# Patient Record
Sex: Female | Born: 1955 | Race: White | Hispanic: No | Marital: Married | Smoking: Former smoker
Health system: Southern US, Community
[De-identification: ages and names within clinical notes are randomized; demographics above are authoritative.]

## PROBLEM LIST (undated history)

## (undated) DIAGNOSIS — K589 Irritable bowel syndrome without diarrhea: Secondary | ICD-10-CM

## (undated) DIAGNOSIS — J302 Other seasonal allergic rhinitis: Secondary | ICD-10-CM

## (undated) DIAGNOSIS — Z90721 Acquired absence of ovaries, unilateral: Secondary | ICD-10-CM

## (undated) DIAGNOSIS — Z90710 Acquired absence of both cervix and uterus: Secondary | ICD-10-CM

## (undated) DIAGNOSIS — R112 Nausea with vomiting, unspecified: Secondary | ICD-10-CM

## (undated) DIAGNOSIS — Z9889 Other specified postprocedural states: Secondary | ICD-10-CM

## (undated) DIAGNOSIS — D219 Benign neoplasm of connective and other soft tissue, unspecified: Secondary | ICD-10-CM

## (undated) DIAGNOSIS — K635 Polyp of colon: Secondary | ICD-10-CM

## (undated) DIAGNOSIS — C4491 Basal cell carcinoma of skin, unspecified: Secondary | ICD-10-CM

## (undated) DIAGNOSIS — K5790 Diverticulosis of intestine, part unspecified, without perforation or abscess without bleeding: Secondary | ICD-10-CM

## (undated) DIAGNOSIS — C801 Malignant (primary) neoplasm, unspecified: Secondary | ICD-10-CM

## (undated) HISTORY — DX: Diverticulosis of intestine, part unspecified, without perforation or abscess without bleeding: K57.90

## (undated) HISTORY — PX: EYE SURGERY: SHX253

## (undated) HISTORY — PX: WISDOM TOOTH EXTRACTION: SHX21

## (undated) HISTORY — PX: COLONOSCOPY: SHX174

## (undated) HISTORY — DX: Basal cell carcinoma of skin, unspecified: C44.91

## (undated) HISTORY — PX: ABDOMINAL HYSTERECTOMY: SHX81

## (undated) HISTORY — DX: Polyp of colon: K63.5

## (undated) HISTORY — PX: DILATION AND CURETTAGE OF UTERUS: SHX78

## (undated) HISTORY — DX: Irritable bowel syndrome, unspecified: K58.9

---

## 1998-07-17 ENCOUNTER — Other Ambulatory Visit: Admission: RE | Admit: 1998-07-17 | Discharge: 1998-07-17 | Payer: Self-pay | Admitting: *Deleted

## 1998-11-15 ENCOUNTER — Observation Stay (HOSPITAL_COMMUNITY): Admission: RE | Admit: 1998-11-15 | Discharge: 1998-11-15 | Payer: Self-pay | Admitting: *Deleted

## 1999-05-28 ENCOUNTER — Other Ambulatory Visit: Admission: RE | Admit: 1999-05-28 | Discharge: 1999-05-28 | Payer: Self-pay | Admitting: Obstetrics and Gynecology

## 1999-09-04 ENCOUNTER — Emergency Department (HOSPITAL_COMMUNITY): Admission: EM | Admit: 1999-09-04 | Discharge: 1999-09-04 | Payer: Self-pay | Admitting: Emergency Medicine

## 2000-06-02 ENCOUNTER — Other Ambulatory Visit: Admission: RE | Admit: 2000-06-02 | Discharge: 2000-06-02 | Payer: Self-pay | Admitting: Obstetrics and Gynecology

## 2001-07-01 ENCOUNTER — Other Ambulatory Visit: Admission: RE | Admit: 2001-07-01 | Discharge: 2001-07-01 | Payer: Self-pay | Admitting: Obstetrics and Gynecology

## 2002-06-14 ENCOUNTER — Encounter: Payer: Self-pay | Admitting: Obstetrics and Gynecology

## 2002-06-14 ENCOUNTER — Ambulatory Visit (HOSPITAL_COMMUNITY): Admission: RE | Admit: 2002-06-14 | Discharge: 2002-06-14 | Payer: Self-pay | Admitting: Obstetrics and Gynecology

## 2002-06-22 ENCOUNTER — Other Ambulatory Visit: Admission: RE | Admit: 2002-06-22 | Discharge: 2002-06-22 | Payer: Self-pay | Admitting: Obstetrics and Gynecology

## 2003-03-23 ENCOUNTER — Encounter (INDEPENDENT_AMBULATORY_CARE_PROVIDER_SITE_OTHER): Payer: Self-pay

## 2003-03-23 ENCOUNTER — Ambulatory Visit (HOSPITAL_COMMUNITY): Admission: RE | Admit: 2003-03-23 | Discharge: 2003-03-23 | Payer: Self-pay | Admitting: Obstetrics and Gynecology

## 2003-06-21 ENCOUNTER — Ambulatory Visit (HOSPITAL_COMMUNITY): Admission: RE | Admit: 2003-06-21 | Discharge: 2003-06-21 | Payer: Self-pay | Admitting: Obstetrics and Gynecology

## 2003-10-25 ENCOUNTER — Other Ambulatory Visit: Admission: RE | Admit: 2003-10-25 | Discharge: 2003-10-25 | Payer: Self-pay | Admitting: Obstetrics and Gynecology

## 2003-12-07 ENCOUNTER — Ambulatory Visit (HOSPITAL_COMMUNITY): Admission: RE | Admit: 2003-12-07 | Discharge: 2003-12-07 | Payer: Self-pay | Admitting: Gastroenterology

## 2004-07-09 ENCOUNTER — Ambulatory Visit (HOSPITAL_COMMUNITY): Admission: RE | Admit: 2004-07-09 | Discharge: 2004-07-09 | Payer: Self-pay | Admitting: Obstetrics and Gynecology

## 2005-08-10 ENCOUNTER — Ambulatory Visit (HOSPITAL_COMMUNITY): Admission: RE | Admit: 2005-08-10 | Discharge: 2005-08-10 | Payer: Self-pay | Admitting: Obstetrics and Gynecology

## 2006-08-17 ENCOUNTER — Ambulatory Visit (HOSPITAL_COMMUNITY): Admission: RE | Admit: 2006-08-17 | Discharge: 2006-08-17 | Payer: Self-pay | Admitting: Obstetrics and Gynecology

## 2006-09-22 ENCOUNTER — Ambulatory Visit (HOSPITAL_COMMUNITY): Admission: RE | Admit: 2006-09-22 | Discharge: 2006-09-22 | Payer: Self-pay | Admitting: Gastroenterology

## 2006-09-22 ENCOUNTER — Encounter (INDEPENDENT_AMBULATORY_CARE_PROVIDER_SITE_OTHER): Payer: Self-pay | Admitting: Specialist

## 2007-02-05 ENCOUNTER — Emergency Department (HOSPITAL_COMMUNITY): Admission: EM | Admit: 2007-02-05 | Discharge: 2007-02-05 | Payer: Self-pay | Admitting: Emergency Medicine

## 2007-07-20 ENCOUNTER — Encounter (INDEPENDENT_AMBULATORY_CARE_PROVIDER_SITE_OTHER): Payer: Self-pay | Admitting: Obstetrics and Gynecology

## 2007-07-20 ENCOUNTER — Ambulatory Visit (HOSPITAL_COMMUNITY): Admission: RE | Admit: 2007-07-20 | Discharge: 2007-07-20 | Payer: Self-pay | Admitting: Vascular Surgery

## 2007-08-31 ENCOUNTER — Ambulatory Visit (HOSPITAL_COMMUNITY): Admission: RE | Admit: 2007-08-31 | Discharge: 2007-08-31 | Payer: Self-pay | Admitting: Obstetrics and Gynecology

## 2008-03-21 ENCOUNTER — Encounter: Admission: RE | Admit: 2008-03-21 | Discharge: 2008-04-24 | Payer: Self-pay | Admitting: Internal Medicine

## 2008-09-04 ENCOUNTER — Ambulatory Visit (HOSPITAL_COMMUNITY): Admission: RE | Admit: 2008-09-04 | Discharge: 2008-09-04 | Payer: Self-pay | Admitting: Obstetrics and Gynecology

## 2009-09-06 ENCOUNTER — Ambulatory Visit (HOSPITAL_COMMUNITY): Admission: RE | Admit: 2009-09-06 | Discharge: 2009-09-06 | Payer: Self-pay | Admitting: Obstetrics and Gynecology

## 2010-05-06 ENCOUNTER — Ambulatory Visit: Payer: Self-pay | Admitting: Sports Medicine

## 2010-05-06 DIAGNOSIS — M2020 Hallux rigidus, unspecified foot: Secondary | ICD-10-CM | POA: Insufficient documentation

## 2010-05-06 DIAGNOSIS — M79609 Pain in unspecified limb: Secondary | ICD-10-CM | POA: Insufficient documentation

## 2010-05-06 DIAGNOSIS — R269 Unspecified abnormalities of gait and mobility: Secondary | ICD-10-CM | POA: Insufficient documentation

## 2010-06-11 ENCOUNTER — Ambulatory Visit: Payer: Self-pay | Admitting: Sports Medicine

## 2010-07-23 ENCOUNTER — Ambulatory Visit (HOSPITAL_COMMUNITY)
Admission: RE | Admit: 2010-07-23 | Discharge: 2010-07-23 | Payer: Self-pay | Source: Home / Self Care | Attending: Obstetrics and Gynecology | Admitting: Obstetrics and Gynecology

## 2010-08-21 ENCOUNTER — Other Ambulatory Visit (HOSPITAL_COMMUNITY): Payer: Self-pay | Admitting: Obstetrics and Gynecology

## 2010-08-21 DIAGNOSIS — Z0000 Encounter for general adult medical examination without abnormal findings: Secondary | ICD-10-CM

## 2010-09-02 NOTE — Assessment & Plan Note (Signed)
Summary: FEET PAIN,MC   Vital Signs:  Patient profile:   55 year old female Height:      66 inches Weight:      153 pounds BMI:     24.78 Pulse rate:   74 / minute BP sitting:   129 / 86  (right arm)  Vitals Entered By: Rochele Pages RN (May 06, 2010 9:59 AM)  History of Present Illness: Pleasant lady who has found running to be her most efficient sport Hx of doing a lot of biking started running and had better fitness and weight loss at first tried up to 6 mi but had leg fatigue for 2 to 3 days p long runs now does 3 mi about 6x per week  major issue is RT great toe pain some but not as much on left  comes for evalutioon of this  Physical Exam  General:  Well-developed,well-nourished,in no acute distress; alert,appropriate and cooperative throughout examination Msk:  RT midfoot has moderate pronation at rest shift is at midfoot rear foot is normal early bunion on RT with limited toe dorsiflexion to 20 deg and PF to 20 deg left great toe shows 45 deg of ext and 35 deg of flex left great toe also shows only mild deviation at 1 MTP Rt MTP1 shows abnormal callus along side  walking gait is abnormal with pronation Rt > LT  hip strength good quad strength good leg lennght equal alignment good   Impression & Recommendations:  Problem # 1:  FOOT PAIN, RIGHT (ICD-729.5) primarily from great toe no PF or general arch pain long arch collapse on RT and some early transverse  trial of top ketoprofen gel to great toe MTP  Problem # 2:  HALLUX RIGIDUS, ACQUIRED (ICD-735.2) work motion nees to use custom orthotic to prevent bunion   Problem # 3:  ABNORMALITY OF GAIT (ICD-781.2) must try to control deg of pronation if she is to cont succesful running  plan to bring shoes, running shorts, do gait eval probalby custom orthotics  Complete Medication List: 1)  Ketoprofen 20% Gel  .... Aaa qid  Patient Instructions: 1)  [facility]* 2)  [address])  [place] 4)  [place], [place]  [address]

## 2010-09-02 NOTE — Assessment & Plan Note (Signed)
Summary: ORTHOTICS,MC   Vital Signs:  Patient profile:   55 year old female BP sitting:   134 / 78  Vitals Entered By: Lillia Pauls CMA (June 11, 2010 5:08 PM)   History of Present Illness: [name] returns for foot and toe pain ketoprofen gel really helped grt toe pain runs regularly gets foot pain over medial area and bunion on RT  wants to try custom orthotics to lessen pain  Preventive Screening-Counseling & Management  Alcohol-Tobacco     Smoking Status: never  Allergies (verified): No Known Drug Allergies  Social History: Smoking Status:  never  Physical Exam  General:  Well-developed,well-nourished,in no acute distress; alert,appropriate and cooperative throughout examination Msk:  mild bunion change on RT mild preservatin of arch  On left there is collapse of long arch pronated at rest  Gait is pronated bilat more midfoot left and forefoot RT   Impression & Recommendations:  Problem # 1:  ABNORMALITY OF GAIT (ICD-781.2)  Patient was fitted for a standard, cushioned, semi-rigid orthotic.  The orthotic was heated and the patient stood on the orthotic blank positioned on the orthotic stand. The patient was positioned in subtalar neutral position and 10 degrees of ankle dorsiflexion in a weight bearing stance. After completion of molding a stable based was applied to the orthotic blank.   The blank was ground to a stable position for weight bearing. size 7 blue swirl base med blue EVA posting  first ray RT additional orthotic padding  none  time 30 mins  gait is neutral w good control of pronation p completion she feels very comforatble walking and running in these  Orders: [employer] Materials, each unit (L3002)  Problem # 2:  FOOT PAIN, RIGHT (ICD-729.5) Assessment: Improved  improved  keep up ketoprofen gel as needed  Orders: [employer]otic [employer], each unit (Z6109)  Problem # 3:  HALLUX RIGIDUS, ACQUIRED (ICD-735.2)  use 1st ray  post  work gentle motion  reck prn  Orders: Games developer, each unit (L3002)  Complete Medication List: 1)  Ketoprofen 20% Gel  .... Aaa qid   Orders Added: 1)  Est. Patient Level III [60454] 2)  Orthotic Materials, each unit [L3002]

## 2010-09-09 ENCOUNTER — Ambulatory Visit (HOSPITAL_COMMUNITY)
Admission: RE | Admit: 2010-09-09 | Discharge: 2010-09-09 | Disposition: A | Discharge status: Home / Self Care | Payer: 59 | Source: Ambulatory Visit | Attending: Obstetrics and Gynecology | Admitting: Obstetrics and Gynecology

## 2010-09-09 ENCOUNTER — Ambulatory Visit (HOSPITAL_COMMUNITY): Admission: RE | Admit: 2010-09-09 | Payer: Self-pay | Source: Home / Self Care | Admitting: Obstetrics and Gynecology

## 2010-09-09 DIAGNOSIS — Z0000 Encounter for general adult medical examination without abnormal findings: Secondary | ICD-10-CM

## 2010-09-09 DIAGNOSIS — Z1231 Encounter for screening mammogram for malignant neoplasm of breast: Secondary | ICD-10-CM | POA: Insufficient documentation

## 2010-10-03 NOTE — Op Note (Signed)
NAMEPANAGIOTA, PERFETTI                ACCOUNT NO.:  [id number]  MEDICAL RECORD NO.:  [id number]          PATIENT TYPE:  AMB  LOCATION:  SDC                           [facility]  PHYSICIAN:  Annye Forrey A. Naria Abbey, M.D.DATE OF BIRTH:  Jul 30, 1956  DATE OF PROCEDURE:  07/23/2010 DATE OF DISCHARGE:  07/23/2010                              OPERATIVE REPORT   PREOPERATIVE DIAGNOSES:  Postmenopausal bleeding and thickened endometrium.  POSTOPERATIVE DIAGNOSIS:  Postmenopausal bleeding and thickened endometrium.  PROCEDURES:  Dilation and curettage and hysteroscopy.  SURGEON:  Ashlen Kiger A. Rosalio Macadamia, M.D.  ANESTHESIA:  General.  INDICATIONS:  This is a 55 year old G1, P1-0-0-1 woman who has had no vaginal bleeding until she changed her hormones, this past year.  She has had 2 episodes of bright red bleeding in the past 4 months.  The patient has had a previous D and C hysteroscopy with resectoscope x2 and has had known fibroids during these procedures.  The patient had a sonohysterogram which revealed a thickened area on her endometrium for which she is brought to the operating room today for a D and C hysteroscopy with resectoscope again.  FINDINGS:  Retroverted to mid position uterus with some irregularity consistent with fibroids.  No adnexal mass.  Endometrium with a thickened area on the anterior wall but everything consistent with benign tissue.  PROCEDURE:  The patient was brought into the operating room, given adequate general anesthesia.  She was placed in a dorsal lithotomy position.  Her perineum was washed with Betadine and vagina was washed with Betadine.  She was draped in sterile fashion.  Speculum was placed in the vagina.  Paracervical block was administered with 1% Nesacaine. Anterior lip of the cervix was grasped with a single-tooth tenaculum. Cervix was sounded.  Cervix was dilated with Shawnie Pons dilators initially to approximately 20 to 23.  A hysteroscope was  introduced into the endometrial cavity and was brought up to the top of the uterus and the corners could be visualized when this was done, the anterior portion of the uterus was seen to be irregular with some thickening.  The decision was made to use a resectoscope.  Therefore, this simple hysteroscope was removed.  The resectoscope was placed and the cervix was dilated a little bit more.  The small resectoscope was then introduced into the endometrial cavity.  However during this period of time, there was some bleeding within the endometrial cavity from the previous hysteroscopy and the blood clots were attempted to be removed through just flushing it out with the fluid and trying to remove it with the hysteroscope, however, the blood clot could never totally be removed.  There was not good visualization to use the resectoscope at any time.  Therefore, it was felt that it was not safe to use the resectoscope.  Therefore, the resectoscope was removed and a sharp curettage was performed instead. There was no large amount of tissue.  There was only small amount of tissue removed and this tissue was sent to pathology.  All instruments were removed from the vagina.  The patient was taken out of the dorsal lithotomy position.  She was awakened.  She was moved from the operating table to a stretcher in stable condition.  Complications were none. Estimated blood loss less than 5 mL.  Complications none.  Glycol differential approximately 200 mL.     Arneda Sappington A. Rosalio Macadamia, M.D.     SAD/MEDQ  D:  07/23/2010  T:  07/24/2010  Job:  [id number]  Electronically Signed by Floyde Parkins M.D. on 10/03/2010 12:35:38 PM

## 2010-10-13 LAB — CBC
HCT: 39.1 % (ref 36.0–46.0)
Hemoglobin: 13 g/dL (ref 12.0–15.0)
MCH: 29.6 pg (ref 26.0–34.0)
MCHC: 33.2 g/dL (ref 30.0–36.0)
MCV: 89.1 fL (ref 78.0–100.0)
Platelets: 266 10*3/uL (ref 150–400)
RBC: 4.39 MIL/uL (ref 3.87–5.11)
RDW: 12.9 % (ref 11.5–15.5)
WBC: 5.1 10*3/uL (ref 4.0–10.5)

## 2010-10-22 ENCOUNTER — Ambulatory Visit (INDEPENDENT_AMBULATORY_CARE_PROVIDER_SITE_OTHER): Payer: 59 | Admitting: Sports Medicine

## 2010-10-22 ENCOUNTER — Encounter: Payer: Self-pay | Admitting: Sports Medicine

## 2010-10-22 VITALS — BP 120/78 | Ht 66.0 in | Wt 157.0 lb

## 2010-10-22 DIAGNOSIS — M79609 Pain in unspecified limb: Secondary | ICD-10-CM

## 2010-10-22 DIAGNOSIS — R269 Unspecified abnormalities of gait and mobility: Secondary | ICD-10-CM

## 2010-10-22 DIAGNOSIS — M2020 Hallux rigidus, unspecified foot: Secondary | ICD-10-CM

## 2010-10-22 NOTE — Assessment & Plan Note (Signed)
Since most of her pain is over her first MTP joint I modified her orthotic to take and cut down on the degree of padding under this joint. Once that was completed she felt that this was more comfortable with running and she had less tendency to limp on push off of her right foot.

## 2010-10-22 NOTE — Progress Notes (Signed)
  Subjective:    Patient ID: [name], female    DOB: May 31, 1956, 55 y.o.   MRN: [id number]  HPI  this patient received orthotics in October. They were done because she has significant hallux rigidus. Most of her pain is in her right foot that she had some in both feet. She has become a runner and is enjoying the training period recently she decided to run a half marathon and when she started increasing her mileage the pain in her right great toe area has increased.   Currently she can run up to 4 miles without a lot of pain but now that she has gotten her long run up to 9 miles she'll have a lot of pain in her right great. Soaking in hot water has been helpful. She has been using topical ketoprofen gel and has also used both Mobic and ibuprofen with some relief of pain.   Of note on her right orthotic we had placed a first Ray post.    Review of Systems     Objective:   Physical Exam  the patient has preservation of her long arch bilaterally. On the right foot there is a moderate bunion with hypertrophic change at the first MTP joint. The left foot has only very minimal change at the first MTP joint.   Today there is no tenderness to palpation. However on the right foot she has only about 20 of extension and 25 of flexion. On the left foot she has 30 of extension and 35 of flexion. These are similar to measurements from her last visit.    her walking and running gait reveals pronation at the forefoot when she is barefoot. In her orthotics she corrects the pronation and has a neutral walking and running gait.       Assessment & Plan:          Problem List as of 10/22/2010        FOOT PAIN, RIGHT   Last Assessment & Plan Note   10/22/2010 Office Visit Signed 10/22/2010  2:45 PM by Enid Baas     Since most of her pain is over her first MTP joint I modified her orthotic to take and cut down on the degree of padding under this joint. Once that was completed she felt that this  was more comfortable with running and she had less tendency to limp on push off of her right foot.    HALLUX RIGIDUS, ACQUIRED   Last Assessment & Plan Note   10/22/2010 Office Visit Signed 10/22/2010  2:46 PM by Enid Baas     She should continue to work on her range of motion and was given a series of exercises of walking on her toes heels and backwards to help stretch out the first MTP joints bilaterally. In addition after each run she should soak the right first MTP and his water for 5-10 minutes    ABNORMALITY OF GAIT   Last Assessment & Plan Note   10/22/2010 Office Visit Signed 10/22/2010  2:51 PM by Enid Baas     She should continue to use the orthotics as they did correct her gait problems. She will try the modification we have made today and if this works well we'll continue to use the orthotic as is. Should her pain return she'll return to our clinic for further modification

## 2010-10-22 NOTE — Assessment & Plan Note (Signed)
She should continue to work on her range of motion and was given a series of exercises of walking on her toes heels and backwards to help stretch out the first MTP joints bilaterally. In addition after each run she should soak the right first MTP and his water for 5-10 minutes

## 2010-10-22 NOTE — Assessment & Plan Note (Signed)
She should continue to use the orthotics as they did correct her gait problems. She will try the modification we have made today and if this works well we'll continue to use the orthotic as is. Should her pain return she'll return to our clinic for further modification

## 2010-12-16 NOTE — Op Note (Signed)
[name], [name]                ACCOUNT NO.:  [id number]   MEDICAL RECORD NO.:  [id number]          PATIENT TYPE:  AMB   LOCATION:  SDC                           F[facility]ILITY:  [facility]   PHYSICIAN:  Sherry A. Dickstein, M.D.DATE OF BIRTH:  Apr 12, 1956   DATE OF PROCEDURE:  07/20/2007  DATE OF DISCHARGE:                               OPERATIVE REPORT   PREOPERATIVE DIAGNOSES:  1. Postmenopausal bleeding.  2. Submucosal fibroids.   POSTOPERATIVE DIAGNOSES:  1. Postmenopausal bleeding.  2. Submucosal fibroids.   PROCEDURE:  D&C, hysteroscopy with resectoscope.   SURGEON:  Sherry A. Rosalio Macadamia, M.D.   ANESTHESIA:  General.   INDICATIONS FOR PROCEDURE:  This is a 55 year old G1, P1-0-0-1 woman who  was started on hormone replacement therapy after which she started to  have some irregular bleeding.  The patient had an ultrasound which  revealed presumed fibroids.  She came for a sonohistogram which revealed  submucosal fibroids.  The patient gives a history of excessive flow with  her menstrual periods when she was having regular periods prior to  menopause.  The patient has had known fibroids in the past with previous  submucosal fibroids and resections of submucosal fibroids approximately  3 years ago and approximately 8 years ago.  The patient is not  interested in having a hysterectomy and would like to have a repeat D&C,  hysteroscopy with the resectoscope.  The patient understands that she  has an increased risk of perforation, having had this done several times  in the past.   FINDINGS:  Anteflexed, slightly enlarged uterus with no adnexal mass.  Submucosal fibroids present and very small endometrial polyps present.   DESCRIPTION OF PROCEDURE:  The patient was brought into the operating  room and given adequate general anesthesia.  She was placed in the  dorsal lithotomy position.  Her perineum and then her vagina were washed  with Betadine.  A paracervical block was  administered with 1% Nesacaine.  The anterior lip of the cervix was grasped wit a single-tooth tenaculum.  The cervix was infiltrated with Pitressin in a 20 units to 50 mL  dilution, and 20 mL of this dilution was used.  The cervix was then  dilated with Shawnie Pons dilators to a #31.  The hysteroscope was attempted to  be introduced, but there was some difficulty introducing it, and then  the cervix was dilated to a #33.  The hysteroscope was then easily  introduced.  Pictures were obtained.  A large submucosal fibroid was  visualized.  Using a single loop resector, the fibroid was removed in  sheets of tissue.  After each cautery, the large sheet of tissue was  attempted to be removed individually.  There were some endometrial  polyps present.  These were very small, but they were removed in  sections.  There was another small submucosal fibroid present that was  removed separately.  Once all the tissue was removed and it was felt  that no further tissue could be removed safely, pictures were obtained.  Adequate hemostasis was present.  There was no known perforation  performed during this procedure.  All tissue had been removed from the  uterus.  All instruments were removed from the vagina.   The patient was taken out of the dorsal lithotomy position.  She was  awakened.  She was extubated.  She was moved from the operating table to  a stretcher in stable condition.   COMPLICATIONS:  None.   ESTIMATED BLOOD LOSS:  Less than 5 mL.   SORBITOL DIFFERENTIAL:  Less than 100 mL.   SPECIMENS:  1. Endometrial resections.  2. Endometrial polyps which went to pathology.      Sherry A. Rosalio Macadamia, M.D.  Electronically Signed     SAD/MEDQ  D:  07/20/2007  T:  07/21/2007  Job:  [id number]

## 2010-12-19 NOTE — H&P (Signed)
NAME:  [name], [name]                          ACCOUNT NO.:  [id number]   MEDICAL RECORD NO.:  [id number]                   PATIENT TYPE:  AMB   LOCATION:  SDC                                  [facility]   PHYSICIAN:  Richardean Sale, M.D.                DATE OF BIRTH:  May 19, 1956   DATE OF ADMISSION:  03/23/2003  DATE OF DISCHARGE:                                HISTORY & PHYSICAL   CHIEF COMPLAINT:  Irregular bleeding and heavy menses.   HISTORY OF PRESENT ILLNESS:  This is a 55 year old, gravida 1, para 1, white  female with a complaint of heavy irregular bleeding. The patient states that  she has a known history of fibroids and approximately three months ago,  noted that her periods were becoming irregular and they were increasing in  flow and lasting up to seven days. 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   She has had no problem with her periods up until the past three months. The  patient underwent endometrial biopsy which was negative for atypia or  hyperplasia, and also underwent a sonohysterogram which revealed an  endometrial polyp measuring 2 x 1.7 cm and submucous fibroid 1.7 x 1.3 cm as  well as multiple intramural fibroids.   PAST MEDICAL HISTORY:  1. Uterine fibroids.  2. Seasonal allergies.   PAST SURGICAL HISTORY:  Hysteroscopic resection of leiomyoma in April 2000.   SOCIAL HISTORY:  She is married. No use of tobacco or drugs. Occasionally  drinks alcohol socially. Exercises regularly.   FAMILY HISTORY:  Maternal grandfather with cardiovascular disease. Maternal  grandmother with diabetes. No history of breast, uterine, or ovarian  cancers.   REVIEW OF SYSTEMS:  All negative with the exception as noted in the HPI.   PHYSICAL EXAMINATION:  VITAL SIGNS:  Blood pressure 110/60, pulse 76. Last  menstrual period March 06, 2003.  GENERAL:  She is a well-developed, well-nourished white female in no acute  distress who appears her stated age.  HEART:  Regular.  LUNGS:  Clear to auscultation bilaterally.  ABDOMEN:  Soft. Nontender and nondistended with no masses, no hernia. Liver  and spleen are normal.  PELVIC:  Cervix appears normal. It is mobile and nontender. Uterus is in the  midline and anteverted, approximately 8-10 week size. No adnexal masses are  appreciated. Bladder, urethra, and urethral meatus are all normal.   MEDICATIONS:  1. Allegra daily.  2. Vioxx p.r.n. back pain.  3. Agestin 5 mg p.o. daily.   ALLERGIES:  None.   ASSESSMENT:  This is a 55 year old white female with menometorrhagia,  anemia, submucosal fibroids, and possible endometrial polyp on  sonohysterogram with negative endometrial biopsy.   PLAN:  Will proceed with hysteroscopy with resection of endometrial polyps  and/or submucosal fibroids. The  risks, benefits, and alternatives of the  procedure were reviewed with the patient. Informed consent was obtained. The  patient understands that there is a risk of uterine perforation, requiring a  transfusion for significant bleeding, cessation of the hysteroscopy and  possible laparotomy to evaluate for damage to bowel or bladder should  perforation occur. She also understands the risks of infection and  anesthetic related complications. The patient voices understanding of these  risks and agrees to proceed.                                               Richardean Sale, M.D.    JW/MEDQ  D:  03/20/2003  T:  03/20/2003  Job:  [id number]

## 2010-12-19 NOTE — Op Note (Signed)
NAME:  [name], [name]                          ACCOUNT NO.:  [id number]   MEDICAL RECORD NO.:  [id number]                   PATIENT TYPE:  AMB   LOCATION:  ENDO                                 FACILITY:  [facility]   PHYSICIAN:  Anselmo Rod, M.D.               DATE OF BIRTH:  August 09, 1955   DATE OF PROCEDURE:  12/07/2003  DATE OF DISCHARGE:                                 OPERATIVE REPORT   PROCEDURE PERFORMED:  Colonoscopy.   ENDOSCOPIST:  Anselmo Rod, M.D.   INSTRUMENT USED:  Olympus video colonoscope.   INDICATIONS FOR PROCEDURE:  A 55 year old white female with a history of  rectal bleeding, rule out colonic polyps, masses, etc.   PREPROCEDURE PREPARATION:  Informed consent was procured from the patient.  The patient was fasted for 8 hours prior to the procedure and prepped with a  bottle of magnesium citrate and a gallon of GoLYTELY the night prior to the  procedure.   PREPROCEDURE PHYSICAL EXAMINATION:  VITAL SIGNS:  The patient with stable  vital signs.  NECK:  Supple.  CHEST:  Clear to auscultation.  CARDIAC:  S1, S2, regular.  ABDOMEN:  Soft with normal bowel sounds.   DESCRIPTION OF PROCEDURE:  The patient was placed in the left lateral  decubitus position, sedated with 100 mg of Demerol and 10 mg of Versed in  slow incremental doses.  Once the patient was adequately sedated and  maintained on low-flow oxygen and continuous cardiac monitoring, the Olympus  video colonoscope was advanced from the rectum to the cecum.  The  appendiceal orifice and the ileocecal valve were clearly visualized and  photographed.  The terminal ileum appeared healthy and without lesion.  Small internal hemorrhoids were seen on retroflexion.  No masses, polyps,  erosions, ulcerations, or diverticula were seen.  The patient had some  abdominal discomfort with insufflation of air into the colon, indicating  component of visceral hypersensitivity.  The patient tolerated the procedure  well  without immediate complications.   IMPRESSION:  1. Normal colonoscopy up to the terminal ileum, except for small internal     hemorrhoids.  2. No masses, polyps, or diverticulosis seen.  3. Abdominal discomfort with insufflation of air into the colon indicating a     component of visceral hypersensitivity.   RECOMMENDATIONS:  1. Continue a high fiber diet with liberal fluid intake.  2. Outpatient follow-up in the next 2 weeks for further recommendations.  3. Repeat colorectal screening in the next 10 years unless the patient     develops any abnormal symptoms in the interim.                                               Anselmo Rod, M.D.    JNM/MEDQ  D:  12/08/2003  T:  12/09/2003  Job:  [id number]   cc:   Larina Earthly, M.D.  [address]  [place]  [place] [address]  Fax: [phone]

## 2010-12-19 NOTE — Op Note (Signed)
NAMELAURENCIA, [name]                ACCOUNT NO.:  [id number]   MEDICAL RECORD NO.:  [id number]          PATIENT TYPE:  AMB   LOCATION:  ENDO                         FACILITY:  [facility]   PHYSICIAN:  Anselmo Rod, M.D.  DATE OF BIRTH:  Aug 01, 1956   DATE OF PROCEDURE:  09/22/2006  DATE OF DISCHARGE:                               OPERATIVE REPORT   PROCEDURE PERFORMED:  Colonoscopy with cold biopsy times one and snare  polypectomy times one.   ENDOSCOPIST:  Anselmo Rod, M.D.   INSTRUMENT USED:  Pentax video colonoscope.   INDICATIONS FOR PROCEDURE:  55 year old white female with a family  history of colon cancer in her mother undergoing colonoscopy for ongoing  rectal bleeding, rule out colonic polyps, masses, etc.   PREPROCEDURE PREPARATION:  Informed consent was procured from the  patient.  The patient fasted for 4 hours prior to the procedure and  prepped with 20 Osmoprep pills the night of and 12 Osmoprep pills the  morning of the procedure. The risks and benefits of the procedure  including a 10% miss rate of cancer and polyp were discussed with her as  well.   PREPROCEDURE PHYSICAL:  The patient had stable vital signs.  Neck  supple.  Chest clear to auscultation.  S1, S2 regular.  Abdomen soft  with normal bowel sounds.   DESCRIPTION OF PROCEDURE:  The patient was placed in left lateral  decubitus position and sedated with 125 mcg of fentanyl and 10 mg of  Versed given intravenously in slow incremental doses.  Once the patient  was adequately sedated and maintained on low-flow oxygen and continuous  cardiac monitoring, the Pentax video colonoscope was advanced from the  rectum to the cecum.  Multiple washes were done.  The appendiceal  orifice and cecum were clearly visualized and photographed.  The  terminal ileum appeared healthy without lesions.  A small sessile polyp  was biopsied from the cecum (cold biopsies times one).  Another small  polyp 3-4 mm in size,  sessile in nature, was snared from 20 cm. There  was no evidence of hemorrhoids or anal fissures, perianal erosions were  noted of unclear significance. The patient had significant abdominal  discomfort with insufflation of air into the colon indicating a  component of visceral hypersensitivity consistent with IBS. The patient  tolerated the procedure well without complications.   IMPRESSION:  1. Perianal erosions.  2. Small sessile polyp removed by hot snare from 20 cm and another      small sessile polyp biopsied in the cecum (cold biopsies X 1).  3. No evidence of diverticulosis.  4. No hemorrhoids or fissures seen.  5. Visceral hypersensitivity appreciated with insufflation of air into      the colon consistent with IBS.  6. Normal terminal ileum.   RECOMMENDATIONS:  1. Await pathology results.  2. Avoid all nonsteroidals including aspirin for the next 2 weeks.  3. Repeat colonoscopy depending on pathology results.  4. Acyclovir cream has been called into the patient's pharmacy for      rectal application two to three times  a day.  She is to apply this      externally and follow-up in the office in the next 2 weeks.  Her      response to this will be monitored and further recommendations made      accordingly.      Anselmo Rod, M.D.  Electronically Signed     JNM/MEDQ  D:  09/22/2006  T:  09/22/2006  Job:  [id number]   cc:   Adolph Pollack, M.D.  Richardean Sale, M.D.  Larina Earthly, M.D.

## 2010-12-19 NOTE — Op Note (Signed)
NAME:  [name], [name]                          ACCOUNT NO.:  [id number]   MEDICAL RECORD NO.:  [id number]                   PATIENT TYPE:  AMB   LOCATION:  SDC                                  FACILITY:  [facility]   PHYSICIAN:  Richardean Sale, M.D.                DATE OF BIRTH:  1955/10/29   DATE OF PROCEDURE:  03/23/2003  DATE OF DISCHARGE:                                 OPERATIVE REPORT   PREOPERATIVE DIAGNOSES:  1. Menometrorrhagia.  2. Submucosal uterine fibroid, possible endometrial polyp.   POSTOPERATIVE DIAGNOSES:  1. Menometrorrhagia.  2. Submucosal fibroids x2.   PROCEDURE:  Hysteroscopy with resection of submucosal fibroid.   SURGEON:  Richardean Sale, M.D.   ASSISTANT:  Cordelia Pen A. Rosalio Macadamia, M.D.   ANESTHESIA:  General.   COMPLICATIONS:  None.   ESTIMATED BLOOD LOSS:  Minimal.   FLUID DEFICIT:  400 mL.   FINDINGS:  Two 2-cm submucosal uterine fibroids, one on the anterior and  right aspect of the uterine cavity and the other on the left lateral aspect  of the uterine cavity.   PATHOLOGY SPECIMENS:  Resected uterine fibroids.   INDICATIONS FOR PROCEDURE:  This is a 55 year old white female with a three  month history of menometrorrhagia with anemia with hemoglobin down to 10.  The patient had had uterine fibroids in the past and underwent endometrial  biopsy which was negative for any atypia or hyperplasia or malignancy and  then subsequently underwent a sonohysterogram which revealed submucosal  fibroid.  Given the patient's anemia and her significant bleeding, we  discussed her options and she elected to proceed with hysteroscopic  resection of the fibroid.  Prior to the procedure the risks and benefits of  the procedure including, but not limited to, uterine perforation requiring  laparoscopy, possible damage to the bowel or bladder or other intra-  abdominal organs, possible hemorrhage necessitating transfusion, infection,  or anesthetic risk.  The patient  voiced understanding of all these risks,  consent was signed, and she agreed to proceed.  All questions were answered.   PROCEDURE:  The patient was taken in the operating room and given general  anesthesia, was prepped and draped in the usual sterile fashion, and placed  in the dorsal lithotomy position.  A bivalve speculum was then placed in the  vagina and 2 mL of Nesacaine was injected at the 12 o'clock position.  A  single-tooth tenaculum was then used to grasp the anterior lip of the cervix  and additional Nesacaine was used at the 4 o'clock, 5 o'clock, 7 o'clock,  and 8 o'clock positions with 6 mL injected in each site along with  cervicovaginal junction for postoperative analgesia.  The uterus was then  sounded to 9 cm.  The cervix was then dilated with the [facility] dilators and  the hysteroscope was introduced.  Findings included the two 2-cm submucosal  fibroids.  These were then  resected with the resectoscope with sorbitol used  to distend the uterine cavity.  During the procedure the plastic guard over  the end of the hysteroscope became dislodged.  This was very easily removed  from the uterine cavity with the resectoscope.  At this point a new  resectoscope was then used for the remaining portion of the procedure.  The  remaining portion of the fibroids were resected without difficulty.  The  uterine cavity was then explored.  There was no evidence of any active  bleeding.  All instruments were then removed from the patient's vagina.  The  cervix was inspected and there was very minimal bleeding noted from the  tenaculum site.  Total fluid deficit was 400 mL.   The patient tolerated the procedure well.  She was taken out of the dorsal  lithotomy position and awakened from anesthesia and transferred to the  recovery room.  Sponge, lap, needle, and instrument counts were all correct  x2.                                               Richardean Sale, M.D.    JW/MEDQ  D:   03/23/2003  T:  03/24/2003  Job:  [id number]

## 2011-05-08 LAB — CBC
HCT: 35 — ABNORMAL LOW
Hemoglobin: 12.1
MCHC: 34.5
MCV: 88.4
Platelets: 364
RBC: 3.97
RDW: 12.7
WBC: 5.4

## 2011-06-22 ENCOUNTER — Other Ambulatory Visit: Payer: Self-pay | Admitting: *Deleted

## 2011-06-22 MED ORDER — KETOPROFEN POWD: Status: DC

## 2011-07-10 ENCOUNTER — Other Ambulatory Visit: Payer: Self-pay | Admitting: Dermatology

## 2011-08-25 ENCOUNTER — Other Ambulatory Visit (HOSPITAL_COMMUNITY): Payer: Self-pay | Admitting: Obstetrics and Gynecology

## 2011-08-25 DIAGNOSIS — Z1231 Encounter for screening mammogram for malignant neoplasm of breast: Secondary | ICD-10-CM

## 2011-09-21 ENCOUNTER — Ambulatory Visit (HOSPITAL_COMMUNITY)
Admission: RE | Admit: 2011-09-21 | Discharge: 2011-09-21 | Disposition: A | Discharge status: Home / Self Care | Payer: 59 | Source: Ambulatory Visit | Attending: Obstetrics and Gynecology | Admitting: Obstetrics and Gynecology

## 2011-09-21 DIAGNOSIS — Z1231 Encounter for screening mammogram for malignant neoplasm of breast: Secondary | ICD-10-CM | POA: Insufficient documentation

## 2011-09-24 ENCOUNTER — Other Ambulatory Visit: Payer: Self-pay | Admitting: Obstetrics and Gynecology

## 2011-09-24 DIAGNOSIS — R928 Other abnormal and inconclusive findings on diagnostic imaging of breast: Secondary | ICD-10-CM

## 2011-09-25 ENCOUNTER — Ambulatory Visit
Admission: RE | Admit: 2011-09-25 | Discharge: 2011-09-25 | Disposition: A | Discharge status: Home / Self Care | Payer: 59 | Source: Ambulatory Visit | Attending: Obstetrics and Gynecology | Admitting: Obstetrics and Gynecology

## 2011-09-25 DIAGNOSIS — R928 Other abnormal and inconclusive findings on diagnostic imaging of breast: Secondary | ICD-10-CM

## 2011-09-28 ENCOUNTER — Other Ambulatory Visit: Payer: 59

## 2011-10-05 ENCOUNTER — Other Ambulatory Visit: Payer: 59

## 2012-02-15 ENCOUNTER — Encounter (HOSPITAL_COMMUNITY): Admission: RE | Payer: Self-pay | Source: Ambulatory Visit

## 2012-02-15 ENCOUNTER — Ambulatory Visit (HOSPITAL_COMMUNITY): Admission: RE | Admit: 2012-02-15 | Payer: 59 | Source: Ambulatory Visit | Admitting: Gastroenterology

## 2012-02-15 SURGERY — COLONOSCOPY
Anesthesia: Moderate Sedation

## 2012-07-13 ENCOUNTER — Other Ambulatory Visit: Payer: Self-pay

## 2012-08-23 ENCOUNTER — Other Ambulatory Visit: Payer: Self-pay | Admitting: Obstetrics and Gynecology

## 2012-08-23 ENCOUNTER — Other Ambulatory Visit (HOSPITAL_COMMUNITY): Payer: Self-pay | Admitting: Obstetrics and Gynecology

## 2012-08-23 DIAGNOSIS — Z1231 Encounter for screening mammogram for malignant neoplasm of breast: Secondary | ICD-10-CM

## 2012-09-26 ENCOUNTER — Ambulatory Visit: Payer: Self-pay

## 2012-09-27 ENCOUNTER — Ambulatory Visit (HOSPITAL_COMMUNITY): Payer: Self-pay | Attending: Obstetrics and Gynecology

## 2012-10-24 ENCOUNTER — Ambulatory Visit (HOSPITAL_COMMUNITY)
Admission: RE | Admit: 2012-10-24 | Discharge: 2012-10-24 | Disposition: A | Discharge status: Home / Self Care | Payer: 59 | Source: Ambulatory Visit | Attending: Obstetrics and Gynecology | Admitting: Obstetrics and Gynecology

## 2012-10-24 DIAGNOSIS — Z1231 Encounter for screening mammogram for malignant neoplasm of breast: Secondary | ICD-10-CM | POA: Insufficient documentation

## 2013-06-02 ENCOUNTER — Encounter: Payer: Self-pay | Admitting: Family Medicine

## 2013-06-02 ENCOUNTER — Other Ambulatory Visit (INDEPENDENT_AMBULATORY_CARE_PROVIDER_SITE_OTHER): Payer: 59

## 2013-06-02 ENCOUNTER — Ambulatory Visit (INDEPENDENT_AMBULATORY_CARE_PROVIDER_SITE_OTHER): Payer: 59 | Admitting: Family Medicine

## 2013-06-02 VITALS — BP 140/76 | HR 66 | Wt 162.0 lb

## 2013-06-02 DIAGNOSIS — IMO0002 Reserved for concepts with insufficient information to code with codable children: Secondary | ICD-10-CM

## 2013-06-02 DIAGNOSIS — M25519 Pain in unspecified shoulder: Secondary | ICD-10-CM

## 2013-06-02 DIAGNOSIS — M25512 Pain in left shoulder: Secondary | ICD-10-CM

## 2013-06-02 DIAGNOSIS — M7552 Bursitis of left shoulder: Secondary | ICD-10-CM

## 2013-06-02 DIAGNOSIS — M7550 Bursitis of unspecified shoulder: Secondary | ICD-10-CM | POA: Insufficient documentation

## 2013-06-02 DIAGNOSIS — M75100 Unspecified rotator cuff tear or rupture of unspecified shoulder, not specified as traumatic: Secondary | ICD-10-CM

## 2013-06-02 MED ORDER — MELOXICAM 15 MG PO TABS: 15.0000 mg | ORAL_TABLET | Freq: Every day | ORAL | Status: DC

## 2013-06-02 MED ORDER — TRAMADOL HCL 50 MG PO TABS: 50.0000 mg | ORAL_TABLET | Freq: Every evening | ORAL | Status: DC | PRN

## 2013-06-02 NOTE — Patient Instructions (Signed)
Very nice to meet you.  Tell [name] Hi.  Try exercises daily Meloxicam daily for 10 days then as needed.  Tramadol at night if needed Ice 20 minutes 2 times a day Come back in 3 weeks.

## 2013-06-02 NOTE — Assessment & Plan Note (Signed)
Discussed with patient at great length. The deep treatment options and patient did decide an injection.  Procedure: Real-time Ultrasound Guided Injection of left glenohumeral joint Device: GE Logiq E  Ultrasound guided injection is preferred based studies that show increased duration, increased effect, greater accuracy, decreased procedural pain, increased response rate with ultrasound guided versus blind injection.  Verbal informed consent obtained.  Time-out conducted.  Noted no overlying erythema, induration, or other signs of local infection.  Skin prepped in a sterile fashion.  Local anesthesia: Topical Ethyl chloride.  With sterile technique and under real time ultrasound guidance:  Joint visualized.  23g 1  inch needle inserted posterior approach. Pictures taken for needle placement. Patient did have injection of 2 cc of 1% lidocaine, 2 cc of 0.5% Marcaine, and 1cc of Kenalog 40 mg/dL. Completed without difficulty  Pain immediately resolved suggesting accurate placement of the medication.  Advised to call if fevers/chills, erythema, induration, drainage, or persistent bleeding.  Images permanently stored and available for review in the ultrasound unit.  Impression: Technically successful ultrasound guided injection.  Home exercise program given Medications per orders Followup per orders

## 2013-06-02 NOTE — Progress Notes (Signed)
I'm seeing this patient by the request  of:  Hoyle Sauer, MD   CC: Shoulder pain  HPI: Patient is a very pleasant 57 year old female coming in with shoulder pain. Left-sided. Patient states that this is been going on for months. Patient has been doing a boot camp and doing much more exercises recently. Patient notices when she gets stress in the morning or tries to reach behind her back with her left arm she has significant amount of pain. Patient states and has returned across her body concussive pain to. Patient states it is a dull aching sensation with a sharp pain with certain movements. Patient denies any radiation down the arm, any numbness, for any weakness. Patient has tried some over-the-counter medications with minimal benefit. Patient but the severity is 6/10.  Past medical, surgical, family and social history reviewed. Medications reviewed all in the electronic medical record.   Review of Systems: No headache, visual changes, nausea, vomiting, diarrhea, constipation, dizziness, abdominal pain, skin rash, fevers, chills, night sweats, weight loss, swollen lymph nodes, body aches, joint swelling, muscle aches, chest pain, shortness of breath, mood changes.   Objective:    Blood pressure 140/76, pulse 66, weight 162 lb (73.483 kg), SpO2 96.00%.   General: No apparent distress alert and oriented x3 mood and affect normal, dressed appropriately.  HEENT: Pupils equal, extraocular movements intact Respiratory: Patient's speak in full sentences and does not appear short of breath Cardiovascular: No lower extremity edema, non tender, no erythema Skin: Warm dry intact with no signs of infection or rash on extremities or on axial skeleton. Abdomen: Soft nontender Neuro: Cranial nerves II through XII are intact, neurovascularly intact in all extremities with 2+ DTRs and 2+ pulses. Lymph: No lymphadenopathy of posterior or anterior cervical chain or axillae bilaterally.  Gait normal  with good balance and coordination.  MSK: Non tender with full range of motion and good stability and symmetric strength and tone of elbows, wrist, hip, knee and ankles bilaterally.   Shoulder: left shoulder Inspection reveals no abnormalities, atrophy or asymmetry. Palpation is normal with no tenderness over AC joint or bicipital groove. ROM is full in all planes passively but does have pain with extreme internal rotation as well as forward flexion to Rotator cuff strength normal throughout. Positive signs of impingement with negative Neer and Hawkin's tests, but negative empty can sign. Speeds and Yergason's tests normal. No labral pathology noted with negative Obrien's, negative clunk and good stability. Normal scapular function observed. No painful arc and no drop arm sign. No apprehension sign   MSK US performed of: left shoulder This study was ordered, performed, and interpreted by Terrilee Files D.O.  Shoulder:  Left shoulder Supraspinatus: Patient does have mild bursal bulge of the supraspinatus  Infraspinatus:  Appears normal on long and transverse views. Subscapularis:  Does have hypoechoic changes with some swelling. Patient also has a small osteophyte in this area it seems to cause some mild impingement. No true tear appreciated in the tendon.. Teres Minor:  Appears normal on long and transverse views. AC joint:  Capsule distended, . Glenohumeral Joint:  Appears normal without effusion. Glenoid Labrum:  Intact without visualized tears. Biceps Tendon:  Appears normal on long and transverse views, no fraying of tendon, tendon located in intertubercular groove, no subluxation with shoulder internal or external rotation. No increased power doppler signal.  Impression: Subacromial bursitis with osteophyte causing some mild impingement on the subscapularis tendon.   Impression and Recommendations:     This case  required medical decision making of moderate complexity.

## 2013-06-05 ENCOUNTER — Other Ambulatory Visit: Payer: Self-pay | Admitting: Family Medicine

## 2013-06-05 MED ORDER — MELOXICAM 15 MG PO TABS: 15.0000 mg | ORAL_TABLET | Freq: Every day | ORAL | Status: DC

## 2013-06-23 ENCOUNTER — Ambulatory Visit: Payer: 59 | Admitting: Family Medicine

## 2013-08-28 ENCOUNTER — Encounter (HOSPITAL_COMMUNITY): Payer: Self-pay | Admitting: Pharmacist

## 2013-08-29 ENCOUNTER — Other Ambulatory Visit: Payer: Self-pay | Admitting: Obstetrics & Gynecology

## 2013-08-30 ENCOUNTER — Encounter: Payer: Self-pay | Admitting: Sports Medicine

## 2013-08-30 ENCOUNTER — Ambulatory Visit (INDEPENDENT_AMBULATORY_CARE_PROVIDER_SITE_OTHER): Payer: 59 | Admitting: Sports Medicine

## 2013-08-30 VITALS — BP 126/80 | Ht 66.0 in | Wt 156.0 lb

## 2013-08-30 DIAGNOSIS — R252 Cramp and spasm: Secondary | ICD-10-CM

## 2013-08-30 DIAGNOSIS — R269 Unspecified abnormalities of gait and mobility: Secondary | ICD-10-CM

## 2013-08-30 NOTE — Progress Notes (Signed)
Patient ID: [name], female   DOB: 1955-09-27, 58 y.o.   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

## 2013-08-30 NOTE — Assessment & Plan Note (Signed)
We need to give her additional orthotics and try to work on metatarsal support in the orthotic

## 2013-08-30 NOTE — Assessment & Plan Note (Signed)
She probably needs to ingest more salt and I gave suggestions  Standing is also causing foot fatigue with all the breakdown of her arch  She should limit her standing somewhat and I need to work on getting her support in work shoes not just her sport shoes

## 2013-08-30 NOTE — Patient Instructions (Signed)
Get saltly snack 2 times per day or eat 3 dill pickles per day  Schedule an appointment for new orthotic construction, and please bring a few pair of your everyday shoes for padding  Thank you for seeing [place] today!

## 2013-09-01 ENCOUNTER — Encounter (HOSPITAL_COMMUNITY): Payer: Self-pay | Admitting: Pharmacist

## 2013-09-04 ENCOUNTER — Encounter (HOSPITAL_COMMUNITY)
Admission: RE | Admit: 2013-09-04 | Discharge: 2013-09-04 | Disposition: A | Discharge status: Home / Self Care | Payer: 59 | Source: Ambulatory Visit | Attending: Obstetrics & Gynecology | Admitting: Obstetrics & Gynecology

## 2013-09-04 ENCOUNTER — Encounter (HOSPITAL_COMMUNITY): Payer: Self-pay

## 2013-09-04 HISTORY — DX: Other specified postprocedural states: Z98.890

## 2013-09-04 HISTORY — DX: Nausea with vomiting, unspecified: R11.2

## 2013-09-04 HISTORY — DX: Other seasonal allergic rhinitis: J30.2

## 2013-09-04 LAB — CBC
HCT: 37.4 % (ref 36.0–46.0)
Hemoglobin: 12.5 g/dL (ref 12.0–15.0)
MCH: 30.5 pg (ref 26.0–34.0)
MCHC: 33.4 g/dL (ref 30.0–36.0)
MCV: 91.2 fL (ref 78.0–100.0)
Platelets: 304 10*3/uL (ref 150–400)
RBC: 4.1 MIL/uL (ref 3.87–5.11)
RDW: 13 % (ref 11.5–15.5)
WBC: 5.9 10*3/uL (ref 4.0–10.5)

## 2013-09-04 LAB — COMPREHENSIVE METABOLIC PANEL
ALT: 17 U/L (ref 0–35)
AST: 17 U/L (ref 0–37)
Albumin: 3.9 g/dL (ref 3.5–5.2)
Alkaline Phosphatase: 52 U/L (ref 39–117)
BUN: 16 mg/dL (ref 6–23)
CO2: 30 mEq/L (ref 19–32)
Calcium: 9.4 mg/dL (ref 8.4–10.5)
Chloride: 101 mEq/L (ref 96–112)
Creatinine, Ser: 1.01 mg/dL (ref 0.50–1.10)
GFR calc Af Amer: 71 mL/min — ABNORMAL LOW (ref >90)
GFR calc non Af Amer: 61 mL/min — ABNORMAL LOW (ref >90)
Glucose, Bld: 138 mg/dL — ABNORMAL HIGH (ref 70–99)
Potassium: 3.9 mEq/L (ref 3.7–5.3)
Sodium: 140 mEq/L (ref 137–147)
Total Bilirubin: <0.2 mg/dL — ABNORMAL LOW (ref 0.3–1.2)
Total Protein: 7.1 g/dL (ref 6.0–8.3)

## 2013-09-04 NOTE — Patient Instructions (Addendum)
   Your procedure is scheduled on:  Thursday, Feb 5  Enter through the [employer] [facility] at: [place] up the phone at the desk and dial [phone] and inform us of your arrival.  Please call this number if you have any problems the morning of surgery: [phone]  Remember: Do not eat food after midnight: Wednesday Do not drink clear liquids after: 3 AM Thursday Take these medicines the morning of surgery with a SIP OF WATER:  Allegra-D, flonase nasal spray  Do not wear jewelry, make-up, or FINGER nail polish No metal in your hair or on your body. Do not wear lotions, powders, perfumes.  You may wear deodorant.  Do not bring valuables to the hospital. Contacts, dentures or bridgework may not be worn into surgery.  Leave suitcase in the car. After Surgery it may be brought to your room. For patients being admitted to the hospital, checkout time is 11:00am the day of discharge.  Home with husband Bud cell [phone].

## 2013-09-06 NOTE — H&P (Addendum)
[name] is an 58 y.o. female, menopausal, with abnormal uterine bleeding. Endometrial biopsy negative for hyperplasia and cancer. Patient has long standing fibroids history and had H/scopy and D&C in past for abn bleeding. She is using HRT for menopausal symptoms and started to have abnormal post-menopausal bleeding when HRT dose was reduced (reduced Prometrium). She had endometrial biopsy, pelvic sonogram, sonohysterogram to assess for fibroid/polyp in the cavity. She has multiple fibroids and had not changed in size overall, now at 215 cc. Her Paps are normal.  She has some stress urinary incontinence, mainly with full bladder and we are deferring intervention unless problem persists after hysterectomy.  She prefers vaginal hysterectomy but due to wide nature of uterus and desire to remove ovaries, we will proceed with laparoscopic hysterectomy and remove tubes and ovaries.    No LMP recorded. Patient is postmenopausal.    Past Medical History  Diagnosis Date  . PONV (postoperative nausea and vomiting)   . SVD (spontaneous vaginal delivery)     x 1  . Seasonal allergies   . Anemia     Past Surgical History  Procedure Laterality Date  . Colonoscopy    . Dilation and curettage of uterus      x 3  . Wisdom tooth extraction    . Eye surgery      lasik bilateral    No family history on file.  Social History:  reports that she quit smoking about 16 years ago. Her smoking use included Cigarettes. She has a 10 pack-year smoking history. She has never used smokeless tobacco. She reports that she drinks alcohol. She reports that she does not use illicit drugs.  Allergies: No Known Allergies  No prescriptions prior to admission    Review of Systems  Constitutional: Negative for fever.  Respiratory: Negative for cough.   Cardiovascular: Negative for chest pain.  Gastrointestinal: Negative for heartburn.  Genitourinary: Negative for dysuria.  Musculoskeletal: Negative for  myalgias.  Skin: Negative for rash.  Neurological: Negative for headaches.    There were no vitals taken for this visit. Physical Exam A&O x 3, no acute distress. Pleasant HEENT neg, no thyromegaly Lungs CTA bilat CV RRR, S1S2 normal Abdo soft, non tender, non acute Extr no edema/ tenderness Pelvic Uterus 14 wks size, wide, mobile.   No results found for this or any previous visit (from the past 24 hour(s)).  No results found.  Assessment/Plan: 58 yo female with multiple fibroids and ongoing postmenopausal bleeding, not resolved with changes in HRT dose. Patients wants to stop this abnormal bleeding, not a candidate for endometrial ablation or Mirena. Plan Total Laparoscopic hysterectomy, bilateral salpingoophorectomy per patient's choice, especially since she is menopausal on HRT.  Risks/complications of surgery reviewed incl infection, bleeding, damage to internal organs including bladder, bowels, ureters, blood vessels, other risks from anesthesia, VTE and delayed complications of any surgery, complications in future surgery reviewed.   Rodriques Badie R 09/06/2013, 12:28 PM  09/07/2013 12.50 pm - Addendum:  Pt seen and assessed, no interval change. H&P reviewed. Proceed with above surgery.  V.Gaston Dase, MD

## 2013-09-07 ENCOUNTER — Encounter (HOSPITAL_COMMUNITY)
Admission: RE | Disposition: A | Discharge status: Home / Self Care | Payer: Self-pay | Source: Ambulatory Visit | Attending: Obstetrics & Gynecology

## 2013-09-07 ENCOUNTER — Encounter (HOSPITAL_COMMUNITY): Payer: 59 | Admitting: Anesthesiology

## 2013-09-07 ENCOUNTER — Encounter (HOSPITAL_COMMUNITY): Payer: Self-pay | Admitting: *Deleted

## 2013-09-07 ENCOUNTER — Observation Stay (HOSPITAL_COMMUNITY)
Admission: RE | Admit: 2013-09-07 | Discharge: 2013-09-08 | Disposition: A | Discharge status: Home / Self Care | Payer: 59 | Source: Ambulatory Visit | Attending: Obstetrics & Gynecology | Admitting: Obstetrics & Gynecology

## 2013-09-07 ENCOUNTER — Ambulatory Visit (HOSPITAL_COMMUNITY): Payer: 59 | Admitting: Anesthesiology

## 2013-09-07 DIAGNOSIS — Z90721 Acquired absence of ovaries, unilateral: Secondary | ICD-10-CM

## 2013-09-07 DIAGNOSIS — N393 Stress incontinence (female) (male): Secondary | ICD-10-CM | POA: Insufficient documentation

## 2013-09-07 DIAGNOSIS — N950 Postmenopausal bleeding: Principal | ICD-10-CM | POA: Insufficient documentation

## 2013-09-07 DIAGNOSIS — R252 Cramp and spasm: Secondary | ICD-10-CM

## 2013-09-07 DIAGNOSIS — R269 Unspecified abnormalities of gait and mobility: Secondary | ICD-10-CM

## 2013-09-07 DIAGNOSIS — N9489 Other specified conditions associated with female genital organs and menstrual cycle: Secondary | ICD-10-CM | POA: Insufficient documentation

## 2013-09-07 DIAGNOSIS — N72 Inflammatory disease of cervix uteri: Secondary | ICD-10-CM | POA: Insufficient documentation

## 2013-09-07 DIAGNOSIS — D219 Benign neoplasm of connective and other soft tissue, unspecified: Secondary | ICD-10-CM

## 2013-09-07 DIAGNOSIS — Z87891 Personal history of nicotine dependence: Secondary | ICD-10-CM | POA: Insufficient documentation

## 2013-09-07 DIAGNOSIS — Z90710 Acquired absence of both cervix and uterus: Secondary | ICD-10-CM

## 2013-09-07 DIAGNOSIS — D649 Anemia, unspecified: Secondary | ICD-10-CM | POA: Insufficient documentation

## 2013-09-07 DIAGNOSIS — D252 Subserosal leiomyoma of uterus: Secondary | ICD-10-CM | POA: Insufficient documentation

## 2013-09-07 DIAGNOSIS — D251 Intramural leiomyoma of uterus: Secondary | ICD-10-CM | POA: Insufficient documentation

## 2013-09-07 DIAGNOSIS — M79609 Pain in unspecified limb: Secondary | ICD-10-CM

## 2013-09-07 DIAGNOSIS — N840 Polyp of corpus uteri: Secondary | ICD-10-CM | POA: Insufficient documentation

## 2013-09-07 DIAGNOSIS — M2020 Hallux rigidus, unspecified foot: Secondary | ICD-10-CM

## 2013-09-07 DIAGNOSIS — M7550 Bursitis of unspecified shoulder: Secondary | ICD-10-CM

## 2013-09-07 HISTORY — PX: ROBOTIC ASSISTED TOTAL HYSTERECTOMY WITH BILATERAL SALPINGO OOPHERECTOMY: SHX6086

## 2013-09-07 HISTORY — DX: Benign neoplasm of connective and other soft tissue, unspecified: D21.9

## 2013-09-07 HISTORY — DX: Acquired absence of both cervix and uterus: Z90.721

## 2013-09-07 HISTORY — DX: Acquired absence of both cervix and uterus: Z90.710

## 2013-09-07 SURGERY — ROBOTIC ASSISTED TOTAL HYSTERECTOMY WITH BILATERAL SALPINGO OOPHORECTOMY
Anesthesia: General | Site: Abdomen | Laterality: Bilateral

## 2013-09-07 MED ORDER — SODIUM CHLORIDE 0.9 % IJ SOLN
INTRAMUSCULAR | Status: AC
  Filled 2013-09-07: qty 50

## 2013-09-07 MED ORDER — GLYCOPYRROLATE 0.2 MG/ML IJ SOLN
INTRAMUSCULAR | Status: AC
  Filled 2013-09-07: qty 2

## 2013-09-07 MED ORDER — LACTATED RINGERS IR SOLN
Status: DC | PRN
  Administered 2013-09-07: 3000 mL

## 2013-09-07 MED ORDER — NEOSTIGMINE METHYLSULFATE 1 MG/ML IJ SOLN
INTRAMUSCULAR | Status: AC
  Filled 2013-09-07: qty 1

## 2013-09-07 MED ORDER — NEOSTIGMINE METHYLSULFATE 1 MG/ML IJ SOLN
INTRAMUSCULAR | Status: DC | PRN
  Administered 2013-09-07: 3 mg via INTRAVENOUS

## 2013-09-07 MED ORDER — FENTANYL CITRATE 0.05 MG/ML IJ SOLN
INTRAMUSCULAR | Status: DC | PRN
  Administered 2013-09-07: 100 ug via INTRAVENOUS
  Administered 2013-09-07 (×3): 50 ug via INTRAVENOUS

## 2013-09-07 MED ORDER — FENTANYL CITRATE 0.05 MG/ML IJ SOLN: 25.0000 ug | INTRAMUSCULAR | Status: DC | PRN

## 2013-09-07 MED ORDER — LIDOCAINE HCL (CARDIAC) 20 MG/ML IV SOLN
INTRAVENOUS | Status: AC
  Filled 2013-09-07: qty 5

## 2013-09-07 MED ORDER — DEXAMETHASONE SODIUM PHOSPHATE 10 MG/ML IJ SOLN
INTRAMUSCULAR | Status: AC
  Filled 2013-09-07: qty 1

## 2013-09-07 MED ORDER — EPHEDRINE SULFATE 50 MG/ML IJ SOLN
INTRAMUSCULAR | Status: DC | PRN
  Administered 2013-09-07: 5 mg via INTRAVENOUS
  Administered 2013-09-07: 10 mg via INTRAVENOUS
  Administered 2013-09-07 (×2): 5 mg via INTRAVENOUS
  Administered 2013-09-07: 10 mg via INTRAVENOUS

## 2013-09-07 MED ORDER — KETOROLAC TROMETHAMINE 30 MG/ML IJ SOLN: 15.0000 mg | Freq: Once | INTRAMUSCULAR | Status: DC | PRN

## 2013-09-07 MED ORDER — ONDANSETRON HCL 4 MG/2ML IJ SOLN
INTRAMUSCULAR | Status: DC | PRN
  Administered 2013-09-07: 4 mg via INTRAVENOUS

## 2013-09-07 MED ORDER — GLYCOPYRROLATE 0.2 MG/ML IJ SOLN
INTRAMUSCULAR | Status: AC
  Filled 2013-09-07: qty 1

## 2013-09-07 MED ORDER — KETOROLAC TROMETHAMINE 30 MG/ML IJ SOLN: 30.0000 mg | Freq: Once | INTRAMUSCULAR | Status: DC

## 2013-09-07 MED ORDER — METOCLOPRAMIDE HCL 5 MG/ML IJ SOLN: 10.0000 mg | Freq: Once | INTRAMUSCULAR | Status: DC | PRN

## 2013-09-07 MED ORDER — ONDANSETRON HCL 4 MG/2ML IJ SOLN: 4.0000 mg | Freq: Four times a day (QID) | INTRAMUSCULAR | Status: DC | PRN

## 2013-09-07 MED ORDER — MONTELUKAST SODIUM 10 MG PO TABS
10.0000 mg | ORAL_TABLET | Freq: Every day | ORAL | Status: DC
  Administered 2013-09-07: 10 mg via ORAL
  Filled 2013-09-07: qty 1

## 2013-09-07 MED ORDER — HYDROMORPHONE HCL PF 1 MG/ML IJ SOLN
INTRAMUSCULAR | Status: AC
Start: 2013-09-07 — End: 2013-09-07
  Filled 2013-09-07: qty 1

## 2013-09-07 MED ORDER — KETOROLAC TROMETHAMINE 30 MG/ML IJ SOLN
INTRAMUSCULAR | Status: DC | PRN
  Administered 2013-09-07: 30 mg via INTRAVENOUS

## 2013-09-07 MED ORDER — PROPOFOL 10 MG/ML IV BOLUS
INTRAVENOUS | Status: DC | PRN
  Administered 2013-09-07: 200 mg via INTRAVENOUS

## 2013-09-07 MED ORDER — ONDANSETRON HCL 4 MG PO TABS: 4.0000 mg | ORAL_TABLET | Freq: Four times a day (QID) | ORAL | Status: DC | PRN

## 2013-09-07 MED ORDER — ROCURONIUM BROMIDE 100 MG/10ML IV SOLN
INTRAVENOUS | Status: AC
  Filled 2013-09-07: qty 1

## 2013-09-07 MED ORDER — ACETAMINOPHEN 10 MG/ML IV SOLN
1000.0000 mg | Freq: Once | INTRAVENOUS | Status: AC
  Administered 2013-09-07: 1000 mg via INTRAVENOUS
  Filled 2013-09-07: qty 100

## 2013-09-07 MED ORDER — MEPERIDINE HCL 25 MG/ML IJ SOLN: 6.2500 mg | INTRAMUSCULAR | Status: DC | PRN

## 2013-09-07 MED ORDER — PROPOFOL 10 MG/ML IV EMUL
INTRAVENOUS | Status: AC
  Filled 2013-09-07: qty 20

## 2013-09-07 MED ORDER — ATROPINE SULFATE 0.4 MG/ML IJ SOLN
INTRAMUSCULAR | Status: AC
  Filled 2013-09-07: qty 1

## 2013-09-07 MED ORDER — CEFAZOLIN SODIUM-DEXTROSE 2-3 GM-% IV SOLR
2.0000 g | INTRAVENOUS | Status: AC
  Administered 2013-09-07: 2 g via INTRAVENOUS

## 2013-09-07 MED ORDER — ROPIVACAINE HCL 5 MG/ML IJ SOLN
INTRAMUSCULAR | Status: DC | PRN
  Administered 2013-09-07: 90 mL

## 2013-09-07 MED ORDER — KETOROLAC TROMETHAMINE 30 MG/ML IJ SOLN
INTRAMUSCULAR | Status: AC
  Filled 2013-09-07: qty 1

## 2013-09-07 MED ORDER — FLUTICASONE PROPIONATE 50 MCG/ACT NA SUSP
2.0000 | Freq: Every day | NASAL | Status: DC
  Filled 2013-09-07: qty 16

## 2013-09-07 MED ORDER — GLYCOPYRROLATE 0.2 MG/ML IJ SOLN
INTRAMUSCULAR | Status: DC | PRN
  Administered 2013-09-07 (×2): 0.2 mg via INTRAVENOUS
  Administered 2013-09-07: 0.6 mg via INTRAVENOUS

## 2013-09-07 MED ORDER — HYDROMORPHONE HCL PF 1 MG/ML IJ SOLN
INTRAMUSCULAR | Status: DC | PRN
  Administered 2013-09-07: 1 mg via INTRAVENOUS

## 2013-09-07 MED ORDER — SCOPOLAMINE 1 MG/3DAYS TD PT72
MEDICATED_PATCH | TRANSDERMAL | Status: AC
  Administered 2013-09-07: 1.5 mg via TRANSDERMAL
  Filled 2013-09-07: qty 1

## 2013-09-07 MED ORDER — LACTATED RINGERS IV SOLN
INTRAVENOUS | Status: DC
  Administered 2013-09-07 (×4): via INTRAVENOUS

## 2013-09-07 MED ORDER — SODIUM CHLORIDE 0.9 % IJ SOLN
INTRAMUSCULAR | Status: AC
  Filled 2013-09-07: qty 10

## 2013-09-07 MED ORDER — ONDANSETRON HCL 4 MG/2ML IJ SOLN
INTRAMUSCULAR | Status: AC
  Filled 2013-09-07: qty 2

## 2013-09-07 MED ORDER — DEXAMETHASONE SODIUM PHOSPHATE 10 MG/ML IJ SOLN
INTRAMUSCULAR | Status: DC | PRN
  Administered 2013-09-07: 10 mg via INTRAVENOUS

## 2013-09-07 MED ORDER — MIDAZOLAM HCL 2 MG/2ML IJ SOLN
INTRAMUSCULAR | Status: DC | PRN
  Administered 2013-09-07: 2 mg via INTRAVENOUS

## 2013-09-07 MED ORDER — ROPIVACAINE HCL 5 MG/ML IJ SOLN
INTRAMUSCULAR | Status: AC
  Filled 2013-09-07: qty 60

## 2013-09-07 MED ORDER — LIDOCAINE HCL (CARDIAC) 20 MG/ML IV SOLN
INTRAVENOUS | Status: DC | PRN
  Administered 2013-09-07: 80 mg via INTRAVENOUS

## 2013-09-07 MED ORDER — MIDAZOLAM HCL 2 MG/2ML IJ SOLN
INTRAMUSCULAR | Status: AC
  Filled 2013-09-07: qty 2

## 2013-09-07 MED ORDER — GLYCOPYRROLATE 0.2 MG/ML IJ SOLN
INTRAMUSCULAR | Status: AC
  Filled 2013-09-07: qty 3

## 2013-09-07 MED ORDER — SCOPOLAMINE 1 MG/3DAYS TD PT72
1.0000 | MEDICATED_PATCH | TRANSDERMAL | Status: DC
  Administered 2013-09-07: 1.5 mg via TRANSDERMAL

## 2013-09-07 MED ORDER — FENTANYL CITRATE 0.05 MG/ML IJ SOLN
INTRAMUSCULAR | Status: AC
  Filled 2013-09-07: qty 5

## 2013-09-07 MED ORDER — MENTHOL 3 MG MT LOZG
1.0000 | LOZENGE | OROMUCOSAL | Status: DC | PRN
  Administered 2013-09-08: 3 mg via ORAL
  Filled 2013-09-07: qty 9

## 2013-09-07 MED ORDER — OXYCODONE-ACETAMINOPHEN 5-325 MG PO TABS
1.0000 | ORAL_TABLET | ORAL | Status: DC | PRN
  Administered 2013-09-07 – 2013-09-08 (×3): 2 via ORAL
  Filled 2013-09-07 (×3): qty 2

## 2013-09-07 MED ORDER — ROCURONIUM BROMIDE 100 MG/10ML IV SOLN
INTRAVENOUS | Status: DC | PRN
  Administered 2013-09-07 (×4): 10 mg via INTRAVENOUS
  Administered 2013-09-07: 40 mg via INTRAVENOUS

## 2013-09-07 SURGICAL SUPPLY — 59 items
BAG URINE DRAINAGE (UROLOGICAL SUPPLIES) ×2 IMPLANT
BARRIER ADHS 3X4 INTERCEED (GAUZE/BANDAGES/DRESSINGS) IMPLANT
CATH FOLEY 3WAY  5CC 16FR (CATHETERS) ×1
CATH FOLEY 3WAY 5CC 16FR (CATHETERS) ×1 IMPLANT
CHLORAPREP W/TINT 26ML (MISCELLANEOUS) ×2 IMPLANT
CLOTH BEACON ORANGE TIMEOUT ST (SAFETY) ×2 IMPLANT
CONT PATH 16OZ SNAP LID 3702 (MISCELLANEOUS) ×2 IMPLANT
COVER MAYO STAND STRL (DRAPES) ×2 IMPLANT
COVER TABLE BACK 60X90 (DRAPES) ×4 IMPLANT
COVER TIP SHEARS 8 DVNC (MISCELLANEOUS) ×1 IMPLANT
COVER TIP SHEARS 8MM DA VINCI (MISCELLANEOUS) ×1
DECANTER SPIKE VIAL GLASS SM (MISCELLANEOUS) ×6 IMPLANT
DERMABOND ADVANCED (GAUZE/BANDAGES/DRESSINGS) ×1
DERMABOND ADVANCED .7 DNX12 (GAUZE/BANDAGES/DRESSINGS) ×1 IMPLANT
DRAPE HUG U DISPOSABLE (DRAPE) ×2 IMPLANT
DRAPE LG THREE QUARTER DISP (DRAPES) ×4 IMPLANT
DRAPE WARM FLUID 44X44 (DRAPE) ×2 IMPLANT
ELECT REM PT RETURN 9FT ADLT (ELECTROSURGICAL) ×2
ELECTRODE REM PT RTRN 9FT ADLT (ELECTROSURGICAL) ×1 IMPLANT
EVACUATOR SMOKE 8.L (FILTER) ×2 IMPLANT
GAUZE VASELINE 3X9 (GAUZE/BANDAGES/DRESSINGS) IMPLANT
GLOVE BIO SURGEON STRL SZ7 (GLOVE) ×4 IMPLANT
GLOVE BIOGEL PI IND STRL 7.0 (GLOVE) ×2 IMPLANT
GLOVE BIOGEL PI INDICATOR 7.0 (GLOVE) ×2
GLOVE ECLIPSE 6.5 STRL STRAW (GLOVE) ×6 IMPLANT
GOWN STRL REUS W/TWL LRG LVL3 (GOWN DISPOSABLE) ×12 IMPLANT
KIT ACCESSORY DA VINCI DISP (KITS) ×1
KIT ACCESSORY DVNC DISP (KITS) ×1 IMPLANT
LEGGING LITHOTOMY PAIR STRL (DRAPES) ×2 IMPLANT
MANIPULATOR UTERINE 4.5 ZUMI (MISCELLANEOUS) IMPLANT
OCCLUDER COLPOPNEUMO (BALLOONS) ×2 IMPLANT
PACK LAVH (CUSTOM PROCEDURE TRAY) ×2 IMPLANT
PAD PREP 24X48 CUFFED NSTRL (MISCELLANEOUS) ×4 IMPLANT
PLUG CATH AND CAP STER (CATHETERS) ×2 IMPLANT
PROTECTOR NERVE ULNAR (MISCELLANEOUS) ×4 IMPLANT
SET CYSTO W/LG BORE CLAMP LF (SET/KITS/TRAYS/PACK) IMPLANT
SET IRRIG TUBING LAPAROSCOPIC (IRRIGATION / IRRIGATOR) ×2 IMPLANT
SOLUTION ELECTROLUBE (MISCELLANEOUS) ×2 IMPLANT
SUT VICRYL 0 27 CT2 27 ABS (SUTURE) ×2 IMPLANT
SUT VICRYL 0 UR6 27IN ABS (SUTURE) ×2 IMPLANT
SUT VICRYL 4-0 PS2 18IN ABS (SUTURE) ×4 IMPLANT
SUT VLOC 180 0 9IN  GS21 (SUTURE) ×1
SUT VLOC 180 0 9IN GS21 (SUTURE) ×1 IMPLANT
SYR 30ML LL (SYRINGE) ×2 IMPLANT
SYR 50ML LL SCALE MARK (SYRINGE) ×2 IMPLANT
SYSTEM CONVERTIBLE TROCAR (TROCAR) ×2 IMPLANT
TIP RUMI ORANGE 6.7MMX12CM (TIP) IMPLANT
TIP UTERINE 5.1X6CM LAV DISP (MISCELLANEOUS) IMPLANT
TIP UTERINE 6.7X10CM GRN DISP (MISCELLANEOUS) IMPLANT
TIP UTERINE 6.7X6CM WHT DISP (MISCELLANEOUS) IMPLANT
TIP UTERINE 6.7X8CM BLUE DISP (MISCELLANEOUS) IMPLANT
TOWEL OR 17X24 6PK STRL BLUE (TOWEL DISPOSABLE) ×4 IMPLANT
TROCAR 12M 150ML BLUNT (TROCAR) ×2 IMPLANT
TROCAR DISP BLADELESS 8 DVNC (TROCAR) ×1 IMPLANT
TROCAR DISP BLADELESS 8MM (TROCAR) ×1
TROCAR XCEL NON-BLD 5MMX100MML (ENDOMECHANICALS) ×2 IMPLANT
TUBING FILTER THERMOFLATOR (ELECTROSURGICAL) ×2 IMPLANT
WARMER LAPAROSCOPE (MISCELLANEOUS) ×2 IMPLANT
WATER STERILE IRR 1000ML POUR (IV SOLUTION) ×6 IMPLANT

## 2013-09-07 NOTE — Op Note (Signed)
Preoperative diagnosis:  Postmenopausal bleeding, fibroids Postop diagnosis: Same Procedure: da Vinci robot assisted total laparoscopic hysterectomy and bilateral salpingoophorectomy Anesthesia Gen. Endotracheal Surgeon: Dr. Azucena Fallen Assistant: Dr Brien Few IV fluids: 2000 cc LR EBL: 50 cc Urine output: 100 cc clear in foley Complications: none Pathology: Uterus with cervix and both ovaries and fallopian tubes Disposition: PACU, stable Findings: Enlarged fibroid uterus, normal ovaries and fallopian tubes. Normal appearing liver surface. Normal gross appearance of bowel and omentum  Procedure:  Indication: -- 58 yo menopausal female on HRT, with large uterine fibroids and irregular heavy bleeding uncontrolled with changes in HRT dose. Endometrial biopsy was negative. After reviewing options, patient chose to proceed with hysterectomy and desired removal of ovaries as well since she has declined ovarian function and is using HRT.  Complications of surgery including infection, bleeding, damage to internal organs and other surgery related problems including pneumonia, VTE reviewed and informed written consent was obtained. Also reviewed risks from surgical menopause.  She understood and gave informed written consent.  Patient was brought to the operating room with IV running. She received 2 gm Ancef.  She underwent general anesthesia without difficulty and was given dorsal lithotomy position, prepped and draped in sterile fashion. Foley catheter was placed. Cervix was exposed with a speculum and anterior lip of the cervix was grasped with tenaculum. Uterus was sounded to 10cm. A  # 8  Rumi tip and a medium Koh ring was assembled on the [employer] and entered in the uterine cavity and balloon was inflated to secure it in place (10 # tip was tried but didn't seemed to fit well). Koh ring was palpated again cervico-vaginal junction. Speculum was removed, tenaculum x2 were left on the  cervix.   Attention was focused on abdomen. Supraumbilical 12 mm vertical incision made with scalpel after injecting Marcaine, fascia dissected, grasped with Kocher's and incised, posterior rectus sheath and peritoneum grasped, incised, intraabdominal entry confirmed. Purse string stay stitch of 0-Vicryl taken on fascia and Hassan cannula introduced and Vicryl sutures secured on the Hassan cannula. Pneumoperitoneum was begun. Laparoscope was introduced and the peritoneal cavity was evaluated. There was no evidence of adhesions. Trendelenburg position given. Enlarged uterus was noted which was wide with multiple fibroids and also had a large subserosal calcified fibroid. Both the ovaries, tubes, ureters, bowel, liver appeared normal. Some colon adhesions noted on the left pelvis.  4 Port sites marked and injected with Marcaine. Two Robotic cannulas inserted on left, one on the right and one assistant 5 mm trocar on the right as well. Robot was docked. PK, Prograsp and Endoscissors introduced through Robotic arms.   Dr. Benjie Karvonen scrubbed out and went for surgical console and Dr Ronita Hipps stayed at patient bedside. Uterus was deviated to the patient's right. The left IP ligament was desiccated and cut, followed by broad ligament and then the left Round ligament which was desiccated and cut. Anterior bladder broad ligament was opened and incised. Posterior broad ligament incised up to the uterosacral ligament and left uterine vessels skeletonized. Uterine vessels were isolated and desiccated. Uterus was deviated to the left and right IP was exposed, desiccated and incised followed by broad ligament and right Round ligament. Anterior broad ligament was incised to create bladder flap, bladder was pushed away by blunt and sharp dissection with excellent hemostasis. Koh ring impression at cervicovaginal junction was seen well anteriorly. Right posterior broad ligament dissected, right Uterine vessels skeletonized. Right  uterine vessels were desiccated and cut. Uterus was deviated  to the right and the left uterine vessels were desiccated and cut. Vaginal occluder was inflated. Colpotomy was begun starting from midline posteriorly above the uterosacral ligaments and coming to the left and right and then circumferentially anteriorly. Uterus was bivalved with scissors and pulled out vaginally with fibroids, cervix, both tubes and ovaries. Vaginal occluder was placed back in the vagina to maintain pneumoperitoneum.  Vaginal cut edges were evaluated for hemostasis which was excellent. Irrigation was performed pedicles appeared dry. Robotic instruments switched for needle driver and long tip tissue forceps and PK in 3rd arm. 0 V-lock was used for suturing vaginal cuff which was dropped in from the camera port. Vaginal cuff was sutured running stitches from left to right securing the angles well. Irrigation was performed, all pedicles appeared to be hemostatic. Robotic instruments were removed. Robot was de-docked. Patient was made supine. Lap'scope was reintroduced, hemostasis was excellent.  Robotic cannulas were removed under vision. Laparoscope and central port removed under vision.  The stay sutures at the fascia tied together with excellent fascial closure. Skin approximated with subcuticular stitches on 4-0 Vicryl. Dermabond was applied. No vaginal bleeding noted on vaginal exam at the end. All instruments/lap/sponges counts were correct x2.  No complications. Patient tolerated procedure well and was reversed from anesthesia and brought to the PACU stable condition. Foley catheter to be removed after few hours when patient ambulating.  Dr Benjie Karvonen was the surgeon for entire case.

## 2013-09-07 NOTE — Anesthesia Postprocedure Evaluation (Signed)
Anesthesia Post Note  Patient: [name]  Procedure(s) Performed: Procedure(s) (LRB): ROBOTIC ASSISTED TOTAL HYSTERECTOMY WITH BILATERAL SALPINGO OOPHORECTOMY (Bilateral)  Anesthesia type: GA  Patient location: PACU  Post pain: Pain level controlled  Post assessment: Post-op Vital signs reviewed  Last Vitals:  Filed Vitals:   09/07/13 1700  BP: 117/59  Pulse: 80  Temp:   Resp: 13    Post vital signs: Reviewed  Level of consciousness: sedated  Complications: No apparent anesthesia complications

## 2013-09-07 NOTE — Anesthesia Preprocedure Evaluation (Addendum)
Anesthesia Evaluation  Patient identified by MRN, date of birth, ID band Patient awake    Reviewed: Allergy & Precautions, H&P , NPO status , Patient's Chart, lab work & pertinent test results, reviewed documented beta blocker date and time   History of Anesthesia Complications (+) PONV  Airway Mallampati: I TM Distance: >3 FB Neck ROM: full    Dental  (+) Teeth Intact   Pulmonary former smoker (quit 26 years ago),  Seasonal allergies breath sounds clear to auscultation  Pulmonary exam normal       Cardiovascular Exercise Tolerance: Good Rhythm:regular Rate:Normal     Neuro/Psych negative neurological ROS  negative psych ROS   GI/Hepatic negative GI ROS, Neg liver ROS,   Endo/Other  negative endocrine ROS  Renal/GU      Musculoskeletal   Abdominal   Peds  Hematology  (+) anemia ,   Anesthesia Other Findings   Reproductive/Obstetrics negative OB ROS                          Anesthesia Physical Anesthesia Plan  ASA: II  Anesthesia Plan: General ETT   Post-op Pain Management:    Induction:   Airway Management Planned:   Additional Equipment:   Intra-op Plan:   Post-operative Plan:   Informed Consent: I have reviewed the patients History and Physical, chart, labs and discussed the procedure including the risks, benefits and alternatives for the proposed anesthesia with the patient or authorized representative who has indicated his/her understanding and acceptance.   Dental Advisory Given  Plan Discussed with: CRNA and Surgeon  Anesthesia Plan Comments:         Anesthesia Quick Evaluation

## 2013-09-07 NOTE — Transfer of Care (Signed)
Immediate Anesthesia Transfer of Care Note  Patient: [name]  Procedure(s) Performed: Procedure(s): ROBOTIC ASSISTED TOTAL HYSTERECTOMY WITH BILATERAL SALPINGO OOPHORECTOMY (Bilateral)  Patient Location: PACU  Anesthesia Type:General  Level of Consciousness: sedated  Airway & Oxygen Therapy: Patient Spontanous Breathing and Patient connected to nasal cannula oxygen  Post-op Assessment: Report given to PACU RN and Post -op Vital signs reviewed and stable  Post vital signs: stable  Complications: No apparent anesthesia complications

## 2013-09-08 ENCOUNTER — Encounter (HOSPITAL_COMMUNITY): Payer: Self-pay | Admitting: Obstetrics & Gynecology

## 2013-09-08 LAB — CBC
HCT: 31.5 % — ABNORMAL LOW (ref 36.0–46.0)
Hemoglobin: 10.4 g/dL — ABNORMAL LOW (ref 12.0–15.0)
MCH: 30.2 pg (ref 26.0–34.0)
MCHC: 33 g/dL (ref 30.0–36.0)
MCV: 91.6 fL (ref 78.0–100.0)
Platelets: 242 10*3/uL (ref 150–400)
RBC: 3.44 MIL/uL — ABNORMAL LOW (ref 3.87–5.11)
RDW: 12.9 % (ref 11.5–15.5)
WBC: 8.2 10*3/uL (ref 4.0–10.5)

## 2013-09-08 MED ORDER — IBUPROFEN 200 MG PO TABS: 600.0000 mg | ORAL_TABLET | Freq: Four times a day (QID) | ORAL | Status: DC | PRN

## 2013-09-08 MED ORDER — OXYCODONE-ACETAMINOPHEN 5-325 MG PO TABS: 1.0000 | ORAL_TABLET | ORAL | Status: DC | PRN

## 2013-09-08 NOTE — Discharge Summary (Signed)
Physician Discharge Summary  Patient ID: [name] MRN: [id number] DOB/AGE: 57-Jan-1957 57 y.o.  Admit date: 09/07/2013 Discharge date: 09/08/2013  Admission Diagnoses:   Postmenopausal bleeding, Fibroids    Discharge Diagnoses:  Principal Problem:   Postmenopausal bleeding Active Problems:   Fibroids   S/P hysterectomy with oophorectomy  Discharged Condition: Stable, improved  Hospital Course: stable, uncomplicated. CBC stable.   Discharge Exam: Blood pressure 104/52, pulse 61, temperature 97.5 F (36.4 C), temperature source Oral, resp. rate 18, height 5\' 6"  (1.676 m), weight 166 lb (75.297 kg), SpO2 98.00%.  Disposition: Home  Discharge Orders   Future Appointments Provider Department Dept Phone   10/24/2013 2:15 PM Stefanie Libel, MD [facility] [phone]   Future Orders Complete By Expires   Call MD for:  difficulty breathing, headache or visual disturbances  As directed    Call MD for:  hives  As directed    Call MD for:  persistant dizziness or light-headedness  As directed    Call MD for:  persistant nausea and vomiting  As directed    Call MD for:  redness, tenderness, or signs of infection (pain, swelling, redness, odor or green/yellow discharge around incision site)  As directed    Call MD for:  severe uncontrolled pain  As directed    Call MD for:  temperature >100.4  As directed    Call MD for:  As directed    Comments:     Vaginal bleeding (spotting ok)   Diet - low sodium heart healthy  As directed    Driving Restrictions  As directed    Comments:     1-2 wks as needed   Increase activity slowly  As directed    Lifting restrictions  As directed    Comments:     Lift only upto 15 lbs for 6 wks   Sexual Activity Restrictions  As directed    Comments:     6 wks       Medication List    STOP taking these medications       progesterone 100 MG capsule  Commonly known as:  PROMETRIUM      TAKE these medications       ascorbic acid 1000 MG tablet  Commonly known as:  VITAMIN C  Take 1,000 mg by mouth daily.     b complex vitamins tablet  Take 1 tablet by mouth daily.     CALCIUM 600 + D PO  Take 1 tablet by mouth daily.     DRAMAMINE II 25 MG tablet  Generic drug:  meclizine  Take 25 mg by mouth at bedtime.     estradiol 0.05 MG/24HR patch  Commonly known as:  VIVELLE-DOT  Place 1.5 patches onto the skin 2 (two) times a week. Wed & Sat     ferrous sulfate 325 (65 FE) MG tablet  Take 325 mg by mouth daily with breakfast.     fexofenadine-pseudoephedrine 60-120 MG per tablet  Commonly known as:  ALLEGRA-D  Take 1 tablet by mouth daily.     Fish Oil 1000 MG Caps  Take 1 capsule by mouth daily.     fluticasone 50 MCG/ACT nasal spray  Commonly known as:  FLONASE  Place 2 sprays into both nostrils daily.     ibuprofen 200 MG tablet  Commonly known as:  ADVIL  Take 3 tablets (600 mg total) by mouth every 6 (six) hours as needed.     meloxicam  15 MG tablet  Commonly known as:  MOBIC  Take 15 mg by mouth as needed for pain (as needed for foot or back pain).     montelukast 10 MG tablet  Commonly known as:  SINGULAIR  Take 10 mg by mouth at bedtime.     multivitamin tablet  Take 1 tablet by mouth daily.     naproxen 250 MG tablet  Commonly known as:  NAPROSYN  Take 500 mg by mouth 2 (two) times daily as needed for headache.     oxyCODONE-acetaminophen 5-325 MG per tablet  Commonly known as:  PERCOCET/ROXICET  Take 1-2 tablets by mouth every 4 (four) hours as needed for severe pain (moderate to severe pain (when tolerating fluids)).           Follow-up Information   Follow up with Analucia Hush R, MD. Schedule an appointment as soon as possible for a visit in 2 weeks.   Specialty:  Obstetrics and Gynecology   Contact information:   [address] [place] [address] [phone]       Signed: Elveria Royals 09/08/2013, 7:56 AM

## 2013-09-08 NOTE — Progress Notes (Signed)
Pt  Out in wheel chair  teaching complete

## 2013-09-08 NOTE — Anesthesia Postprocedure Evaluation (Signed)
  Anesthesia Post-op Note  Patient: [name]  Procedure(s) Performed: Procedure(s): ROBOTIC ASSISTED TOTAL HYSTERECTOMY WITH BILATERAL SALPINGO OOPHORECTOMY (Bilateral)  Patient Location: [facility]  Anesthesia Type:General  Level of Consciousness: awake, alert  and oriented  Airway and Oxygen Therapy: Patient Spontanous Breathing  Post-op Pain: mild  Post-op Assessment: Patient's Cardiovascular Status Stable, Respiratory Function Stable, Patent Airway, No signs of Nausea or vomiting, Adequate PO intake and Pain level controlled  Post-op Vital Signs: stable  Complications: No apparent anesthesia complications

## 2013-09-08 NOTE — Discharge Instructions (Signed)
Total Laparoscopic Hysterectomy, Care After  Refer to this sheet in the next few weeks. These instructions provide you with information on caring for yourself after your procedure. Your health care provider may also give you more specific instructions. Your treatment has been planned according to current medical practices, but problems sometimes occur. Call your health care provider if you have any problems or questions after your procedure.  WHAT TO EXPECT AFTER THE PROCEDURE  · Pain and bruising at the incision sites. You will be given pain medicine to control it.  · Menopausal symptoms such as hot flashes, night sweats, and insomnia if your ovaries were removed.  · Sore throat from the breathing tube that was inserted during surgery.  HOME CARE INSTRUCTIONS  · Only take over-the-counter or prescription medicines for pain, discomfort, or fever as directed by your health care provider.    · Do not take aspirin. It can cause bleeding.    · Do not drive when taking pain medicine.    · Follow your health care provider's advice regarding diet, exercise, lifting, driving, and general activities.    · Resume your usual diet as directed and allowed.    · Get plenty of rest and sleep.    · Do not douche, use tampons, or have sexual intercourse for at least 6 weeks, or until your health care provider gives you permission.    · Change your bandages (dressings) as directed by your health care provider.    · Monitor your temperature and notify your health care provider of a fever.    · Take showers instead of baths for 2 3 weeks.    · Do not drink alcohol until your health care provider gives you permission.    · If you develop constipation, you may take a mild laxative with your health care provider's permission. Bran foods may help with constipation problems. Drinking enough fluids to keep your urine clear or pale yellow may help as well.    · Try to have someone home with you for 1 2 weeks to help around the house.     · Keep all of your follow-up appointments as directed by your health care provider.    SEEK MEDICAL CARE IF:  · You have swelling, redness, or increasing pain around your incision sites.    · You have pus coming from your incision.    · You notice a bad smell coming from your incision.    · Your incision breaks open.    · You feel dizzy or lightheaded.    · You have pain or bleeding when you urinate.    · You have persistent diarrhea.    · You have persistent nausea and vomiting.    · You have abnormal vaginal discharge.    · You have a rash.    · You have any type of abnormal reaction or develop an allergy to your medicine.    · You have poor pain control with your prescribed medicine.    SEEK IMMEDIATE MEDICAL CARE IF:  · You have chest pain or shortness of breath.  · You have severe abdominal pain that is not relieved with pain medicine.  · You have pain or swelling in your legs.  Document Released: 05/10/2013 Document Reviewed: 02/07/2013  [employer]® Patient Information ©2014 [employer].

## 2013-09-08 NOTE — Progress Notes (Signed)
1 Day Post-Op Procedure(s) (LRB): ROBOTIC ASSISTED TOTAL HYSTERECTOMY WITH BILATERAL SALPINGO OOPHORECTOMY (Bilateral)  Subjective: Patient reports incisional pain, tolerating PO and + flatus.  Has voiding difficulty but voided 400 cc after retained PVR 200 cc from prior void. Continue to watch and check one more time.   Objective: I have reviewed patient's vital signs, intake and output, medications and labs. Surgical findings reviewed.  General: alert and cooperative Resp: clear to auscultation bilaterally Cardio: regular rate and rhythm, S1, S2 normal, no murmur, click, rub or gallop GI: soft, non-tender; bowel sounds normal; no masses,  no organomegaly Extremities: extremities normal, atraumatic, no cyanosis or edema Vaginal Bleeding: none  Assessment: s/p Procedure(s): ROBOTIC ASSISTED TOTAL HYSTERECTOMY WITH BILATERAL SALPINGO OOPHORECTOMY (Bilateral): stable, progressing well and tolerating diet  Plan: Advance diet Encourage ambulation Advance to PO medication Discontinue IV fluids Discharge home  LOS: 1 day    [name] R 09/08/2013, 7:51 AM

## 2013-09-11 NOTE — Addendum Note (Signed)
Addendum created 09/11/13 1822 by Rudean Curt, MD   Modules edited: Anesthesia Attestations

## 2013-09-18 ENCOUNTER — Inpatient Hospital Stay (HOSPITAL_COMMUNITY)
Admission: AD | Admit: 2013-09-18 | Discharge: 2013-09-20 | DRG: 759 | Disposition: A | Discharge status: Home / Self Care | Payer: 59 | Source: Ambulatory Visit | Attending: Obstetrics & Gynecology | Admitting: Obstetrics & Gynecology

## 2013-09-18 ENCOUNTER — Inpatient Hospital Stay (HOSPITAL_COMMUNITY): Payer: 59

## 2013-09-18 ENCOUNTER — Encounter (HOSPITAL_COMMUNITY): Payer: Self-pay

## 2013-09-18 DIAGNOSIS — R252 Cramp and spasm: Secondary | ICD-10-CM

## 2013-09-18 DIAGNOSIS — N898 Other specified noninflammatory disorders of vagina: Secondary | ICD-10-CM | POA: Diagnosis present

## 2013-09-18 DIAGNOSIS — R61 Generalized hyperhidrosis: Secondary | ICD-10-CM | POA: Diagnosis present

## 2013-09-18 DIAGNOSIS — K5900 Constipation, unspecified: Secondary | ICD-10-CM | POA: Diagnosis present

## 2013-09-18 DIAGNOSIS — Z90710 Acquired absence of both cervix and uterus: Secondary | ICD-10-CM

## 2013-09-18 DIAGNOSIS — D219 Benign neoplasm of connective and other soft tissue, unspecified: Secondary | ICD-10-CM

## 2013-09-18 DIAGNOSIS — Z87891 Personal history of nicotine dependence: Secondary | ICD-10-CM

## 2013-09-18 DIAGNOSIS — N731 Chronic parametritis and pelvic cellulitis: Principal | ICD-10-CM | POA: Diagnosis present

## 2013-09-18 DIAGNOSIS — R109 Unspecified abdominal pain: Secondary | ICD-10-CM | POA: Diagnosis present

## 2013-09-18 DIAGNOSIS — Z90721 Acquired absence of ovaries, unilateral: Secondary | ICD-10-CM

## 2013-09-18 DIAGNOSIS — R1010 Upper abdominal pain, unspecified: Secondary | ICD-10-CM | POA: Diagnosis present

## 2013-09-18 DIAGNOSIS — N950 Postmenopausal bleeding: Secondary | ICD-10-CM

## 2013-09-18 DIAGNOSIS — M7550 Bursitis of unspecified shoulder: Secondary | ICD-10-CM

## 2013-09-18 DIAGNOSIS — Z780 Asymptomatic menopausal state: Secondary | ICD-10-CM

## 2013-09-18 DIAGNOSIS — R269 Unspecified abnormalities of gait and mobility: Secondary | ICD-10-CM

## 2013-09-18 DIAGNOSIS — M79609 Pain in unspecified limb: Secondary | ICD-10-CM

## 2013-09-18 DIAGNOSIS — M2020 Hallux rigidus, unspecified foot: Secondary | ICD-10-CM

## 2013-09-18 LAB — CBC WITH DIFFERENTIAL/PLATELET
Basophils Absolute: 0 10*3/uL (ref 0.0–0.1)
Basophils Relative: 0 % (ref 0–1)
Eosinophils Absolute: 0.3 10*3/uL (ref 0.0–0.7)
Eosinophils Relative: 2 % (ref 0–5)
HCT: 35.1 % — ABNORMAL LOW (ref 36.0–46.0)
Hemoglobin: 12 g/dL (ref 12.0–15.0)
Lymphocytes Relative: 10 % — ABNORMAL LOW (ref 12–46)
Lymphs Abs: 1.4 10*3/uL (ref 0.7–4.0)
MCH: 30.6 pg (ref 26.0–34.0)
MCHC: 34.2 g/dL (ref 30.0–36.0)
MCV: 89.5 fL (ref 78.0–100.0)
Monocytes Absolute: 1.3 10*3/uL — ABNORMAL HIGH (ref 0.1–1.0)
Monocytes Relative: 10 % (ref 3–12)
Neutro Abs: 10.6 10*3/uL — ABNORMAL HIGH (ref 1.7–7.7)
Neutrophils Relative %: 78 % — ABNORMAL HIGH (ref 43–77)
Platelets: 366 10*3/uL (ref 150–400)
RBC: 3.92 MIL/uL (ref 3.87–5.11)
RDW: 12.5 % (ref 11.5–15.5)
WBC: 13.5 10*3/uL — ABNORMAL HIGH (ref 4.0–10.5)

## 2013-09-18 LAB — COMPREHENSIVE METABOLIC PANEL
ALT: 45 U/L — ABNORMAL HIGH (ref 0–35)
AST: 27 U/L (ref 0–37)
Albumin: 3.5 g/dL (ref 3.5–5.2)
Alkaline Phosphatase: 64 U/L (ref 39–117)
BUN: 12 mg/dL (ref 6–23)
CO2: 24 mEq/L (ref 19–32)
Calcium: 9.4 mg/dL (ref 8.4–10.5)
Chloride: 101 mEq/L (ref 96–112)
Creatinine, Ser: 0.71 mg/dL (ref 0.50–1.10)
GFR calc Af Amer: >90 mL/min (ref >90)
GFR calc non Af Amer: >90 mL/min (ref >90)
Glucose, Bld: 123 mg/dL — ABNORMAL HIGH (ref 70–99)
Potassium: 4.5 mEq/L (ref 3.7–5.3)
Sodium: 139 mEq/L (ref 137–147)
Total Bilirubin: 0.4 mg/dL (ref 0.3–1.2)
Total Protein: 7.4 g/dL (ref 6.0–8.3)

## 2013-09-18 MED ORDER — FAMOTIDINE 20 MG PO TABS
20.0000 mg | ORAL_TABLET | Freq: Two times a day (BID) | ORAL | Status: DC
  Administered 2013-09-18 – 2013-09-20 (×3): 20 mg via ORAL
  Filled 2013-09-18 (×3): qty 1

## 2013-09-18 MED ORDER — IOHEXOL 300 MG/ML  SOLN
50.0000 mL | INTRAMUSCULAR | Status: AC
  Administered 2013-09-18: 50 mL via ORAL

## 2013-09-18 MED ORDER — ZINC OXIDE 20 % EX OINT
TOPICAL_OINTMENT | CUTANEOUS | Status: DC | PRN
  Administered 2013-09-18: 14:00:00 via TOPICAL
  Filled 2013-09-18: qty 28.35

## 2013-09-18 MED ORDER — LACTATED RINGERS IV SOLN
INTRAVENOUS | Status: DC
  Administered 2013-09-18 (×2): via INTRAVENOUS

## 2013-09-18 MED ORDER — DEXTROSE 5 % IV SOLN
1.0000 g | Freq: Four times a day (QID) | INTRAVENOUS | Status: DC
  Administered 2013-09-18 – 2013-09-20 (×8): 1 g via INTRAVENOUS
  Filled 2013-09-18 (×10): qty 1

## 2013-09-18 MED ORDER — BUTORPHANOL TARTRATE 1 MG/ML IJ SOLN
1.0000 mg | Freq: Once | INTRAMUSCULAR | Status: AC
  Administered 2013-09-18: 1 mg via INTRAVENOUS
  Filled 2013-09-18: qty 1

## 2013-09-18 MED ORDER — FLEET ENEMA 7-19 GM/118ML RE ENEM
1.0000 | ENEMA | Freq: Once | RECTAL | Status: AC | PRN
  Administered 2013-09-18: 1 via RECTAL

## 2013-09-18 MED ORDER — ONDANSETRON HCL 4 MG/2ML IJ SOLN
4.0000 mg | Freq: Three times a day (TID) | INTRAMUSCULAR | Status: DC | PRN
  Administered 2013-09-18: 4 mg via INTRAVENOUS
  Filled 2013-09-18: qty 2

## 2013-09-18 MED ORDER — SENNA 8.6 MG PO TABS
1.0000 | ORAL_TABLET | Freq: Every day | ORAL | Status: DC
  Administered 2013-09-19: 8.6 mg via ORAL
  Filled 2013-09-18 (×2): qty 1

## 2013-09-18 MED ORDER — IBUPROFEN 600 MG PO TABS
600.0000 mg | ORAL_TABLET | Freq: Four times a day (QID) | ORAL | Status: DC | PRN
  Administered 2013-09-18 – 2013-09-19 (×4): 600 mg via ORAL
  Filled 2013-09-18 (×4): qty 1

## 2013-09-18 MED ORDER — FENTANYL CITRATE 0.05 MG/ML IJ SOLN
100.0000 ug | Freq: Once | INTRAMUSCULAR | Status: AC
  Administered 2013-09-18: 100 ug via INTRAVENOUS
  Filled 2013-09-18: qty 2

## 2013-09-18 MED ORDER — BUTALBITAL-APAP-CAFFEINE 50-325-40 MG PO TABS
2.0000 | ORAL_TABLET | Freq: Once | ORAL | Status: AC
  Administered 2013-09-18: 2 via ORAL
  Filled 2013-09-18: qty 2

## 2013-09-18 MED ORDER — METRONIDAZOLE IN NACL 5-0.79 MG/ML-% IV SOLN
500.0000 mg | Freq: Three times a day (TID) | INTRAVENOUS | Status: DC
  Administered 2013-09-18 – 2013-09-20 (×6): 500 mg via INTRAVENOUS
  Filled 2013-09-18 (×8): qty 100

## 2013-09-18 MED ORDER — IOHEXOL 300 MG/ML  SOLN
100.0000 mL | Freq: Once | INTRAMUSCULAR | Status: AC | PRN
  Administered 2013-09-18: 100 mL via INTRAVENOUS

## 2013-09-18 MED ORDER — PROMETHAZINE HCL 25 MG/ML IJ SOLN
25.0000 mg | Freq: Three times a day (TID) | INTRAMUSCULAR | Status: DC | PRN
  Administered 2013-09-18: 25 mg via INTRAVENOUS
  Filled 2013-09-18: qty 1

## 2013-09-18 MED ORDER — HYDROMORPHONE HCL PF 1 MG/ML IJ SOLN
1.0000 mg | INTRAMUSCULAR | Status: DC | PRN
  Administered 2013-09-18 (×4): 1 mg via INTRAVENOUS
  Filled 2013-09-18 (×4): qty 1

## 2013-09-18 MED ORDER — DOCUSATE SODIUM 100 MG PO CAPS
100.0000 mg | ORAL_CAPSULE | Freq: Two times a day (BID) | ORAL | Status: DC
  Administered 2013-09-18 – 2013-09-20 (×3): 100 mg via ORAL
  Filled 2013-09-18 (×3): qty 1

## 2013-09-18 NOTE — [facility] Provider Note (Signed)
Medical Screening Exam  S:  Asked by Dr. Benjie Karvonen to see the pt for a medical screening exam.  Pt with severe peristaltic abdominal pain Has been constipated and took significant amount of mag citrate and miralax over the weekend - pain significant starting in the rectum and moving up.  - +nausea, no vomiting.  - no fevers  O:  Filed Vitals:   09/18/13 0413  BP: 130/73  Pulse: 96  Temp: 98 F (36.7 C)  TempSrc: Oral  Resp: 20  SpO2: 100%   Gen: pale, diaphoretic CV: RRR, 2/6 SEM PULM: LCTAB ABD: soft, diffuse tenderness with voluntary guarding, +hyperactive bowel sounds Ext: no edema  A/P - no emergent needs - await CT scan - to be seen by Dr. Benjie Karvonen after

## 2013-09-18 NOTE — H&P (Signed)
[name] is an 58 y.o. female presenting with intermittent colicky abdominal pain since few days but worse this morning. Denies fever, vomiting, vaginal bleeding, CP, SOB.  S/p daVinci robot assisted total L/scopic hysterectomy and BSO on 09/07/13. Did well post-op with good pain relief, tolerating regular diet though eating small volumes and drinking fluids. She is passing flatus but denies normal bowel movements and has passed very small amounts of stool. She went to work on 2/12. She started having colicky abdominal pain, intermittently in waves since 2/12 evening. She was seen in the office on 2/13, had normal vital signs, no fever, abdomen was soft, no guarding and had active bowel sounds. She was advised bowel mngmt with magnesium citrate and fiber. She tried that on 2/14 and felt a bit better but got worse on 2/15 and presented here with pain.   No LMP recorded. Patient is postmenopausal.    Past Medical History  Diagnosis Date  . PONV (postoperative nausea and vomiting)   . SVD (spontaneous vaginal delivery)     x 1  . Seasonal allergies   . Anemia   . Fibroids 09/07/2013   Past Surgical History  Procedure Laterality Date  . Colonoscopy    . Dilation and curettage of uterus      x 3  . Wisdom tooth extraction    . Eye surgery      lasik bilateral  . Robotic assisted total hysterectomy with bilateral salpingo oopherectomy Bilateral 09/07/2013    Procedure: ROBOTIC ASSISTED TOTAL HYSTERECTOMY WITH BILATERAL SALPINGO OOPHORECTOMY;  Surgeon: Elveria Royals, MD;  Location: [facility];  Service: Gynecology;  Laterality: Bilateral;   No family history on file.  Social History:  reports that she quit smoking about 16 years ago. Her smoking use included Cigarettes. She has a 10 pack-year smoking history. She has never used smokeless tobacco. She reports that she drinks alcohol. She reports that she does not use illicit drugs.  Allergies: No Known Allergies  Prescriptions prior to  admission  Medication Sig Dispense Refill  . dimenhyDRINATE (DRAMAMINE) 50 MG tablet Take 50 mg by mouth every 8 (eight) hours as needed.      Marland Kitchen ibuprofen (ADVIL) 200 MG tablet Take 3 tablets (600 mg total) by mouth every 6 (six) hours as needed.  30 tablet  0  . magnesium citrate SOLN Take 1 Bottle by mouth once.      Marland Kitchen oxyCODONE-acetaminophen (PERCOCET/ROXICET) 5-325 MG per tablet Take 1-2 tablets by mouth every 4 (four) hours as needed for severe pain (moderate to severe pain (when tolerating fluids)).  30 tablet  0  . polyethylene glycol (MIRALAX / GLYCOLAX) packet Take 17 g by mouth daily.      Marland Kitchen ascorbic acid (VITAMIN C) 1000 MG tablet Take 1,000 mg by mouth daily.      Marland Kitchen b complex vitamins tablet Take 1 tablet by mouth daily.      . Calcium Carb-Cholecalciferol (CALCIUM 600 + D PO) Take 1 tablet by mouth daily.      Marland Kitchen estradiol (VIVELLE-DOT) 0.05 MG/24HR patch Place 1.5 patches onto the skin 2 (two) times a week. Wed & Sat      . ferrous sulfate 325 (65 FE) MG tablet Take 325 mg by mouth daily with breakfast.      . fexofenadine-pseudoephedrine (ALLEGRA-D) 60-120 MG per tablet Take 1 tablet by mouth daily.      . fluticasone (FLONASE) 50 MCG/ACT nasal spray Place 2 sprays into both nostrils daily.      Marland Kitchen  meclizine (DRAMAMINE II) 25 MG tablet Take 25 mg by mouth at bedtime.      . meloxicam (MOBIC) 15 MG tablet Take 15 mg by mouth as needed for pain (as needed for foot or back pain).      . montelukast (SINGULAIR) 10 MG tablet Take 10 mg by mouth at bedtime.      . Multiple Vitamin (MULTIVITAMIN) tablet Take 1 tablet by mouth daily.      . naproxen (NAPROSYN) 250 MG tablet Take 500 mg by mouth 2 (two) times daily as needed for headache.      . Omega-3 Fatty Acids (FISH OIL) 1000 MG CAPS Take 1 capsule by mouth daily.        ROS   Blood pressure 130/73, pulse 96, temperature 98 F (36.7 C), temperature source Oral, resp. rate 20, SpO2 100.00%. Physical Exam A&O x 3. Appears  uncomfortable and in pain. HEENT neg Lungs CTA bilat CV RRR, S1S2 normal Abdo - soft, minimally tender, active bowel sounds Extr no edema/ tenderness Pelvic - deferred   Results for orders placed during the hospital encounter of 09/18/13 (from the past 24 hour(s))  CBC WITH DIFFERENTIAL     Status: Abnormal   Collection Time    09/18/13  4:31 AM      Result Value Ref Range   WBC 13.5 (*) 4.0 - 10.5 K/uL   RBC 3.92  3.87 - 5.11 MIL/uL   Hemoglobin 12.0  12.0 - 15.0 g/dL   HCT 35.1 (*) 36.0 - 46.0 %   MCV 89.5  78.0 - 100.0 fL   MCH 30.6  26.0 - 34.0 pg   MCHC 34.2  30.0 - 36.0 g/dL   RDW 12.5  11.5 - 15.5 %   Platelets 366  150 - 400 K/uL   Neutrophils Relative % 78 (*) 43 - 77 %   Neutro Abs 10.6 (*) 1.7 - 7.7 K/uL   Lymphocytes Relative 10 (*) 12 - 46 %   Lymphs Abs 1.4  0.7 - 4.0 K/uL   Monocytes Relative 10  3 - 12 %   Monocytes Absolute 1.3 (*) 0.1 - 1.0 K/uL   Eosinophils Relative 2  0 - 5 %   Eosinophils Absolute 0.3  0.0 - 0.7 K/uL   Basophils Relative 0  0 - 1 %   Basophils Absolute 0.0  0.0 - 0.1 K/uL  COMPREHENSIVE METABOLIC PANEL     Status: Abnormal   Collection Time    09/18/13  4:31 AM      Result Value Ref Range   Sodium 139  137 - 147 mEq/L   Potassium 4.5  3.7 - 5.3 mEq/L   Chloride 101  96 - 112 mEq/L   CO2 24  19 - 32 mEq/L   Glucose, Bld 123 (*) 70 - 99 mg/dL   BUN 12  6 - 23 mg/dL   Creatinine, Ser 0.71  0.50 - 1.10 mg/dL   Calcium 9.4  8.4 - 10.5 mg/dL   Total Protein 7.4  6.0 - 8.3 g/dL   Albumin 3.5  3.5 - 5.2 g/dL   AST 27  0 - 37 U/L   ALT 45 (*) 0 - 35 U/L   Alkaline Phosphatase 64  39 - 117 U/L   Total Bilirubin 0.4  0.3 - 1.2 mg/dL   GFR calc non Af Amer >90  >90 mL/min   GFR calc Af Amer >90  >90 mL/min   CT-scan of the abdomen reviewed, possible  abscess  Assessment/Plan: 57 yo, s/p daVinci hysterectomy, presenting with colicky abdominal pain and not a normal bowel movement since surgery. No vomiting but limiting diet.  Pelvic  abscess vs seroma. Plan IV Cefoxitin, Flagyl and reassess with CT if possible to drain. Spoke with Dr Marily Lente, posterior collection above cuff can be approached transgluteal but not anterior collection since close to lateral pelvic wall.   Mana Morison R 09/18/2013, 7:18 AM

## 2013-09-18 NOTE — Progress Notes (Signed)
Dr. Benjie Karvonen notified of lab results; CT won't be done until 7 am. Per Dr. Benjie Karvonen, MAU provider to see patient for medical screening exam.

## 2013-09-18 NOTE — MAU Provider Note (Signed)
Reviewed and agree with note and plan. V.Shealeigh Dunstan, MD  

## 2013-09-18 NOTE — MAU Note (Signed)
S/p hysterectomy on 2/5. Seen in office 2/13 for constipation & told to use miralax & mag citrate. Has been using miralax & mag citrate this weekend; vaginal & rectal bleeding with bowel movement; abdominal & rectal pain that has significantly worsened tonight. Denies n/v, fever, urinary complaints.

## 2013-09-19 LAB — CBC WITH DIFFERENTIAL/PLATELET
Basophils Absolute: 0.1 10*3/uL (ref 0.0–0.1)
Basophils Relative: 0 % (ref 0–1)
Eosinophils Absolute: 0.3 10*3/uL (ref 0.0–0.7)
Eosinophils Relative: 2 % (ref 0–5)
HCT: 30.1 % — ABNORMAL LOW (ref 36.0–46.0)
Hemoglobin: 10 g/dL — ABNORMAL LOW (ref 12.0–15.0)
Lymphocytes Relative: 13 % (ref 12–46)
Lymphs Abs: 1.8 10*3/uL (ref 0.7–4.0)
MCH: 30.1 pg (ref 26.0–34.0)
MCHC: 33.2 g/dL (ref 30.0–36.0)
MCV: 90.7 fL (ref 78.0–100.0)
Monocytes Absolute: 1.6 10*3/uL — ABNORMAL HIGH (ref 0.1–1.0)
Monocytes Relative: 12 % (ref 3–12)
Neutro Abs: 9.7 10*3/uL — ABNORMAL HIGH (ref 1.7–7.7)
Neutrophils Relative %: 73 % (ref 43–77)
Platelets: 328 10*3/uL (ref 150–400)
RBC: 3.32 MIL/uL — ABNORMAL LOW (ref 3.87–5.11)
RDW: 12.6 % (ref 11.5–15.5)
WBC: 13.4 10*3/uL — ABNORMAL HIGH (ref 4.0–10.5)

## 2013-09-19 NOTE — Progress Notes (Signed)
Patient ID: [name], female   DOB: 12-Nov-1955, 58 y.o.   MRN: [id number] Subjective: Pain is better, able to eat and move well. Last Dilaudid used 7.30 pm last night. C/o vaginal bleeding, didn't mention yesterday probably due to pain. C/o light vaginal bleeding since 2/14, started since trying bowel treatment with Fleet and mag-citratenot heavy but is fresh red bleeding. C/o sweats at night since surgery.. Low grade fever of 100.1 yesterday evening but none since then. Eating better, colicky pain is better. Patient is teary, afraid of pain.    Objective: Vital signs in last 24 hours: Temp:  [98.2 F (36.8 C)-100.1 F (37.8 C)] 98.4 F (36.9 C) (02/17 0554) Pulse Rate:  [71-91] 71 (02/17 0554) Resp:  [18] 18 (02/17 0554) BP: (112-132)/(57-67) 112/67 mmHg (02/17 0554) SpO2:  [96 %-100 %] 100 % (02/17 0554) Weight:  [166 lb (75.297 kg)] 166 lb (75.297 kg) (02/16 1940) Weight change:  Last BM Date: 09/18/13  Intake/Output from previous day: 02/16 0701 - 02/17 0700 In: 1138.5 [P.O.:240; I.V.:898.5] Out: 1700 [Urine:1700]  Physical exam:  A&O x 3, no acute distress. Pleasant HEENT neg, no thyromegaly Lungs CTA bilat CV RRR, S1S2 normal Abdo soft, non tender, non acute, Active bowel sounds.  Extr no edema/ tenderness Pelvic Speculum exam - viscous bloody discharge noted. No active bleeding. Sutures feel intact, no bowel herniation noted.   Lab Results:  Recent Labs  09/18/13 0431 09/19/13 0537  WBC 13.5* 13.4*  HGB 12.0 10.0*  HCT 35.1* 30.1*  PLT 366 328   Studies/Results:  CT reviewed with IR on call. Possible to drain 5x3 cm collection transgluteal but others are difficult to access and does not recommend. This drainage would help determine what collection is it and may be culture it but would not help symptomatically much since small collection.  Bowels and ureters intact. No stool impaction seen.    Assessment/Plan:  LOS: 1 day  Post-op day 12, admitted with  constipation, colicky pain and possible pelvic abscess v/s seroma. Slight vaginal bleeding but likely draining the collection and should resolve in few days. Continue IV abx, Cefoxitin and Flagyl, CBC stable, repeat tomorrow. Pain management with Ibuprofen for now and Dilaudid prn. Stop IVfluids. RN informed to save pads to assess in AM. Anticipate D/c home tomorrow if continues to improve.   Aeron Lheureux R 09/19/2013, 10:42 AM

## 2013-09-20 LAB — CBC WITH DIFFERENTIAL/PLATELET
Basophils Absolute: 0.1 10*3/uL (ref 0.0–0.1)
Basophils Relative: 1 % (ref 0–1)
Eosinophils Absolute: 0.5 10*3/uL (ref 0.0–0.7)
Eosinophils Relative: 4 % (ref 0–5)
HCT: 29.9 % — ABNORMAL LOW (ref 36.0–46.0)
Hemoglobin: 9.9 g/dL — ABNORMAL LOW (ref 12.0–15.0)
Lymphocytes Relative: 11 % — ABNORMAL LOW (ref 12–46)
Lymphs Abs: 1.5 10*3/uL (ref 0.7–4.0)
MCH: 29.8 pg (ref 26.0–34.0)
MCHC: 33.1 g/dL (ref 30.0–36.0)
MCV: 90.1 fL (ref 78.0–100.0)
Monocytes Absolute: 1.4 10*3/uL — ABNORMAL HIGH (ref 0.1–1.0)
Monocytes Relative: 11 % (ref 3–12)
Neutro Abs: 9.8 10*3/uL — ABNORMAL HIGH (ref 1.7–7.7)
Neutrophils Relative %: 74 % (ref 43–77)
Platelets: 321 10*3/uL (ref 150–400)
RBC: 3.32 MIL/uL — ABNORMAL LOW (ref 3.87–5.11)
RDW: 12.4 % (ref 11.5–15.5)
WBC: 13.2 10*3/uL — ABNORMAL HIGH (ref 4.0–10.5)

## 2013-09-20 MED ORDER — LEVOFLOXACIN 500 MG PO TABS: 500.0000 mg | ORAL_TABLET | Freq: Two times a day (BID) | ORAL | Status: DC

## 2013-09-20 MED ORDER — DOCUSATE SODIUM 100 MG PO CAPS: 100.0000 mg | ORAL_CAPSULE | Freq: Two times a day (BID) | ORAL | Status: DC

## 2013-09-20 MED ORDER — ESTRADIOL 0.05 MG/24HR TD PTTW: 2.0000 | MEDICATED_PATCH | TRANSDERMAL | Status: DC

## 2013-09-20 MED ORDER — METRONIDAZOLE 500 MG PO TABS: 500.0000 mg | ORAL_TABLET | Freq: Two times a day (BID) | ORAL | Status: DC

## 2013-09-20 MED ORDER — OXYCODONE-ACETAMINOPHEN 5-325 MG PO TABS: 1.0000 | ORAL_TABLET | Freq: Four times a day (QID) | ORAL | Status: DC | PRN

## 2013-09-20 MED ORDER — IBUPROFEN 200 MG PO TABS: 600.0000 mg | ORAL_TABLET | Freq: Four times a day (QID) | ORAL | Status: DC | PRN

## 2013-09-20 NOTE — Progress Notes (Signed)
Patient ID: [name], female   DOB: June 05, 1956, 58 y.o.   MRN: [id number] Feels better, still getting night sweats. Increase ERT dose at home. No vomiting. No fever. Vaginal bleeding unchanged  Objective: Vital signs in last 24 hours: Temp:  [97.5 F (36.4 C)-98.7 F (37.1 C)] 97.5 F (36.4 C) (02/18 0515) Pulse Rate:  [55-75] 55 (02/18 0515) Resp:  [18] 18 (02/18 0515) BP: (88-118)/(50-68) 118/58 mmHg (02/18 0515) SpO2:  [100 %] 100 % (02/18 0515) Weight change:  Last BM Date: 09/19/13 (large watery BM)   A&O x 3, no acute distress. HEENT neg.  Lungs CTA bilat CV RRR, S1S2 normal Abdo soft, non tender, non acute, no rebound/guarding Extr no edema/ tenderness Pelvic exam deferred, continue slight bleeding/thick discharge  Lab Results: WBC unchanged, H/H stable  Recent Labs  09/19/13 0537 09/20/13 0502  WBC 13.4* 13.2*  HGB 10.0* 9.9*  HCT 30.1* 29.9*  PLT 328 321    Assessment/Plan:  LOS: 2 days  Post-op constipation and possible pelvic abscess with pain. Improved, D/c home today. Rx Levofloxacillin and Flagyl x 1 wk. F/up office 1 wk. Precautions reviewed, continue pelvic and lifting restrictions.  Increase ERT to 0.1 mg patch.   Dacy Enrico R 09/20/2013, 8:53 AM

## 2013-09-20 NOTE — Progress Notes (Signed)
Pt is discharged in the care of husband, with N.T. Escort. Denies any pain or discomfort. Stable. Discharge instructions  With Rx were given to pt. With good understanding Questions were asked and answered. No excessive vaginal bleeding or pain.

## 2013-09-20 NOTE — Discharge Summary (Signed)
Physician Discharge Summary  Patient ID: [name] MRN: [id number] DOB/AGE: Feb 14, 1956 58 y.o.  Admit date: 09/18/2013 Discharge date: 09/20/2013  Admission Diagnoses: Post-operative abdominal pain, constipation, pelvic abscess  Discharge Diagnoses: Same  Discharged Condition: good, improved  Hospital Course: 48 hrs of antibioitcs- Cefoxitin, Flagyl, CBCD remained stable. Vaginal bleeding noted but exam noted intact vaginal vault suture line.   Significant Diagnostic Studies: CT scan (see report)  Discharge Exam: Blood pressure 118/58, pulse 55, temperature 97.5 F (36.4 C), temperature source Oral, resp. rate 18, height 5\' 6"  (1.676 m), weight 166 lb (75.297 kg), SpO2 100.00%. Abdomen soft, non tender Pelvic - slight bleeding but intact vaginal vault sutures  Disposition: 01-Home or Self Care  Discharge Orders   Future Appointments Provider Department Dept Phone   10/24/2013 2:15 PM Stefanie Libel, MD [facility] [phone]   Future Orders Complete By Expires   Call MD for:  difficulty breathing, headache or visual disturbances  As directed    Call MD for:  extreme fatigue  As directed    Call MD for:  hives  As directed    Call MD for:  persistant dizziness or light-headedness  As directed    Call MD for:  persistant nausea and vomiting  As directed    Call MD for:  redness, tenderness, or signs of infection (pain, swelling, redness, odor or green/yellow discharge around incision site)  As directed    Call MD for:  severe uncontrolled pain  As directed    Call MD for:  temperature >100.4  As directed    Call MD for:  As directed    Comments:     Heavier vaginal bleeding   Diet - low sodium heart healthy  As directed    Increase activity slowly  As directed    Lifting restrictions  As directed    Comments:     10 lbs only for 6 wks   Sexual Activity Restrictions  As directed    Comments:     6 wks       Medication List         ascorbic  acid 1000 MG tablet  Commonly known as:  VITAMIN C  Take 1,000 mg by mouth daily.     b complex vitamins tablet  Take 1 tablet by mouth daily.     CALCIUM 600 + D PO  Take 1 tablet by mouth daily.     dimenhyDRINATE 50 MG tablet  Commonly known as:  DRAMAMINE  Take 50 mg by mouth every 8 (eight) hours as needed (for sleep).     docusate sodium 100 MG capsule  Commonly known as:  COLACE  Take 1 capsule (100 mg total) by mouth 2 (two) times daily.     DRAMAMINE II 25 MG tablet  Generic drug:  meclizine  Take 25 mg by mouth at bedtime.     estradiol 0.05 MG/24HR patch  Commonly known as:  VIVELLE-DOT  Place 2 patches (0.1 mg total) onto the skin 2 (two) times a week. Wed & Sat     ferrous sulfate 325 (65 FE) MG tablet  Take 325 mg by mouth daily with breakfast.     fexofenadine-pseudoephedrine 60-120 MG per tablet  Commonly known as:  ALLEGRA-D  Take 1 tablet by mouth daily.     Fish Oil 1000 MG Caps  Take 1 capsule by mouth daily.     fluticasone 50 MCG/ACT nasal spray  Commonly known as:  FLONASE  Place 2 sprays into both nostrils daily.     ibuprofen 200 MG tablet  Commonly known as:  ADVIL,MOTRIN  Take 600 mg by mouth every 6 (six) hours as needed for moderate pain.     ibuprofen 200 MG tablet  Commonly known as:  ADVIL  Take 3 tablets (600 mg total) by mouth every 6 (six) hours as needed.     levofloxacin 500 MG tablet  Commonly known as:  LEVAQUIN  Take 1 tablet (500 mg total) by mouth 2 (two) times daily.     magnesium citrate Soln  Take 1 Bottle by mouth once. Pt took 5 oz. One day and 2 oz. The next.     metroNIDAZOLE 500 MG tablet  Commonly known as:  FLAGYL  Take 1 tablet (500 mg total) by mouth 2 (two) times daily.     montelukast 10 MG tablet  Commonly known as:  SINGULAIR  Take 10 mg by mouth at bedtime.     multivitamin tablet  Take 1 tablet by mouth daily.     oxyCODONE-acetaminophen 5-325 MG per tablet  Commonly known as:   PERCOCET/ROXICET  Take 1 tablet by mouth every 6 (six) hours as needed for severe pain (moderate to severe pain (when tolerating fluids)).     polyethylene glycol packet  Commonly known as:  MIRALAX / GLYCOLAX  Take 17 g by mouth daily.         Signed: Laiken Sandy R 09/20/2013, 9:03 AM

## 2013-09-25 ENCOUNTER — Encounter (HOSPITAL_COMMUNITY)
Admission: RE | Disposition: A | Discharge status: Home / Self Care | Payer: Self-pay | Source: Ambulatory Visit | Attending: Obstetrics & Gynecology

## 2013-09-25 ENCOUNTER — Encounter (HOSPITAL_COMMUNITY): Payer: 59 | Admitting: Anesthesiology

## 2013-09-25 ENCOUNTER — Encounter (HOSPITAL_COMMUNITY): Payer: Self-pay | Admitting: *Deleted

## 2013-09-25 ENCOUNTER — Ambulatory Visit (HOSPITAL_COMMUNITY)
Admission: RE | Admit: 2013-09-25 | Discharge: 2013-09-25 | Disposition: A | Discharge status: Home / Self Care | Payer: 59 | Source: Ambulatory Visit | Attending: Obstetrics & Gynecology | Admitting: Obstetrics & Gynecology

## 2013-09-25 ENCOUNTER — Other Ambulatory Visit: Payer: Self-pay | Admitting: Obstetrics & Gynecology

## 2013-09-25 ENCOUNTER — Ambulatory Visit (HOSPITAL_COMMUNITY): Payer: 59 | Admitting: Anesthesiology

## 2013-09-25 DIAGNOSIS — K660 Peritoneal adhesions (postprocedural) (postinfection): Secondary | ICD-10-CM

## 2013-09-25 DIAGNOSIS — N731 Chronic parametritis and pelvic cellulitis: Secondary | ICD-10-CM | POA: Insufficient documentation

## 2013-09-25 DIAGNOSIS — D649 Anemia, unspecified: Secondary | ICD-10-CM | POA: Insufficient documentation

## 2013-09-25 DIAGNOSIS — IMO0002 Reserved for concepts with insufficient information to code with codable children: Secondary | ICD-10-CM | POA: Insufficient documentation

## 2013-09-25 DIAGNOSIS — Z87891 Personal history of nicotine dependence: Secondary | ICD-10-CM | POA: Insufficient documentation

## 2013-09-25 DIAGNOSIS — Y836 Removal of other organ (partial) (total) as the cause of abnormal reaction of the patient, or of later complication, without mention of misadventure at the time of the procedure: Secondary | ICD-10-CM | POA: Insufficient documentation

## 2013-09-25 DIAGNOSIS — N739 Female pelvic inflammatory disease, unspecified: Secondary | ICD-10-CM

## 2013-09-25 HISTORY — PX: LAPAROSCOPY: SHX197

## 2013-09-25 LAB — CBC
HCT: 34.2 % — ABNORMAL LOW (ref 36.0–46.0)
Hemoglobin: 11.5 g/dL — ABNORMAL LOW (ref 12.0–15.0)
MCH: 30 pg (ref 26.0–34.0)
MCHC: 33.6 g/dL (ref 30.0–36.0)
MCV: 89.3 fL (ref 78.0–100.0)
Platelets: 500 10*3/uL — ABNORMAL HIGH (ref 150–400)
RBC: 3.83 MIL/uL — ABNORMAL LOW (ref 3.87–5.11)
RDW: 12.6 % (ref 11.5–15.5)
WBC: 12.9 10*3/uL — ABNORMAL HIGH (ref 4.0–10.5)

## 2013-09-25 LAB — BASIC METABOLIC PANEL
BUN: 10 mg/dL (ref 6–23)
CO2: 25 mEq/L (ref 19–32)
Calcium: 9.4 mg/dL (ref 8.4–10.5)
Chloride: 96 mEq/L (ref 96–112)
Creatinine, Ser: 0.85 mg/dL (ref 0.50–1.10)
GFR calc Af Amer: 87 mL/min — ABNORMAL LOW (ref >90)
GFR calc non Af Amer: 75 mL/min — ABNORMAL LOW (ref >90)
Glucose, Bld: 99 mg/dL (ref 70–99)
Potassium: 3.6 mEq/L — ABNORMAL LOW (ref 3.7–5.3)
Sodium: 135 mEq/L — ABNORMAL LOW (ref 137–147)

## 2013-09-25 LAB — DIFFERENTIAL
Basophils Absolute: 0 10*3/uL (ref 0.0–0.1)
Basophils Relative: 0 % (ref 0–1)
Eosinophils Absolute: 0.3 10*3/uL (ref 0.0–0.7)
Eosinophils Relative: 3 % (ref 0–5)
Lymphocytes Relative: 19 % (ref 12–46)
Lymphs Abs: 2.3 10*3/uL (ref 0.7–4.0)
Monocytes Absolute: 0.5 10*3/uL (ref 0.1–1.0)
Monocytes Relative: 4 % (ref 3–12)
Neutro Abs: 8.9 10*3/uL — ABNORMAL HIGH (ref 1.7–7.7)
Neutrophils Relative %: 74 % (ref 43–77)

## 2013-09-25 SURGERY — LAPAROSCOPY OPERATIVE
Anesthesia: General | Site: Abdomen

## 2013-09-25 MED ORDER — DEXAMETHASONE SODIUM PHOSPHATE 10 MG/ML IJ SOLN
INTRAMUSCULAR | Status: DC | PRN
  Administered 2013-09-25: 6 mg via INTRAVENOUS

## 2013-09-25 MED ORDER — 0.9 % SODIUM CHLORIDE (POUR BTL) OPTIME
TOPICAL | Status: DC | PRN
  Administered 2013-09-25: 1000 mL

## 2013-09-25 MED ORDER — OXYCODONE HCL 5 MG/5ML PO SOLN: 5.0000 mg | Freq: Once | ORAL | Status: AC | PRN

## 2013-09-25 MED ORDER — MIDAZOLAM HCL 2 MG/2ML IJ SOLN: INTRAMUSCULAR | Status: DC | PRN

## 2013-09-25 MED ORDER — BUPIVACAINE HCL (PF) 0.25 % IJ SOLN
INTRAMUSCULAR | Status: AC
  Filled 2013-09-25: qty 30

## 2013-09-25 MED ORDER — OXYCODONE HCL 5 MG PO TABS: 5.0000 mg | ORAL_TABLET | Freq: Once | ORAL | Status: DC | PRN

## 2013-09-25 MED ORDER — ACETAMINOPHEN 160 MG/5ML PO SOLN: 325.0000 mg | ORAL | Status: DC | PRN

## 2013-09-25 MED ORDER — ONDANSETRON HCL 4 MG/2ML IJ SOLN
INTRAMUSCULAR | Status: DC | PRN
  Administered 2013-09-25: 4 mg via INTRAVENOUS

## 2013-09-25 MED ORDER — DEXAMETHASONE SODIUM PHOSPHATE 10 MG/ML IJ SOLN
INTRAMUSCULAR | Status: AC
  Filled 2013-09-25: qty 1

## 2013-09-25 MED ORDER — FENTANYL CITRATE 0.05 MG/ML IJ SOLN
INTRAMUSCULAR | Status: AC
  Filled 2013-09-25: qty 2

## 2013-09-25 MED ORDER — GLYCOPYRROLATE 0.2 MG/ML IJ SOLN
INTRAMUSCULAR | Status: AC
  Filled 2013-09-25: qty 2

## 2013-09-25 MED ORDER — FENTANYL CITRATE 0.05 MG/ML IJ SOLN
25.0000 ug | INTRAMUSCULAR | Status: DC | PRN
  Administered 2013-09-25 (×3): 50 ug via INTRAVENOUS

## 2013-09-25 MED ORDER — ONDANSETRON HCL 4 MG/2ML IJ SOLN: 4.0000 mg | Freq: Once | INTRAMUSCULAR | Status: DC | PRN

## 2013-09-25 MED ORDER — GLYCOPYRROLATE 0.2 MG/ML IJ SOLN
INTRAMUSCULAR | Status: DC | PRN
  Administered 2013-09-25: 0.4 mg via INTRAVENOUS

## 2013-09-25 MED ORDER — LACTATED RINGERS IV SOLN
INTRAVENOUS | Status: DC
  Administered 2013-09-25 (×2): via INTRAVENOUS

## 2013-09-25 MED ORDER — SODIUM CHLORIDE 0.9 % IJ SOLN
INTRAMUSCULAR | Status: AC
  Filled 2013-09-25: qty 10

## 2013-09-25 MED ORDER — MIDAZOLAM HCL 2 MG/2ML IJ SOLN
INTRAMUSCULAR | Status: AC
  Filled 2013-09-25: qty 2

## 2013-09-25 MED ORDER — LIDOCAINE HCL (CARDIAC) 20 MG/ML IV SOLN
INTRAVENOUS | Status: DC | PRN
  Administered 2013-09-25: 50 mg via INTRAVENOUS

## 2013-09-25 MED ORDER — LACTATED RINGERS IR SOLN
Status: DC | PRN
  Administered 2013-09-25: 3000 mL

## 2013-09-25 MED ORDER — ROCURONIUM BROMIDE 100 MG/10ML IV SOLN
INTRAVENOUS | Status: DC | PRN
  Administered 2013-09-25: 10 mg via INTRAVENOUS
  Administered 2013-09-25: 40 mg via INTRAVENOUS

## 2013-09-25 MED ORDER — OXYCODONE-ACETAMINOPHEN 5-325 MG PO TABS
ORAL_TABLET | ORAL | Status: AC
  Filled 2013-09-25: qty 1

## 2013-09-25 MED ORDER — ROCURONIUM BROMIDE 100 MG/10ML IV SOLN
INTRAVENOUS | Status: AC
  Filled 2013-09-25: qty 1

## 2013-09-25 MED ORDER — METRONIDAZOLE 500 MG PO TABS: 500.0000 mg | ORAL_TABLET | Freq: Two times a day (BID) | ORAL | Status: DC

## 2013-09-25 MED ORDER — KETOROLAC TROMETHAMINE 30 MG/ML IJ SOLN
INTRAMUSCULAR | Status: DC | PRN
  Administered 2013-09-25: 30 mg via INTRAVENOUS

## 2013-09-25 MED ORDER — MIDAZOLAM HCL 2 MG/2ML IJ SOLN
INTRAMUSCULAR | Status: DC | PRN
  Administered 2013-09-25: 2 mg via INTRAVENOUS

## 2013-09-25 MED ORDER — PROPOFOL 10 MG/ML IV EMUL
INTRAVENOUS | Status: AC
  Filled 2013-09-25: qty 20

## 2013-09-25 MED ORDER — NEOSTIGMINE METHYLSULFATE 1 MG/ML IJ SOLN
INTRAMUSCULAR | Status: DC | PRN
  Administered 2013-09-25: 3 mg via INTRAVENOUS

## 2013-09-25 MED ORDER — PROPOFOL 10 MG/ML IV BOLUS
INTRAVENOUS | Status: DC | PRN
  Administered 2013-09-25: 150 mg via INTRAVENOUS
  Administered 2013-09-25: 20 mg via INTRAVENOUS

## 2013-09-25 MED ORDER — LIDOCAINE HCL (CARDIAC) 20 MG/ML IV SOLN
INTRAVENOUS | Status: AC
  Filled 2013-09-25: qty 5

## 2013-09-25 MED ORDER — ACETAMINOPHEN 325 MG PO TABS: 325.0000 mg | ORAL_TABLET | ORAL | Status: DC | PRN

## 2013-09-25 MED ORDER — LEVOFLOXACIN 500 MG PO TABS: 500.0000 mg | ORAL_TABLET | Freq: Two times a day (BID) | ORAL | Status: DC

## 2013-09-25 MED ORDER — CEFAZOLIN SODIUM-DEXTROSE 2-3 GM-% IV SOLR
2.0000 g | INTRAVENOUS | Status: AC
  Administered 2013-09-25: 2 g via INTRAVENOUS

## 2013-09-25 MED ORDER — OXYCODONE HCL 5 MG PO TABS
5.0000 mg | ORAL_TABLET | Freq: Once | ORAL | Status: AC | PRN
  Administered 2013-09-25: 5 mg via ORAL
  Filled 2013-09-25: qty 1

## 2013-09-25 MED ORDER — NEOSTIGMINE METHYLSULFATE 1 MG/ML IJ SOLN
INTRAMUSCULAR | Status: AC
  Filled 2013-09-25: qty 1

## 2013-09-25 MED ORDER — FENTANYL CITRATE 0.05 MG/ML IJ SOLN
INTRAMUSCULAR | Status: DC | PRN
  Administered 2013-09-25 (×4): 50 ug via INTRAVENOUS

## 2013-09-25 SURGICAL SUPPLY — 36 items
CABLE HIGH FREQUENCY MONO STRZ (ELECTRODE) IMPLANT
CATH FOLEY 2WAY SLVR  5CC 14FR (CATHETERS) ×1
CATH FOLEY 2WAY SLVR 5CC 14FR (CATHETERS) ×1 IMPLANT
CATH ROBINSON RED A/P 16FR (CATHETERS) ×2 IMPLANT
CHLORAPREP W/TINT 26ML (MISCELLANEOUS) ×2 IMPLANT
CLOTH BEACON ORANGE TIMEOUT ST (SAFETY) ×2 IMPLANT
DERMABOND ADVANCED (GAUZE/BANDAGES/DRESSINGS) ×1
DERMABOND ADVANCED .7 DNX12 (GAUZE/BANDAGES/DRESSINGS) ×1 IMPLANT
EVACUATOR SMOKE 8.L (FILTER) IMPLANT
FORCEPS CUTTING 33CM 5MM (CUTTING FORCEPS) IMPLANT
FORCEPS CUTTING 45CM 5MM (CUTTING FORCEPS) IMPLANT
GLOVE BIO SURGEON STRL SZ7 (GLOVE) ×2 IMPLANT
GLOVE BIOGEL PI IND STRL 7.0 (GLOVE) ×1 IMPLANT
GLOVE BIOGEL PI INDICATOR 7.0 (GLOVE) ×1
GLOVE INDICATOR 7.0 STRL GRN (GLOVE) ×2 IMPLANT
GLOVE SKINSENSE NS SZ7.0 (GLOVE) ×4
GLOVE SKINSENSE STRL SZ7.0 (GLOVE) ×4 IMPLANT
GOWN STRL REUS W/TWL LRG LVL3 (GOWN DISPOSABLE) ×8 IMPLANT
MANIPULATOR UTERINE 4.5 ZUMI (MISCELLANEOUS) IMPLANT
NEEDLE INSUFFLATION 120MM (ENDOMECHANICALS) IMPLANT
NS IRRIG 1000ML POUR BTL (IV SOLUTION) ×2 IMPLANT
PACK LAPAROSCOPY BASIN (CUSTOM PROCEDURE TRAY) ×2 IMPLANT
PACK VAGINAL MINOR WOMEN LF (CUSTOM PROCEDURE TRAY) ×2 IMPLANT
POUCH SPECIMEN RETRIEVAL 10MM (ENDOMECHANICALS) IMPLANT
PROTECTOR NERVE ULNAR (MISCELLANEOUS) ×2 IMPLANT
SCISSORS LAP 5X35 DISP (ENDOMECHANICALS) IMPLANT
SET IRRIG TUBING LAPAROSCOPIC (IRRIGATION / IRRIGATOR) ×2 IMPLANT
SOLUTION ELECTROLUBE (MISCELLANEOUS) IMPLANT
SUT VICRYL 0 UR6 27IN ABS (SUTURE) ×2 IMPLANT
SUT VICRYL 4-0 PS2 18IN ABS (SUTURE) ×2 IMPLANT
TOWEL OR 17X24 6PK STRL BLUE (TOWEL DISPOSABLE) ×4 IMPLANT
TROCAR BALLN 12MMX100 BLUNT (TROCAR) IMPLANT
TROCAR XCEL NON-BLD 11X100MML (ENDOMECHANICALS) ×2 IMPLANT
TROCAR XCEL NON-BLD 5MMX100MML (ENDOMECHANICALS) ×2 IMPLANT
WARMER LAPAROSCOPE (MISCELLANEOUS) ×2 IMPLANT
WATER STERILE IRR 1000ML POUR (IV SOLUTION) ×2 IMPLANT

## 2013-09-25 NOTE — Anesthesia Procedure Notes (Signed)
Procedure Name: Intubation Date/Time: 09/25/2013 5:25 PM Performed by: Jonna Munro Pre-anesthesia Checklist: Patient identified, Emergency Drugs available, Suction available, Patient being monitored and Timeout performed Patient Re-evaluated:Patient Re-evaluated prior to inductionOxygen Delivery Method: Circle system utilized Preoxygenation: Pre-oxygenation with 100% oxygen Intubation Type: IV induction Ventilation: Mask ventilation without difficulty Laryngoscope Size: Mac and 3 Grade View: Grade I Tube type: Oral Tube size: 7.0 mm Number of attempts: 1 Airway Equipment and Method: Stylet Secured at: 22 cm Tube secured with: Tape Dental Injury: Teeth and Oropharynx as per pre-operative assessment

## 2013-09-25 NOTE — Consult Note (Signed)
History: I was asked by Dr. Benjie Karvonen to evaluate this patient intraoperatively. She is status post laparoscopic hysterectomy for postmenopausal bleeding on February 5. Her initial postoperative course was uncomplicated but she was readmitted with pain about one week postop CT scan showed pelvic fluid collections not drainable but no bowel obstruction or other bowel abnormalities. The patient was treated with IV antibiotics and improved and was discharged on oral antibiotics. She however continued to have some lower abdominal pain but was eating okay and moving her bowels okay. She was examined by Dr. Benjie Karvonen in the office and was apparently found to have a partial disruption of her vaginal cuff. She was brought to the operating room to consider repair of her vaginal cuff. Laparoscopy was performed finding very dense adhesions of the small bowel and colon down into the pelvis and I was asked to see the patient for intraoperative consult regarding possibly taking down these adhesions to expose the vaginal cuff.  Operative findings: There was mild diffuse dilatation of the small bowel consistent with mild ileus. In the pelvis there were noted to be dense adhesions toward the pelvic floor involving the sigmoid colon and at least a couple of loops of small bowel. No kinking or obstruction. No evidence of purulence or GI contents. Minimal adhesion lysis was performed carefully with hydrodissection but these adhesions were extremely dense and inflammatory.  Assessment and recommendations: Without a clear necessity to take down these adhesions such as perforation or fistula or obstruction ( and there was no evidence of these ) I felt that attempts to mobilize the bowel out of the pelvis is more likely to result in injury than to benefit the patient. After discussion with Dr. Lisbeth Renshaw we elected to leave these adhesions in place and treat the patient expectantly with close observation as she overall is improving.  We would be  happy to reevaluate at any time as needed.   Edward Jolly MD, FACS  09/25/2013, 6:54 PM

## 2013-09-25 NOTE — Anesthesia Postprocedure Evaluation (Signed)
  Anesthesia Post-op Note  Patient: [name]  Procedure(s) Performed: Procedure(s): LAPAROSCOPY OPERATIVE (N/A)  Patient Location: PACU  Anesthesia Type:General  Level of Consciousness: awake, alert  and oriented  Airway and Oxygen Therapy: Patient Spontanous Breathing  Post-op Pain: mild   Post-op Assessment: Post-op Vital signs reviewed, Patient's Cardiovascular Status Stable, Respiratory Function Stable, Patent Airway, No signs of Nausea or vomiting and Pain level controlled  Post-op Vital Signs: Reviewed and stable  Complications: No apparent anesthesia complications and Patient re-intubated

## 2013-09-25 NOTE — Anesthesia Preprocedure Evaluation (Signed)
Anesthesia Evaluation  Patient identified by MRN, date of birth, ID band Patient awake    Reviewed: Allergy & Precautions, H&P , NPO status , Patient's Chart, lab work & pertinent test results  History of Anesthesia Complications (+) PONV  Airway Mallampati: II TM Distance: >3 FB Neck ROM: Full    Dental  (+) Teeth Intact   Pulmonary former smoker,    Pulmonary exam normal       Cardiovascular negative cardio ROS  Rhythm:Regular Rate:Normal     Neuro/Psych negative neurological ROS  negative psych ROS   GI/Hepatic negative GI ROS, Neg liver ROS,   Endo/Other  negative endocrine ROS  Renal/GU negative Renal ROS  negative genitourinary   Musculoskeletal negative musculoskeletal ROS (+)   Abdominal   Peds  Hematology  (+) anemia ,   Anesthesia Other Findings   Reproductive/Obstetrics negative OB ROS                           Anesthesia Physical Anesthesia Plan  ASA: II  Anesthesia Plan: General   Post-op Pain Management:    Induction: Intravenous  Airway Management Planned: Natural Airway  Additional Equipment: None  Intra-op Plan:   Post-operative Plan: Extubation in OR  Informed Consent: I have reviewed the patients History and Physical, chart, labs and discussed the procedure including the risks, benefits and alternatives for the proposed anesthesia with the patient or authorized representative who has indicated his/her understanding and acceptance.   Dental advisory given  Plan Discussed with: CRNA and Surgeon  Anesthesia Plan Comments:         Anesthesia Quick Evaluation

## 2013-09-25 NOTE — H&P (Signed)
[name] is an 58 y.o. female [place] with Robotic assistance on 09/08/13. Presenting today with possible vaginal cuff laceration and vaginal bleeding and discharge since few days, She was admitted for pelvic infection and collection last wk'end and is on Levo/Flagyl, pain is better, eating better and no fever.    No LMP recorded. Patient is postmenopausal.    Past Medical History  Diagnosis Date  . PONV (postoperative nausea and vomiting)   . SVD (spontaneous vaginal delivery)     x 1  . Seasonal allergies   . Anemia   . Fibroids 09/07/2013    Past Surgical History  Procedure Laterality Date  . Colonoscopy    . Dilation and curettage of uterus      x 3  . Wisdom tooth extraction    . Eye surgery      lasik bilateral  . Robotic assisted total hysterectomy with bilateral salpingo oopherectomy Bilateral 09/07/2013    Procedure: ROBOTIC ASSISTED TOTAL HYSTERECTOMY WITH BILATERAL SALPINGO OOPHORECTOMY;  Surgeon: Elveria Royals, MD;  Location: [facility];  Service: Gynecology;  Laterality: Bilateral;    History reviewed. No pertinent family history.  Social History:  reports that she quit smoking about 26 years ago. Her smoking use included Cigarettes. She has a 10 pack-year smoking history. She has never used smokeless tobacco. She reports that she drinks alcohol. She reports that she does not use illicit drugs.  Allergies: No Known Allergies  Prescriptions prior to admission  Medication Sig Dispense Refill  . ascorbic acid (VITAMIN C) 1000 MG tablet Take 1,000 mg by mouth daily.      Marland Kitchen b complex vitamins tablet Take 1 tablet by mouth daily.      . Calcium Carb-Cholecalciferol (CALCIUM 600 + D PO) Take 1 tablet by mouth daily.      Marland Kitchen docusate sodium (COLACE) 100 MG capsule Take 1 capsule (100 mg total) by mouth 2 (two) times daily.  30 capsule  0  . estradiol (VIVELLE-DOT) 0.05 MG/24HR patch Place 2 patches (0.1 mg total) onto the skin 2 (two) times a week. Wed & Sat  8 patch  12  .  ferrous sulfate 325 (65 FE) MG tablet Take 325 mg by mouth daily with breakfast.      . fexofenadine-pseudoephedrine (ALLEGRA-D) 60-120 MG per tablet Take 1 tablet by mouth daily.      . fluticasone (FLONASE) 50 MCG/ACT nasal spray Place 2 sprays into both nostrils daily.      Marland Kitchen ibuprofen (ADVIL) 200 MG tablet Take 3 tablets (600 mg total) by mouth every 6 (six) hours as needed.  30 tablet  0  . Lactobacillus (ACIDOPHILUS PO) Take 1 tablet by mouth daily.      Marland Kitchen levofloxacin (LEVAQUIN) 500 MG tablet Take 1 tablet (500 mg total) by mouth 2 (two) times daily.  14 tablet  0  . magnesium citrate SOLN Take 1 Bottle by mouth once. Pt took 5 oz. One day and 2 oz. The next.      . meclizine (DRAMAMINE II) 25 MG tablet Take 25 mg by mouth at bedtime.      . metroNIDAZOLE (FLAGYL) 500 MG tablet Take 1 tablet (500 mg total) by mouth 2 (two) times daily.  14 tablet  0  . montelukast (SINGULAIR) 10 MG tablet Take 10 mg by mouth at bedtime.      . Multiple Vitamin (MULTIVITAMIN) tablet Take 1 tablet by mouth daily.      . Omega-3 Fatty Acids (FISH  OIL) 1000 MG CAPS Take 1 capsule by mouth daily.      Marland Kitchen oxyCODONE-acetaminophen (PERCOCET/ROXICET) 5-325 MG per tablet Take 1 tablet by mouth every 6 (six) hours as needed for severe pain (moderate to severe pain (when tolerating fluids)).  20 tablet  0  . polyethylene glycol (MIRALAX / GLYCOLAX) packet Take 17 g by mouth daily.        ROS neg   Blood pressure 123/56, pulse 76, temperature 98.2 F (36.8 C), temperature source Oral, resp. rate 18, SpO2 100.00%. Physical Exam A&O x 3, no acute distress. Pleasant HEENT neg, no thyromegaly Lungs CTA bilat CV RRR, S1S2 normal Abdo soft, non tender, non acute, slight LLQ tenderness Extr no edema/ tenderness Pelvic vaginal edges feels separated in one area, no peritoneal defect felt  Results for orders placed during the hospital encounter of 09/25/13 (from the past 24 hour(s))  CBC     Status: Abnormal    Collection Time    09/25/13  3:58 PM      Result Value Ref Range   WBC 12.9 (*) 4.0 - 10.5 K/uL   RBC 3.83 (*) 3.87 - 5.11 MIL/uL   Hemoglobin 11.5 (*) 12.0 - 15.0 g/dL   HCT 34.2 (*) 36.0 - 46.0 %   MCV 89.3  78.0 - 100.0 fL   MCH 30.0  26.0 - 34.0 pg   MCHC 33.6  30.0 - 36.0 g/dL   RDW 12.6  11.5 - 15.5 %   Platelets 500 (*) 150 - 400 K/uL  BASIC METABOLIC PANEL     Status: Abnormal   Collection Time    09/25/13  3:58 PM      Result Value Ref Range   Sodium 135 (*) 137 - 147 mEq/L   Potassium 3.6 (*) 3.7 - 5.3 mEq/L   Chloride 96  96 - 112 mEq/L   CO2 25  19 - 32 mEq/L   Glucose, Bld 99  70 - 99 mg/dL   BUN 10  6 - 23 mg/dL   Creatinine, Ser 0.85  0.50 - 1.10 mg/dL   Calcium 9.4  8.4 - 10.5 mg/dL   GFR calc non Af Amer 75 (*) >90 mL/min   GFR calc Af Amer 87 (*) >90 mL/min    No results found.  Assessment/Plan: Post-op TLH, vaginal bleeding and fluid per vagina, resolving pelvic infection, possible cuff separation/ admitted for exam under anesthesia, possible laparoscopy and suturing as needed.    Dajanee Voorheis R 09/25/2013, 5:06 PM

## 2013-09-25 NOTE — Transfer of Care (Signed)
Immediate Anesthesia Transfer of Care Note  Patient: [name]  Procedure(s) Performed: Procedure(s): LAPAROSCOPY OPERATIVE (N/A)  Patient Location: PACU  Anesthesia Type:General  Level of Consciousness: awake, alert  and oriented  Airway & Oxygen Therapy: Patient Spontanous Breathing and Patient connected to nasal cannula oxygen  Post-op Assessment: Report given to PACU RN and Post -op Vital signs reviewed and stable  Post vital signs: Reviewed and stable  Complications: No apparent anesthesia complications

## 2013-09-26 ENCOUNTER — Encounter (HOSPITAL_COMMUNITY): Payer: Self-pay | Admitting: Obstetrics & Gynecology

## 2013-09-27 NOTE — Op Note (Signed)
Diagnostic Laparoscopy Procedure Note  Indications: The patient is a 57 y.o. female post laparoscopic hysterectomy on 09/07/13 and admitted on 2/16, 2/17 for pelvic abscess and IV antibiotics. She is currently on Lexofloxacin and Flagyl for pelvic abscess but is having vaginal bleeding and passing small clots since 2/21. She is admitted for exam under anesthesia and laparoscopy (possible) to evaluate for cuff separation and re approximation if clear of bowel laparoscopically.   Pre-operative Diagnosis: Post hysterectomy vaginal bleeding  Post-operative Diagnosis: Same, no bowel herniation per vaginal cuff. Dense bowel adhesions on laparoscopy.  Procedure: Examination under anesthesia, diagnostic laparoscopy and irrigation of peritoneal cavity.   Surgeon: Tahesha Skeet R   Assistants: Rolitta Dawson, CNM  Intra-Operative Consultation: Dr Hoxworth  Anesthesia: General endotracheal anesthesia  ASA Class: 2  Procedure Details  The patient was seen in the Holding Room. The risks, benefits, complications, treatment options, and expected outcomes were discussed with the patient. The possibilities of reaction to medication, pulmonary aspiration, perforation of viscus, bleeding, recurrent infection, the need for additional procedures, failure to diagnose a condition, and creating a complication requiring transfusion or operation were discussed with the patient. The patient concurred with the proposed plan, giving informed consent. The patient was taken to the Operating Room, identified as [name] and the procedure verified as Diagnostic Laparoscopy. A Time Out was held and the above information confirmed.  After induction of general anesthesia, the patient was placed in modified dorsal lithotomy position where she was prepped, draped, and catheterized in the normal, sterile fashion.  A 10 mm umbilical incision was then performed and fascia grasped and excised and peritoneal entry made without  complications. Laparoscope introduced and pneumoperitoneum was established. One 5 mm trocar was placed under vision via 5 mm incision. Foley was placed with aseptic precautions. The findings were noted as follows.  Findings: vaginal cuff with partial separation and no evidence of bowel herniation per vaginal exam. Copious clear flaky discharge was noted and V-lock sutures seen but no larger peritoneal opening was noted. Laparoscopic evaluation noted bowel adhesions in pelvis with inflammatory response. Some adhesions separated with hydrodisection. Dr Hoxworth was called in for consult and evaluated dense adhesions. Due to recent infection and dense adhesions bowel adhesions were not separated and peritoneal cavity was washed with saline. Liver appeared normal. Appendix normal. No bowel injury noted.   Following the procedure the umbilical sheath was removed after intra-abdominal carbon dioxide was expressed.  Fascial incision closed with 0-vicry and skin incisions were closed with subcuticular sutures of 4-0 Vicryl. Foley catheter was removed.   Instrument, sponge, and needle counts were correct prior to abdominal closure and at the conclusion of the case.   Estimated Blood Loss:  20cc         Drains: none         Total IV Fluids: 1500 mL LR         Specimens: none            Complications:  None; patient tolerated the procedure well.         Disposition: PACU - hemodynamically stable.         Condition: stable  I performed the surgery. Intra-op consultant Dr Hoxworth.  Sabrina Budlong, MD   

## 2013-09-28 NOTE — Op Note (Signed)
Diagnostic Laparoscopy Procedure Note  Indications: The patient is a 58 y.o. female post laparoscopic hysterectomy on 09/07/13 and admitted on 2/16, 2/17 for pelvic abscess and IV antibiotics. She is currently on Lexofloxacin and Flagyl for pelvic abscess but is having vaginal bleeding and passing small clots since 2/21. She is admitted for exam under anesthesia and laparoscopy (possible) to evaluate for cuff separation and re approximation if clear of bowel laparoscopically.   Pre-operative Diagnosis: Post hysterectomy vaginal bleeding  Post-operative Diagnosis: Same, no bowel herniation per vaginal cuff. Dense bowel adhesions on laparoscopy.  Procedure: Examination under anesthesia, diagnostic laparoscopy and irrigation of peritoneal cavity.   Surgeon: Elveria Royals   Assistants: Laury Deep, CNM  Intra-Operative Consultation: Dr Excell Seltzer  Anesthesia: General endotracheal anesthesia  ASA Class: 2  Procedure Details  The patient was seen in the Holding Room. The risks, benefits, complications, treatment options, and expected outcomes were discussed with the patient. The possibilities of reaction to medication, pulmonary aspiration, perforation of viscus, bleeding, recurrent infection, the need for additional procedures, failure to diagnose a condition, and creating a complication requiring transfusion or operation were discussed with the patient. The patient concurred with the proposed plan, giving informed consent. The patient was taken to the Operating Room, identified as [name] and the procedure verified as Diagnostic Laparoscopy. A Time Out was held and the above information confirmed.  After induction of general anesthesia, the patient was placed in modified dorsal lithotomy position where she was prepped, draped, and catheterized in the normal, sterile fashion.  A 10 mm umbilical incision was then performed and fascia grasped and excised and peritoneal entry made without  complications. Laparoscope introduced and pneumoperitoneum was established. One 5 mm trocar was placed under vision via 5 mm incision. Foley was placed with aseptic precautions. The findings were noted as follows.  Findings: vaginal cuff with partial separation and no evidence of bowel herniation per vaginal exam. Copious clear flaky discharge was noted and V-lock sutures seen but no larger peritoneal opening was noted. Laparoscopic evaluation noted bowel adhesions in pelvis with inflammatory response. Some adhesions separated with hydrodisection. Dr Excell Seltzer was called in for consult and evaluated dense adhesions. Due to recent infection and dense adhesions bowel adhesions were not separated and peritoneal cavity was washed with saline. Liver appeared normal. Appendix normal. No bowel injury noted.   Following the procedure the umbilical sheath was removed after intra-abdominal carbon dioxide was expressed.  Fascial incision closed with 0-vicry and skin incisions were closed with subcuticular sutures of 4-0 Vicryl. Foley catheter was removed.   Instrument, sponge, and needle counts were correct prior to abdominal closure and at the conclusion of the case.   Estimated Blood Loss:  20cc         Drains: none         Total IV Fluids: 1500 mL LR         Specimens: none            Complications:  None; patient tolerated the procedure well.         Disposition: PACU - hemodynamically stable.         Condition: stable  I performed the surgery. Intra-op consultant Dr Excell Seltzer.  V.Maddeline Roorda, MD

## 2013-09-29 ENCOUNTER — Other Ambulatory Visit (HOSPITAL_COMMUNITY): Payer: Self-pay | Admitting: Obstetrics & Gynecology

## 2013-09-29 DIAGNOSIS — Z1231 Encounter for screening mammogram for malignant neoplasm of breast: Secondary | ICD-10-CM

## 2013-10-18 ENCOUNTER — Other Ambulatory Visit: Payer: Self-pay | Admitting: Obstetrics & Gynecology

## 2013-10-18 DIAGNOSIS — K862 Cyst of pancreas: Secondary | ICD-10-CM

## 2013-10-22 ENCOUNTER — Ambulatory Visit
Admission: RE | Admit: 2013-10-22 | Discharge: 2013-10-22 | Disposition: A | Discharge status: Home / Self Care | Payer: 59 | Source: Ambulatory Visit | Attending: Obstetrics & Gynecology | Admitting: Obstetrics & Gynecology

## 2013-10-22 DIAGNOSIS — K862 Cyst of pancreas: Secondary | ICD-10-CM

## 2013-10-22 MED ORDER — GADOBENATE DIMEGLUMINE 529 MG/ML IV SOLN: 14.0000 mL | Freq: Once | INTRAVENOUS | Status: AC | PRN

## 2013-10-24 ENCOUNTER — Encounter: Payer: 59 | Admitting: Sports Medicine

## 2013-10-25 ENCOUNTER — Ambulatory Visit (HOSPITAL_COMMUNITY)
Admission: RE | Admit: 2013-10-25 | Discharge: 2013-10-25 | Disposition: A | Discharge status: Home / Self Care | Payer: 59 | Source: Ambulatory Visit | Attending: Obstetrics & Gynecology | Admitting: Obstetrics & Gynecology

## 2013-10-25 DIAGNOSIS — Z1231 Encounter for screening mammogram for malignant neoplasm of breast: Secondary | ICD-10-CM | POA: Insufficient documentation

## 2013-10-27 ENCOUNTER — Other Ambulatory Visit: Payer: Self-pay

## 2013-10-27 ENCOUNTER — Telehealth: Payer: Self-pay

## 2013-10-27 DIAGNOSIS — K862 Cyst of pancreas: Secondary | ICD-10-CM

## 2013-10-27 NOTE — Telephone Encounter (Signed)
Message copied by Barron Alvine on Fri Oct 27, 2013 12:02 PM ------      Message from: Milus Banister      Created: Fri Oct 27, 2013 11:47 AM       Sandi,      Happy to help.  I am hosp doc next week and then vacation the week following.  Can get it done Thursday April 16th I'm sure.  I saw in her epic chart she has some connection Juanita Craver (not sure if that is true or an epic error).  If she is seen by [facility] AND she doesn't want to wait until April 16th, then Carol Ada could potentially take care of the EUS instead.              For now, we'll schedule with me, Thursday April 16th unless we hear otherwise            Thanks            Cherree Conerly,      She needs upper EUS, radial +/- linear, Thursday April 16th, ++MAC, for tail of pancreas cyst.                  ----- Message -----         From: Danie Binder, MD         Sent: 10/27/2013  11:15 AM           To: Milus Banister, MD            Dan,            This pt has a pancreatic tail cysts that Dr. Benjie Karvonen discovered. She has a MRI in the system. Dr. Benjie Karvonen will make the referral. I am messaging you because she is a good friend of a good friend of mine, Melburn Popper. She has significant anxiety due to the possibility of carcinoma. Could you work her into your schedule as soon as possible so she/we can move forward with management? Thanks a million/            Sincerely,      Sandi       ------

## 2013-10-27 NOTE — Telephone Encounter (Signed)
EUS scheduled, pt instructed and medications reviewed.  Patient instructions mailed to home.  Patient to call with any questions or concerns.  

## 2013-10-27 NOTE — Telephone Encounter (Signed)
[name],   It would be best to contact Mrs. Cramer at [phone].

## 2013-10-27 NOTE — Telephone Encounter (Signed)
Pt has been scheduled for EUS on 11/16/13 1230 pm instructions mailed to the home Left message on machine to call back

## 2013-10-31 ENCOUNTER — Encounter (HOSPITAL_COMMUNITY): Payer: Self-pay | Admitting: *Deleted

## 2013-10-31 ENCOUNTER — Encounter (HOSPITAL_COMMUNITY): Payer: Self-pay | Admitting: Pharmacy Technician

## 2013-11-16 ENCOUNTER — Encounter (HOSPITAL_COMMUNITY): Payer: Self-pay | Admitting: *Deleted

## 2013-11-16 ENCOUNTER — Encounter (HOSPITAL_COMMUNITY): Payer: 59 | Admitting: Anesthesiology

## 2013-11-16 ENCOUNTER — Ambulatory Visit (HOSPITAL_COMMUNITY)
Admission: RE | Admit: 2013-11-16 | Discharge: 2013-11-16 | Disposition: A | Discharge status: Home / Self Care | Payer: 59 | Source: Ambulatory Visit | Attending: Gastroenterology | Admitting: Gastroenterology

## 2013-11-16 ENCOUNTER — Ambulatory Visit (HOSPITAL_COMMUNITY): Payer: 59 | Admitting: Anesthesiology

## 2013-11-16 ENCOUNTER — Encounter (HOSPITAL_COMMUNITY)
Admission: RE | Disposition: A | Discharge status: Home / Self Care | Payer: Self-pay | Source: Ambulatory Visit | Attending: Gastroenterology

## 2013-11-16 DIAGNOSIS — K862 Cyst of pancreas: Secondary | ICD-10-CM

## 2013-11-16 DIAGNOSIS — K863 Pseudocyst of pancreas: Secondary | ICD-10-CM

## 2013-11-16 DIAGNOSIS — R933 Abnormal findings on diagnostic imaging of other parts of digestive tract: Secondary | ICD-10-CM

## 2013-11-16 DIAGNOSIS — Z9079 Acquired absence of other genital organ(s): Secondary | ICD-10-CM | POA: Insufficient documentation

## 2013-11-16 DIAGNOSIS — Z90710 Acquired absence of both cervix and uterus: Secondary | ICD-10-CM | POA: Insufficient documentation

## 2013-11-16 DIAGNOSIS — K8689 Other specified diseases of pancreas: Secondary | ICD-10-CM | POA: Insufficient documentation

## 2013-11-16 DIAGNOSIS — D649 Anemia, unspecified: Secondary | ICD-10-CM | POA: Insufficient documentation

## 2013-11-16 DIAGNOSIS — Z87891 Personal history of nicotine dependence: Secondary | ICD-10-CM | POA: Insufficient documentation

## 2013-11-16 DIAGNOSIS — J301 Allergic rhinitis due to pollen: Secondary | ICD-10-CM | POA: Insufficient documentation

## 2013-11-16 HISTORY — PX: EUS: SHX5427

## 2013-11-16 SURGERY — UPPER ENDOSCOPIC ULTRASOUND (EUS) LINEAR
Anesthesia: Monitor Anesthesia Care

## 2013-11-16 MED ORDER — MIDAZOLAM HCL 2 MG/2ML IJ SOLN
INTRAMUSCULAR | Status: AC
  Filled 2013-11-16: qty 2

## 2013-11-16 MED ORDER — PROPOFOL 10 MG/ML IV BOLUS
INTRAVENOUS | Status: AC
  Filled 2013-11-16: qty 20

## 2013-11-16 MED ORDER — PROPOFOL INFUSION 10 MG/ML OPTIME
INTRAVENOUS | Status: DC | PRN
  Administered 2013-11-16: 70 ug/kg/min via INTRAVENOUS

## 2013-11-16 MED ORDER — KETAMINE HCL 10 MG/ML IJ SOLN
INTRAMUSCULAR | Status: DC | PRN
  Administered 2013-11-16: 20 mg via INTRAVENOUS

## 2013-11-16 MED ORDER — LACTATED RINGERS IV SOLN
INTRAVENOUS | Status: DC
Start: 2013-11-16 — End: 2013-11-16
  Administered 2013-11-16: 1000 mL via INTRAVENOUS

## 2013-11-16 MED ORDER — MIDAZOLAM HCL 5 MG/5ML IJ SOLN
INTRAMUSCULAR | Status: DC | PRN
  Administered 2013-11-16: 2 mg via INTRAVENOUS

## 2013-11-16 MED ORDER — SODIUM CHLORIDE 0.9 % IV SOLN: INTRAVENOUS | Status: DC

## 2013-11-16 MED ORDER — ONDANSETRON HCL 4 MG/2ML IJ SOLN
INTRAMUSCULAR | Status: DC | PRN
  Administered 2013-11-16: 4 mg via INTRAVENOUS

## 2013-11-16 NOTE — Anesthesia Preprocedure Evaluation (Signed)
Anesthesia Evaluation  Patient identified by MRN, date of birth, ID band Patient awake    Reviewed: Allergy & Precautions, H&P , NPO status , Patient's Chart, lab work & pertinent test results  History of Anesthesia Complications (+) PONV  Airway Mallampati: II TM Distance: >3 FB Neck ROM: Full    Dental  (+) Teeth Intact   Pulmonary former smoker,    Pulmonary exam normal       Cardiovascular negative cardio ROS  Rhythm:Regular Rate:Normal     Neuro/Psych negative neurological ROS  negative psych ROS   GI/Hepatic negative GI ROS, Neg liver ROS,   Endo/Other  negative endocrine ROS  Renal/GU negative Renal ROS  negative genitourinary   Musculoskeletal negative musculoskeletal ROS (+)   Abdominal   Peds  Hematology  (+) anemia ,   Anesthesia Other Findings   Reproductive/Obstetrics negative OB ROS                           Anesthesia Physical  Anesthesia Plan  ASA: II  Anesthesia Plan: MAC   Post-op Pain Management:    Induction:   Airway Management Planned: Natural Airway  Additional Equipment: None  Intra-op Plan:   Post-operative Plan:   Informed Consent: I have reviewed the patients History and Physical, chart, labs and discussed the procedure including the risks, benefits and alternatives for the proposed anesthesia with the patient or authorized representative who has indicated his/her understanding and acceptance.   Dental advisory given  Plan Discussed with: CRNA and Surgeon  Anesthesia Plan Comments:         Anesthesia Quick Evaluation

## 2013-11-16 NOTE — Op Note (Signed)
[facility] [place], [address]   ENDOSCOPIC ULTRASOUND PROCEDURE REPORT  PATIENT: [name], [name].  MR#: [id number] BIRTHDATE: 22-Sep-1955  GENDER: Female ENDOSCOPIST: Milus Banister, MD REFERRED BY:  Wilmon Pali, MD PROCEDURE DATE:  11/16/2013 PROCEDURE:   Upper EUS ASA CLASS:      Class II INDICATIONS:   1.  abnormal tail of pancreas on recent CT scan and MRI; done for post op pelvic abscess problem 2 months ago.  No weight loss, no previous pancreatic disease, no FH of pancreatic disease, drinks on weekends only. MEDICATIONS: MAC sedation, administered by CRNA  DESCRIPTION OF PROCEDURE:   After the risks benefits and alternatives of the procedure were  explained, informed consent was obtained. The patient was then placed in the left, lateral, decubitus postion and IV sedation was administered. Throughout the procedure, the patients blood pressure, pulse and oxygen saturations were monitored continuously.  Under direct visualization, the EUS scope  endoscope was introduced through the mouth  and advanced to the descending duodenum .  Water was used as necessary to provide an acoustic interface.  Upon completion of the imaging, water was removed and the patient was sent to the recovery room in satisfactory condition.  Endoscopic findings (limited views with radial echoendoscope): 1. Normal UGI tract  EUS findings: 1. The main pancreatic duct is completely normal in head, neck, body of the gland however this abruptly changes in extreme tail of pancreas where the main duct becomes dilated (up to 4-18mm), ectatic and there were numerous associated dilated side branches. There is no clear solid mass at the site of this abrupt change. 2. Pancreatic parenchyma is normal in head, neck, body of the gland. In the tail the parenchyma appeas atrophic, somewhat honeycombed. 3. No peripancreatic or celiac adenopathy 4. Normal CBD, non-dilated and  without stones 5. Limited views of liver, spleen, portal and splenic vessels were all normal  Impression: Very abrupt change in main pancreatic duct in the extreme tail of pancreas.  There are no associated solid mass at the site or change or anywhere else in the pancreas on this exam or recent CT/MRI. 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  Given such an abrupt transition however, I recommend she be considered for tail of pancreas resection. 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  My office will arragne referral to Dr. Barry Dienes at [facility].   _______________________________ eSignedMilus Banister, MD 11/16/2013 12:46 PM    cc: Barney Drain, MD; Berneta Sages, MD; Wilmon Pali, MD

## 2013-11-16 NOTE — Transfer of Care (Signed)
Immediate Anesthesia Transfer of Care Note  Patient: [name]  Procedure(s) Performed: Procedure(s): UPPER ENDOSCOPIC ULTRASOUND (EUS) LINEAR (N/A)  Patient Location: PACU  Anesthesia Type:MAC  Level of Consciousness: sedated  Airway & Oxygen Therapy: Patient Spontanous Breathing and Patient connected to nasal cannula oxygen  Post-op Assessment: Report given to PACU RN and Post -op Vital signs reviewed and stable  Post vital signs: Reviewed and stable  Complications: No apparent anesthesia complications

## 2013-11-16 NOTE — Anesthesia Postprocedure Evaluation (Signed)
  Anesthesia Post-op Note  Patient: [name]  Procedure(s) Performed: Procedure(s) (LRB): UPPER ENDOSCOPIC ULTRASOUND (EUS) LINEAR (N/A)  Patient Location: PACU  Anesthesia Type: MAC  Level of Consciousness: awake and alert   Airway and Oxygen Therapy: Patient Spontanous Breathing  Post-op Pain: mild  Post-op Assessment: Post-op Vital signs reviewed, Patient's Cardiovascular Status Stable, Respiratory Function Stable, Patent Airway and No signs of Nausea or vomiting  Last Vitals:  Filed Vitals:   11/16/13 1239  BP: 123/74  Pulse: 66  Temp: 36.8 C  Resp: 16    Post-op Vital Signs: stable   Complications: No apparent anesthesia complications

## 2013-11-16 NOTE — Discharge Instructions (Signed)

## 2013-11-16 NOTE — H&P (Signed)
  HPI: This is a woman found to have abnromal tail of pancreas on recent imaging (done for post op pelvic abscess)    Past Medical History  Diagnosis Date  . SVD (spontaneous vaginal delivery)     x 1  . Seasonal allergies   . Fibroids 09/07/2013  . Anemia     on iron  . PONV (postoperative nausea and vomiting)     non recent     Past Surgical History  Procedure Laterality Date  . Colonoscopy      2 or 3  . Dilation and curettage of uterus      x 3  . Wisdom tooth extraction    . Eye surgery      lasik bilateral  . Robotic assisted total hysterectomy with bilateral salpingo oopherectomy Bilateral 09/07/2013    Procedure: ROBOTIC ASSISTED TOTAL HYSTERECTOMY WITH BILATERAL SALPINGO OOPHORECTOMY;  Surgeon: Elveria Royals, MD;  Location: [facility];  Service: Gynecology;  Laterality: Bilateral;  . Laparoscopy N/A 09/25/2013    Procedure: LAPAROSCOPY OPERATIVE;  Surgeon: Elveria Royals, MD;  Location: [facility];  Service: Gynecology;  Laterality: N/A;    Current Facility-Administered Medications  Medication Dose Route Frequency Provider Last Rate Last Dose  . 0.9 %  sodium chloride infusion   Intravenous Continuous Milus Banister, MD      . lactated ringers infusion   Intravenous Continuous Milus Banister, MD 125 mL/hr at 11/16/13 1147 1,000 mL at 11/16/13 1147    Allergies as of 10/27/2013  . (No Known Allergies)    History reviewed. No pertinent family history.  History   Social History  . Marital Status: Married    Spouse Name: N/A    Number of Children: N/A  . Years of Education: N/A   Occupational History  . Not on file.   Social History Main Topics  . Smoking status: Former Smoker -- 1.00 packs/day for 10 years    Types: Cigarettes    Quit date: 01/02/1987  . Smokeless tobacco: Never Used  . Alcohol Use: Yes     Comment: socially  . Drug Use: No  . Sexual Activity: Not Currently    Birth Control/ Protection: Post-menopausal, Surgical   Other Topics Concern   . Not on file   Social History Narrative  . 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

## 2013-11-17 ENCOUNTER — Encounter (HOSPITAL_COMMUNITY): Payer: Self-pay | Admitting: Gastroenterology

## 2013-11-17 ENCOUNTER — Telehealth: Payer: Self-pay

## 2013-11-17 DIAGNOSIS — R933 Abnormal findings on diagnostic imaging of other parts of digestive tract: Secondary | ICD-10-CM

## 2013-11-17 NOTE — Telephone Encounter (Signed)
[facility] to notify pt  

## 2013-11-17 NOTE — Telephone Encounter (Signed)
Message copied by Barron Alvine on Fri Nov 17, 2013  8:22 AM ------      Message from: Owens Loffler P      Created: Thu Nov 16, 2013 12:48 PM       Just completed EUS, see below.                  Impression:      Very abrupt change in main pancreatic duct in the extreme tail of pancreas.  There are no associated solid mass at the site or change or anywhere else in the pancreas on this exam or recent CT/MRI.  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  Given such an abrupt transition however, I recommend she be considered for tail of pancreas resection. 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  My office will arragne referral to Dr. Barry Dienes at [facility].                  Dorris Fetch,       I am having Dakiya Puopolo set up referral.            Palmira Stickle,      Can you arrange referral to Dr. Barry Dienes to consider tail of pancreas resection.  Thanks       ------

## 2013-11-17 NOTE — Telephone Encounter (Signed)
Message copied by Barron Alvine on Fri Nov 17, 2013 11:52 AM ------      Message from: Aviva Signs      Created: Fri Nov 17, 2013 11:47 AM       Pt is scheduled for April 27 arrive at 2.40 for a 3.00pm with Dr Barry Dienes.Marland KitchenMarland KitchenThayer Headings      ----- Message -----         From: Barron Alvine, CMA         Sent: 11/17/2013   8:22 AM           To: Aviva Signs            Dr. Barry Dienes to consider tail of pancreas resection       ------

## 2013-11-17 NOTE — Telephone Encounter (Signed)
Referral made 

## 2013-11-27 ENCOUNTER — Ambulatory Visit (INDEPENDENT_AMBULATORY_CARE_PROVIDER_SITE_OTHER): Payer: Commercial Managed Care - PPO | Admitting: General Surgery

## 2013-11-27 ENCOUNTER — Encounter (INDEPENDENT_AMBULATORY_CARE_PROVIDER_SITE_OTHER): Payer: Self-pay | Admitting: General Surgery

## 2013-11-27 VITALS — BP 116/80 | HR 80 | Temp 97.4°F | Resp 14 | Ht 65.0 in | Wt 163.0 lb

## 2013-11-27 DIAGNOSIS — D490 Neoplasm of unspecified behavior of digestive system: Secondary | ICD-10-CM

## 2013-11-27 NOTE — Assessment & Plan Note (Signed)
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    There is no mass visible, but dilation of main duct is abnormal.    Recommend resection.  Would have a goal of leaving spleen in place.  Would send off intraoperatively to assess if cancer present.  Pt desires to get this done soon.    I do not think there would be harm in some delay, but main duct IMPNs do have higher risk of cancer and this would likely not alter our recommendation.  She is also going to seek a second opinion at [facility].  I have emailed Pilar Plate at [facility] to see if she can see her.    We reviewed the risks of pancreatic leak, bleeding, infection, damage to adjacent structures, blood clot, cardiac or pulmonary complications, prolonged fatigue, diabetes, and exocrine pancreatic insufficiency.

## 2013-11-27 NOTE — Patient Instructions (Signed)
Pancreas Surgery  THE OPERATION: The goal of the operation is to remove masses in the tail of the pancreas.  The distal pancreas and (possibly the spleen) will be removed.  The reason so much needs to be removed is that if there is cancer, the blood supply and lymphatic channels and nodes are closely interconnected.  The operation takes between 2-4 hours depending on the amount of scar tissue you have present from prior surgeries, or from the mass.    WHAT HAPPENS IN THE OPERATING ROOM:  You will have an incision in the midline around 1-2 inches, and several other incisions in the left upper abdomen.  Whatever the operative findings, I will discuss the case with your family after we are done in the operating room.  I will talk to you in the next few days when you are more awake.  I will see you in the hospital every weekday that I am not out of town.  My partners help see patients on the weekends and if I am out of town.    WHAT HAPPENS AFTER SURGERY: After surgery, you will go first to the recovery room,then to an appropriate level of room.  You will have many tubes and lines in place which is standard for this operation.   You may have a tube in your nose for 1-2 days in order to suction out the stomach.  You will have a catheter in your bladder.  On your abdomen, you will have a surgical drains and possibly a pain pump with numbing medicine.  You will have compression stockings on your legs to decrease the risk of blood clots.    We will address your pain in several ways.  We will use an IV pain pump called a "PCA," or Patient Controlled Analgesia.  This allows you to press a button and immediately receive a dose of pain medication without waiting for a nurse.  We also use IV Tylenol and sometimes IV Toradol which is similar to ibuprofen.  You may also have a pump with numbing medicine delivered directly to your incision.  I use doses and medications that work for the majority of people, but you may  need an adjustment to the dose or type of medicine if your pain is not adequately controlled.  Your throat may be sore, in which case you may need a throat spray or lozenges.    We will ask you to get out of bed the day after surgery in order to maximize your chances of not having complications.  Your risk of pneumonia and blood clots is lower with walking and sitting in the chair.  We will also ask you to perform breathing exercises.  We will also ask to you walk in your room and in the halls for the above reasons, but also in order for you to keep up your strength.    EATING: We will usually start you on clear liquids in around 1-2 days if your bowel function seems to have returned.  We advance your diet slowly to make sure you are tolerating each step.  All patients do not have a normal appetite when they go home Most patients also find that their taste buds do not seem the same right after surgery, and this can continue into the time of possible post operative chemotherapy and radiation.  Some patients develop diabetes and will need assistance from a primary care doctor for medication.    OTHER TREATMENT? I will present your  case when the pathology is available at our GI Multidisciplinary conference which occurs every 1-2 weeks.  Medical and Radiation Oncologists are present, as well as the pathologist and radiologist in addition to myself.  If you have a larger tumor or if you have positive lymph nodes, we may have the oncologist stop by to see you while you are in the hospital.  Most firm post op treatment plans occur, however, once you are an outpatient because we will need to see how you are recovering from surgery.  GOING HOME! Usually you are able to go home in 3-7 days, depending on whether or not complications happen and what is going on with your overall health status.   If you have more health problems or if you have limited help at home, the therapists and nurses may recommend a temporary  rehab or nursing facility to help you get back on your feet before you go home.  These decisions would be made while you are in the hospital with the assistance of a social worker or case manager.    Please bring all insurance/disability forms to our office for the staff to fill out   POSSIBLE COMPLICATIONS This is a very extensive operation and includes complications listed below: Bleeding Infection and possible wound complications such as hernia Damage to adjacent structures Leak of the tail of the pancreas. Possible need for other procedures, such as abscess drains in radiology or endoscopy.   Possible prolonged hospital stay Possible development of diabetes or worsening of current diabetes. (5-10%) Possible diarrhea from lack of pancreatic enzymes.   Prolonged fatigue/weakness/appetite MOST PATIENTS' ENERGY LEVEL IS NOT BACK TO NORMAL FOR AT LEAST 2-3 MONTHS.  OLDER PATIENTS MAY FEEL WEAK FOR LONGER PERIODS OF TIME.   Possible early recurrence of cancer Possible complications of your medical problems such as heart disease or arrhythmias. Death (less than 1%)  All possible complications are not listed, just the most common.    FURTHER INFORMATION? Please ask questions if you find something that we did not discuss in the office and would like more information.  If you would like another appointment if you have many questions or if your family members would like to come as well, please contact the office.    IF YOU ARE TAKING ASPIRIN, PLAVIX, COUMADIN, OR OTHER BLOOD THINNERS, LET us KNOW IMMEDIATELY SO WE CAN CONTACT YOUR PRESCRIBING HEALTH CARE PROVIDER TO HOLD THE MEDICATION FOR 5-7 DAYS BEFORE SURGERY

## 2013-11-27 NOTE — Progress Notes (Signed)
Chief Complaint  Patient presents with  . New Evaluation    eval tail pancreas resection    HISTORY: Pt is a 58 yo F who had an incidental finding in the pancreas when she had a pelvic abscess following a lap assisted vaginal hysterectomy.  Her CT scan showed an abrupt cutoff of the pancreatic duct size with distal atrophy.  MR and EUS confirmed this.  She had ectatic main duct and ectatic side ducts of the distal pancreas.  She is not having weight loss.  She has never had pancreatitis of which she is aware.  She has never been diabetic or told she has any issues with her glucose metabolism.  She has a family history of colon cancer in her mother, but no history of pancreatic cancer.    Past Medical History  Diagnosis Date  . SVD (spontaneous vaginal delivery)     x 1  . Seasonal allergies   . Fibroids 09/07/2013  . Anemia     on iron  . PONV (postoperative nausea and vomiting)     non recent     Past Surgical History  Procedure Laterality Date  . Colonoscopy      2 or 3  . Dilation and curettage of uterus      x 3  . Wisdom tooth extraction    . Eye surgery      lasik bilateral  . Robotic assisted total hysterectomy with bilateral salpingo oopherectomy Bilateral 09/07/2013    Procedure: ROBOTIC ASSISTED TOTAL HYSTERECTOMY WITH BILATERAL SALPINGO OOPHORECTOMY;  Surgeon: Elveria Royals, MD;  Location: [facility];  Service: Gynecology;  Laterality: Bilateral;  . Laparoscopy N/A 09/25/2013    Procedure: LAPAROSCOPY OPERATIVE;  Surgeon: Elveria Royals, MD;  Location: [facility];  Service: Gynecology;  Laterality: N/A;  . Eus N/A 11/16/2013    Procedure: UPPER ENDOSCOPIC ULTRASOUND (EUS) LINEAR;  Surgeon: Milus Banister, MD;  Location: [facility];  Service: Endoscopy;  Laterality: N/A;    Current Outpatient Prescriptions  Medication Sig Dispense Refill  . ascorbic acid (VITAMIN C) 1000 MG tablet Take 1,000 mg by mouth daily.      Marland Kitchen b complex vitamins tablet Take 1 tablet by mouth daily.       . Calcium Carb-Cholecalciferol (CALCIUM 600 + D PO) Take 1 tablet by mouth daily.      . carboxymethylcellulose (REFRESH PLUS) 0.5 % SOLN Place 1 drop into both eyes as needed (Dry eyes).      Marland Kitchen estradiol (VIVELLE-DOT) 0.05 MG/24HR patch Place 2 patches (0.1 mg total) onto the skin 2 (two) times a week. Wed & Sat  8 patch  12  . estradiol (VIVELLE-DOT) 0.1 MG/24HR patch Place 1 patch onto the skin 2 (two) times a week. Wednesday and Saturday. Takes with the 0.8m patch      . ferrous sulfate 325 (65 FE) MG tablet Take 325 mg by mouth daily with breakfast.      . fexofenadine-pseudoephedrine (ALLEGRA-D) 60-120 MG per tablet Take 1 tablet by mouth daily.      . fluticasone (FLONASE) 50 MCG/ACT nasal spray Place 2 sprays into both nostrils daily.      .Marland Kitchenibuprofen (ADVIL) 200 MG tablet Take 3 tablets (600 mg total) by mouth every 6 (six) hours as needed.  30 tablet  0  . Lactobacillus (ACIDOPHILUS PO) Take 1 tablet by mouth daily.      . Magnesium 250 MG TABS Take 250 mg by mouth daily.      .Marland Kitchen  meclizine (DRAMAMINE II) 25 MG tablet Take 25 mg by mouth at bedtime.      . montelukast (SINGULAIR) 10 MG tablet Take 10 mg by mouth at bedtime.      . Multiple Vitamin (MULTIVITAMIN) tablet Take 1 tablet by mouth daily.      . Omega-3 Fatty Acids (FISH OIL) 1000 MG CAPS Take 1 capsule by mouth daily.       No current facility-administered medications for this visit.     No Known Allergies   Family History  Problem Relation Age of Onset  . Cancer Mother 29    colon,lung   . COPD Mother      History   Social History  . Marital Status: Married    Spouse Name: N/A    Number of Children: N/A  . Years of Education: N/A   Social History Main Topics  . Smoking status: Former Smoker -- 1.00 packs/day for 10 years    Types: Cigarettes    Quit date: 01/02/1987  . Smokeless tobacco: Never Used  . Alcohol Use: Yes     Comment: socially  . Drug Use: No  . Sexual Activity: Not Currently     Birth Control/ Protection: Post-menopausal, Surgical   Other Topics Concern  . None   Social History Narrative  . None     REVIEW OF SYSTEMS - PERTINENT POSITIVES ONLY: 12 point review of systems negative other than HPI and PMH  EXAM: Filed Vitals:   11/27/13 1111  BP: 116/80  Pulse: 80  Temp: 97.4 F (36.3 C)  Resp: 14    Wt Readings from Last 3 Encounters:  11/27/13 163 lb (73.936 kg)  11/16/13 160 lb (72.576 kg)  11/16/13 160 lb (72.576 kg)     Gen:  No acute distress.  Well nourished and well groomed.   Neurological: Alert and oriented to person, place, and time. Coordination normal.  Head: Normocephalic and atraumatic.  Eyes: Conjunctivae are normal. Pupils are equal, round, and reactive to light. No scleral icterus.  Neck: Normal range of motion. Neck supple. No tracheal deviation or thyromegaly present.  Cardiovascular: Normal rate, regular rhythm.  Intact distal pulses. Respiratory: Effort normal.  No respiratory distress. No chest wall tenderness. Breath sounds normal.  No wheezes, rales or rhonchi.  GI: Soft. Bowel sounds are normal. The abdomen is soft and nontender.  There is no rebound and no guarding.  Musculoskeletal: Normal range of motion. Extremities are nontender.  Lymphadenopathy: No cervical, preauricular, postauricular or axillary adenopathy is present Skin: Skin is warm and dry. No rash noted. No diaphoresis. No erythema. No pallor. No clubbing, cyanosis, or edema.   Psychiatric: Normal mood and affect. Behavior is normal. Judgment and thought content normal.    LABORATORY RESULTS: Available labs are reviewed   Recent Results (from the past 2160 hour(s))  CBC     Status: None   Collection Time    09/04/13  3:46 PM      Result Value Ref Range   WBC 5.9  4.0 - 10.5 K/uL   RBC 4.10  3.87 - 5.11 MIL/uL   Hemoglobin 12.5  12.0 - 15.0 g/dL   HCT 37.4  36.0 - 46.0 %   MCV 91.2  78.0 - 100.0 fL   MCH 30.5  26.0 - 34.0 pg   MCHC 33.4  30.0 -  36.0 g/dL   RDW 13.0  11.5 - 15.5 %   Platelets 304  150 - 400 K/uL  COMPREHENSIVE METABOLIC PANEL  Status: Abnormal   Collection Time    09/04/13  3:46 PM      Result Value Ref Range   Sodium 140  137 - 147 mEq/L   Potassium 3.9  3.7 - 5.3 mEq/L   Chloride 101  96 - 112 mEq/L   CO2 30  19 - 32 mEq/L   Glucose, Bld 138 (*) 70 - 99 mg/dL   BUN 16  6 - 23 mg/dL   Creatinine, Ser 1.01  0.50 - 1.10 mg/dL   Calcium 9.4  8.4 - 10.5 mg/dL   Total Protein 7.1  6.0 - 8.3 g/dL   Albumin 3.9  3.5 - 5.2 g/dL   AST 17  0 - 37 U/L   ALT 17  0 - 35 U/L   Alkaline Phosphatase 52  39 - 117 U/L   Total Bilirubin <0.2 (*) 0.3 - 1.2 mg/dL   Comment: REPEATED TO VERIFY   GFR calc non Af Amer 61 (*) >90 mL/min   GFR calc Af Amer 71 (*) >90 mL/min   Comment: (NOTE)     The eGFR has been calculated using the CKD EPI equation.     This calculation has not been validated in all clinical situations.     eGFR's persistently <90 mL/min signify possible Chronic Kidney     Disease.  CBC     Status: Abnormal   Collection Time    09/08/13  5:45 AM      Result Value Ref Range   WBC 8.2  4.0 - 10.5 K/uL   RBC 3.44 (*) 3.87 - 5.11 MIL/uL   Hemoglobin 10.4 (*) 12.0 - 15.0 g/dL   HCT 31.5 (*) 36.0 - 46.0 %   MCV 91.6  78.0 - 100.0 fL   MCH 30.2  26.0 - 34.0 pg   MCHC 33.0  30.0 - 36.0 g/dL   RDW 12.9  11.5 - 15.5 %   Platelets 242  150 - 400 K/uL  CBC WITH DIFFERENTIAL     Status: Abnormal   Collection Time    09/18/13  4:31 AM      Result Value Ref Range   WBC 13.5 (*) 4.0 - 10.5 K/uL   RBC 3.92  3.87 - 5.11 MIL/uL   Hemoglobin 12.0  12.0 - 15.0 g/dL   HCT 35.1 (*) 36.0 - 46.0 %   MCV 89.5  78.0 - 100.0 fL   MCH 30.6  26.0 - 34.0 pg   MCHC 34.2  30.0 - 36.0 g/dL   RDW 12.5  11.5 - 15.5 %   Platelets 366  150 - 400 K/uL   Neutrophils Relative % 78 (*) 43 - 77 %   Neutro Abs 10.6 (*) 1.7 - 7.7 K/uL   Lymphocytes Relative 10 (*) 12 - 46 %   Lymphs Abs 1.4  0.7 - 4.0 K/uL   Monocytes Relative 10   3 - 12 %   Monocytes Absolute 1.3 (*) 0.1 - 1.0 K/uL   Eosinophils Relative 2  0 - 5 %   Eosinophils Absolute 0.3  0.0 - 0.7 K/uL   Basophils Relative 0  0 - 1 %   Basophils Absolute 0.0  0.0 - 0.1 K/uL  COMPREHENSIVE METABOLIC PANEL     Status: Abnormal   Collection Time    09/18/13  4:31 AM      Result Value Ref Range   Sodium 139  137 - 147 mEq/L   Potassium 4.5  3.7 - 5.3 mEq/L  Chloride 101  96 - 112 mEq/L   CO2 24  19 - 32 mEq/L   Glucose, Bld 123 (*) 70 - 99 mg/dL   BUN 12  6 - 23 mg/dL   Creatinine, Ser 0.71  0.50 - 1.10 mg/dL   Calcium 9.4  8.4 - 10.5 mg/dL   Total Protein 7.4  6.0 - 8.3 g/dL   Albumin 3.5  3.5 - 5.2 g/dL   AST 27  0 - 37 U/L   ALT 45 (*) 0 - 35 U/L   Alkaline Phosphatase 64  39 - 117 U/L   Total Bilirubin 0.4  0.3 - 1.2 mg/dL   GFR calc non Af Amer >90  >90 mL/min   GFR calc Af Amer >90  >90 mL/min   Comment: (NOTE)     The eGFR has been calculated using the CKD EPI equation.     This calculation has not been validated in all clinical situations.     eGFR's persistently <90 mL/min signify possible Chronic Kidney     Disease.  CBC WITH DIFFERENTIAL     Status: Abnormal   Collection Time    09/19/13  5:37 AM      Result Value Ref Range   WBC 13.4 (*) 4.0 - 10.5 K/uL   RBC 3.32 (*) 3.87 - 5.11 MIL/uL   Hemoglobin 10.0 (*) 12.0 - 15.0 g/dL   HCT 30.1 (*) 36.0 - 46.0 %   MCV 90.7  78.0 - 100.0 fL   MCH 30.1  26.0 - 34.0 pg   MCHC 33.2  30.0 - 36.0 g/dL   RDW 12.6  11.5 - 15.5 %   Platelets 328  150 - 400 K/uL   Neutrophils Relative % 73  43 - 77 %   Neutro Abs 9.7 (*) 1.7 - 7.7 K/uL   Lymphocytes Relative 13  12 - 46 %   Lymphs Abs 1.8  0.7 - 4.0 K/uL   Monocytes Relative 12  3 - 12 %   Monocytes Absolute 1.6 (*) 0.1 - 1.0 K/uL   Eosinophils Relative 2  0 - 5 %   Eosinophils Absolute 0.3  0.0 - 0.7 K/uL   Basophils Relative 0  0 - 1 %   Basophils Absolute 0.1  0.0 - 0.1 K/uL  CBC WITH DIFFERENTIAL     Status: Abnormal   Collection Time     09/20/13  5:02 AM      Result Value Ref Range   WBC 13.2 (*) 4.0 - 10.5 K/uL   RBC 3.32 (*) 3.87 - 5.11 MIL/uL   Hemoglobin 9.9 (*) 12.0 - 15.0 g/dL   HCT 29.9 (*) 36.0 - 46.0 %   MCV 90.1  78.0 - 100.0 fL   MCH 29.8  26.0 - 34.0 pg   MCHC 33.1  30.0 - 36.0 g/dL   RDW 12.4  11.5 - 15.5 %   Platelets 321  150 - 400 K/uL   Neutrophils Relative % 74  43 - 77 %   Neutro Abs 9.8 (*) 1.7 - 7.7 K/uL   Lymphocytes Relative 11 (*) 12 - 46 %   Lymphs Abs 1.5  0.7 - 4.0 K/uL   Monocytes Relative 11  3 - 12 %   Monocytes Absolute 1.4 (*) 0.1 - 1.0 K/uL   Eosinophils Relative 4  0 - 5 %   Eosinophils Absolute 0.5  0.0 - 0.7 K/uL   Basophils Relative 1  0 - 1 %   Basophils Absolute  0.1  0.0 - 0.1 K/uL  CBC     Status: Abnormal   Collection Time    09/25/13  3:58 PM      Result Value Ref Range   WBC 12.9 (*) 4.0 - 10.5 K/uL   RBC 3.83 (*) 3.87 - 5.11 MIL/uL   Hemoglobin 11.5 (*) 12.0 - 15.0 g/dL   HCT 34.2 (*) 36.0 - 46.0 %   MCV 89.3  78.0 - 100.0 fL   MCH 30.0  26.0 - 34.0 pg   MCHC 33.6  30.0 - 36.0 g/dL   RDW 12.6  11.5 - 15.5 %   Platelets 500 (*) 150 - 400 K/uL  BASIC METABOLIC PANEL     Status: Abnormal   Collection Time    09/25/13  3:58 PM      Result Value Ref Range   Sodium 135 (*) 137 - 147 mEq/L   Potassium 3.6 (*) 3.7 - 5.3 mEq/L   Chloride 96  96 - 112 mEq/L   CO2 25  19 - 32 mEq/L   Glucose, Bld 99  70 - 99 mg/dL   BUN 10  6 - 23 mg/dL   Creatinine, Ser 0.85  0.50 - 1.10 mg/dL   Calcium 9.4  8.4 - 10.5 mg/dL   GFR calc non Af Amer 75 (*) >90 mL/min   GFR calc Af Amer 87 (*) >90 mL/min   Comment: (NOTE)     The eGFR has been calculated using the CKD EPI equation.     This calculation has not been validated in all clinical situations.     eGFR's persistently <90 mL/min signify possible Chronic Kidney     Disease.  DIFFERENTIAL     Status: Abnormal   Collection Time    09/25/13  3:58 PM      Result Value Ref Range   Neutrophils Relative % 74  43 - 77 %    Neutro Abs 8.9 (*) 1.7 - 7.7 K/uL   Lymphocytes Relative 19  12 - 46 %   Lymphs Abs 2.3  0.7 - 4.0 K/uL   Monocytes Relative 4  3 - 12 %   Monocytes Absolute 0.5  0.1 - 1.0 K/uL   Eosinophils Relative 3  0 - 5 %   Eosinophils Absolute 0.3  0.0 - 0.7 K/uL   Basophils Relative 0  0 - 1 %   Basophils Absolute 0.0  0.0 - 0.1 K/uL     RADIOLOGY RESULTS:  CT abd/pelvis  IMPRESSION: Pancreatic tail ductal dilatation and atrophy with abrupt transition to normal duct at the tail/ body junction. 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 A non border deforming early/subtle carcinoma could look similar. If the patient is a good endoscopic ultrasound candidate, this should be considered. If the patient is a poor endoscopic ultrasound candidate, and/or there has a known history of prior pancreatitis, follow-up with pre and post contrast MRI at 6-8 weeks is an alternate clinical strategy.  MRI abd IMPRESSION: 1. Multiloculated pelvic fluid collections, likely abscess, with extent described above. The largest pocket is along the right pelvic sidewall, 7 x 4 x 4 cm. 2. Reactive colitis of the sigmoid and rectum. 3. Ductal dilatation and parenchymal atrophy of the pancreatic tail. Follow-up pancreas MRI recommended to exclude obstructing mass. Non-emergent MRI should be deferred until patient has been discharged for the acute illness, and can optimally cooperate with positioning and breath-holding instructions.  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    There is no mass visible, but dilation of main duct is abnormal.    Recommend resection.  Would have a goal of leaving spleen in place.  Would send off intraoperatively to assess if cancer present.  Pt desires to get this  done soon.    I do not think there would be harm in some delay, but main duct IMPNs do have higher risk of cancer and this would likely not alter our recommendation.  She is also going to seek a second opinion at [facility].  I have emailed Pilar Plate at [facility] to see if she can see her.    We reviewed the risks of pancreatic leak, bleeding, infection, damage to adjacent structures, blood clot, cardiac or pulmonary complications, prolonged fatigue, diabetes, and exocrine pancreatic insufficiency.         Milus Height MD Surgical Oncology, General [facility]      Visit Diagnoses: 1. IPMN (intraductal papillary mucinous neoplasm)     Primary Care Physician: Tivis Ringer, MD  Oretha Caprice MD Gastroenterology

## 2013-11-30 ENCOUNTER — Encounter (HOSPITAL_COMMUNITY): Payer: Self-pay | Admitting: Pharmacy Technician

## 2013-11-30 ENCOUNTER — Other Ambulatory Visit (HOSPITAL_COMMUNITY): Payer: Self-pay | Admitting: *Deleted

## 2013-11-30 NOTE — Pre-Procedure Instructions (Signed)
[name]  11/30/2013   Your procedure is scheduled on:  Thursday, Dec 07, 2013 at 1:30 PM.   Report to [facility] Entrance "A" Admitting Office at 11:30 AM.   Call this number if you have problems the morning of surgery: [phone]   Remember:   Do not eat food or drink liquids after midnight Wednesday, 12/16/13.   Take these medicines the morning of surgery with A SIP OF WATER: meclizine (DRAMAMINE II), fluticasone (FLONASE),  ALPRAZolam [name]) - if needed.   Stop all Vitamins and Allegra D as of today.    Do not wear jewelry, make-up or nail polish.  Do not wear lotions, powders, or perfumes. You may wear deodorant.  Do not shave 48 hours prior to surgery.   Do not bring valuables to the hospital.  [name] is not responsible                  for any belongings or valuables.               Contacts, dentures or bridgework may not be worn into surgery.  Leave suitcase in the car. After surgery it may be brought to your room.  For patients admitted to the hospital, discharge time is determined by your                treatment team.               Special Instructions: [name] - Preparing for Surgery  Before surgery, you can play an important role.  Because skin is not sterile, your skin needs to be as free of germs as possible.  You can reduce the number of germs on you skin by washing with CHG (chlorahexidine gluconate) soap before surgery.  CHG is an antiseptic cleaner which kills germs and bonds with the skin to continue killing germs even after washing.  Please DO NOT use if you have an allergy to CHG or antibacterial soaps.  If your skin becomes reddened/irritated stop using the CHG and inform your nurse when you arrive at Short Stay.  Do not shave (including legs and underarms) for at least 48 hours prior to the first CHG shower.  You may shave your face.  Please follow these instructions carefully:   1.  Shower with CHG Soap the night before surgery  and the                                morning of Surgery.  2.  If you choose to wash your hair, wash your hair first as usual with your       normal shampoo.  3.  After you shampoo, rinse your hair and body thoroughly to remove the                      Shampoo.  4.  Use CHG as you would any other liquid soap.  You can apply chg directly       to the skin and wash gently with scrungie or a clean washcloth.  5.  Apply the CHG Soap to your body ONLY FROM THE NECK DOWN.        Do not use on open wounds or open sores.  Avoid contact with your eyes, ears, mouth and genitals (private parts).  Wash genitals (private parts) with your normal soap.  6.  Wash thoroughly, paying special  attention to the area where your surgery        will be performed.  7.  Thoroughly rinse your body with warm water from the neck down.  8.  DO NOT shower/wash with your normal soap after using and rinsing off       the CHG Soap.  9.  Pat yourself dry with a clean towel.            10.  Wear clean pajamas.            11.  Place clean sheets on your bed the night of your first shower and do not        sleep with pets.  Day of Surgery  Do not apply any lotions the morning of surgery.  Please wear clean clothes to the hospital/surgery center.     Please read over the following fact sheets that you were given: Pain Booklet, Coughing and Deep Breathing, Blood Transfusion Information and Surgical Site Infection Prevention

## 2013-12-01 ENCOUNTER — Encounter (HOSPITAL_COMMUNITY)
Admission: RE | Admit: 2013-12-01 | Discharge: 2013-12-01 | Disposition: A | Discharge status: Home / Self Care | Payer: 59 | Source: Ambulatory Visit | Attending: General Surgery | Admitting: General Surgery

## 2013-12-01 ENCOUNTER — Encounter (HOSPITAL_COMMUNITY): Payer: Self-pay

## 2013-12-01 DIAGNOSIS — Z01812 Encounter for preprocedural laboratory examination: Secondary | ICD-10-CM | POA: Insufficient documentation

## 2013-12-01 HISTORY — DX: Malignant (primary) neoplasm, unspecified: C80.1

## 2013-12-01 LAB — URINALYSIS, ROUTINE W REFLEX MICROSCOPIC
Bilirubin Urine: NEGATIVE
Glucose, UA: NEGATIVE mg/dL
Hgb urine dipstick: NEGATIVE
Ketones, ur: NEGATIVE mg/dL
Leukocytes, UA: NEGATIVE
Nitrite: NEGATIVE
Protein, ur: NEGATIVE mg/dL
Specific Gravity, Urine: 1.008 (ref 1.005–1.030)
Urobilinogen, UA: 0.2 mg/dL (ref 0.0–1.0)
pH: 7 (ref 5.0–8.0)

## 2013-12-01 LAB — TYPE AND SCREEN
ABO/RH(D): O POS
Antibody Screen: NEGATIVE

## 2013-12-01 LAB — CBC
HCT: 40.2 % (ref 36.0–46.0)
Hemoglobin: 13.6 g/dL (ref 12.0–15.0)
MCH: 29.8 pg (ref 26.0–34.0)
MCHC: 33.8 g/dL (ref 30.0–36.0)
MCV: 88 fL (ref 78.0–100.0)
Platelets: 290 10*3/uL (ref 150–400)
RBC: 4.57 MIL/uL (ref 3.87–5.11)
RDW: 13 % (ref 11.5–15.5)
WBC: 5.5 10*3/uL (ref 4.0–10.5)

## 2013-12-01 LAB — PROTIME-INR
INR: 0.99 (ref 0.00–1.49)
Prothrombin Time: 12.9 seconds (ref 11.6–15.2)

## 2013-12-01 LAB — COMPREHENSIVE METABOLIC PANEL
ALT: 18 U/L (ref 0–35)
AST: 21 U/L (ref 0–37)
Albumin: 4.2 g/dL (ref 3.5–5.2)
Alkaline Phosphatase: 55 U/L (ref 39–117)
BUN: 12 mg/dL (ref 6–23)
CO2: 24 mEq/L (ref 19–32)
Calcium: 9.5 mg/dL (ref 8.4–10.5)
Chloride: 101 mEq/L (ref 96–112)
Creatinine, Ser: 0.83 mg/dL (ref 0.50–1.10)
GFR calc Af Amer: 90 mL/min — ABNORMAL LOW (ref >90)
GFR calc non Af Amer: 77 mL/min — ABNORMAL LOW (ref >90)
Glucose, Bld: 64 mg/dL — ABNORMAL LOW (ref 70–99)
Potassium: 4.2 mEq/L (ref 3.7–5.3)
Sodium: 140 mEq/L (ref 137–147)
Total Bilirubin: 0.4 mg/dL (ref 0.3–1.2)
Total Protein: 7.5 g/dL (ref 6.0–8.3)

## 2013-12-01 LAB — ABO/RH: ABO/RH(D): O POS

## 2013-12-01 LAB — APTT: aPTT: 30 seconds (ref 24–37)

## 2013-12-01 NOTE — Progress Notes (Signed)
Primary physician - dr. Dagmar Hait Had ekg last may but has every other year. No other cardiac testing

## 2013-12-06 MED ORDER — CEFAZOLIN SODIUM-DEXTROSE 2-3 GM-% IV SOLR
2.0000 g | INTRAVENOUS | Status: AC
  Administered 2013-12-07: 2 g via INTRAVENOUS
  Filled 2013-12-06: qty 50

## 2013-12-07 ENCOUNTER — Inpatient Hospital Stay (HOSPITAL_COMMUNITY)
Admission: RE | Admit: 2013-12-07 | Discharge: 2013-12-13 | DRG: 983 | Disposition: A | Discharge status: Home / Self Care | Payer: 59 | Source: Ambulatory Visit | Attending: General Surgery | Admitting: General Surgery

## 2013-12-07 ENCOUNTER — Inpatient Hospital Stay (HOSPITAL_COMMUNITY): Payer: 59 | Admitting: Certified Registered Nurse Anesthetist

## 2013-12-07 ENCOUNTER — Encounter (HOSPITAL_COMMUNITY): Payer: 59 | Admitting: Certified Registered Nurse Anesthetist

## 2013-12-07 ENCOUNTER — Encounter (HOSPITAL_COMMUNITY)
Admission: RE | Disposition: A | Discharge status: Home / Self Care | Payer: Self-pay | Source: Ambulatory Visit | Attending: General Surgery

## 2013-12-07 ENCOUNTER — Encounter (HOSPITAL_COMMUNITY): Payer: Self-pay | Admitting: *Deleted

## 2013-12-07 DIAGNOSIS — Z836 Family history of other diseases of the respiratory system: Secondary | ICD-10-CM

## 2013-12-07 DIAGNOSIS — D019 Carcinoma in situ of digestive organ, unspecified: Principal | ICD-10-CM | POA: Diagnosis present

## 2013-12-07 DIAGNOSIS — Z780 Asymptomatic menopausal state: Secondary | ICD-10-CM

## 2013-12-07 DIAGNOSIS — D490 Neoplasm of unspecified behavior of digestive system: Secondary | ICD-10-CM

## 2013-12-07 DIAGNOSIS — Z801 Family history of malignant neoplasm of trachea, bronchus and lung: Secondary | ICD-10-CM

## 2013-12-07 DIAGNOSIS — Z87891 Personal history of nicotine dependence: Secondary | ICD-10-CM

## 2013-12-07 DIAGNOSIS — D379 Neoplasm of uncertain behavior of digestive organ, unspecified: Secondary | ICD-10-CM

## 2013-12-07 DIAGNOSIS — M25519 Pain in unspecified shoulder: Secondary | ICD-10-CM | POA: Diagnosis not present

## 2013-12-07 DIAGNOSIS — Z800 Family history of malignant neoplasm of digestive organs: Secondary | ICD-10-CM

## 2013-12-07 DIAGNOSIS — R110 Nausea: Secondary | ICD-10-CM | POA: Diagnosis not present

## 2013-12-07 DIAGNOSIS — D649 Anemia, unspecified: Secondary | ICD-10-CM | POA: Diagnosis present

## 2013-12-07 HISTORY — PX: PANCREATECTOMY: SHX5261

## 2013-12-07 HISTORY — DX: Acquired absence of ovaries, unilateral: Z90.721

## 2013-12-07 HISTORY — DX: Acquired absence of both cervix and uterus: Z90.710

## 2013-12-07 LAB — CBC
HCT: 37.3 % (ref 36.0–46.0)
Hemoglobin: 12.4 g/dL (ref 12.0–15.0)
MCH: 29.6 pg (ref 26.0–34.0)
MCHC: 33.2 g/dL (ref 30.0–36.0)
MCV: 89 fL (ref 78.0–100.0)
Platelets: 259 10*3/uL (ref 150–400)
RBC: 4.19 MIL/uL (ref 3.87–5.11)
RDW: 13.3 % (ref 11.5–15.5)
WBC: 11.4 10*3/uL — ABNORMAL HIGH (ref 4.0–10.5)

## 2013-12-07 LAB — CREATININE, SERUM
Creatinine, Ser: 0.76 mg/dL (ref 0.50–1.10)
GFR calc Af Amer: >90 mL/min (ref >90)
GFR calc non Af Amer: >90 mL/min (ref >90)

## 2013-12-07 SURGERY — PANCREATECTOMY, LAPAROSCOPIC
Anesthesia: General | Site: Abdomen

## 2013-12-07 MED ORDER — ONDANSETRON HCL 4 MG/2ML IJ SOLN
INTRAMUSCULAR | Status: DC | PRN
  Administered 2013-12-07: 4 mg via INTRAVENOUS

## 2013-12-07 MED ORDER — DEXAMETHASONE SODIUM PHOSPHATE 4 MG/ML IJ SOLN
INTRAMUSCULAR | Status: AC
  Filled 2013-12-07: qty 1

## 2013-12-07 MED ORDER — CARBOXYMETHYLCELLULOSE SODIUM 0.5 % OP SOLN: 1.0000 [drp] | Freq: Every day | OPHTHALMIC | Status: DC | PRN

## 2013-12-07 MED ORDER — LIDOCAINE HCL 1 % IJ SOLN
INTRAMUSCULAR | Status: DC | PRN
  Administered 2013-12-07: 15:00:00

## 2013-12-07 MED ORDER — ROCURONIUM BROMIDE 100 MG/10ML IV SOLN
INTRAVENOUS | Status: DC | PRN
  Administered 2013-12-07: 50 mg via INTRAVENOUS

## 2013-12-07 MED ORDER — KCL IN DEXTROSE-NACL 20-5-0.45 MEQ/L-%-% IV SOLN
INTRAVENOUS | Status: DC
  Administered 2013-12-07 – 2013-12-09 (×3): via INTRAVENOUS
  Filled 2013-12-07 (×7): qty 1000

## 2013-12-07 MED ORDER — ONDANSETRON HCL 4 MG/2ML IJ SOLN
INTRAMUSCULAR | Status: AC
  Filled 2013-12-07: qty 2

## 2013-12-07 MED ORDER — OXYCODONE HCL 5 MG PO TABS: 5.0000 mg | ORAL_TABLET | Freq: Once | ORAL | Status: DC | PRN

## 2013-12-07 MED ORDER — MORPHINE SULFATE 2 MG/ML IJ SOLN
1.0000 mg | INTRAMUSCULAR | Status: DC | PRN
  Administered 2013-12-07 – 2013-12-08 (×6): 2 mg via INTRAVENOUS
  Administered 2013-12-09: 4 mg via INTRAVENOUS
  Filled 2013-12-07 (×6): qty 1
  Filled 2013-12-07: qty 2
  Filled 2013-12-07: qty 1

## 2013-12-07 MED ORDER — ENOXAPARIN SODIUM 40 MG/0.4ML ~~LOC~~ SOLN
40.0000 mg | SUBCUTANEOUS | Status: DC
  Administered 2013-12-08 – 2013-12-12 (×5): 40 mg via SUBCUTANEOUS
  Filled 2013-12-07 (×6): qty 0.4

## 2013-12-07 MED ORDER — MIDAZOLAM HCL 2 MG/2ML IJ SOLN: 0.5000 mg | Freq: Once | INTRAMUSCULAR | Status: DC | PRN

## 2013-12-07 MED ORDER — EPHEDRINE SULFATE 50 MG/ML IJ SOLN
INTRAMUSCULAR | Status: DC | PRN
  Administered 2013-12-07: 10 mg via INTRAVENOUS
  Administered 2013-12-07: 5 mg via INTRAVENOUS
  Administered 2013-12-07: 10 mg via INTRAVENOUS

## 2013-12-07 MED ORDER — FENTANYL CITRATE 0.05 MG/ML IJ SOLN
INTRAMUSCULAR | Status: DC | PRN
Start: 2013-12-07 — End: 2013-12-07
  Administered 2013-12-07 (×2): 100 ug via INTRAVENOUS
  Administered 2013-12-07 (×2): 50 ug via INTRAVENOUS

## 2013-12-07 MED ORDER — ACETAMINOPHEN 500 MG PO TABS
1000.0000 mg | ORAL_TABLET | Freq: Four times a day (QID) | ORAL | Status: AC
  Administered 2013-12-07 – 2013-12-08 (×3): 1000 mg via ORAL
  Filled 2013-12-07 (×4): qty 2

## 2013-12-07 MED ORDER — KETOROLAC TROMETHAMINE 15 MG/ML IJ SOLN
15.0000 mg | Freq: Four times a day (QID) | INTRAMUSCULAR | Status: DC | PRN
  Administered 2013-12-08: 15 mg via INTRAVENOUS
  Filled 2013-12-07 (×2): qty 1

## 2013-12-07 MED ORDER — KETOROLAC TROMETHAMINE 30 MG/ML IJ SOLN
INTRAMUSCULAR | Status: AC
Start: 2013-12-07 — End: 2013-12-08
  Filled 2013-12-07: qty 1

## 2013-12-07 MED ORDER — SODIUM CHLORIDE 0.9 % IJ SOLN
INTRAMUSCULAR | Status: AC
  Filled 2013-12-07: qty 10

## 2013-12-07 MED ORDER — PROPOFOL 10 MG/ML IV BOLUS
INTRAVENOUS | Status: AC
  Filled 2013-12-07: qty 20

## 2013-12-07 MED ORDER — EVICEL 5 ML EX KIT
PACK | CUTANEOUS | Status: DC | PRN
  Administered 2013-12-07: 5 mL

## 2013-12-07 MED ORDER — GLYCOPYRROLATE 0.2 MG/ML IJ SOLN
INTRAMUSCULAR | Status: DC | PRN
  Administered 2013-12-07: .6 mg via INTRAVENOUS
  Administered 2013-12-07 (×2): 0.2 mg via INTRAVENOUS

## 2013-12-07 MED ORDER — SCOPOLAMINE 1 MG/3DAYS TD PT72
1.0000 | MEDICATED_PATCH | TRANSDERMAL | Status: DC
  Administered 2013-12-07: 1.5 mg via TRANSDERMAL

## 2013-12-07 MED ORDER — POLYVINYL ALCOHOL 1.4 % OP SOLN
1.0000 [drp] | OPHTHALMIC | Status: DC | PRN
  Filled 2013-12-07: qty 15

## 2013-12-07 MED ORDER — LORATADINE 10 MG PO TABS
10.0000 mg | ORAL_TABLET | Freq: Every day | ORAL | Status: DC
  Administered 2013-12-08 – 2013-12-12 (×5): 10 mg via ORAL
  Filled 2013-12-07 (×6): qty 1

## 2013-12-07 MED ORDER — PROMETHAZINE HCL 25 MG/ML IJ SOLN: 6.2500 mg | INTRAMUSCULAR | Status: DC | PRN

## 2013-12-07 MED ORDER — FLUTICASONE PROPIONATE 50 MCG/ACT NA SUSP
1.0000 | Freq: Every day | NASAL | Status: DC
  Administered 2013-12-08 – 2013-12-12 (×5): 1 via NASAL
  Filled 2013-12-07: qty 16

## 2013-12-07 MED ORDER — OXYCODONE HCL 5 MG/5ML PO SOLN: 5.0000 mg | Freq: Once | ORAL | Status: DC | PRN

## 2013-12-07 MED ORDER — VECURONIUM BROMIDE 10 MG IV SOLR
INTRAVENOUS | Status: AC
  Filled 2013-12-07: qty 10

## 2013-12-07 MED ORDER — FENTANYL CITRATE 0.05 MG/ML IJ SOLN
INTRAMUSCULAR | Status: AC
  Filled 2013-12-07: qty 5

## 2013-12-07 MED ORDER — LACTATED RINGERS IV SOLN
INTRAVENOUS | Status: DC | PRN
  Administered 2013-12-07 (×2): via INTRAVENOUS

## 2013-12-07 MED ORDER — MIDAZOLAM HCL 2 MG/2ML IJ SOLN
INTRAMUSCULAR | Status: AC
  Filled 2013-12-07: qty 2

## 2013-12-07 MED ORDER — ARTIFICIAL TEARS OP OINT
TOPICAL_OINTMENT | OPHTHALMIC | Status: DC | PRN
  Administered 2013-12-07: 1 via OPHTHALMIC

## 2013-12-07 MED ORDER — ALPRAZOLAM 0.5 MG PO TABS: 0.5000 mg | ORAL_TABLET | Freq: Every day | ORAL | Status: DC | PRN

## 2013-12-07 MED ORDER — LIDOCAINE HCL (CARDIAC) 20 MG/ML IV SOLN
INTRAVENOUS | Status: AC
  Filled 2013-12-07: qty 5

## 2013-12-07 MED ORDER — NEOSTIGMINE METHYLSULFATE 10 MG/10ML IV SOLN
INTRAVENOUS | Status: DC | PRN
  Administered 2013-12-07: 4 mg via INTRAVENOUS

## 2013-12-07 MED ORDER — GLYCOPYRROLATE 0.2 MG/ML IJ SOLN
INTRAMUSCULAR | Status: AC
  Filled 2013-12-07: qty 3

## 2013-12-07 MED ORDER — CEFAZOLIN SODIUM 1-5 GM-% IV SOLN
1.0000 g | Freq: Four times a day (QID) | INTRAVENOUS | Status: AC
  Administered 2013-12-07 – 2013-12-08 (×3): 1 g via INTRAVENOUS
  Filled 2013-12-07 (×3): qty 50

## 2013-12-07 MED ORDER — HYDROMORPHONE HCL PF 1 MG/ML IJ SOLN
0.2500 mg | INTRAMUSCULAR | Status: DC | PRN
  Administered 2013-12-07: 0.5 mg via INTRAVENOUS
  Administered 2013-12-07: 0.25 mg via INTRAVENOUS
  Administered 2013-12-07: 0.5 mg via INTRAVENOUS
  Administered 2013-12-07: 0.25 mg via INTRAVENOUS
  Administered 2013-12-07: 0.5 mg via INTRAVENOUS

## 2013-12-07 MED ORDER — DIPHENHYDRAMINE HCL 50 MG/ML IJ SOLN: 12.5000 mg | Freq: Four times a day (QID) | INTRAMUSCULAR | Status: DC | PRN

## 2013-12-07 MED ORDER — HYDROMORPHONE HCL PF 1 MG/ML IJ SOLN
INTRAMUSCULAR | Status: AC
  Filled 2013-12-07: qty 2

## 2013-12-07 MED ORDER — VECURONIUM BROMIDE 10 MG IV SOLR
INTRAVENOUS | Status: DC | PRN
  Administered 2013-12-07: 2 mg via INTRAVENOUS
  Administered 2013-12-07: 1 mg via INTRAVENOUS
  Administered 2013-12-07: 2 mg via INTRAVENOUS

## 2013-12-07 MED ORDER — LACTATED RINGERS IV SOLN
INTRAVENOUS | Status: DC
  Administered 2013-12-07: 50 mL/h via INTRAVENOUS
  Administered 2013-12-07: 14:00:00 via INTRAVENOUS

## 2013-12-07 MED ORDER — ESTRADIOL 0.05 MG/24HR TD PTWK
0.0500 mg | MEDICATED_PATCH | TRANSDERMAL | Status: DC
  Administered 2013-12-09: 0.05 mg via TRANSDERMAL
  Filled 2013-12-07: qty 1

## 2013-12-07 MED ORDER — DEXAMETHASONE SODIUM PHOSPHATE 4 MG/ML IJ SOLN
INTRAMUSCULAR | Status: DC | PRN
  Administered 2013-12-07: 4 mg via INTRAVENOUS

## 2013-12-07 MED ORDER — LIDOCAINE HCL (CARDIAC) 20 MG/ML IV SOLN
INTRAVENOUS | Status: DC | PRN
  Administered 2013-12-07: 50 mg via INTRAVENOUS

## 2013-12-07 MED ORDER — MIDAZOLAM HCL 5 MG/5ML IJ SOLN
INTRAMUSCULAR | Status: DC | PRN
  Administered 2013-12-07: 2 mg via INTRAVENOUS

## 2013-12-07 MED ORDER — PROPOFOL 10 MG/ML IV BOLUS
INTRAVENOUS | Status: DC | PRN
  Administered 2013-12-07: 120 mg via INTRAVENOUS

## 2013-12-07 MED ORDER — NEOSTIGMINE METHYLSULFATE 10 MG/10ML IV SOLN
INTRAVENOUS | Status: AC
  Filled 2013-12-07: qty 1

## 2013-12-07 MED ORDER — ARTIFICIAL TEARS OP OINT
TOPICAL_OINTMENT | OPHTHALMIC | Status: AC
  Filled 2013-12-07: qty 3.5

## 2013-12-07 MED ORDER — MEPERIDINE HCL 25 MG/ML IJ SOLN: 6.2500 mg | INTRAMUSCULAR | Status: DC | PRN

## 2013-12-07 MED ORDER — MONTELUKAST SODIUM 10 MG PO TABS
10.0000 mg | ORAL_TABLET | Freq: Every day | ORAL | Status: DC
  Administered 2013-12-07 – 2013-12-12 (×6): 10 mg via ORAL
  Filled 2013-12-07 (×9): qty 1

## 2013-12-07 MED ORDER — SODIUM CHLORIDE 0.9 % IR SOLN
Status: DC | PRN
  Administered 2013-12-07: 1000 mL

## 2013-12-07 MED ORDER — ONDANSETRON HCL 4 MG PO TABS
4.0000 mg | ORAL_TABLET | Freq: Four times a day (QID) | ORAL | Status: DC | PRN
  Administered 2013-12-11: 4 mg via ORAL
  Filled 2013-12-07: qty 1

## 2013-12-07 MED ORDER — ONDANSETRON HCL 4 MG/2ML IJ SOLN
4.0000 mg | Freq: Four times a day (QID) | INTRAMUSCULAR | Status: DC | PRN
  Administered 2013-12-08 (×2): 4 mg via INTRAVENOUS
  Filled 2013-12-07 (×3): qty 2

## 2013-12-07 MED ORDER — 0.9 % SODIUM CHLORIDE (POUR BTL) OPTIME
TOPICAL | Status: DC | PRN
  Administered 2013-12-07: 1000 mL

## 2013-12-07 MED ORDER — KETOROLAC TROMETHAMINE 15 MG/ML IJ SOLN
15.0000 mg | Freq: Four times a day (QID) | INTRAMUSCULAR | Status: AC
  Administered 2013-12-07 – 2013-12-08 (×3): 15 mg via INTRAVENOUS
  Filled 2013-12-07 (×2): qty 1

## 2013-12-07 SURGICAL SUPPLY — 88 items
APPLIER CLIP 5 13 M/L LIGAMAX5 (MISCELLANEOUS) ×2
BAG BILE T-TUBES STRL (MISCELLANEOUS) IMPLANT
BLADE SURG 10 STRL SS (BLADE) ×2 IMPLANT
BLADE SURG ROTATE 9660 (MISCELLANEOUS) IMPLANT
CANISTER SUCTION 2500CC (MISCELLANEOUS) ×2 IMPLANT
CATH KIT ON Q 7.5IN SLV (PAIN MANAGEMENT) IMPLANT
CATH ROBINSON RED A/P 14FR (CATHETERS) IMPLANT
CATH ROBINSON RED A/P 16FR (CATHETERS) IMPLANT
CHLORAPREP W/TINT 26ML (MISCELLANEOUS) ×2 IMPLANT
CLIP APPLIE 5 13 M/L LIGAMAX5 (MISCELLANEOUS) ×1 IMPLANT
CLIP LIGATING HEM O LOK PURPLE (MISCELLANEOUS) IMPLANT
CLIP LIGATING HEMO O LOK GREEN (MISCELLANEOUS) IMPLANT
CLIP LIGATING HEMOLOK MED (MISCELLANEOUS) IMPLANT
CLIP TI LARGE 6 (CLIP) IMPLANT
CLIP TI MEDIUM 24 (CLIP) IMPLANT
CLIP TI WIDE RED SMALL 24 (CLIP) IMPLANT
COVER SURGICAL LIGHT HANDLE (MISCELLANEOUS) ×4 IMPLANT
DERMABOND ADVANCED (GAUZE/BANDAGES/DRESSINGS) ×1
DERMABOND ADVANCED .7 DNX12 (GAUZE/BANDAGES/DRESSINGS) ×1 IMPLANT
DRAIN CHANNEL 19F RND (DRAIN) ×2 IMPLANT
DRAIN PENROSE 1/2X36 STERILE (WOUND CARE) IMPLANT
DRAPE UTILITY 15X26 W/TAPE STR (DRAPE) ×4 IMPLANT
DRAPE WARM FLUID 44X44 (DRAPE) ×2 IMPLANT
DRSG COVADERM 4X10 (GAUZE/BANDAGES/DRESSINGS) ×2 IMPLANT
DRSG COVADERM 4X14 (GAUZE/BANDAGES/DRESSINGS) ×2 IMPLANT
ELECT BLADE 6.5 EXT (BLADE) IMPLANT
ELECT CAUTERY BLADE 6.4 (BLADE) ×2 IMPLANT
ELECT REM PT RETURN 9FT ADLT (ELECTROSURGICAL) ×2
ELECTRODE REM PT RTRN 9FT ADLT (ELECTROSURGICAL) ×1 IMPLANT
EVACUATOR SILICONE 100CC (DRAIN) ×2 IMPLANT
GLOVE BIO SURGEON STRL SZ 6 (GLOVE) ×4 IMPLANT
GLOVE BIO SURGEON STRL SZ7 (GLOVE) ×2 IMPLANT
GLOVE BIO SURGEON STRL SZ7.5 (GLOVE) ×4 IMPLANT
GLOVE BIOGEL PI IND STRL 6.5 (GLOVE) ×1 IMPLANT
GLOVE BIOGEL PI IND STRL 7.0 (GLOVE) ×2 IMPLANT
GLOVE BIOGEL PI IND STRL 7.5 (GLOVE) ×2 IMPLANT
GLOVE BIOGEL PI INDICATOR 6.5 (GLOVE) ×1
GLOVE BIOGEL PI INDICATOR 7.0 (GLOVE) ×2
GLOVE BIOGEL PI INDICATOR 7.5 (GLOVE) ×2
GOWN STRL REUS W/ TWL LRG LVL3 (GOWN DISPOSABLE) ×2 IMPLANT
GOWN STRL REUS W/TWL 2XL LVL3 (GOWN DISPOSABLE) ×4 IMPLANT
GOWN STRL REUS W/TWL LRG LVL3 (GOWN DISPOSABLE) ×2
KIT BASIN OR (CUSTOM PROCEDURE TRAY) ×2 IMPLANT
KIT ROOM TURNOVER OR (KITS) ×2 IMPLANT
NS IRRIG 1000ML POUR BTL (IV SOLUTION) ×2 IMPLANT
PAD ARMBOARD 7.5X6 YLW CONV (MISCELLANEOUS) ×4 IMPLANT
PENCIL BUTTON HOLSTER BLD 10FT (ELECTRODE) ×2 IMPLANT
POUCH SPECIMEN RETRIEVAL 10MM (ENDOMECHANICALS) ×2 IMPLANT
RELOAD BLUE (STAPLE) ×2 IMPLANT
RELOAD WHITE ECR60W (STAPLE) ×4 IMPLANT
SCALPEL HARMONIC ACE (MISCELLANEOUS) ×2 IMPLANT
SCISSORS HARMONIC WAVE 18CM (INSTRUMENTS) IMPLANT
SET IRRIG TUBING LAPAROSCOPIC (IRRIGATION / IRRIGATOR) ×2 IMPLANT
SLEEVE ENDOPATH XCEL 5M (ENDOMECHANICALS) ×4 IMPLANT
SLEEVE SURGEON STRL (DRAPES) ×2 IMPLANT
SPONGE GAUZE 4X4 12PLY (GAUZE/BANDAGES/DRESSINGS) ×2 IMPLANT
SPONGE INTESTINAL PEANUT (DISPOSABLE) IMPLANT
STAPLE ECHEON FLEX 60 POW ENDO (STAPLE) ×4 IMPLANT
STAPLER VISISTAT 35W (STAPLE) ×2 IMPLANT
STRIP PERI DRY VERITAS 45 (STAPLE) ×2 IMPLANT
SUT ETHILON 2 0 FS 18 (SUTURE) ×2 IMPLANT
SUT MNCRL AB 4-0 PS2 18 (SUTURE) ×2 IMPLANT
SUT PDS AB 1 TP1 96 (SUTURE) IMPLANT
SUT PDS AB 3-0 SH 27 (SUTURE) IMPLANT
SUT PDS II 0 TP-1 LOOPED 60 (SUTURE) IMPLANT
SUT PROLENE 3 0 SH 48 (SUTURE) IMPLANT
SUT PROLENE 4 0 RB 1 (SUTURE)
SUT PROLENE 4 0 SH DA (SUTURE) IMPLANT
SUT PROLENE 4-0 RB1 .5 CRCL 36 (SUTURE) IMPLANT
SUT PROLENE 5 0 C1 (SUTURE) IMPLANT
SUT SILK 2 0 SH CR/8 (SUTURE) IMPLANT
SUT SILK 2 0 TIES 10X30 (SUTURE) IMPLANT
SUT SILK 3 0 SH CR/8 (SUTURE) IMPLANT
SUT SILK 3 0 TIES 10X30 (SUTURE) IMPLANT
SYS LAPSCP GELPORT 120MM (MISCELLANEOUS)
SYSTEM LAPSCP GELPORT 120MM (MISCELLANEOUS) IMPLANT
TIP RIGID 35CM EVICEL (HEMOSTASIS) IMPLANT
TOWEL OR 17X24 6PK STRL BLUE (TOWEL DISPOSABLE) IMPLANT
TOWEL OR 17X26 10 PK STRL BLUE (TOWEL DISPOSABLE) ×2 IMPLANT
TRAY FOLEY CATH 14FRSI W/METER (CATHETERS) ×2 IMPLANT
TRAY LAPAROSCOPIC (CUSTOM PROCEDURE TRAY) ×2 IMPLANT
TROCAR XCEL 12X100 BLDLESS (ENDOMECHANICALS) ×2 IMPLANT
TROCAR XCEL BLUNT TIP 100MML (ENDOMECHANICALS) IMPLANT
TROCAR XCEL NON-BLD 5MMX100MML (ENDOMECHANICALS) ×2 IMPLANT
TUBE CONNECTING 12X1/4 (SUCTIONS) ×2 IMPLANT
TUBING FILTER THERMOFLATOR (ELECTROSURGICAL) ×2 IMPLANT
TUNNELER SHEATH ON-Q 11GX8 DSP (PAIN MANAGEMENT) ×2 IMPLANT
YANKAUER SUCT BULB TIP NO VENT (SUCTIONS) ×2 IMPLANT

## 2013-12-07 NOTE — Anesthesia Preprocedure Evaluation (Addendum)
Anesthesia Evaluation  Patient identified by MRN, date of birth, ID band Patient awake    Reviewed: Allergy & Precautions, H&P , NPO status , Patient's Chart, lab work & pertinent test results, reviewed documented beta blocker date and time   History of Anesthesia Complications (+) PONV and history of anesthetic complications  Airway Mallampati: II TM Distance: >3 FB Neck ROM: Full    Dental  (+) Teeth Intact, Dental Advisory Given   Pulmonary COPD COPD inhaler, former smoker (quit 1988),  breath sounds clear to auscultation  Pulmonary exam normal       Cardiovascular negative cardio ROS  Rhythm:Regular Rate:Normal     Neuro/Psych negative neurological ROS     GI/Hepatic Neg liver ROS, Pancreatic lesion   Endo/Other  negative endocrine ROS  Renal/GU negative Renal ROS     Musculoskeletal   Abdominal   Peds  Hematology negative hematology ROS (+)   Anesthesia Other Findings   Reproductive/Obstetrics                          Anesthesia Physical Anesthesia Plan  ASA: II  Anesthesia Plan: General   Post-op Pain Management:    Induction: Intravenous  Airway Management Planned: Oral ETT  Additional Equipment:   Intra-op Plan:   Post-operative Plan: Extubation in OR  Informed Consent: I have reviewed the patients History and Physical, chart, labs and discussed the procedure including the risks, benefits and alternatives for the proposed anesthesia with the patient or authorized representative who has indicated his/her understanding and acceptance.   Dental advisory given  Plan Discussed with: Surgeon and CRNA  Anesthesia Plan Comments: (Plan routine monitors, GETA)        Anesthesia Quick Evaluation

## 2013-12-07 NOTE — Op Note (Signed)
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      ANESTHESIA:  General and local.      FINDINGS:  Distal pancreas adherent to splenic vein.  Firm portion of tail.  Pancreas medium in texture.  Frozen section - neoplastic, looks like probable IPMN, 2.5 cm from margin.       SPECIMEN:  Distal pancreas  to Pathology.      ESTIMATED BLOOD LOSS:  50 mL      COMPLICATIONS:  None known.      PROCEDURE:  Patient was identified in the holding area and taken to   operating room where she was placed supine on the operating room table.   General anesthesia was induced.  Foley catheter was placed.  She was   placed in the leaning spleen position.  Her abdomen was prepped and   draped in sterile fashion.  Time-out was performed according to surgical   safety check list.  When all was correct, we continued.    A 5 mm Optiview trocar was placed in the LUQ under direct visualization.  Pneumoperitoneum was achieved to a pressure of 15 mm Hg.  Two additional 5 mm ports were placed in the midline and then 1 was placed in the left upper quadrant as well.  The patient had some adhesions in the abdominal wall.  They were taken down with the Harmonic.  The lienocolic ligament was   taken down with the Harmonic.  The lesser sac was opened with the   Harmonic.  Some of the short gastrics were taken down, but the upper ones were left.  The inferior border of the pancreas was identified and this was opened up with the Harmonic.  The pancreas and the splenic artery and vein were elevated.     The pancreas was then attempted to be isolated off  the vessels.  However, the pancreas was adherent to the splenic vein.  A small hole was seen in the splenic vein that was not amenable to clipping.  The epigastric port was upsized to a 12 mm  size.  The splenic vein and artery were stapled with a white load of the echelon stapler.  The pancreas was stapled with the blue load with the peristrips of the Eschelon stapler.   The upper portion of the spleen was viable.   The specimen was then removed from the 12 mm port site in an endocatch bag.  The area was inspected for hemostasis, and nothing was bleeding.   The pancreatic specimen was examined and the cutoff of the duct of the   pancreas was seen to be around 1 cm from the staple line.  This was sent for frozen section.    The abdomen was irrigated and there was   no sign of any additional bleeding.  Evicel was placed over the tail of   the pancreas.  The 19 Blake drain was then passed into the abdomen through   the epigastric port and pulled out through one of the other 5 mm trocars.The epigastric port was closed with two figure of eight sutures of 0-0 vicryl.  This was  airtight.     The skin of all the incisions was   then closed with 4-0 Monocryl in  a subcuticular fashion and then dressed   with dermabond.  The patient tolerated   the procedure well.  She was extubated and taken to the PACU in stable   condition.  Needle, sponge, and instrument counts were correct x2               Stark Klein, MD

## 2013-12-07 NOTE — H&P (View-Only) (Signed)
Chief Complaint  Patient presents with  . New Evaluation    eval tail pancreas resection    HISTORY: Pt is a 58 yo F who had an incidental finding in the pancreas when she had a pelvic abscess following a lap assisted vaginal hysterectomy.  Her CT scan showed an abrupt cutoff of the pancreatic duct size with distal atrophy.  MR and EUS confirmed this.  She had ectatic main duct and ectatic side ducts of the distal pancreas.  She is not having weight loss.  She has never had pancreatitis of which she is aware.  She has never been diabetic or told she has any issues with her glucose metabolism.  She has a family history of colon cancer in her mother, but no history of pancreatic cancer.    Past Medical History  Diagnosis Date  . SVD (spontaneous vaginal delivery)     x 1  . Seasonal allergies   . Fibroids 09/07/2013  . Anemia     on iron  . PONV (postoperative nausea and vomiting)     non recent     Past Surgical History  Procedure Laterality Date  . Colonoscopy      2 or 3  . Dilation and curettage of uterus      x 3  . Wisdom tooth extraction    . Eye surgery      lasik bilateral  . Robotic assisted total hysterectomy with bilateral salpingo oopherectomy Bilateral 09/07/2013    Procedure: ROBOTIC ASSISTED TOTAL HYSTERECTOMY WITH BILATERAL SALPINGO OOPHORECTOMY;  Surgeon: Elveria Royals, MD;  Location: [facility];  Service: Gynecology;  Laterality: Bilateral;  . Laparoscopy N/A 09/25/2013    Procedure: LAPAROSCOPY OPERATIVE;  Surgeon: Elveria Royals, MD;  Location: [facility];  Service: Gynecology;  Laterality: N/A;  . Eus N/A 11/16/2013    Procedure: UPPER ENDOSCOPIC ULTRASOUND (EUS) LINEAR;  Surgeon: Milus Banister, MD;  Location: [facility];  Service: Endoscopy;  Laterality: N/A;    Current Outpatient Prescriptions  Medication Sig Dispense Refill  . ascorbic acid (VITAMIN C) 1000 MG tablet Take 1,000 mg by mouth daily.      Marland Kitchen b complex vitamins tablet Take 1 tablet by mouth daily.       . Calcium Carb-Cholecalciferol (CALCIUM 600 + D PO) Take 1 tablet by mouth daily.      . carboxymethylcellulose (REFRESH PLUS) 0.5 % SOLN Place 1 drop into both eyes as needed (Dry eyes).      Marland Kitchen estradiol (VIVELLE-DOT) 0.05 MG/24HR patch Place 2 patches (0.1 mg total) onto the skin 2 (two) times a week. Wed & Sat  8 patch  12  . estradiol (VIVELLE-DOT) 0.1 MG/24HR patch Place 1 patch onto the skin 2 (two) times a week. Wednesday and Saturday. Takes with the 0.8m patch      . ferrous sulfate 325 (65 FE) MG tablet Take 325 mg by mouth daily with breakfast.      . fexofenadine-pseudoephedrine (ALLEGRA-D) 60-120 MG per tablet Take 1 tablet by mouth daily.      . fluticasone (FLONASE) 50 MCG/ACT nasal spray Place 2 sprays into both nostrils daily.      .Marland Kitchenibuprofen (ADVIL) 200 MG tablet Take 3 tablets (600 mg total) by mouth every 6 (six) hours as needed.  30 tablet  0  . Lactobacillus (ACIDOPHILUS PO) Take 1 tablet by mouth daily.      . Magnesium 250 MG TABS Take 250 mg by mouth daily.      .Marland Kitchen  meclizine (DRAMAMINE II) 25 MG tablet Take 25 mg by mouth at bedtime.      . montelukast (SINGULAIR) 10 MG tablet Take 10 mg by mouth at bedtime.      . Multiple Vitamin (MULTIVITAMIN) tablet Take 1 tablet by mouth daily.      . Omega-3 Fatty Acids (FISH OIL) 1000 MG CAPS Take 1 capsule by mouth daily.       No current facility-administered medications for this visit.     No Known Allergies   Family History  Problem Relation Age of Onset  . Cancer Mother 29    colon,lung   . COPD Mother      History   Social History  . Marital Status: Married    Spouse Name: N/A    Number of Children: N/A  . Years of Education: N/A   Social History Main Topics  . Smoking status: Former Smoker -- 1.00 packs/day for 10 years    Types: Cigarettes    Quit date: 01/02/1987  . Smokeless tobacco: Never Used  . Alcohol Use: Yes     Comment: socially  . Drug Use: No  . Sexual Activity: Not Currently     Birth Control/ Protection: Post-menopausal, Surgical   Other Topics Concern  . None   Social History Narrative  . None     REVIEW OF SYSTEMS - PERTINENT POSITIVES ONLY: 12 point review of systems negative other than HPI and PMH  EXAM: Filed Vitals:   11/27/13 1111  BP: 116/80  Pulse: 80  Temp: 97.4 F (36.3 C)  Resp: 14    Wt Readings from Last 3 Encounters:  11/27/13 163 lb (73.936 kg)  11/16/13 160 lb (72.576 kg)  11/16/13 160 lb (72.576 kg)     Gen:  No acute distress.  Well nourished and well groomed.   Neurological: Alert and oriented to person, place, and time. Coordination normal.  Head: Normocephalic and atraumatic.  Eyes: Conjunctivae are normal. Pupils are equal, round, and reactive to light. No scleral icterus.  Neck: Normal range of motion. Neck supple. No tracheal deviation or thyromegaly present.  Cardiovascular: Normal rate, regular rhythm.  Intact distal pulses. Respiratory: Effort normal.  No respiratory distress. No chest wall tenderness. Breath sounds normal.  No wheezes, rales or rhonchi.  GI: Soft. Bowel sounds are normal. The abdomen is soft and nontender.  There is no rebound and no guarding.  Musculoskeletal: Normal range of motion. Extremities are nontender.  Lymphadenopathy: No cervical, preauricular, postauricular or axillary adenopathy is present Skin: Skin is warm and dry. No rash noted. No diaphoresis. No erythema. No pallor. No clubbing, cyanosis, or edema.   Psychiatric: Normal mood and affect. Behavior is normal. Judgment and thought content normal.    LABORATORY RESULTS: Available labs are reviewed   Recent Results (from the past 2160 hour(s))  CBC     Status: None   Collection Time    09/04/13  3:46 PM      Result Value Ref Range   WBC 5.9  4.0 - 10.5 K/uL   RBC 4.10  3.87 - 5.11 MIL/uL   Hemoglobin 12.5  12.0 - 15.0 g/dL   HCT 37.4  36.0 - 46.0 %   MCV 91.2  78.0 - 100.0 fL   MCH 30.5  26.0 - 34.0 pg   MCHC 33.4  30.0 -  36.0 g/dL   RDW 13.0  11.5 - 15.5 %   Platelets 304  150 - 400 K/uL  COMPREHENSIVE METABOLIC PANEL  Status: Abnormal   Collection Time    09/04/13  3:46 PM      Result Value Ref Range   Sodium 140  137 - 147 mEq/L   Potassium 3.9  3.7 - 5.3 mEq/L   Chloride 101  96 - 112 mEq/L   CO2 30  19 - 32 mEq/L   Glucose, Bld 138 (*) 70 - 99 mg/dL   BUN 16  6 - 23 mg/dL   Creatinine, Ser 1.01  0.50 - 1.10 mg/dL   Calcium 9.4  8.4 - 10.5 mg/dL   Total Protein 7.1  6.0 - 8.3 g/dL   Albumin 3.9  3.5 - 5.2 g/dL   AST 17  0 - 37 U/L   ALT 17  0 - 35 U/L   Alkaline Phosphatase 52  39 - 117 U/L   Total Bilirubin <0.2 (*) 0.3 - 1.2 mg/dL   Comment: REPEATED TO VERIFY   GFR calc non Af Amer 61 (*) >90 mL/min   GFR calc Af Amer 71 (*) >90 mL/min   Comment: (NOTE)     The eGFR has been calculated using the CKD EPI equation.     This calculation has not been validated in all clinical situations.     eGFR's persistently <90 mL/min signify possible Chronic Kidney     Disease.  CBC     Status: Abnormal   Collection Time    09/08/13  5:45 AM      Result Value Ref Range   WBC 8.2  4.0 - 10.5 K/uL   RBC 3.44 (*) 3.87 - 5.11 MIL/uL   Hemoglobin 10.4 (*) 12.0 - 15.0 g/dL   HCT 31.5 (*) 36.0 - 46.0 %   MCV 91.6  78.0 - 100.0 fL   MCH 30.2  26.0 - 34.0 pg   MCHC 33.0  30.0 - 36.0 g/dL   RDW 12.9  11.5 - 15.5 %   Platelets 242  150 - 400 K/uL  CBC WITH DIFFERENTIAL     Status: Abnormal   Collection Time    09/18/13  4:31 AM      Result Value Ref Range   WBC 13.5 (*) 4.0 - 10.5 K/uL   RBC 3.92  3.87 - 5.11 MIL/uL   Hemoglobin 12.0  12.0 - 15.0 g/dL   HCT 35.1 (*) 36.0 - 46.0 %   MCV 89.5  78.0 - 100.0 fL   MCH 30.6  26.0 - 34.0 pg   MCHC 34.2  30.0 - 36.0 g/dL   RDW 12.5  11.5 - 15.5 %   Platelets 366  150 - 400 K/uL   Neutrophils Relative % 78 (*) 43 - 77 %   Neutro Abs 10.6 (*) 1.7 - 7.7 K/uL   Lymphocytes Relative 10 (*) 12 - 46 %   Lymphs Abs 1.4  0.7 - 4.0 K/uL   Monocytes Relative 10   3 - 12 %   Monocytes Absolute 1.3 (*) 0.1 - 1.0 K/uL   Eosinophils Relative 2  0 - 5 %   Eosinophils Absolute 0.3  0.0 - 0.7 K/uL   Basophils Relative 0  0 - 1 %   Basophils Absolute 0.0  0.0 - 0.1 K/uL  COMPREHENSIVE METABOLIC PANEL     Status: Abnormal   Collection Time    09/18/13  4:31 AM      Result Value Ref Range   Sodium 139  137 - 147 mEq/L   Potassium 4.5  3.7 - 5.3 mEq/L  Chloride 101  96 - 112 mEq/L   CO2 24  19 - 32 mEq/L   Glucose, Bld 123 (*) 70 - 99 mg/dL   BUN 12  6 - 23 mg/dL   Creatinine, Ser 0.71  0.50 - 1.10 mg/dL   Calcium 9.4  8.4 - 10.5 mg/dL   Total Protein 7.4  6.0 - 8.3 g/dL   Albumin 3.5  3.5 - 5.2 g/dL   AST 27  0 - 37 U/L   ALT 45 (*) 0 - 35 U/L   Alkaline Phosphatase 64  39 - 117 U/L   Total Bilirubin 0.4  0.3 - 1.2 mg/dL   GFR calc non Af Amer >90  >90 mL/min   GFR calc Af Amer >90  >90 mL/min   Comment: (NOTE)     The eGFR has been calculated using the CKD EPI equation.     This calculation has not been validated in all clinical situations.     eGFR's persistently <90 mL/min signify possible Chronic Kidney     Disease.  CBC WITH DIFFERENTIAL     Status: Abnormal   Collection Time    09/19/13  5:37 AM      Result Value Ref Range   WBC 13.4 (*) 4.0 - 10.5 K/uL   RBC 3.32 (*) 3.87 - 5.11 MIL/uL   Hemoglobin 10.0 (*) 12.0 - 15.0 g/dL   HCT 30.1 (*) 36.0 - 46.0 %   MCV 90.7  78.0 - 100.0 fL   MCH 30.1  26.0 - 34.0 pg   MCHC 33.2  30.0 - 36.0 g/dL   RDW 12.6  11.5 - 15.5 %   Platelets 328  150 - 400 K/uL   Neutrophils Relative % 73  43 - 77 %   Neutro Abs 9.7 (*) 1.7 - 7.7 K/uL   Lymphocytes Relative 13  12 - 46 %   Lymphs Abs 1.8  0.7 - 4.0 K/uL   Monocytes Relative 12  3 - 12 %   Monocytes Absolute 1.6 (*) 0.1 - 1.0 K/uL   Eosinophils Relative 2  0 - 5 %   Eosinophils Absolute 0.3  0.0 - 0.7 K/uL   Basophils Relative 0  0 - 1 %   Basophils Absolute 0.1  0.0 - 0.1 K/uL  CBC WITH DIFFERENTIAL     Status: Abnormal   Collection Time     09/20/13  5:02 AM      Result Value Ref Range   WBC 13.2 (*) 4.0 - 10.5 K/uL   RBC 3.32 (*) 3.87 - 5.11 MIL/uL   Hemoglobin 9.9 (*) 12.0 - 15.0 g/dL   HCT 29.9 (*) 36.0 - 46.0 %   MCV 90.1  78.0 - 100.0 fL   MCH 29.8  26.0 - 34.0 pg   MCHC 33.1  30.0 - 36.0 g/dL   RDW 12.4  11.5 - 15.5 %   Platelets 321  150 - 400 K/uL   Neutrophils Relative % 74  43 - 77 %   Neutro Abs 9.8 (*) 1.7 - 7.7 K/uL   Lymphocytes Relative 11 (*) 12 - 46 %   Lymphs Abs 1.5  0.7 - 4.0 K/uL   Monocytes Relative 11  3 - 12 %   Monocytes Absolute 1.4 (*) 0.1 - 1.0 K/uL   Eosinophils Relative 4  0 - 5 %   Eosinophils Absolute 0.5  0.0 - 0.7 K/uL   Basophils Relative 1  0 - 1 %   Basophils Absolute  0.1  0.0 - 0.1 K/uL  CBC     Status: Abnormal   Collection Time    09/25/13  3:58 PM      Result Value Ref Range   WBC 12.9 (*) 4.0 - 10.5 K/uL   RBC 3.83 (*) 3.87 - 5.11 MIL/uL   Hemoglobin 11.5 (*) 12.0 - 15.0 g/dL   HCT 34.2 (*) 36.0 - 46.0 %   MCV 89.3  78.0 - 100.0 fL   MCH 30.0  26.0 - 34.0 pg   MCHC 33.6  30.0 - 36.0 g/dL   RDW 12.6  11.5 - 15.5 %   Platelets 500 (*) 150 - 400 K/uL  BASIC METABOLIC PANEL     Status: Abnormal   Collection Time    09/25/13  3:58 PM      Result Value Ref Range   Sodium 135 (*) 137 - 147 mEq/L   Potassium 3.6 (*) 3.7 - 5.3 mEq/L   Chloride 96  96 - 112 mEq/L   CO2 25  19 - 32 mEq/L   Glucose, Bld 99  70 - 99 mg/dL   BUN 10  6 - 23 mg/dL   Creatinine, Ser 0.85  0.50 - 1.10 mg/dL   Calcium 9.4  8.4 - 10.5 mg/dL   GFR calc non Af Amer 75 (*) >90 mL/min   GFR calc Af Amer 87 (*) >90 mL/min   Comment: (NOTE)     The eGFR has been calculated using the CKD EPI equation.     This calculation has not been validated in all clinical situations.     eGFR's persistently <90 mL/min signify possible Chronic Kidney     Disease.  DIFFERENTIAL     Status: Abnormal   Collection Time    09/25/13  3:58 PM      Result Value Ref Range   Neutrophils Relative % 74  43 - 77 %    Neutro Abs 8.9 (*) 1.7 - 7.7 K/uL   Lymphocytes Relative 19  12 - 46 %   Lymphs Abs 2.3  0.7 - 4.0 K/uL   Monocytes Relative 4  3 - 12 %   Monocytes Absolute 0.5  0.1 - 1.0 K/uL   Eosinophils Relative 3  0 - 5 %   Eosinophils Absolute 0.3  0.0 - 0.7 K/uL   Basophils Relative 0  0 - 1 %   Basophils Absolute 0.0  0.0 - 0.1 K/uL     RADIOLOGY RESULTS:  CT abd/pelvis  IMPRESSION: Pancreatic tail ductal dilatation and atrophy with abrupt transition to normal duct at the tail/ body junction. 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 A non border deforming early/subtle carcinoma could look similar. If the patient is a good endoscopic ultrasound candidate, this should be considered. If the patient is a poor endoscopic ultrasound candidate, and/or there has a known history of prior pancreatitis, follow-up with pre and post contrast MRI at 6-8 weeks is an alternate clinical strategy.  MRI abd IMPRESSION: 1. Multiloculated pelvic fluid collections, likely abscess, with extent described above. The largest pocket is along the right pelvic sidewall, 7 x 4 x 4 cm. 2. Reactive colitis of the sigmoid and rectum. 3. Ductal dilatation and parenchymal atrophy of the pancreatic tail. Follow-up pancreas MRI recommended to exclude obstructing mass. Non-emergent MRI should be deferred until patient has been discharged for the acute illness, and can optimally cooperate with positioning and breath-holding instructions.  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    There is no mass visible, but dilation of main duct is abnormal.    Recommend resection.  Would have a goal of leaving spleen in place.  Would send off intraoperatively to assess if cancer present.  Pt desires to get this  done soon.    I do not think there would be harm in some delay, but main duct IMPNs do have higher risk of cancer and this would likely not alter our recommendation.  She is also going to seek a second opinion at [facility].  I have emailed Pilar Plate at [facility] to see if she can see her.    We reviewed the risks of pancreatic leak, bleeding, infection, damage to adjacent structures, blood clot, cardiac or pulmonary complications, prolonged fatigue, diabetes, and exocrine pancreatic insufficiency.         Milus Height MD Surgical Oncology, General and [facility]      Visit Diagnoses: 1. IPMN (intraductal papillary mucinous neoplasm)     Primary Care Physician: Tivis Ringer, MD  Oretha Caprice MD Gastroenterology

## 2013-12-07 NOTE — Brief Op Note (Signed)
Waste Fentanyl 200 mg with Davina Poke, Pharm D.

## 2013-12-07 NOTE — Interval H&P Note (Signed)
History and Physical Interval Note:  12/07/2013 1:09 PM  [place]  has presented today for surgery, with the diagnosis of IPMN pancreas  The various methods of treatment have been discussed with the patient and family. After consideration of risks, benefits and other options for treatment, the patient has consented to  Procedure(s): LAPAROSCOPIC DISTAL  PANCREATECTOMY, POSSIBLE SPLENECTOMY (N/A) as a surgical intervention .  The patient's history has been reviewed, patient examined, no change in status, stable for surgery.  I have reviewed the patient's chart and labs.  Questions were answered to the patient's satisfaction.     Stark Klein

## 2013-12-07 NOTE — Anesthesia Procedure Notes (Signed)
Procedure Name: Intubation Date/Time: 12/07/2013 2:05 PM Performed by: [place] Pink Pre-anesthesia Checklist: Patient identified, Timeout performed, Emergency Drugs available, Suction available and Patient being monitored Patient Re-evaluated:Patient Re-evaluated prior to inductionOxygen Delivery Method: Circle system utilized Preoxygenation: Pre-oxygenation with 100% oxygen Intubation Type: IV induction Ventilation: Mask ventilation without difficulty Laryngoscope Size: Mac and 3 Grade View: Grade I Tube type: Oral Tube size: 7.0 mm Number of attempts: 1 Airway Equipment and Method: Stylet Placement Confirmation: ETT inserted through vocal cords under direct vision,  positive ETCO2 and breath sounds checked- equal and bilateral Secured at: 20 cm Tube secured with: Tape Dental Injury: Teeth and Oropharynx as per pre-operative assessment

## 2013-12-07 NOTE — Transfer of Care (Signed)
Immediate Anesthesia Transfer of Care Note  Patient: [name]  Procedure(s) Performed: Procedure(s): LAPAROSCOPIC DISTAL  PANCREATECTOMY (N/A)  Patient Location: PACU  Anesthesia Type:General  Level of Consciousness: awake, alert  and oriented  Airway & Oxygen Therapy: Patient Spontanous Breathing  Post-op Assessment: Report given to PACU RN  Post vital signs: Reviewed and stable  Complications: No apparent anesthesia complications

## 2013-12-08 ENCOUNTER — Encounter (HOSPITAL_COMMUNITY): Payer: Self-pay | Admitting: General Surgery

## 2013-12-08 LAB — BASIC METABOLIC PANEL
BUN: 9 mg/dL (ref 6–23)
CO2: 23 mEq/L (ref 19–32)
Calcium: 8.2 mg/dL — ABNORMAL LOW (ref 8.4–10.5)
Chloride: 101 mEq/L (ref 96–112)
Creatinine, Ser: 0.71 mg/dL (ref 0.50–1.10)
GFR calc Af Amer: >90 mL/min (ref >90)
GFR calc non Af Amer: >90 mL/min (ref >90)
Glucose, Bld: 135 mg/dL — ABNORMAL HIGH (ref 70–99)
Potassium: 4.2 mEq/L (ref 3.7–5.3)
Sodium: 137 mEq/L (ref 137–147)

## 2013-12-08 LAB — CBC
HCT: 34.6 % — ABNORMAL LOW (ref 36.0–46.0)
Hemoglobin: 11.5 g/dL — ABNORMAL LOW (ref 12.0–15.0)
MCH: 29.5 pg (ref 26.0–34.0)
MCHC: 33.2 g/dL (ref 30.0–36.0)
MCV: 88.7 fL (ref 78.0–100.0)
Platelets: 256 10*3/uL (ref 150–400)
RBC: 3.9 MIL/uL (ref 3.87–5.11)
RDW: 13.4 % (ref 11.5–15.5)
WBC: 8.4 10*3/uL (ref 4.0–10.5)

## 2013-12-08 NOTE — Anesthesia Postprocedure Evaluation (Signed)
  Anesthesia Post-op Note  Patient: [name]  Procedure(s) Performed: Procedure(s): LAPAROSCOPIC DISTAL  PANCREATECTOMY (N/A)  Patient Location: PACU  Anesthesia Type:General  Level of Consciousness: awake and alert   Airway and Oxygen Therapy: Patient Spontanous Breathing  Post-op Pain: mild  Post-op Assessment: Post-op Vital signs reviewed  Post-op Vital Signs: stable  Last Vitals:  Filed Vitals:   12/08/13 0549  BP: 113/49  Pulse: 72  Temp: 36.4 C  Resp: 16    Complications: No apparent anesthesia complications

## 2013-12-08 NOTE — Progress Notes (Signed)
1 Day Post-Op  Subjective: Minimal nausea.  Main complaint is left shoulder pain.    Objective: Vital signs in last 24 hours: Temp:  [97.5 F (36.4 C)-98.1 F (36.7 C)] 97.5 F (36.4 C) (05/08 0549) Pulse Rate:  [52-81] 72 (05/08 0549) Resp:  [9-19] 16 (05/08 0549) BP: (108-136)/(49-71) 113/49 mmHg (05/08 0549) SpO2:  [96 %-100 %] 99 % (05/08 0549) Weight:  [162 lb 9 oz (73.738 kg)] 162 lb 9 oz (73.738 kg) (05/07 1150)    Intake/Output from previous day: 05/07 0701 - 05/08 0700 In: 3364.3 [I.V.:3189.3; IV Piggyback:50] Out: 850 [Urine:695; Drains:80; Blood:75] Intake/Output this shift: Total I/O In: -  Out: 10 [Drains:10]  General appearance: alert, cooperative and no distress Resp: breathing comfortably GI: soft, approp tender.  non distended.  Lab Results:   Recent Labs  12/07/13 2035 12/08/13 0800  WBC 11.4* 8.4  HGB 12.4 11.5*  HCT 37.3 34.6*  PLT 259 256   BMET  Recent Labs  12/07/13 2035  CREATININE 0.76   PT/INR No results found for this basename: LABPROT, INR,  in the last 72 hours ABG No results found for this basename: PHART, PCO2, PO2, HCO3,  in the last 72 hours  Studies/Results: No results found.  Anti-infectives: Anti-infectives   Start     Dose/Rate Route Frequency Ordered Stop   12/07/13 1915  ceFAZolin (ANCEF) IVPB 1 g/50 mL premix     1 g 100 mL/hr over 30 Minutes Intravenous Every 6 hours 12/07/13 1908 12/08/13 0814   12/07/13 0600  ceFAZolin (ANCEF) IVPB 2 g/50 mL premix     2 g 100 mL/hr over 30 Minutes Intravenous On call to O.R. 12/06/13 1404 12/07/13 1419      Assessment/Plan: s/p Procedure(s): LAPAROSCOPIC DISTAL  PANCREATECTOMY (N/A) d/c foley sips of clears. Ambulate, IS.  LOS: 1 day    [name] 12/08/2013

## 2013-12-09 ENCOUNTER — Encounter (HOSPITAL_COMMUNITY): Payer: Self-pay | Admitting: Surgery

## 2013-12-09 DIAGNOSIS — F411 Generalized anxiety disorder: Secondary | ICD-10-CM

## 2013-12-09 LAB — CBC
HCT: 35.8 % — ABNORMAL LOW (ref 36.0–46.0)
Hemoglobin: 11.5 g/dL — ABNORMAL LOW (ref 12.0–15.0)
MCH: 29.3 pg (ref 26.0–34.0)
MCHC: 32.1 g/dL (ref 30.0–36.0)
MCV: 91.3 fL (ref 78.0–100.0)
Platelets: 254 10*3/uL (ref 150–400)
RBC: 3.92 MIL/uL (ref 3.87–5.11)
RDW: 13.6 % (ref 11.5–15.5)
WBC: 9.8 10*3/uL (ref 4.0–10.5)

## 2013-12-09 LAB — BASIC METABOLIC PANEL
BUN: 4 mg/dL — ABNORMAL LOW (ref 6–23)
CO2: 25 mEq/L (ref 19–32)
Calcium: 8.3 mg/dL — ABNORMAL LOW (ref 8.4–10.5)
Chloride: 101 mEq/L (ref 96–112)
Creatinine, Ser: 0.67 mg/dL (ref 0.50–1.10)
GFR calc Af Amer: >90 mL/min (ref >90)
GFR calc non Af Amer: >90 mL/min (ref >90)
Glucose, Bld: 118 mg/dL — ABNORMAL HIGH (ref 70–99)
Potassium: 3.8 mEq/L (ref 3.7–5.3)
Sodium: 135 mEq/L — ABNORMAL LOW (ref 137–147)

## 2013-12-09 MED ORDER — SACCHAROMYCES BOULARDII 250 MG PO CAPS
250.0000 mg | ORAL_CAPSULE | Freq: Two times a day (BID) | ORAL | Status: DC
  Administered 2013-12-09 – 2013-12-12 (×8): 250 mg via ORAL
  Filled 2013-12-09 (×10): qty 1

## 2013-12-09 MED ORDER — PSYLLIUM 95 % PO PACK
1.0000 | PACK | Freq: Every day | ORAL | Status: DC
  Administered 2013-12-09: 14:00:00 via ORAL
  Administered 2013-12-11 – 2013-12-12 (×2): 1 via ORAL
  Filled 2013-12-09 (×6): qty 1

## 2013-12-09 MED ORDER — HYDROMORPHONE HCL PF 1 MG/ML IJ SOLN
0.5000 mg | INTRAMUSCULAR | Status: DC | PRN
  Administered 2013-12-09: 1 mg via INTRAVENOUS
  Filled 2013-12-09: qty 1

## 2013-12-09 MED ORDER — LIP MEDEX EX OINT
1.0000 "application " | TOPICAL_OINTMENT | Freq: Two times a day (BID) | CUTANEOUS | Status: DC
  Administered 2013-12-09 – 2013-12-12 (×5): 1 via TOPICAL
  Filled 2013-12-09 (×2): qty 7

## 2013-12-09 MED ORDER — VITAMIN C 500 MG PO TABS
1000.0000 mg | ORAL_TABLET | Freq: Every day | ORAL | Status: DC
  Filled 2013-12-09 (×5): qty 2

## 2013-12-09 MED ORDER — PSYLLIUM 95 % PO PACK: 1.0000 | PACK | Freq: Two times a day (BID) | ORAL | Status: DC

## 2013-12-09 MED ORDER — LACTATED RINGERS IV BOLUS (SEPSIS): 1000.0000 mL | Freq: Three times a day (TID) | INTRAVENOUS | Status: AC | PRN

## 2013-12-09 MED ORDER — PROMETHAZINE HCL 25 MG/ML IJ SOLN
6.2500 mg | Freq: Four times a day (QID) | INTRAMUSCULAR | Status: DC | PRN
  Administered 2013-12-11 (×2): 12.5 mg via INTRAVENOUS
  Filled 2013-12-09 (×2): qty 1

## 2013-12-09 MED ORDER — MAGIC MOUTHWASH: 15.0000 mL | Freq: Four times a day (QID) | ORAL | Status: DC | PRN

## 2013-12-09 MED ORDER — MENTHOL 3 MG MT LOZG: 1.0000 | LOZENGE | OROMUCOSAL | Status: DC | PRN

## 2013-12-09 MED ORDER — ALUM & MAG HYDROXIDE-SIMETH 200-200-20 MG/5ML PO SUSP: 30.0000 mL | Freq: Four times a day (QID) | ORAL | Status: DC | PRN

## 2013-12-09 MED ORDER — ACETAMINOPHEN 500 MG PO TABS
1000.0000 mg | ORAL_TABLET | Freq: Three times a day (TID) | ORAL | Status: DC
  Administered 2013-12-09 – 2013-12-10 (×3): 1000 mg via ORAL
  Filled 2013-12-09 (×5): qty 2

## 2013-12-09 MED ORDER — METOPROLOL TARTRATE 1 MG/ML IV SOLN: 5.0000 mg | Freq: Four times a day (QID) | INTRAVENOUS | Status: DC | PRN

## 2013-12-09 MED ORDER — GUAIFENESIN-DM 100-10 MG/5ML PO SYRP: 15.0000 mL | ORAL_SOLUTION | ORAL | Status: DC | PRN

## 2013-12-09 MED ORDER — ALPRAZOLAM 0.5 MG PO TABS
0.5000 mg | ORAL_TABLET | Freq: Three times a day (TID) | ORAL | Status: DC | PRN
  Filled 2013-12-09: qty 2

## 2013-12-09 MED ORDER — BISACODYL 10 MG RE SUPP
10.0000 mg | Freq: Two times a day (BID) | RECTAL | Status: DC | PRN
  Administered 2013-12-12: 10 mg via RECTAL
  Filled 2013-12-09: qty 1

## 2013-12-09 MED ORDER — ADULT MULTIVITAMIN W/MINERALS CH
1.0000 | ORAL_TABLET | Freq: Every day | ORAL | Status: DC
  Filled 2013-12-09 (×5): qty 1

## 2013-12-09 MED ORDER — OXYCODONE HCL 5 MG PO TABS
5.0000 mg | ORAL_TABLET | ORAL | Status: DC | PRN
  Administered 2013-12-09 – 2013-12-10 (×3): 10 mg via ORAL
  Filled 2013-12-09 (×3): qty 2

## 2013-12-09 NOTE — Progress Notes (Signed)
[place], MD, [address] [place], [place] [address] Phone: [phone] FAX: [place] [phone] August 05, 1955  CARE TEAM:  PCP: Tivis Ringer, MD  Outpatient Care Team: Patient Care Team: Tivis Ringer, MD as PCP - General  Inpatient Treatment Team: Treatment Team: Attending Provider: Stark Klein, MD; Registered Nurse: Colin Broach, RN; Registered Nurse: Candida Peeling, RN; Technician: Thomas Hoff, NT; Registered Nurse: Donzetta Kohut, RN; Technician: Jeanene Erb, NT   Subjective:  "I feel like shit" Shoulders very sore - pain medicines helping Walking in hallways Some nausea  Objective:  Vital signs:  Filed Vitals:   12/08/13 1400 12/08/13 1603 12/08/13 2120 12/09/13 0549  BP: 109/66 118/65 114/57 130/60  Pulse: 65 74 68 73  Temp: 98.1 F (36.7 C) 97.5 F (36.4 C) 98.8 F (37.1 C) 100 F (37.8 C)  TempSrc: Oral Oral Oral Oral  Resp: '14 16 17 16  ' Height:      Weight:      SpO2: 96% 96% 96% 95%    Last BM Date: 12/07/13  Intake/Output   Yesterday:  05/08 0701 - 05/09 0700 In: 3213.7 [P.O.:820; I.V.:2393.7] Out: 1250 [Urine:1150; Drains:100] This shift:     Bowel function:  Flatus: scant  BM: no  Drain: serosanguinous  Physical Exam:  General: Pt awake/alert/oriented x4 in no acute distress Eyes: PERRL, normal EOM.  Sclera clear.  No icterus Neuro: CN II-XII intact w/o focal sensory/motor deficits. Lymph: No head/neck/groin lymphadenopathy Psych:  No delerium/psychosis/paranoia.  Tearful but consolable HENT: Normocephalic, Mucus membranes moist.  No thrush Neck: Supple, No tracheal deviation Chest: No chest wall pain w good excursion CV:  Pulses intact.  Regular rhythm MS: Normal AROM mjr joints.  No obvious deformity.  Shoulders sore bil but ROM OK Abdomen: Soft.  Nondistended.  Mildly tender at incisions only.   No evidence of peritonitis.  No incarcerated hernias. Ext:  SCDs BLE.  No mjr edema.  No cyanosis Skin: No petechiae / purpura   Problem List:   Active Problems:   Pancreatic lesion   Assessment  [name]  58 y.o. 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  Coping fairly  Plan:  -improve pain control -clears - so slowly -gentle bowel regimen -anxiolysis -f/u path -VTE prophylaxis- SCDs, etc -mobilize as tolerated to help recovery  I updated the patient's status to the patient.  Recommendations were made.  Questions were answered.  The patient expressed understanding & appreciation.   Adin Hector, M.D., F.A.C.S. Gastrointestinal and Minimally Invasive Surgery [facility] [address] [place], [place] [address] [phone] Main / Paging   12/09/2013   Results:   Labs: Results for orders placed during the hospital encounter of 12/07/13 (from the past 48 hour(s))  CBC     Status: Abnormal   Collection Time    12/07/13  8:35 PM      Result Value Ref Range   WBC 11.4 (*) 4.0 - 10.5 K/uL   RBC 4.19  3.87 - 5.11 MIL/uL   Hemoglobin 12.4  12.0 - 15.0 g/dL   HCT 37.3  36.0 - 46.0 %   MCV 89.0  78.0 - 100.0 fL   MCH 29.6  26.0 - 34.0 pg   MCHC 33.2  30.0 - 36.0 g/dL   RDW 13.3  11.5 - 15.5 %   Platelets 259  150 - 400 K/uL  CREATININE, SERUM     Status: None   Collection Time    12/07/13  8:35 PM      Result Value Ref Range   Creatinine, Ser 0.76  0.50 - 1.10 mg/dL   GFR calc non Af Amer >90  >90 mL/min   GFR calc Af Amer >90  >90 mL/min   Comment: (NOTE)     The eGFR has been calculated using the CKD EPI equation.     This calculation has not been validated in all clinical situations.     eGFR's persistently <90 mL/min  signify possible Chronic Kidney     Disease.  CBC     Status: Abnormal   Collection Time    12/08/13  8:00 AM      Result Value Ref Range   WBC 8.4  4.0 - 10.5 K/uL   RBC 3.90  3.87 - 5.11 MIL/uL   Hemoglobin 11.5 (*) 12.0 - 15.0 g/dL   HCT 34.6 (*) 36.0 - 46.0 %   MCV 88.7  78.0 - 100.0 fL   MCH 29.5  26.0 - 34.0 pg   MCHC 33.2  30.0 - 36.0 g/dL   RDW 13.4  11.5 - 15.5 %   Platelets 256  150 - 400 K/uL  BASIC METABOLIC PANEL     Status: Abnormal   Collection Time    12/08/13  8:00 AM      Result Value Ref Range   Sodium 137  137 - 147 mEq/L   Potassium 4.2  3.7 - 5.3 mEq/L   Chloride 101  96 - 112 mEq/L   CO2 23  19 - 32 mEq/L   Glucose, Bld 135 (*) 70 - 99 mg/dL   BUN 9  6 - 23 mg/dL   Creatinine, Ser 0.71  0.50 - 1.10 mg/dL   Calcium 8.2 (*) 8.4 - 10.5 mg/dL   GFR calc non Af Amer >90  >90 mL/min   GFR calc Af Amer >90  >90 mL/min   Comment: (NOTE)     The eGFR has been calculated using the CKD EPI equation.     This calculation has not been validated in all clinical situations.     eGFR's persistently <90 mL/min signify possible Chronic Kidney     Disease.  CBC     Status: Abnormal   Collection Time    12/09/13  6:09 AM      Result Value Ref Range   WBC 9.8  4.0 - 10.5 K/uL   RBC 3.92  3.87 - 5.11 MIL/uL   Hemoglobin 11.5 (*) 12.0 - 15.0 g/dL   HCT 35.8 (*) 36.0 - 46.0 %   MCV 91.3  78.0 - 100.0 fL   MCH 29.3  26.0 - 34.0 pg   MCHC 32.1  30.0 - 36.0 g/dL   RDW 13.6  11.5 - 15.5 %   Platelets 254  150 - 400 K/uL  BASIC METABOLIC PANEL     Status: Abnormal   Collection Time    12/09/13  6:09 AM      Result Value Ref Range   Sodium 135 (*) 137 - 147 mEq/L   Potassium 3.8  3.7 - 5.3 mEq/L   Chloride 101  96 - 112 mEq/L   CO2 25  19 - 32 mEq/L  Glucose, Bld 118 (*) 70 - 99 mg/dL   BUN 4 (*) 6 - 23 mg/dL   Creatinine, Ser 0.67  0.50 - 1.10 mg/dL   Calcium 8.3 (*) 8.4 - 10.5 mg/dL   GFR calc non Af Amer >90  >90 mL/min   GFR calc Af Amer >90  >90 mL/min    Comment: (NOTE)     The eGFR has been calculated using the CKD EPI equation.     This calculation has not been validated in all clinical situations.     eGFR's persistently <90 mL/min signify possible Chronic Kidney     Disease.    Imaging / Studies: No results found.  Medications / Allergies: per chart  Antibiotics: Anti-infectives   Start     Dose/Rate Route Frequency Ordered Stop   12/07/13 1915  ceFAZolin (ANCEF) IVPB 1 g/50 mL premix     1 g 100 mL/hr over 30 Minutes Intravenous Every 6 hours 12/07/13 1908 12/08/13 0814   12/07/13 0600  ceFAZolin (ANCEF) IVPB 2 g/50 mL premix     2 g 100 mL/hr over 30 Minutes Intravenous On call to O.R. 12/06/13 1404 12/07/13 1419       Note: This dictation was prepared with Dragon/digital dictation along with [employer]. Any transcriptional errors that result from this process are unintentional.

## 2013-12-10 LAB — BASIC METABOLIC PANEL
BUN: 3 mg/dL — ABNORMAL LOW (ref 6–23)
CO2: 26 mEq/L (ref 19–32)
Calcium: 8.4 mg/dL (ref 8.4–10.5)
Chloride: 99 mEq/L (ref 96–112)
Creatinine, Ser: 0.69 mg/dL (ref 0.50–1.10)
GFR calc Af Amer: >90 mL/min (ref >90)
GFR calc non Af Amer: >90 mL/min (ref >90)
Glucose, Bld: 111 mg/dL — ABNORMAL HIGH (ref 70–99)
Potassium: 3.6 mEq/L — ABNORMAL LOW (ref 3.7–5.3)
Sodium: 134 mEq/L — ABNORMAL LOW (ref 137–147)

## 2013-12-10 LAB — CBC
HCT: 33.9 % — ABNORMAL LOW (ref 36.0–46.0)
Hemoglobin: 10.9 g/dL — ABNORMAL LOW (ref 12.0–15.0)
MCH: 29.2 pg (ref 26.0–34.0)
MCHC: 32.2 g/dL (ref 30.0–36.0)
MCV: 90.9 fL (ref 78.0–100.0)
Platelets: 230 10*3/uL (ref 150–400)
RBC: 3.73 MIL/uL — ABNORMAL LOW (ref 3.87–5.11)
RDW: 13.4 % (ref 11.5–15.5)
WBC: 11 10*3/uL — ABNORMAL HIGH (ref 4.0–10.5)

## 2013-12-10 MED ORDER — ACETAMINOPHEN 500 MG PO TABS
1000.0000 mg | ORAL_TABLET | Freq: Four times a day (QID) | ORAL | Status: DC
  Administered 2013-12-10 – 2013-12-12 (×7): 1000 mg via ORAL
  Filled 2013-12-10 (×14): qty 2

## 2013-12-10 MED ORDER — TRAMADOL HCL 50 MG PO TABS
50.0000 mg | ORAL_TABLET | Freq: Four times a day (QID) | ORAL | Status: DC | PRN
  Administered 2013-12-10 – 2013-12-13 (×5): 100 mg via ORAL
  Filled 2013-12-10 (×5): qty 2

## 2013-12-10 MED ORDER — HYDROMORPHONE HCL PF 1 MG/ML IJ SOLN: 0.5000 mg | INTRAMUSCULAR | Status: DC | PRN

## 2013-12-10 NOTE — Progress Notes (Signed)
[name], MD, [address] [place], [place] [address] Phone: [phone] FAX: [place] [phone] 1955/09/18  CARE TEAM:  PCP: Tivis Ringer, MD  Outpatient Care Team: Patient Care Team: Tivis Ringer, MD as PCP - General  Inpatient Treatment Team: Treatment Team: Attending Provider: Stark Klein, MD; Registered Nurse: Candida Peeling, RN; Technician: Thomas Hoff, NT; Registered Nurse: Donzetta Kohut, RN; Technician: Jeanene Erb, NT; Registered Nurse: Eppie Gibson Pleasant, RN; Registered Nurse: Dagoberto Ligas, RN   Subjective:  Feeling better Shoulders less sore Walking in hallways Tol clears Husband & daughter in room Anxious about awaiting pathology Doesn't like oxycodone  Objective:  Vital signs:  Filed Vitals:   12/09/13 0549 12/09/13 1435 12/09/13 2106 12/10/13 0602  BP: 130/60 131/65 138/67 117/66  Pulse: 73 70 71 71  Temp: 100 F (37.8 C) 99.5 F (37.5 C) 99 F (37.2 C) 99.2 F (37.3 C)  TempSrc: Oral Oral Oral Oral  Resp: '16 16 17 16  ' Height:      Weight:      SpO2: 95% 94% 97% 98%    Last BM Date: 12/07/13 (pre op)  Intake/Output   Yesterday:  05/09 0701 - 05/10 0700 In: -  Out: 3330 [Urine:3200; Drains:130] This shift:  Total I/O In: 300 [P.O.:300] Out: 620 [Urine:600; Drains:20]  Bowel function:  Flatus: scant  BM: no  Drain: serosanguinous  Physical Exam:  General: Pt awake/alert/oriented x4 in no acute distress Eyes: PERRL, normal EOM.  Sclera clear.  No icterus Neuro: CN II-XII intact w/o focal sensory/motor deficits. Lymph: No head/neck/groin lymphadenopathy Psych:  No delerium/psychosis/paranoia.  Tearful but consolable HENT: Normocephalic, Mucus membranes moist.  No thrush Neck: Supple, No tracheal deviation Chest: No chest wall pain w good excursion CV:  Pulses intact.  Regular rhythm MS: Normal AROM  mjr joints.  No obvious deformity.  Shoulders ROM OK Abdomen: Soft.  Nondistended.  Mildly tender at incisions only.  No evidence of peritonitis.  No incarcerated hernias. Ext:  SCDs BLE.  No mjr edema.  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   Plan:  -improve pain control.  Inc tylenol QID.  Change tramadol -fulls -gentle bowel regimen -anxiolysis -f/u path -VTE prophylaxis- SCDs, etc -mobilize as tolerated to help recovery  I updated the patient's status to the patient & family [id number].  Recommendations were made.  Questions were answered.  The patient expressed understanding & appreciation.   SAdin Hector M.D., F.A.C.S. Gastrointestinal and Minimally Invasive Surgery [facility] [address] [place] [phone] / Paging   12/10/2013   Results:   Labs: Results for orders placed during the hospital encounter of 12/07/13 (from the past 48 hour(s))  CBC     Status: Abnormal   Collection Time    12/09/13  6:09 AM      Result Value Ref Range   WBC 9.8  4.0 - 10.5 K/uL   RBC 3.92  3.87 - 5.11 MIL/uL   Hemoglobin  11.5 (*) 12.0 - 15.0 g/dL   HCT 35.8 (*) 36.0 - 46.0 %   MCV 91.3  78.0 - 100.0 fL   MCH 29.3  26.0 - 34.0 pg   MCHC 32.1  30.0 - 36.0 g/dL   RDW 13.6  11.5 - 15.5 %   Platelets 254  150 - 400 K/uL  BASIC METABOLIC PANEL     Status: Abnormal   Collection Time    12/09/13  6:09 AM      Result Value Ref Range   Sodium 135 (*) 137 - 147 mEq/L   Potassium 3.8  3.7 - 5.3 mEq/L   Chloride 101   96 - 112 mEq/L   CO2 25  19 - 32 mEq/L   Glucose, Bld 118 (*) 70 - 99 mg/dL   BUN 4 (*) 6 - 23 mg/dL   Creatinine, Ser 0.67  0.50 - 1.10 mg/dL   Calcium 8.3 (*) 8.4 - 10.5 mg/dL   GFR calc non Af Amer >90  >90 mL/min   GFR calc Af Amer >90  >90 mL/min   Comment: (NOTE)     The eGFR has been calculated using the CKD EPI equation.     This calculation has not been validated in all clinical situations.     eGFR's persistently <90 mL/min signify possible Chronic Kidney     Disease.  CBC     Status: Abnormal   Collection Time    12/10/13  3:00 AM      Result Value Ref Range   WBC 11.0 (*) 4.0 - 10.5 K/uL   RBC 3.73 (*) 3.87 - 5.11 MIL/uL   Hemoglobin 10.9 (*) 12.0 - 15.0 g/dL   HCT 33.9 (*) 36.0 - 46.0 %   MCV 90.9  78.0 - 100.0 fL   MCH 29.2  26.0 - 34.0 pg   MCHC 32.2  30.0 - 36.0 g/dL   RDW 13.4  11.5 - 15.5 %   Platelets 230  150 - 400 K/uL  BASIC METABOLIC PANEL     Status: Abnormal   Collection Time    12/10/13  3:00 AM      Result Value Ref Range   Sodium 134 (*) 137 - 147 mEq/L   Potassium 3.6 (*) 3.7 - 5.3 mEq/L   Chloride 99  96 - 112 mEq/L   CO2 26  19 - 32 mEq/L   Glucose, Bld 111 (*) 70 - 99 mg/dL   BUN 3 (*) 6 - 23 mg/dL   Creatinine, Ser 0.69  0.50 - 1.10 mg/dL   Calcium 8.4  8.4 - 10.5 mg/dL   GFR calc non Af Amer >90  >90 mL/min   GFR calc Af Amer >90  >90 mL/min   Comment: (NOTE)     The eGFR has been calculated using the CKD EPI equation.     This calculation has not been validated in all clinical situations.     eGFR's persistently <90 mL/min signify possible Chronic Kidney     Disease.    Imaging / Studies: No results found.  Medications / Allergies: per chart  Antibiotics: Anti-infectives   Start     Dose/Rate Route Frequency Ordered Stop   12/07/13 1915  ceFAZolin (ANCEF) IVPB 1 g/50 mL premix     1 g 100 mL/hr over 30 Minutes Intravenous Every 6 hours 12/07/13 1908 12/08/13 0814   12/07/13 0600  ceFAZolin (ANCEF) IVPB 2 g/50 mL premix      2 g 100 mL/hr  over 30 Minutes Intravenous On call to O.R. 12/06/13 1404 12/07/13 1419       Note: This dictation was prepared with Dragon/digital dictation along with Smartphrase technology. Any transcriptional errors that result from this process are unintentional.

## 2013-12-11 LAB — BASIC METABOLIC PANEL
BUN: 5 mg/dL — ABNORMAL LOW (ref 6–23)
CO2: 26 mEq/L (ref 19–32)
Calcium: 8.8 mg/dL (ref 8.4–10.5)
Chloride: 100 mEq/L (ref 96–112)
Creatinine, Ser: 0.69 mg/dL (ref 0.50–1.10)
GFR calc Af Amer: >90 mL/min (ref >90)
GFR calc non Af Amer: >90 mL/min (ref >90)
Glucose, Bld: 107 mg/dL — ABNORMAL HIGH (ref 70–99)
Potassium: 3.9 mEq/L (ref 3.7–5.3)
Sodium: 138 mEq/L (ref 137–147)

## 2013-12-11 LAB — CBC
HCT: 33.5 % — ABNORMAL LOW (ref 36.0–46.0)
Hemoglobin: 10.9 g/dL — ABNORMAL LOW (ref 12.0–15.0)
MCH: 29 pg (ref 26.0–34.0)
MCHC: 32.5 g/dL (ref 30.0–36.0)
MCV: 89.1 fL (ref 78.0–100.0)
Platelets: 251 10*3/uL (ref 150–400)
RBC: 3.76 MIL/uL — ABNORMAL LOW (ref 3.87–5.11)
RDW: 13.2 % (ref 11.5–15.5)
WBC: 8.7 10*3/uL (ref 4.0–10.5)

## 2013-12-11 NOTE — Progress Notes (Signed)
Patient ID: [name], female   DOB: November 27, 1955, 58 y.o.   MRN: [id number]  [facility]  [address] [place],  [address] Phone: [phone] FAX: [place] [phone] [birth year]  CARE TEAM:  PCP: Tivis Ringer, MD  Outpatient Care Team: Patient Care Team: Tivis Ringer, MD as PCP - General  Inpatient Treatment Team: Treatment Team: Attending Provider: Stark Klein, MD; Registered Nurse: Candida Peeling, RN; Technician: Thomas Hoff, NT; Registered Nurse: Donzetta Kohut, RN; Technician: Jeanene Erb, NT; Registered Nurse: Eppie Gibson Pleasant, RN; Registered Nurse: Dagoberto Ligas, RN   Subjective: Feeling nauseated this AM.  Pain controlled well.   Objective:  Vital signs:  Filed Vitals:   12/10/13 0602 12/10/13 1500 12/10/13 2139 12/11/13 0540  BP: 117/66 114/60 107/63 117/68  Pulse: 71 62 77 65  Temp: 99.2 F (37.3 C) 97.8 F (36.6 C) 98.4 F (36.9 C) 98.2 F (36.8 C)  TempSrc: Oral Oral Oral Oral  Resp: '16 16 18 16  ' Height:      Weight:      SpO2: 98% 95% 95% 99%    Last BM Date: 12/07/13  Intake/Output   Yesterday:  05/10 0701 - 05/11 0700 In: 540 [P.O.:540] Out: 1650 [Urine:1600; Drains:50] This shift:  Total I/O In: -  Out: 82 [Urine:900; Drains:25]  Bowel function:  Flatus: scant  BM: no  Drain: serosanguinous  Physical Exam:  General: Pt awake/alert/oriented x4 in no acute distress Eyes: PERRL, normal EOM.  Sclera clear.  No icterus Psych:  No delerium/psychosis/paranoia.  Sl anxious, but OK.   HENT: Normocephalic, Mucus membranes moist.  No thrush Chest: No chest wall pain w good excursion CV:  Pulses intact.  Regular rhythm Abdomen: Soft.  Nondistended.  Mildly tender at incisions only.  No evidence of peritonitis.  No incarcerated hernias. Ext:  SCDs BLE.  No mjr edema.  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 female  4 Days Post-Op  Procedure(s): PREOPERATIVE DIAGNOSIS: Suspected IPMN pancreas  POSTOPERATIVE DIAGNOSIS: Same  PROCEDURE: Diagnostic laparoscopy, laparoscopic distal  pancreatectomy  SURGEON: Stark Klein, MD   Recovering better.   Plan:  Criss Rosales diet as tolerated.  Await pathology. Blood sugars OK.   Ambulate     12/11/2013   Results:   Labs: Results for orders placed during the hospital encounter of 12/07/13 (from the past 48 hour(s))  CBC     Status: Abnormal   Collection Time    12/10/13  3:00 AM      Result Value Ref Range   WBC 11.0 (*) 4.0 - 10.5 K/uL   RBC 3.73 (*) 3.87 - 5.11 MIL/uL   Hemoglobin 10.9 (*) 12.0 - 15.0 g/dL   HCT 33.9 (*) 36.0 - 46.0 %   MCV 90.9  78.0 - 100.0 fL   MCH 29.2  26.0 - 34.0 pg   MCHC 32.2  30.0 - 36.0 g/dL   RDW 13.4  11.5 - 15.5 %   Platelets 230  150 - 400 K/uL  BASIC METABOLIC PANEL     Status: Abnormal   Collection Time    12/10/13  3:00 AM      Result Value Ref Range   Sodium 134 (*) 137 - 147 mEq/L   Potassium 3.6 (*)  3.7 - 5.3 mEq/L   Chloride 99  96 - 112 mEq/L   CO2 26  19 - 32 mEq/L   Glucose, Bld 111 (*) 70 - 99 mg/dL   BUN 3 (*) 6 - 23 mg/dL   Creatinine, Ser 0.69  0.50 - 1.10 mg/dL   Calcium 8.4  8.4 - 10.5 mg/dL   GFR calc non Af Amer >90  >90 mL/min   GFR calc Af Amer >90  >90 mL/min   Comment: (NOTE)     The eGFR has been calculated using the CKD EPI equation.     This calculation has not been validated in all clinical situations.     eGFR's persistently <90 mL/min signify possible Chronic Kidney     Disease.  CBC     Status: Abnormal   Collection Time    12/11/13  6:10 AM      Result Value Ref Range   WBC 8.7  4.0 - 10.5 K/uL   RBC 3.76 (*) 3.87 - 5.11 MIL/uL   Hemoglobin 10.9 (*) 12.0 - 15.0 g/dL   HCT 33.5 (*) 36.0 - 46.0 %   MCV 89.1  78.0 -  100.0 fL   MCH 29.0  26.0 - 34.0 pg   MCHC 32.5  30.0 - 36.0 g/dL   RDW 13.2  11.5 - 15.5 %   Platelets 251  150 - 400 K/uL  BASIC METABOLIC PANEL     Status: Abnormal   Collection Time    12/11/13  6:10 AM      Result Value Ref Range   Sodium 138  137 - 147 mEq/L   Potassium 3.9  3.7 - 5.3 mEq/L   Chloride 100  96 - 112 mEq/L   CO2 26  19 - 32 mEq/L   Glucose, Bld 107 (*) 70 - 99 mg/dL   BUN 5 (*) 6 - 23 mg/dL   Creatinine, Ser 0.69  0.50 - 1.10 mg/dL   Calcium 8.8  8.4 - 10.5 mg/dL   GFR calc non Af Amer >90  >90 mL/min   GFR calc Af Amer >90  >90 mL/min   Comment: (NOTE)     The eGFR has been calculated using the CKD EPI equation.     This calculation has not been validated in all clinical situations.     eGFR's persistently <90 mL/min signify possible Chronic Kidney     Disease.    Imaging / Studies: No results found.  Medications / Allergies: per chart  Antibiotics: Anti-infectives   Start     Dose/Rate Route Frequency Ordered Stop   12/07/13 1915  ceFAZolin (ANCEF) IVPB 1 g/50 mL premix     1 g 100 mL/hr over 30 Minutes Intravenous Every 6 hours 12/07/13 1908 12/08/13 0814   12/07/13 0600  ceFAZolin (ANCEF) IVPB 2 g/50 mL premix     2 g 100 mL/hr over 30 Minutes Intravenous On call to O.R. 12/06/13 1404 12/07/13 1419       Note: This dictation was prepared with Dragon/digital dictation along with [employer]. Any transcriptional errors that result from this process are unintentional.

## 2013-12-12 LAB — CBC
HCT: 33.8 % — ABNORMAL LOW (ref 36.0–46.0)
Hemoglobin: 11.1 g/dL — ABNORMAL LOW (ref 12.0–15.0)
MCH: 29.1 pg (ref 26.0–34.0)
MCHC: 32.8 g/dL (ref 30.0–36.0)
MCV: 88.5 fL (ref 78.0–100.0)
Platelets: 303 10*3/uL (ref 150–400)
RBC: 3.82 MIL/uL — ABNORMAL LOW (ref 3.87–5.11)
RDW: 13.1 % (ref 11.5–15.5)
WBC: 8.7 10*3/uL (ref 4.0–10.5)

## 2013-12-12 LAB — BASIC METABOLIC PANEL
BUN: 6 mg/dL (ref 6–23)
CO2: 25 mEq/L (ref 19–32)
Calcium: 8.7 mg/dL (ref 8.4–10.5)
Chloride: 102 mEq/L (ref 96–112)
Creatinine, Ser: 0.75 mg/dL (ref 0.50–1.10)
GFR calc Af Amer: >90 mL/min (ref >90)
GFR calc non Af Amer: >90 mL/min (ref >90)
Glucose, Bld: 102 mg/dL — ABNORMAL HIGH (ref 70–99)
Potassium: 4.2 mEq/L (ref 3.7–5.3)
Sodium: 139 mEq/L (ref 137–147)

## 2013-12-12 MED ORDER — METOCLOPRAMIDE HCL 5 MG PO TABS
5.0000 mg | ORAL_TABLET | Freq: Three times a day (TID) | ORAL | Status: DC
  Administered 2013-12-12 – 2013-12-13 (×3): 5 mg via ORAL
  Filled 2013-12-12 (×6): qty 1

## 2013-12-12 MED ORDER — PROMETHAZINE HCL 25 MG/ML IJ SOLN: 6.2500 mg | Freq: Four times a day (QID) | INTRAMUSCULAR | Status: DC | PRN

## 2013-12-12 MED ORDER — PROMETHAZINE HCL 25 MG PO TABS: 25.0000 mg | ORAL_TABLET | Freq: Four times a day (QID) | ORAL | Status: DC | PRN

## 2013-12-12 MED ORDER — BISACODYL 10 MG RE SUPP: 10.0000 mg | Freq: Every day | RECTAL | Status: DC | PRN

## 2013-12-12 MED ORDER — PROMETHAZINE HCL 25 MG PO TABS: 12.5000 mg | ORAL_TABLET | Freq: Four times a day (QID) | ORAL | Status: DC | PRN

## 2013-12-12 MED ORDER — HYDROCODONE-ACETAMINOPHEN 5-325 MG PO TABS
1.0000 | ORAL_TABLET | ORAL | Status: DC | PRN
  Administered 2013-12-12 (×2): 2 via ORAL
  Filled 2013-12-12 (×2): qty 2

## 2013-12-12 NOTE — Progress Notes (Signed)
Came to see patient on behalf of [facility] to [facility] Management services for [employer] employees/dependents with [facility] insurance. Patient reports she is pretty on top of everything and does not think she needs a post hospital discharge call. Denies having any current [employer] [facility] needs. Left contact information and brochure at bedside for her to contact in the future if needed. Appreciative of visit.  Marthenia Rolling, MSN-RN,BSN- [facility] [phone]

## 2013-12-12 NOTE — Progress Notes (Signed)
Patient ID: [name], female   DOB: 10-30-1955, 58 y.o.   MRN: [id number]  [facility]  [address] [place], [place] [address] Phone: [phone] FAX: [phone]    [name] [id number] 01/18/1956  CARE TEAM:  PCP: Tivis Ringer, MD  Outpatient Care Team: Patient Care Team: Tivis Ringer, MD as PCP - General  Inpatient Treatment Team: Treatment Team: Attending Provider: Stark Klein, MD; Registered Nurse: Candida Peeling, RN; Technician: Jeanene Erb, NT; Registered Nurse: Eppie Gibson Pleasant, RN; Registered Nurse: Dagoberto Ligas, RN; Technician: Estella Husk, NT; Technician: Army Chaco, NT   Subjective: Still feels a bit nauseated.  Keeping down liquids/crackers without emesis.  Still having flatus.  Has had BM.    Objective:  Vital signs:  Filed Vitals:   12/11/13 0540 12/11/13 1500 12/11/13 2042 12/12/13 0625  BP: 117/68 97/47 102/60 131/70  Pulse: 65 76 72 58  Temp: 98.2 F (36.8 C) 98.7 F (37.1 C) 98.6 F (37 C) 98.1 F (36.7 C)  TempSrc: Oral Oral Oral Oral  Resp: '16 16 16 17  ' Height:      Weight:      SpO2: 99% 96% 98% 97%    Last BM Date: 12/07/13  Intake/Output   Yesterday:  05/11 0701 - 05/12 0700 In: 1210 [P.O.:1100] Out: 2050 [Urine:2000; Drains:50] This shift:     Physical Exam:  General: Pt awake/alert/oriented x4 in no acute distress  HENT: Normocephalic, Mucus membranes moist.  No thrush Chest: No chest wall pain w good excursion Abdomen: Soft.  Nondistended.  Mildly tender at incisions only.  No evidence of peritonitis.  No incarcerated hernias. Drain serosang.   Ext:  SCDs BLE.  No mjr edema.  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 female  5 Days Post-Op  Procedure(s): PREOPERATIVE  DIAGNOSIS: Suspected IPMN pancreas  POSTOPERATIVE DIAGNOSIS: Same  PROCEDURE: Diagnostic laparoscopy, laparoscopic distal  pancreatectomy  SURGEON: Stark Klein, MD   Sl improvement.    Plan:  Add reglan. Await pathology. Decrease phenergan.   Blood sugars OK.   Ambulate     12/12/2013   Results:   Labs: Results for orders placed during the hospital encounter of 12/07/13 (from the past 48 hour(s))  CBC     Status: Abnormal   Collection Time    12/11/13  6:10 AM      Result Value Ref Range   WBC 8.7  4.0 - 10.5 K/uL   RBC 3.76 (*) 3.87 - 5.11 MIL/uL   Hemoglobin 10.9 (*) 12.0 - 15.0 g/dL   HCT 33.5 (*) 36.0 - 46.0 %   MCV 89.1  78.0 - 100.0 fL   MCH 29.0  26.0 - 34.0 pg   MCHC 32.5  30.0 - 36.0 g/dL   RDW 13.2  11.5 - 15.5 %   Platelets 251  150 - 400 K/uL  BASIC METABOLIC PANEL     Status: Abnormal   Collection Time    12/11/13  6:10 AM      Result Value Ref Range   Sodium 138  137 - 147 mEq/L   Potassium 3.9  3.7 - 5.3 mEq/L   Chloride 100  96 - 112 mEq/L   CO2 26  19 - 32 mEq/L   Glucose, Bld 107 (*)  70 - 99 mg/dL   BUN 5 (*) 6 - 23 mg/dL   Creatinine, Ser 0.69  0.50 - 1.10 mg/dL   Calcium 8.8  8.4 - 10.5 mg/dL   GFR calc non Af Amer >90  >90 mL/min   GFR calc Af Amer >90  >90 mL/min   Comment: (NOTE)     The eGFR has been calculated using the CKD EPI equation.     This calculation has not been validated in all clinical situations.     eGFR's persistently <90 mL/min signify possible Chronic Kidney     Disease.  CBC     Status: Abnormal   Collection Time    12/12/13  3:42 AM      Result Value Ref Range   WBC 8.7  4.0 - 10.5 K/uL   RBC 3.82 (*) 3.87 - 5.11 MIL/uL   Hemoglobin 11.1 (*) 12.0 - 15.0 g/dL   HCT 33.8 (*) 36.0 - 46.0 %   MCV 88.5  78.0 - 100.0 fL   MCH 29.1  26.0 - 34.0 pg   MCHC 32.8  30.0 - 36.0 g/dL   RDW 13.1  11.5 - 15.5 %   Platelets 303  150 - 400 K/uL  BASIC METABOLIC PANEL     Status: Abnormal   Collection Time    12/12/13   3:42 AM      Result Value Ref Range   Sodium 139  137 - 147 mEq/L   Potassium 4.2  3.7 - 5.3 mEq/L   Chloride 102  96 - 112 mEq/L   CO2 25  19 - 32 mEq/L   Glucose, Bld 102 (*) 70 - 99 mg/dL   BUN 6  6 - 23 mg/dL   Creatinine, Ser 0.75  0.50 - 1.10 mg/dL   Calcium 8.7  8.4 - 10.5 mg/dL   GFR calc non Af Amer >90  >90 mL/min   GFR calc Af Amer >90  >90 mL/min   Comment: (NOTE)     The eGFR has been calculated using the CKD EPI equation.     This calculation has not been validated in all clinical situations.     eGFR's persistently <90 mL/min signify possible Chronic Kidney     Disease.    Imaging / Studies: No results found.  Medications / Allergies: per chart  Antibiotics: Anti-infectives   Start     Dose/Rate Route Frequency Ordered Stop   12/07/13 1915  ceFAZolin (ANCEF) IVPB 1 g/50 mL premix     1 g 100 mL/hr over 30 Minutes Intravenous Every 6 hours 12/07/13 1908 12/08/13 0814   12/07/13 0600  ceFAZolin (ANCEF) IVPB 2 g/50 mL premix     2 g 100 mL/hr over 30 Minutes Intravenous On call to O.R. 12/06/13 1404 12/07/13 1419       Note: This dictation was prepared with Dragon/digital dictation along with [employer]. Any transcriptional errors that result from this process are unintentional.

## 2013-12-13 LAB — BASIC METABOLIC PANEL
BUN: 6 mg/dL (ref 6–23)
CO2: 26 mEq/L (ref 19–32)
Calcium: 8.5 mg/dL (ref 8.4–10.5)
Chloride: 103 mEq/L (ref 96–112)
Creatinine, Ser: 0.81 mg/dL (ref 0.50–1.10)
GFR calc Af Amer: >90 mL/min (ref >90)
GFR calc non Af Amer: 80 mL/min — ABNORMAL LOW (ref >90)
Glucose, Bld: 114 mg/dL — ABNORMAL HIGH (ref 70–99)
Potassium: 4.3 mEq/L (ref 3.7–5.3)
Sodium: 140 mEq/L (ref 137–147)

## 2013-12-13 LAB — CBC
HCT: 32.7 % — ABNORMAL LOW (ref 36.0–46.0)
Hemoglobin: 10.8 g/dL — ABNORMAL LOW (ref 12.0–15.0)
MCH: 29.1 pg (ref 26.0–34.0)
MCHC: 33 g/dL (ref 30.0–36.0)
MCV: 88.1 fL (ref 78.0–100.0)
Platelets: 335 10*3/uL (ref 150–400)
RBC: 3.71 MIL/uL — ABNORMAL LOW (ref 3.87–5.11)
RDW: 13.2 % (ref 11.5–15.5)
WBC: 10 10*3/uL (ref 4.0–10.5)

## 2013-12-13 MED ORDER — ACETAMINOPHEN 500 MG PO TABS: 1000.0000 mg | ORAL_TABLET | Freq: Four times a day (QID) | ORAL | Status: DC | PRN

## 2013-12-13 MED ORDER — PROMETHAZINE HCL 12.5 MG PO TABS: 12.5000 mg | ORAL_TABLET | Freq: Four times a day (QID) | ORAL | Status: DC | PRN

## 2013-12-13 MED ORDER — TRAMADOL HCL 50 MG PO TABS: 50.0000 mg | ORAL_TABLET | Freq: Four times a day (QID) | ORAL | Status: DC | PRN

## 2013-12-13 MED ORDER — METOCLOPRAMIDE HCL 5 MG PO TABS: 5.0000 mg | ORAL_TABLET | Freq: Three times a day (TID) | ORAL | Status: DC

## 2013-12-13 MED ORDER — HYDROCODONE-ACETAMINOPHEN 5-325 MG PO TABS: 1.0000 | ORAL_TABLET | ORAL | Status: DC | PRN

## 2013-12-13 NOTE — Progress Notes (Addendum)
Discharge instructions reviewed with patient. Patient able to answer questions re: s/s of wound infection, f/u appointment and pain management. Printed AVS and prescriptions given to patient. Patient discharged to home via wheelchair. Accompanied by husband.

## 2013-12-13 NOTE — Discharge Instructions (Signed)
[facility]      [facility] [phone]  ABDOMINAL SURGERY: POST OP INSTRUCTIONS  Always review your discharge instruction sheet given to you by the facility where your surgery was performed.  IF YOU HAVE DISABILITY OR FAMILY LEAVE FORMS, YOU MUST BRING THEM TO THE OFFICE FOR PROCESSING.  PLEASE DO NOT GIVE THEM TO YOUR DOCTOR.  1. A prescription for pain medication may be given to you upon discharge.  Take your pain medication as prescribed, if needed.  If narcotic pain medicine is not needed, then you may take acetaminophen (Tylenol) or ibuprofen (Advil) as needed. 2. Take your usually prescribed medications unless otherwise directed. 3. If you need a refill on your pain medication, please contact your pharmacy. They will contact our office to request authorization.  Prescriptions will not be filled after 5pm or on week-ends. 4. You should follow a light diet the first few days after arrival home, such as soup and crackers, pudding, etc.unless your doctor has advised otherwise. A high-fiber, low fat diet can be resumed as tolerated.   Be sure to include lots of fluids daily. Most patients will experience some swelling and bruising on the chest and neck area.  Ice packs will help.  Swelling and bruising can take several days to resolve 5. Most patients will experience some swelling and bruising in the area of the incision. Ice pack will help. Swelling and bruising can take several days to resolve..  6. It is common to experience some constipation if taking pain medication after surgery.  Increasing fluid intake and taking a stool softener will usually help or prevent this problem from occurring.  A mild laxative (Milk of Magnesia or Miralax) should be taken according to package directions if there are no bowel movements after 48 hours. 7.  You may have steri-strips (small skin tapes) in place directly over the incision.  These strips should be left on the skin for 10-14 days.  If your  surgeon used skin glue on the incision, you may shower in 48 hours.  The glue will flake off over the next 2-3 weeks.  Any sutures or staples will be removed at the office during your follow-up visit. You may find that a light gauze bandage over your incision may keep your staples from being rubbed or pulled. You may shower and replace the bandage daily. 8. ACTIVITIES:  You may resume regular (light) daily activities beginning the next day--such as daily self-care, walking, climbing stairs--gradually increasing activities as tolerated.  You may have sexual intercourse when it is comfortable.  Refrain from any heavy lifting or straining until approved by your doctor. a. You may drive when you no longer are taking prescription pain medication, you can comfortably wear a seatbelt, and you can safely maneuver your car and apply brakes b. Return to Work: __________4 weeks if applicable_________________________ 9. You should see your doctor in the office for a follow-up appointment approximately two weeks after your surgery.  Make sure that you call for this appointment within a day or two after you arrive home to insure a convenient appointment time. OTHER INSTRUCTIONS:  _____________________________________________________________ _____________________________________________________________  WHEN TO CALL YOUR DOCTOR: 1. Fever over 101.0 2. Inability to urinate 3. Nausea and/or vomiting 4. Extreme swelling or bruising 5. Continued bleeding from incision. 6. Increased pain, redness, or drainage from the incision. 7. Difficulty swallowing or breathing 8. Muscle cramping or spasms. 9. Numbness or tingling in hands or feet or around lips.  The clinic staff is   available to answer your questions during regular business hours.  Please don’t hesitate to call and ask to speak to one of the nurses if you have concerns. ° °For further questions, please visit [email or web address] ° ° ° °

## 2013-12-13 NOTE — Discharge Summary (Signed)
Physician Discharge Summary  Patient ID: ZABDI MIS MRN: [id number] DOB/AGE: [birth year] 58 y.o.  Admit date: 12/07/2013 Discharge date: 12/13/2013  Admission Diagnoses: Patient Active Problem List   Diagnosis Date Noted  . 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 Nonspecific (abnormal) findings on radiological and other examination of gastrointestinal tract 11/16/2013  . Abdominal pain 09/18/2013  . Postmenopausal bleeding 09/07/2013  . Nocturnal foot cramps 08/30/2013  . Subacromial bursitis 06/02/2013  . FOOT PAIN, RIGHT 05/06/2010  . HALLUX RIGIDUS, ACQUIRED 05/06/2010  . 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  She had some issues with narcotics, and was transitioned to tramadol and toradol.  She complained of left shoulder pain.  She was able to void after foley removed.  She did develop some nausea, and reglan helped this.  She was ambulating independently.  Her pathology had to be sent off for second opinion and this was very anxiety provoking for her.  She did not have any significant hyperglycemia.  She was discharged to home in stable condition.    Consults: None  Significant Diagnostic Studies: labs: mild anemia  Treatments: surgery: lap distal pancreatectomy  Discharge Exam: Blood pressure 125/66, pulse 70, temperature 98.7 F (37.1 C), temperature source Oral, resp. rate 16, height 5\' 5"  (1.651 m), weight 162 lb 9 oz (73.738 kg), SpO2 97.00%. General appearance: alert, cooperative and no distress Resp: breathing comfortably GI: soft, non-tender; bowel sounds normal; no masses,  no organomegaly  Disposition: 01-Home or Self  Care  Discharge Orders   Future Orders Complete By Expires   Call MD for:  difficulty breathing, headache or visual disturbances  As directed    Call MD for:  persistant nausea and vomiting  As directed    Call MD for:  redness, tenderness, or signs of infection (pain, swelling, redness, odor or green/yellow discharge around incision site)  As directed    Call MD for:  severe uncontrolled pain  As directed    Call MD for:  temperature >100.4  As directed    Diet - low sodium heart healthy  As directed    Increase activity slowly  As directed        Medication List         acetaminophen 500 MG tablet  Commonly known as:  TYLENOL  Take 2 tablets (1,000 mg total) by mouth every 6 (six) hours as needed.     ACIDOPHILUS PO  Take 1 tablet by mouth daily.     ALPRAZolam 0.5 MG tablet  Commonly known as:  XANAX  Take 0.5 mg by mouth daily as needed for anxiety.     ascorbic acid 1000 MG tablet  Commonly known as:  VITAMIN C  Take 1,000 mg by mouth daily.     b complex vitamins tablet  Take 1 tablet by mouth daily.     CALCIUM 600 + D PO  Take 1 tablet by mouth daily.     carboxymethylcellulose 0.5 % Soln  Commonly known as:  REFRESH PLUS  Place 1 drop into both eyes daily as needed (Dry eyes).     DRAMAMINE II 25 MG tablet  Generic drug:  meclizine  Take 37.5 mg by mouth daily. Take  1 and 1/2 tablet daily/     estradiol 0.1 MG/24HR patch  Commonly known as:  VIVELLE-DOT  Place 1 patch onto the skin 2 (two) times a week. Place 0.1 mg patch on abdomen on Wednesday, and Saturday of each week.     estradiol 0.05 MG/24HR patch  Commonly known as:  VIVELLE-DOT  Place 1 patch onto the skin 2 (two) times a week. Place 5.0 mg patch abdomen on Wednesday, and Saturday of each week.     ferrous sulfate 325 (65 FE) MG tablet  Take 325 mg by mouth daily with breakfast.     fexofenadine-pseudoephedrine 60-120 MG per tablet  Commonly known as:  ALLEGRA-D  Take 1 tablet by mouth  daily.     fluticasone 50 MCG/ACT nasal spray  Commonly known as:  FLONASE  Place 1 spray into both nostrils daily.     HYDROcodone-acetaminophen 5-325 MG per tablet  Commonly known as:  NORCO/VICODIN  Take 1-2 tablets by mouth every 4 (four) hours as needed for severe pain.     Magnesium 250 MG Tabs  Take 250 mg by mouth daily.     metoCLOPramide 5 MG tablet  Commonly known as:  REGLAN  Take 1 tablet (5 mg total) by mouth 3 (three) times daily before meals.     montelukast 10 MG tablet  Commonly known as:  SINGULAIR  Take 10 mg by mouth at bedtime.     multivitamin tablet  Take 1 tablet by mouth daily.     promethazine 12.5 MG tablet  Commonly known as:  PHENERGAN  Take 1 tablet (12.5 mg total) by mouth every 6 (six) hours as needed for nausea or vomiting (nausea).     traMADol 50 MG tablet  Commonly known as:  ULTRAM  Take 1-2 tablets (50-100 mg total) by mouth every 6 (six) hours as needed for moderate pain or severe pain.           Follow-up Information   Follow up with [facility], [place]. Schedule an appointment as soon as possible for a visit in 3 weeks.   Specialty:  General Surgery   Contact information:   [address] [place] [address] [phone]       Signed: Stark Klein 12/13/2013, 7:25 AM

## 2013-12-15 ENCOUNTER — Telehealth (INDEPENDENT_AMBULATORY_CARE_PROVIDER_SITE_OTHER): Payer: Self-pay

## 2013-12-15 ENCOUNTER — Encounter (HOSPITAL_COMMUNITY): Payer: Self-pay

## 2013-12-15 NOTE — Progress Notes (Signed)
Quick Note:  Please let pt know no cancer! IMPN as we suspected. ______

## 2013-12-15 NOTE — Telephone Encounter (Signed)
Called pt to give her the good news no cancer per Dr Barry Dienes. Pt has a f/u appt on 6/9.

## 2013-12-20 ENCOUNTER — Telehealth (INDEPENDENT_AMBULATORY_CARE_PROVIDER_SITE_OTHER): Payer: Self-pay

## 2013-12-20 NOTE — Telephone Encounter (Signed)
Pt is s/p distal pancreatectomy by Dr. Barry Dienes.  She says since surgery she has had difficulty taking a full, deep breath without discomfort/straining.  She states it is no worse or better since surgery on 12/07/13.  No other complaints.  Dr. Barry Dienes will be made aware and we will call her back.  She is scheduled for her post op appointment on 01/09/14.

## 2013-12-20 NOTE — Telephone Encounter (Signed)
LMOV for pt to call and ask for [facility].

## 2013-12-21 ENCOUNTER — Ambulatory Visit (INDEPENDENT_AMBULATORY_CARE_PROVIDER_SITE_OTHER): Payer: Commercial Managed Care - PPO | Admitting: General Surgery

## 2013-12-21 ENCOUNTER — Ambulatory Visit
Admission: RE | Admit: 2013-12-21 | Discharge: 2013-12-21 | Disposition: A | Discharge status: Home / Self Care | Payer: 59 | Source: Ambulatory Visit | Attending: General Surgery | Admitting: General Surgery

## 2013-12-21 ENCOUNTER — Inpatient Hospital Stay: Admission: RE | Admit: 2013-12-21 | Payer: 59 | Source: Ambulatory Visit

## 2013-12-21 ENCOUNTER — Other Ambulatory Visit (INDEPENDENT_AMBULATORY_CARE_PROVIDER_SITE_OTHER): Payer: Self-pay | Admitting: General Surgery

## 2013-12-21 ENCOUNTER — Encounter (INDEPENDENT_AMBULATORY_CARE_PROVIDER_SITE_OTHER): Payer: Self-pay

## 2013-12-21 ENCOUNTER — Encounter (INDEPENDENT_AMBULATORY_CARE_PROVIDER_SITE_OTHER): Payer: Self-pay | Admitting: General Surgery

## 2013-12-21 VITALS — BP 126/74 | HR 78 | Temp 98.9°F | Resp 16 | Ht 65.0 in | Wt 155.4 lb

## 2013-12-21 DIAGNOSIS — R0602 Shortness of breath: Secondary | ICD-10-CM

## 2013-12-21 DIAGNOSIS — D490 Neoplasm of unspecified behavior of digestive system: Secondary | ICD-10-CM

## 2013-12-21 MED ORDER — IOHEXOL 350 MG/ML SOLN
100.0000 mL | Freq: Once | INTRAVENOUS | Status: AC | PRN
  Administered 2013-12-21: 100 mL via INTRAVENOUS

## 2013-12-21 NOTE — Telephone Encounter (Signed)
i looked at scan.  It does not look like this fluid is infected.  If she is not having fevers or high white count, I would not arrange for perc drain.

## 2013-12-21 NOTE — Progress Notes (Signed)
Subjective:     Patient ID: [name], female   DOB: July 19, 1956, 58 y.o.   MRN: [id number]  HPI  Patient status post arthroscopic distal pancreatectomy By Dr. Barry Dienes. She developed pain in her left upper quadrant and shoulder over the weekend. There was some associated shortness of breath. A CT scan of her chest was ordered by Dr. Dalbert Batman. This revealed a left upper quadrant fluid collection without features consistent with infection. Dr. Barry Dienes has reviewed this study. Review of Systems     Objective:   Physical Exam Abdomen soft, minimal left upper quadrant tenderness, no flank tenderness, incisions all clean dry and intact    Assessment:     Postoperative fluid collection without evidence of abscess    Plan:     OK to return to work for 4 hours daily on May 26. Follow up as scheduled with Dr. Barry Dienes.

## 2013-12-21 NOTE — Patient Instructions (Signed)
Walking is good. You may return to work starting next Tuesday, 4 hours daily. Return to [facility] as scheduled with Dr. Barry Dienes.

## 2013-12-21 NOTE — Telephone Encounter (Signed)
As of this morning we have not heard from Dr. Barry Dienes.  I called the patient to check on her.  She is still symptomatic stating she feels that she cannot take a full, deep breath.  Still having some pain on her left side. No hx of heart or lung disease.  She was a smoker, but stopped in 1988. Will consult with one of Dr. Marlowe Aschoff partners this morning and call her back.  Pt. Agrees with POC.

## 2013-12-21 NOTE — Telephone Encounter (Signed)
Pt given instructions to be at [facility] at 12 noon today for a STAT CT chest/angio with a f/u appt in urgent office at 3:45 p.m.

## 2014-01-01 ENCOUNTER — Encounter (INDEPENDENT_AMBULATORY_CARE_PROVIDER_SITE_OTHER): Payer: Self-pay

## 2014-01-03 ENCOUNTER — Telehealth (INDEPENDENT_AMBULATORY_CARE_PROVIDER_SITE_OTHER): Payer: Self-pay

## 2014-01-03 NOTE — Telephone Encounter (Signed)
Let's get labs.  Get cbc and cmet. tx FB

## 2014-01-03 NOTE — Telephone Encounter (Signed)
Pt s/p arthroscopic distal pancreatectomy on 12/07/13. Pt states that today she started having abdominal pain in the middle of her abdomen right under the breast. Pt denies any other symptoms. Denies fevers,chills,n/v, diarrhea or gas at this time. Pt states that she has not taken anything other than Reglan at this time and wanted to check with Dr Barry Dienes before she took anything. Pt was seen in office on 12/21/13 for abdominal pain, however she states that this has been a different type of pain. Advised pt that I would send Dr Barry Dienes a message and let her know of pts concerns and recommendations. Pt verbalized understanding and agrees with POC.

## 2014-01-03 NOTE — Telephone Encounter (Addendum)
Notified pt we will order labs for tomorrow.  Pt understood and agreed.  Pt told okay to take Tramadol.

## 2014-01-04 ENCOUNTER — Other Ambulatory Visit (INDEPENDENT_AMBULATORY_CARE_PROVIDER_SITE_OTHER): Payer: Self-pay

## 2014-01-04 ENCOUNTER — Other Ambulatory Visit (INDEPENDENT_AMBULATORY_CARE_PROVIDER_SITE_OTHER): Payer: Self-pay | Admitting: General Surgery

## 2014-01-04 DIAGNOSIS — R1013 Epigastric pain: Secondary | ICD-10-CM

## 2014-01-04 LAB — COMPREHENSIVE METABOLIC PANEL
ALT: 12 U/L (ref 0–35)
AST: 18 U/L (ref 0–37)
Albumin: 4.3 g/dL (ref 3.5–5.2)
Alkaline Phosphatase: 53 U/L (ref 39–117)
BUN: 10 mg/dL (ref 6–23)
CO2: 26 mEq/L (ref 19–32)
Calcium: 9.7 mg/dL (ref 8.4–10.5)
Chloride: 101 mEq/L (ref 96–112)
Creat: 0.79 mg/dL (ref 0.50–1.10)
Glucose, Bld: 92 mg/dL (ref 70–99)
Potassium: 4.7 mEq/L (ref 3.5–5.3)
Sodium: 139 mEq/L (ref 135–145)
Total Bilirubin: 0.4 mg/dL (ref 0.2–1.2)
Total Protein: 7.4 g/dL (ref 6.0–8.3)

## 2014-01-04 LAB — CBC
HCT: 39 % (ref 36.0–46.0)
Hemoglobin: 13 g/dL (ref 12.0–15.0)
MCH: 28.6 pg (ref 26.0–34.0)
MCHC: 33.3 g/dL (ref 30.0–36.0)
MCV: 85.7 fL (ref 78.0–100.0)
Platelets: 374 10*3/uL (ref 150–400)
RBC: 4.55 MIL/uL (ref 3.87–5.11)
RDW: 13.7 % (ref 11.5–15.5)
WBC: 7.4 10*3/uL (ref 4.0–10.5)

## 2014-01-08 ENCOUNTER — Telehealth (INDEPENDENT_AMBULATORY_CARE_PROVIDER_SITE_OTHER): Payer: Self-pay

## 2014-01-08 NOTE — Telephone Encounter (Signed)
Notified pt her labs were normal.  She is still c/o sharp pain in her shoulder when she takes a full breath.  She is taking Tramadol.  She stopped taking her antacid because she said she no longer needed it.  Told the patient Dr. Barry Dienes would discuss her symptoms at her appointment on 01/09/14.

## 2014-01-09 ENCOUNTER — Ambulatory Visit: Payer: 59 | Admitting: Family Medicine

## 2014-01-09 ENCOUNTER — Ambulatory Visit (INDEPENDENT_AMBULATORY_CARE_PROVIDER_SITE_OTHER): Payer: Commercial Managed Care - PPO | Admitting: General Surgery

## 2014-01-09 ENCOUNTER — Encounter (INDEPENDENT_AMBULATORY_CARE_PROVIDER_SITE_OTHER): Payer: Self-pay | Admitting: General Surgery

## 2014-01-09 ENCOUNTER — Encounter (INDEPENDENT_AMBULATORY_CARE_PROVIDER_SITE_OTHER): Payer: Self-pay

## 2014-01-09 VITALS — BP 120/75 | HR 71 | Temp 98.1°F | Resp 14 | Ht 65.0 in | Wt 154.0 lb

## 2014-01-09 DIAGNOSIS — D490 Neoplasm of unspecified behavior of digestive system: Secondary | ICD-10-CM

## 2014-01-09 MED ORDER — TRAMADOL HCL 50 MG PO TABS: 50.0000 mg | ORAL_TABLET | Freq: Four times a day (QID) | ORAL | Status: DC | PRN

## 2014-01-09 NOTE — Assessment & Plan Note (Signed)
Pt looks great.  Increase activity as tolerated.  Advised to walk every 2 hours or so while on the plane to decrease risk of blood clot.    Follow up in 6 months.

## 2014-01-09 NOTE — Patient Instructions (Signed)
Walk, work on increasing activity.    OK to go to [place] :)  Follow up in 6 months.

## 2014-01-09 NOTE — Progress Notes (Signed)
HISTORY: Pt is around 1 month s/p lap distal pancreatectomy for IMPN.  This was spleen sparing.  She is doing well except for some left shoulder/diaphragmatic pain when she takes a deep breath.  We got a CT to rule out PE.  She had a small amount of post surgical fluid, but no abscess. She denies fever/chills/night sweats.  She is able to do some yard work.     EXAM: General:  Alert and oriented.   Incision:  Healing well.  Some LUQ soreness.     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  Increase activity as tolerated.  Advised to walk every 2 hours or so while on the plane to decrease risk of blood clot.    Follow up in 6 months.        Milus Height, MD Surgical Oncology, [facility]  Tivis Ringer, MD Tivis Ringer, MD

## 2014-01-10 ENCOUNTER — Ambulatory Visit (INDEPENDENT_AMBULATORY_CARE_PROVIDER_SITE_OTHER): Payer: 59 | Admitting: Family Medicine

## 2014-01-10 ENCOUNTER — Encounter: Payer: Self-pay | Admitting: Family Medicine

## 2014-01-10 DIAGNOSIS — IMO0002 Reserved for concepts with insufficient information to code with codable children: Secondary | ICD-10-CM

## 2014-01-10 DIAGNOSIS — M7550 Bursitis of unspecified shoulder: Secondary | ICD-10-CM

## 2014-01-10 DIAGNOSIS — M75100 Unspecified rotator cuff tear or rupture of unspecified shoulder, not specified as traumatic: Secondary | ICD-10-CM

## 2014-01-10 DIAGNOSIS — M25569 Pain in unspecified knee: Secondary | ICD-10-CM | POA: Insufficient documentation

## 2014-01-10 NOTE — Patient Instructions (Signed)
Good to see you  You do look great,  Biking would be great for your knee.  Ice 20 minutes 2 times a day to shoulder and knee.  Love the walking idea for next 2 weeks.  Start a walk-run progression: - Initially start one minute walking than one minute running for 20 mins in the first week,   then 25 mins during the second week, then 30 mins afterwards.  Once you have reached 30 mins: - Run 2 mins, then walk 1 min. -Then run 3 mins, and walk 1 min. -Then run 4 mins, and walk 1 min. -Then run 5 mins, and walk 1 min. -Slowly build up weekly to running 30 mins nonstop.  If painful at any of the steps, back up one step. Planks are ok at 8 weeks. Hold 30 second then relax 30 seconds repeat 5 times 3 times a week.    Come back in 4 weeks.

## 2014-01-10 NOTE — Assessment & Plan Note (Signed)
On the way out the door the patient didn't suggest some lateral knee pain. It is certainly patellofemoral syndrome. Patient was given home exercise program to try first as well as an icing protocol. Patient will then follow up again in 3-4 weeks if continuing to have pain. At that time I would like ultrasound possibly brace and possible intra-articular injection if necessary.

## 2014-01-10 NOTE — Progress Notes (Signed)
[name] [place], [place] [address] Phone: [phone] Subjective:       CC: left shoulder pain.   [id number] [name] is a 58 y.o. female coming in with complaint of left shoulder pain. 8 months ago did see patient for contralateral shoulder pain and she did respond very well to an intra-articular injection. Patient unfortunately has had surgery recently and did have removal of a benign lesion. Patient is recovering well except continues to have left shoulder pain since the surgery. Patient's was being treated under the possibility of having diaphragmatic irritation after abdominal surgery. Patient states the unfortunately it has not gotten better and she has no other followups with her surgeon at this time. Patient is going to slowly increase her amount of activity but finds it difficult when she is having a dull aching sensation in the left shoulder at a constant time. Has a sharp pain with certain movements. Patient states the severity 7/10. Has waking her up at night.     Past medical history, social, surgical and family history all reviewed in electronic medical record.   Review of Systems: No headache, visual changes, nausea, vomiting, diarrhea, constipation, dizziness, abdominal pain, skin rash, fevers, chills, night sweats, weight loss, swollen lymph nodes, body aches, joint swelling, muscle aches, chest pain, shortness of breath, mood changes.   Objective   General: No apparent distress alert and oriented x3 mood and affect normal, dressed appropriately.  HEENT: Pupils equal, extraocular movements intact  Respiratory: Patient's speak in full sentences and does not appear short of breath  Cardiovascular: No lower extremity edema, non tender, no erythema  Skin: Warm dry intact with no signs of infection or rash on extremities or on axial skeleton.  Abdomen: Soft nontender  Neuro: Cranial nerves II through XII are intact,  neurovascularly intact in all extremities with 2+ DTRs and 2+ pulses.  Lymph: No lymphadenopathy of posterior or anterior cervical chain or axillae bilaterally.  Gait normal with good balance and coordination.  MSK:  Non tender with full range of motion and good stability and symmetric strength and tone of elbows, wrist, hip, knee and ankles bilaterally.  Shoulder: left Inspection reveals no abnormalities, atrophy or asymmetry. Palpation is normal with no tenderness over AC joint or bicipital groove. ROM is full in all planes passively. Rotator cuff strength normal throughout. signs of impingement with positive Neer and Hawkin's tests, but negative empty can sign. Speeds and Yergason's tests normal. No labral pathology noted with negative Obrien's, negative clunk and good stability. Normal scapular function observed. No painful arc and no drop arm sign. No apprehension sign  MSK US performed of: left This study was ordered, performed, and interpreted by [name] D.O.  Shoulder:   Supraspinatus:  Appears normal on long and transverse views, Bursal bulge seen with shoulder abduction on impingement view. Infraspinatus:  Appears normal on long and transverse views. Significant increase in Doppler flow Subscapularis:  Appears normal on long and transverse views. Positive bursa Teres Minor:  Appears normal on long and transverse views. AC joint:  Capsule undistended, no geyser sign. Glenohumeral Joint:  Appears normal without effusion. Glenoid Labrum:  Intact without visualized tears. Biceps Tendon:  Appears normal on long and transverse views, no fraying of tendon, tendon located in intertubercular groove, no subluxation with shoulder internal or external rotation.  Impression: Subacromial bursitis  Procedure: Real-time Ultrasound Guided Injection of left glenohumeral joint Device: GE Logiq E  Ultrasound guided injection  is preferred based studies that show increased duration, increased  effect, greater accuracy, decreased procedural pain, increased response rate with ultrasound guided versus blind injection.  Verbal informed consent obtained.  Time-out conducted.  Noted no overlying erythema, induration, or other signs of local infection.  Skin prepped in a sterile fashion.  Local anesthesia: Topical Ethyl chloride.  With sterile technique and under real time ultrasound guidance:  Joint visualized.  23g 1  inch needle inserted posterior approach. Pictures taken for needle placement. Patient did have injection of 2 cc of 1% lidocaine, 2 cc of 0.5% Marcaine, and 1.0 cc of Kenalog 40 mg/dL. Completed without difficulty  Pain immediately resolved suggesting accurate placement of the medication.  Advised to call if fevers/chills, erythema, induration, drainage, or persistent bleeding.  Images permanently stored and available for review in the ultrasound unit.  Impression: Technically successful ultrasound guided injection.    Impression and Recommendations:     This case required medical decision making of moderate complexity.

## 2014-01-10 NOTE — Assessment & Plan Note (Signed)
Patient did have an injection today. Patient did have good resolution of pain immediately. Discussed with patient that there is still a possibility that there is diaphragmatic irritation and we'll take a total of 6-12 weeks to fully resolve. Patient will do icing protocol was given home exercise program. We discussed anti-inflammatories as well as can be beneficial. Patient will follow up again in 3 weeks for further evaluation and treatment.  Spent greater than 25 minutes with patient face-to-face and had greater than 50% of counseling including as described above in assessment and plan.

## 2014-02-16 ENCOUNTER — Ambulatory Visit: Payer: 59 | Admitting: Family Medicine

## 2014-03-14 ENCOUNTER — Other Ambulatory Visit: Payer: Self-pay | Admitting: *Deleted

## 2014-03-14 MED ORDER — KETOPROFEN POWD: Status: DC

## 2014-03-15 ENCOUNTER — Other Ambulatory Visit: Payer: Self-pay | Admitting: *Deleted

## 2014-03-15 MED ORDER — KETOPROFEN POWD: Status: DC

## 2014-03-16 ENCOUNTER — Other Ambulatory Visit: Payer: Self-pay | Admitting: *Deleted

## 2014-03-16 MED ORDER — KETOPROFEN POWD: Status: DC

## 2014-03-27 ENCOUNTER — Encounter: Payer: Self-pay | Admitting: Family Medicine

## 2014-03-27 ENCOUNTER — Ambulatory Visit (INDEPENDENT_AMBULATORY_CARE_PROVIDER_SITE_OTHER): Payer: 59 | Admitting: Family Medicine

## 2014-03-27 ENCOUNTER — Other Ambulatory Visit (INDEPENDENT_AMBULATORY_CARE_PROVIDER_SITE_OTHER): Payer: 59

## 2014-03-27 VITALS — BP 122/80 | HR 62 | Ht 66.0 in | Wt 158.0 lb

## 2014-03-27 DIAGNOSIS — M79671 Pain in right foot: Secondary | ICD-10-CM

## 2014-03-27 DIAGNOSIS — M79609 Pain in unspecified limb: Secondary | ICD-10-CM

## 2014-03-27 DIAGNOSIS — M2020 Hallux rigidus, unspecified foot: Secondary | ICD-10-CM

## 2014-03-27 DIAGNOSIS — M2021 Hallux rigidus, right foot: Secondary | ICD-10-CM

## 2014-03-27 DIAGNOSIS — M84376A Stress fracture, unspecified foot, initial encounter for fracture: Secondary | ICD-10-CM

## 2014-03-27 DIAGNOSIS — M84374A Stress fracture, right foot, initial encounter for fracture: Secondary | ICD-10-CM | POA: Insufficient documentation

## 2014-03-27 NOTE — Patient Instructions (Signed)
Good to see you Ice bath 15 minutes after activity and before bed Wear the carbon fiber plate in shoes No barefoot and no thong sandals No running this week then only 2 times a week for following 2 weeks.  On running shoe skip the first eye.  On biking shoe leave lower velcro loose.  Try pennsaid 2 times daily Stop the indomethacin and other topical.  Vitamin D 4000 IU daily until I see you.  See you in 3 weeks.

## 2014-03-27 NOTE — Assessment & Plan Note (Signed)
Likely cause of the patient's stress reaction. We'll monitor.

## 2014-03-27 NOTE — Assessment & Plan Note (Signed)
Patient does have what appears to be a first MTP stress reaction occurring. Patient has callus formation but no cortical defect noted today on ultrasound. Patient is very tender in this area. 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 We discussed icing and was given another topical anti-inflammatory to try. Patient will avoid running or any jumping for the next 3 weeks. Patient will come back at that time. The patient continues to have pain we'll consider treating the hallux rigidus with injection.

## 2014-03-27 NOTE — Progress Notes (Signed)
  [name] [place], [place] [address] Phone: [phone] Subjective:     CC:  Right foot pain.   [id number] [name] is a 58 y.o. female coming in with complaint of right foot pain. Patient states that this seems to be right over her first toe. Patient states that it has been getting worse over the course last 3 months. Patient has been running about 5-10 miles weekly as well as biking. Patient states that the pain seems to be worse after activity. Patient does have some mild discomfort during but it is more of a numbness than a true pain. Patient states that it unfortunately is increasing as soreness. Patient is seen another provider for this previously and has tried oral anti-inflammatories, and topical anti-inflammatories, as well as custom orthotics with minimal benefit. Patient is a custom orthotics changed recently as well. Patient did not bring in her running shoes today. His and states it hurts more when she cannot extend her toes. Denies any weakness. States that the severity is 7/10. Can wake her up at night.     Past medical history, social, surgical and family history all reviewed in electronic medical record.   Review of Systems: No headache, visual changes, nausea, vomiting, diarrhea, constipation, dizziness, abdominal pain, skin rash, fevers, chills, night sweats, weight loss, swollen lymph nodes, body aches, joint swelling, muscle aches, chest pain, shortness of breath, mood changes.   Objective Blood pressure 122/80, pulse 62, height 5\' 6"  (1.676 m), weight 158 lb (71.668 kg), SpO2 98.00%.  General: No apparent distress alert and oriented x3 mood and affect normal, dressed appropriately.  HEENT: Pupils equal, extraocular movements intact  Respiratory: Patient's speak in full sentences and does not appear short of breath  Cardiovascular: No lower extremity edema, non tender, no erythema  Skin: Warm dry intact with no signs  of infection or rash on extremities or on axial skeleton.  Abdomen: Soft nontender  Neuro: Cranial nerves II through XII are intact, neurovascularly intact in all extremities with 2+ DTRs and 2+ pulses.  Lymph: No lymphadenopathy of posterior or anterior cervical chain or axillae bilaterally.  Gait normal with good balance and coordination.  MSK:  Non tender with full range of motion and good stability and symmetric strength and tone of shoulders, elbows, wrist, hip, knee and ankles bilaterally.  Foot Exam shows patient does have a bunion formation bilaterally greater on the right. Patient does have some mild hammering of the toes on the lateral aspect of the right foot #4 and 5. Patient does have mild collection of the longitudinal arch bilaterally. Mild callus formation on the plantar aspect of the second and third metatarsal heads on the right and third and fourth metatarsal heads on left.  Limited muscular skeletal ultrasound was performed and interpreted by Hulan Saas, M  today.  Limited ultrasound shows the patient does have callus formation noted just proximal to the MP joint of the first toe. No significant cortical defect noted. Increasing Doppler flow noted with mild overlying hypoechoic changes.  Impression: Stress fracture of the first MTP   Impression and Recommendations:     This case required medical decision making of moderate complexity.

## 2014-03-29 ENCOUNTER — Encounter: Payer: Self-pay | Admitting: Family Medicine

## 2014-03-29 ENCOUNTER — Encounter (INDEPENDENT_AMBULATORY_CARE_PROVIDER_SITE_OTHER): Payer: Self-pay | Admitting: General Surgery

## 2014-03-29 MED ORDER — DICLOFENAC SODIUM 2 % TD SOLN: TRANSDERMAL | Status: DC

## 2014-03-30 ENCOUNTER — Ambulatory Visit (INDEPENDENT_AMBULATORY_CARE_PROVIDER_SITE_OTHER): Payer: 59 | Admitting: Family Medicine

## 2014-03-30 ENCOUNTER — Encounter: Payer: Self-pay | Admitting: Family Medicine

## 2014-03-30 VITALS — BP 118/70 | HR 60 | Ht 66.0 in | Wt 158.0 lb

## 2014-03-30 DIAGNOSIS — M84374G Stress fracture, right foot, subsequent encounter for fracture with delayed healing: Secondary | ICD-10-CM

## 2014-03-30 DIAGNOSIS — Z4789 Encounter for other orthopedic aftercare: Secondary | ICD-10-CM

## 2014-03-30 NOTE — Assessment & Plan Note (Signed)
Discussed with patient at great length. We did take time and remained different changes and explained why we would be to make any changes. 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 Patient will continue with the carbon fiber plate. We discussed continuing the icing which has been beneficial as well as a topical anti-inflammatory. Patient will keep followup appointment in 3 weeks for further evaluation and treatment. Patient has any pain in the interim she will call and we'll place patient in a Cam Walker. Patient's severity has moderate to severe in seem to be worsening at this time.  Spent greater than 25 minutes with patient face-to-face and had greater than 50% of counseling including as described above in assessment and plan.

## 2014-03-30 NOTE — Progress Notes (Signed)
  [name] [place], [place] [address] Phone: [phone] Subjective:     CC:  Right foot pain follow up.   [id number] [name] is a 58 y.o. female coming in with complaint of right foot pain. Patient states that this seems to be right over her first toe. Patient states that it has been getting worse over the course last 3 months. Patient was seen 2 days ago and was diagnosed with a metatarsal stress fracture. Patient was given a carbon fiber plate to put in her shoe underneath her custom orthotics. Patient states her foot has increased in the pain. Patient states it is becoming more difficult to do walking on a regular basis. Patient states that the changes that Dr. Oneida Alar as well as myself main it seems that orthotic does not fit in the shoe well. Continues to have a dull aching pain can be sharp with ambulation.    Past medical history, social, surgical and family history all reviewed in electronic medical record.   Review of Systems: No headache, visual changes, nausea, vomiting, diarrhea, constipation, dizziness, abdominal pain, skin rash, fevers, chills, night sweats, weight loss, swollen lymph nodes, body aches, joint swelling, muscle aches, chest pain, shortness of breath, mood changes.   Objective Blood pressure 118/70, pulse 60, height 5\' 6"  (1.676 m), weight 158 lb (71.668 kg), SpO2 97.00%.  General: No apparent distress alert and oriented x3 mood and affect normal, dressed appropriately.  HEENT: Pupils equal, extraocular movements intact  Respiratory: Patient's speak in full sentences and does not appear short of breath  Cardiovascular: No lower extremity edema, non tender, no erythema  Skin: Warm dry intact with no signs of infection or rash on extremities or on axial skeleton.  Abdomen: Soft nontender  Neuro: Cranial nerves II through XII are intact, neurovascularly intact in all extremities with 2+ DTRs and 2+ pulses.    Lymph: No lymphadenopathy of posterior or anterior cervical chain or axillae bilaterally.  Gait normal with good balance and coordination.  MSK:  Non tender with full range of motion and good stability and symmetric strength and tone of shoulders, elbows, wrist, hip, knee and ankles bilaterally.  Foot Exam shows patient does have a bunion formation bilaterally greater on the right. Patient does have some mild hammering of the toes on the lateral aspect of the right foot #4 and 5. Patient does have mild collection of the longitudinal arch bilaterally. Mild callus formation on the plantar aspect of the second and third metatarsal heads on the right and third and fourth metatarsal heads on left.  On patient's orthotics we did decrease the height and had it on the right forefoot cork bottom. We are floating the first MTP joint. We moved metatarsal pad.   Impression and Recommendations:     This case required medical decision making of moderate complexity.

## 2014-03-30 NOTE — Patient Instructions (Addendum)
Good to see you I am sorry you are having more pain We changed the orthotic.  Call or send a message and we would need to get a boot.  See me as scheduled.

## 2014-04-18 ENCOUNTER — Encounter: Payer: Self-pay | Admitting: Family Medicine

## 2014-04-18 ENCOUNTER — Other Ambulatory Visit (INDEPENDENT_AMBULATORY_CARE_PROVIDER_SITE_OTHER): Payer: 59

## 2014-04-18 ENCOUNTER — Ambulatory Visit (INDEPENDENT_AMBULATORY_CARE_PROVIDER_SITE_OTHER): Payer: 59 | Admitting: Family Medicine

## 2014-04-18 VITALS — BP 122/70 | HR 54 | Ht 66.0 in | Wt 157.0 lb

## 2014-04-18 DIAGNOSIS — M79609 Pain in unspecified limb: Secondary | ICD-10-CM

## 2014-04-18 DIAGNOSIS — M2020 Hallux rigidus, unspecified foot: Secondary | ICD-10-CM

## 2014-04-18 DIAGNOSIS — M2021 Hallux rigidus, right foot: Secondary | ICD-10-CM

## 2014-04-18 DIAGNOSIS — M79671 Pain in right foot: Secondary | ICD-10-CM

## 2014-04-18 NOTE — Progress Notes (Signed)
  [name] [place], [place] [address] Phone: [phone] Subjective:     CC:  Right foot pain follow up.   [id number] CHARMAN BLASCO is a 58 y.o. female coming in with complaint of right foot pain. Patient states that this seems to be right over her first toe. Patient does have capsulitis of the metatarsal phalangeal joint and was being treated for a potential stress fracture with a carbon fiber plate. Patient did have adjustments made to her orthotics previously. Patient states she is doing better. Still has trouble when she is pushing off work he tries to do any jumping. Patient otherwise continues to wear the orthotic over the carbon fiber plate but does not however in the shoe for both. Patient is starting a new exercise routine and would like to be pain-free. Past medical history, social, surgical and family history all reviewed in electronic medical record.   Review of Systems: No headache, visual changes, nausea, vomiting, diarrhea, constipation, dizziness, abdominal pain, skin rash, fevers, chills, night sweats, weight loss, swollen lymph nodes, body aches, joint swelling, muscle aches, chest pain, shortness of breath, mood changes.   Objective Blood pressure 122/70, pulse 54, height 5\' 6"  (1.676 m), weight 157 lb (71.215 kg), SpO2 97.00%.  General: No apparent distress alert and oriented x3 mood and affect normal, dressed appropriately.  HEENT: Pupils equal, extraocular movements intact  Respiratory: Patient's speak in full sentences and does not appear short of breath  Cardiovascular: No lower extremity edema, non tender, no erythema  Skin: Warm dry intact with no signs of infection or rash on extremities or on axial skeleton.  Abdomen: Soft nontender  Neuro: Cranial nerves II through XII are intact, neurovascularly intact in all extremities with 2+ DTRs and 2+ pulses.  Lymph: No lymphadenopathy of posterior or anterior cervical chain  or axillae bilaterally.  Gait normal with good balance and coordination.  MSK:  Non tender with full range of motion and good stability and symmetric strength and tone of shoulders, elbows, wrist, hip, knee and ankles bilaterally.  Foot Exam shows patient does have a bunion formation bilaterally greater on the right. Patient does have some mild hammering of the toes on the lateral aspect of the right foot #4 and 5. Patient does have mild collection of the longitudinal arch bilaterally. Mild callus formation on the plantar aspect of the second and third metatarsal heads on the right and third and fourth metatarsal heads on left.  Limited muscular skeletal ultrasound shows the patient stress fracture of the first MTP is completely healed at this time. Patient still has what appears to be a capsulitis of the MTP joint.  After verbal consent patient was prepped with alcohol swab and with a 25-gauge 1 inch needle was injected with 0.5% Marcaine in the amount of 0.5 cc and 0.5 cc of Kenalog 40 mg/dL into the MP joint. This was done under ultrasound guidance. Patient tolerated the procedure well. Postinjection instructions given.     Impression and Recommendations:     This case required medical decision making of moderate complexity.

## 2014-04-18 NOTE — Assessment & Plan Note (Signed)
Patient was injected today. Patient tolerated the procedure extremely well. Patient is going to take the next 72 hours off from any excessive exercises. Patient will continue the carbon fiber and or the orthotic plate. Patient and will actually come back and see me again in 3 weeks. Patient does have a bone spur with in the metatarsophalangeal joint. Continuing to give her trouble we'll need to consider breaking up the bone spur.  Spent greater than 25 minutes with patient face-to-face and had greater than 50% of counseling including as described above in assessment and plan.

## 2014-04-18 NOTE — Patient Instructions (Signed)
Good to see you.  Ice will always be your friend.  Wear the  Carbon fiber with walking and the orthotic with working out.  Next 4 days no running or jumping.  Then ok to do boot camp Come back in 3 weeks to checkin and likely give me the carbon fiber plate.

## 2014-05-11 ENCOUNTER — Ambulatory Visit (INDEPENDENT_AMBULATORY_CARE_PROVIDER_SITE_OTHER): Payer: 59 | Admitting: Family Medicine

## 2014-05-11 ENCOUNTER — Encounter: Payer: Self-pay | Admitting: Family Medicine

## 2014-05-11 VITALS — BP 110/72 | HR 64 | Ht 66.0 in | Wt 155.0 lb

## 2014-05-11 DIAGNOSIS — M2021 Hallux rigidus, right foot: Secondary | ICD-10-CM

## 2014-05-11 DIAGNOSIS — M7050 Other bursitis of knee, unspecified knee: Secondary | ICD-10-CM | POA: Insufficient documentation

## 2014-05-11 DIAGNOSIS — M7150 Other bursitis, not elsewhere classified, unspecified site: Secondary | ICD-10-CM

## 2014-05-11 NOTE — Assessment & Plan Note (Signed)
Patient does have mild bursitis but is concerning bursa. If this continues I would like to do an ultrasound. Patient was given home exercises and will do topical anti-inflammatories. We discussed an icing protocol and which activities to avoid. Patient will come back and see me in 3 weeks if this does not completely resolve.

## 2014-05-11 NOTE — Progress Notes (Signed)
  [name] [place] [address], [place] [address] Phone: [phone] Subjective:     CC:  Right foot pain follow up.   [name] is a 58 y.o. female coming in with complaint of right foot pain. Patient states that this seems to be right over her first toe. Patient does have capsulitis of the metatarsal phalangeal joint and was being treated for a potential stress fracture with a carbon fiber plate. Patient did have adjustments made to her orthotics previously. Patient was doing better but did have an injection into the joint because she continued to have difficulty with push off. Patient states her toe is doing significantly better. Patient is not having any significant pain at this time.  Patient is having some pain though on the medial aspect of her right knee. This is a new problem. Worse when she goes upstairs or when she tries to extend her knee a significant amount. Describes it as a dull throbbing pain it hurts more when she is sitting as well. Denies any radiation or any numbness or weakness. Patient is a severity of 6/10. Not stopping her from any activity.  Past medical history, social, surgical and family history all reviewed in electronic medical record.   Review of Systems: No headache, visual changes, nausea, vomiting, diarrhea, constipation, dizziness, abdominal pain, skin rash, fevers, chills, night sweats, weight loss, swollen lymph nodes, body aches, joint swelling, muscle aches, chest pain, shortness of breath, mood changes.   Objective Blood pressure 110/72, pulse 64, height 5\' 6"  (1.676 m), weight 155 lb (70.308 kg), SpO2 98.00%.  General: No apparent distress alert and oriented x3 mood and affect normal, dressed appropriately.  HEENT: Pupils equal, extraocular movements intact  Respiratory: Patient's speak in full sentences and does not appear short of breath  Cardiovascular: No lower extremity edema, non tender, no  erythema  Skin: Warm dry intact with no signs of infection or rash on extremities or on axial skeleton.  Abdomen: Soft nontender  Neuro: Cranial nerves II through XII are intact, neurovascularly intact in all extremities with 2+ DTRs and 2+ pulses.  Lymph: No lymphadenopathy of posterior or anterior cervical chain or axillae bilaterally.  Gait normal with good balance and coordination.  MSK:  Non tender with full range of motion and good stability and symmetric strength and tone of shoulders, elbows, wrist, hip,  and ankles bilaterally.  Foot Exam shows patient does have a bunion formation bilaterally greater on the right. Patient does have some mild hammering of the toes on the lateral aspect of the right foot #4 and 5. Patient does have mild collection of the longitudinal arch bilaterally. Improved callus formation on the plantar aspect of the feet. Continues to have hallux rigidus of the first toe but no significant formation noted.  Right knee exam shows the patient does have full range of motion but is minimally tender over the medial joint line as well as the pes anserine bursa. Mild tightness of the right hamstring compared to the contralateral side. Neurovascularly intact distally. Full strength compared to contralateral side.   Impression and Recommendations:     This case required medical decision making of moderate complexity.

## 2014-05-11 NOTE — Patient Instructions (Signed)
Good to see you as always.  Ice still your friend Continue to wear good shoes.  Look into [facility].  Your knee do exercises for the hamstring Pennsaid twice daily for 5 days on the nnee and then as needed.  Make an appointment in 3 weeks if not perfect

## 2014-05-11 NOTE — Assessment & Plan Note (Signed)
Patient is doing significantly better after the injection. Encourage her to continue to wear the custom orthotics as well as the icing for: Needed. Patient will continue with the topical anti-inflammatories. Patient and will come back and see me again on an as-needed basis for this problem.

## 2014-05-23 ENCOUNTER — Encounter: Payer: Self-pay | Admitting: Family Medicine

## 2014-06-04 ENCOUNTER — Encounter: Payer: Self-pay | Admitting: Family Medicine

## 2014-07-11 ENCOUNTER — Other Ambulatory Visit: Payer: Self-pay

## 2014-09-19 ENCOUNTER — Other Ambulatory Visit (HOSPITAL_COMMUNITY): Payer: Self-pay | Admitting: Obstetrics & Gynecology

## 2014-09-19 DIAGNOSIS — Z1231 Encounter for screening mammogram for malignant neoplasm of breast: Secondary | ICD-10-CM

## 2014-10-06 ENCOUNTER — Encounter (HOSPITAL_COMMUNITY): Payer: Self-pay | Admitting: Nurse Practitioner

## 2014-10-06 ENCOUNTER — Emergency Department (HOSPITAL_COMMUNITY): Payer: 59

## 2014-10-06 ENCOUNTER — Encounter (HOSPITAL_COMMUNITY): Payer: Self-pay

## 2014-10-06 ENCOUNTER — Emergency Department (HOSPITAL_COMMUNITY)
Admission: EM | Admit: 2014-10-06 | Discharge: 2014-10-06 | Disposition: A | Discharge status: Other Acute Inpatient Hospital | Payer: 59 | Source: Home / Self Care | Attending: Family Medicine | Admitting: Family Medicine

## 2014-10-06 ENCOUNTER — Emergency Department (HOSPITAL_COMMUNITY)
Admission: EM | Admit: 2014-10-06 | Discharge: 2014-10-06 | Disposition: A | Discharge status: Home / Self Care | Payer: 59 | Attending: Emergency Medicine | Admitting: Emergency Medicine

## 2014-10-06 DIAGNOSIS — R1010 Upper abdominal pain, unspecified: Secondary | ICD-10-CM

## 2014-10-06 DIAGNOSIS — Z791 Long term (current) use of non-steroidal anti-inflammatories (NSAID): Secondary | ICD-10-CM | POA: Diagnosis not present

## 2014-10-06 DIAGNOSIS — Z862 Personal history of diseases of the blood and blood-forming organs and certain disorders involving the immune mechanism: Secondary | ICD-10-CM | POA: Diagnosis not present

## 2014-10-06 DIAGNOSIS — Z8742 Personal history of other diseases of the female genital tract: Secondary | ICD-10-CM | POA: Insufficient documentation

## 2014-10-06 DIAGNOSIS — Z87891 Personal history of nicotine dependence: Secondary | ICD-10-CM | POA: Insufficient documentation

## 2014-10-06 DIAGNOSIS — Z85828 Personal history of other malignant neoplasm of skin: Secondary | ICD-10-CM | POA: Diagnosis not present

## 2014-10-06 DIAGNOSIS — Z79899 Other long term (current) drug therapy: Secondary | ICD-10-CM | POA: Insufficient documentation

## 2014-10-06 DIAGNOSIS — Z90710 Acquired absence of both cervix and uterus: Secondary | ICD-10-CM | POA: Diagnosis not present

## 2014-10-06 DIAGNOSIS — R1013 Epigastric pain: Secondary | ICD-10-CM

## 2014-10-06 DIAGNOSIS — Z9049 Acquired absence of other specified parts of digestive tract: Secondary | ICD-10-CM | POA: Insufficient documentation

## 2014-10-06 DIAGNOSIS — Z7951 Long term (current) use of inhaled steroids: Secondary | ICD-10-CM | POA: Insufficient documentation

## 2014-10-06 DIAGNOSIS — IMO0002 Reserved for concepts with insufficient information to code with codable children: Secondary | ICD-10-CM

## 2014-10-06 DIAGNOSIS — K2970 Gastritis, unspecified, without bleeding: Secondary | ICD-10-CM | POA: Diagnosis not present

## 2014-10-06 DIAGNOSIS — M7981 Nontraumatic hematoma of soft tissue: Secondary | ICD-10-CM | POA: Insufficient documentation

## 2014-10-06 DIAGNOSIS — R109 Unspecified abdominal pain: Secondary | ICD-10-CM

## 2014-10-06 LAB — CBC WITH DIFFERENTIAL/PLATELET
Basophils Absolute: 0 10*3/uL (ref 0.0–0.1)
Basophils Relative: 1 % (ref 0–1)
Eosinophils Absolute: 0.2 10*3/uL (ref 0.0–0.7)
Eosinophils Relative: 2 % (ref 0–5)
HCT: 39.7 % (ref 36.0–46.0)
Hemoglobin: 13.4 g/dL (ref 12.0–15.0)
Lymphocytes Relative: 19 % (ref 12–46)
Lymphs Abs: 1.6 10*3/uL (ref 0.7–4.0)
MCH: 30.1 pg (ref 26.0–34.0)
MCHC: 33.8 g/dL (ref 30.0–36.0)
MCV: 89.2 fL (ref 78.0–100.0)
Monocytes Absolute: 0.6 10*3/uL (ref 0.1–1.0)
Monocytes Relative: 8 % (ref 3–12)
Neutro Abs: 5.8 10*3/uL (ref 1.7–7.7)
Neutrophils Relative %: 70 % (ref 43–77)
Platelets: 258 10*3/uL (ref 150–400)
RBC: 4.45 MIL/uL (ref 3.87–5.11)
RDW: 12.3 % (ref 11.5–15.5)
WBC: 8.2 10*3/uL (ref 4.0–10.5)

## 2014-10-06 LAB — COMPREHENSIVE METABOLIC PANEL
ALT: 14 U/L (ref 0–35)
AST: 17 U/L (ref 0–37)
Albumin: 4.2 g/dL (ref 3.5–5.2)
Alkaline Phosphatase: 38 U/L — ABNORMAL LOW (ref 39–117)
Anion gap: 5 (ref 5–15)
BUN: 11 mg/dL (ref 6–23)
CO2: 29 mmol/L (ref 19–32)
Calcium: 9 mg/dL (ref 8.4–10.5)
Chloride: 105 mmol/L (ref 96–112)
Creatinine, Ser: 0.88 mg/dL (ref 0.50–1.10)
GFR calc Af Amer: 83 mL/min — ABNORMAL LOW (ref >90)
GFR calc non Af Amer: 72 mL/min — ABNORMAL LOW (ref >90)
Glucose, Bld: 103 mg/dL — ABNORMAL HIGH (ref 70–99)
Potassium: 4 mmol/L (ref 3.5–5.1)
Sodium: 139 mmol/L (ref 135–145)
Total Bilirubin: 0.8 mg/dL (ref 0.3–1.2)
Total Protein: 7 g/dL (ref 6.0–8.3)

## 2014-10-06 LAB — URINALYSIS, ROUTINE W REFLEX MICROSCOPIC
Bilirubin Urine: NEGATIVE
Glucose, UA: NEGATIVE mg/dL
Hgb urine dipstick: NEGATIVE
Ketones, ur: 40 mg/dL — AB
Leukocytes, UA: NEGATIVE
Nitrite: NEGATIVE
Protein, ur: NEGATIVE mg/dL
Specific Gravity, Urine: 1.03 (ref 1.005–1.030)
Urobilinogen, UA: 0.2 mg/dL (ref 0.0–1.0)
pH: 5.5 (ref 5.0–8.0)

## 2014-10-06 LAB — LIPASE, BLOOD: Lipase: 23 U/L (ref 11–59)

## 2014-10-06 MED ORDER — OXYCODONE-ACETAMINOPHEN 5-325 MG PO TABS: 1.0000 | ORAL_TABLET | ORAL | Status: DC | PRN

## 2014-10-06 MED ORDER — PANTOPRAZOLE SODIUM 40 MG PO TBEC
40.0000 mg | DELAYED_RELEASE_TABLET | Freq: Once | ORAL | Status: AC
  Administered 2014-10-06: 40 mg via ORAL
  Filled 2014-10-06: qty 1

## 2014-10-06 MED ORDER — ONDANSETRON 4 MG PO TBDP: ORAL_TABLET | ORAL | Status: DC

## 2014-10-06 MED ORDER — OXYCODONE-ACETAMINOPHEN 5-325 MG PO TABS
2.0000 | ORAL_TABLET | Freq: Once | ORAL | Status: AC
  Administered 2014-10-06: 2 via ORAL
  Filled 2014-10-06: qty 2

## 2014-10-06 MED ORDER — IOHEXOL 300 MG/ML  SOLN
100.0000 mL | Freq: Once | INTRAMUSCULAR | Status: AC | PRN
  Administered 2014-10-06: 100 mL via INTRAVENOUS

## 2014-10-06 MED ORDER — ONDANSETRON HCL 4 MG/2ML IJ SOLN
4.0000 mg | Freq: Once | INTRAMUSCULAR | Status: AC
  Administered 2014-10-06: 4 mg via INTRAVENOUS
  Filled 2014-10-06: qty 2

## 2014-10-06 MED ORDER — GI COCKTAIL ~~LOC~~
30.0000 mL | Freq: Once | ORAL | Status: AC
  Administered 2014-10-06: 30 mL via ORAL

## 2014-10-06 MED ORDER — IOHEXOL 300 MG/ML  SOLN
25.0000 mL | Freq: Once | INTRAMUSCULAR | Status: AC | PRN
  Administered 2014-10-06: 25 mL via ORAL

## 2014-10-06 MED ORDER — MORPHINE SULFATE 4 MG/ML IJ SOLN
4.0000 mg | Freq: Once | INTRAMUSCULAR | Status: AC
  Administered 2014-10-06: 4 mg via INTRAVENOUS
  Filled 2014-10-06: qty 1

## 2014-10-06 MED ORDER — GI COCKTAIL ~~LOC~~
ORAL | Status: AC
  Filled 2014-10-06: qty 30

## 2014-10-06 MED ORDER — OMEPRAZOLE 20 MG PO CPDR: 20.0000 mg | DELAYED_RELEASE_CAPSULE | Freq: Every day | ORAL | Status: DC

## 2014-10-06 MED ORDER — SODIUM CHLORIDE 0.9 % IV BOLUS (SEPSIS)
500.0000 mL | Freq: Once | INTRAVENOUS | Status: AC
  Administered 2014-10-06: 500 mL via INTRAVENOUS

## 2014-10-06 NOTE — ED Notes (Signed)
Patient c/o severe epigastric pain onset this morning around 4 am. Patient reports she has not had a bowel movement today. She does not feel nauseous and has not vomited. Patient reports hx of pancreatic surgery in May 2015. She is sitting upright and able to speak in complete sentences.

## 2014-10-06 NOTE — ED Provider Notes (Signed)
CSN: [id number]     Arrival date & time 10/06/14  1415 History   First MD Initiated Contact with Patient 10/06/14 1505     Chief Complaint  Patient presents with  . Abdominal Pain     (Consider location/radiation/quality/duration/timing/severity/associated sxs/prior Treatment) Patient is a 59 y.o. female presenting with abdominal pain. The history is provided by the patient and medical records. No language interpreter was used.  Abdominal Pain Associated symptoms: nausea   Associated symptoms: no chest pain, no constipation, no cough, no diarrhea, no dysuria, no fatigue, no fever, no hematuria, no shortness of breath and no vomiting      [name] is a 59 y.o. female  with a hx of seasonal allergies, anemia, total hysterectomy, subtotal pancrectomy (may 2015) presents to the Emergency Department complaining of gradual, persistent, progressively worsening epigastric abd pain onset 4am this morning.  Pain is burning, rated at a 7/10 and radiates into her lower abd.  Pt reports she has had intermittent nausea without vomiting, diarrhea.  Pt reports she was initially seen at [facility] for this pain and transferred to the ED.  Pt was given a GI cocktail without relief.  Pt reports weekly EtOH usage on the weekends, but no weeknight drinking.  Pt reports only intermittent NSAID usage and no HX of PUD.  Pt denies personal cardiac hx, but reports family hx of cardiac disease (grandfather with MI at age 68).  Pt denies CP, SOB, diaphoresis, jaw pain, arm pain, syncope/near syncope.  Pt reports lying flat worsens the pain, but otherwise position changes do not aggravate or alleviate symptoms.  She denies vomiting, diarrhea.  Last BM was yesterday and normal without melena or hematochezia.  Pt also denies fever, chills, headache, neck pain, neck stiffness, dysuria, hematuria, vaginal discharge.  Past Medical History  Diagnosis Date  . SVD (spontaneous vaginal delivery)     x 1  . Seasonal allergies   .  Fibroids 09/07/2013  . Anemia     on iron  . PONV (postoperative nausea and vomiting)     non recent   . Cancer     skin cancer  . S/P hysterectomy with oophorectomy 09/07/2013   Past Surgical History  Procedure Laterality Date  . Colonoscopy      2 or 3  . Dilation and curettage of uterus      x 3  . Wisdom tooth extraction    . Eye surgery      lasik bilateral  . Robotic assisted total hysterectomy with bilateral salpingo oopherectomy Bilateral 09/07/2013    Procedure: ROBOTIC ASSISTED TOTAL HYSTERECTOMY WITH BILATERAL SALPINGO OOPHORECTOMY;  Surgeon: Elveria Royals, MD;  Location: [facility];  Service: Gynecology;  Laterality: Bilateral;  . Laparoscopy N/A 09/25/2013    Procedure: LAPAROSCOPY OPERATIVE;  Surgeon: Elveria Royals, MD;  Location: [facility];  Service: Gynecology;  Laterality: N/A;  . Eus N/A 11/16/2013    Procedure: UPPER ENDOSCOPIC ULTRASOUND (EUS) LINEAR;  Surgeon: Milus Banister, MD;  Location: [facility];  Service: Endoscopy;  Laterality: N/A;  . Abdominal hysterectomy    . Pancreatectomy N/A 12/07/2013    Procedure: LAPAROSCOPIC DISTAL  PANCREATECTOMY;  Surgeon: Stark Klein, MD;  Location: [facility];  Service: General;  Laterality: N/A;   Family History  Problem Relation Age of Onset  . Cancer Mother 36    colon,lung   . COPD Mother    History  Substance Use Topics  . Smoking status: Former Smoker -- 1.00 packs/day for 10  years    Types: Cigarettes    Quit date: 01/02/1987  . Smokeless tobacco: Never Used  . Alcohol Use: Yes     Comment: socially   OB History    Gravida Para Term Preterm AB TAB SAB Ectopic Multiple Living   1 1 1             Review of Systems  Constitutional: Negative for fever, diaphoresis, appetite change, fatigue and unexpected weight change.  HENT: Negative for mouth sores.   Eyes: Negative for visual disturbance.  Respiratory: Negative for cough, chest tightness, shortness of breath and wheezing.   Cardiovascular: Negative for chest  pain.  Gastrointestinal: Positive for nausea and abdominal pain. Negative for vomiting, diarrhea and constipation.  Endocrine: Negative for polydipsia, polyphagia and polyuria.  Genitourinary: Negative for dysuria, urgency, frequency and hematuria.  Musculoskeletal: Negative for back pain and neck stiffness.  Skin: Negative for rash.  Allergic/Immunologic: Negative for immunocompromised state.  Neurological: Negative for syncope, light-headedness and headaches.  Hematological: Does not bruise/bleed easily.  Psychiatric/Behavioral: Negative for sleep disturbance. The patient is not nervous/anxious.       Allergies  Review of patient's allergies indicates no known allergies.  Home Medications   Prior to Admission medications   Medication Sig Start Date End Date Taking? Authorizing Provider  ALPRAZolam [name]) 0.5 MG tablet Take 0.5 mg by mouth daily as needed for anxiety.   Yes Historical Provider, MD  Artificial Tear Solution (SOOTHE HYDRATION) 1.25 % SOLN Apply 1 drop to eye at bedtime.   Yes Historical Provider, MD  ascorbic acid (VITAMIN C) 1000 MG tablet Take 1,000 mg by mouth daily.   Yes Historical Provider, MD  b complex vitamins tablet Take 1 tablet by mouth daily.   Yes Historical Provider, MD  Calcium Carb-Cholecalciferol (CALCIUM 600 + D PO) Take 1 tablet by mouth daily.   Yes Historical Provider, MD  carboxymethylcellulose (REFRESH PLUS) 0.5 % SOLN Place 1 drop into both eyes daily as needed (Dry eyes).    Yes Historical Provider, MD  Diclofenac Sodium 2 % SOLN Place 1 application onto the skin as needed (for pain).   Yes Historical Provider, MD  estradiol (VIVELLE-DOT) 0.1 MG/24HR patch Place 1.5 patches onto the skin 2 (two) times a week. Place 1.5 patches (0.1mg  + 0.05mg ) on abdomen on Wednesday and Sunday  of each week.   Yes Historical Provider, MD  fexofenadine-pseudoephedrine (ALLEGRA-D) 60-120 MG per tablet Take 1 tablet by mouth daily.   Yes Historical Provider, MD   fluticasone (FLONASE) 50 MCG/ACT nasal spray Place 1 spray into both nostrils daily.    Yes Historical Provider, MD  Lactobacillus (ACIDOPHILUS PO) Take 1 tablet by mouth daily.   Yes Historical Provider, MD  meclizine (DRAMAMINE II) 25 MG tablet Take 25 mg by mouth daily.    Yes Historical Provider, MD  Melatonin 10 MG TABS Take 20 mg by mouth at bedtime.   Yes Historical Provider, MD  Misc Natural Products (TURMERIC CURCUMIN) CAPS Take 1 capsule by mouth daily.   Yes Historical Provider, MD  montelukast (SINGULAIR) 10 MG tablet Take 10 mg by mouth at bedtime.   Yes Historical Provider, MD  Multiple Vitamin (MULTIVITAMIN) tablet Take 1 tablet by mouth daily.   Yes Historical Provider, MD  naproxen sodium (ANAPROX) 220 MG tablet Take 440 mg by mouth 2 (two) times daily as needed (for pain).   Yes Historical Provider, MD  acetaminophen (TYLENOL) 500 MG tablet Take 2 tablets (1,000 mg total) by mouth  every 6 (six) hours as needed. 12/13/13   Stark Klein, MD  Diclofenac Sodium 2 % SOLN Apply twice daily. 03/29/14   [name], DO  Ketoprofen POWD Ketoprofen 2.5% gel use on the affected area TID-QID 03/16/14   Stefanie Libel, MD  metoCLOPramide (REGLAN) 5 MG tablet Take 1 tablet (5 mg total) by mouth 3 (three) times daily before meals. 12/13/13   Stark Klein, MD  omeprazole (PRILOSEC) 20 MG capsule Take 1 capsule (20 mg total) by mouth daily. 10/06/14   Maryln Eastham, PA-C  ondansetron (ZOFRAN ODT) 4 MG disintegrating tablet 4mg  ODT q4 hours prn nausea/vomit 10/06/14   Winnell Bento, PA-C  oxyCODONE-acetaminophen (PERCOCET) 5-325 MG per tablet Take 1-2 tablets by mouth every 4 (four) hours as needed. 10/06/14   Lenix Kidd, PA-C  promethazine (PHENERGAN) 12.5 MG tablet Take 1 tablet (12.5 mg total) by mouth every 6 (six) hours as needed for nausea or vomiting (nausea). 12/13/13   Stark Klein, MD  traMADol (ULTRAM) 50 MG tablet Take 1-2 tablets (50-100 mg total) by mouth every 6 (six)  hours as needed for moderate pain or severe pain. 01/09/14   Stark Klein, MD   BP 127/69 mmHg  Pulse 52  Temp(Src) 98.5 F (36.9 C) (Oral)  Resp 18  SpO2 98% Physical Exam  Constitutional: She appears well-developed and well-nourished. No distress.  Awake, alert, nontoxic appearance  HENT:  Head: Normocephalic and atraumatic.  Mouth/Throat: Oropharynx is clear and moist. No oropharyngeal exudate.  Eyes: Conjunctivae are normal. No scleral icterus.  Neck: Normal range of motion. Neck supple.  Cardiovascular: Normal rate, regular rhythm, normal heart sounds and intact distal pulses.   No murmur heard. Pulmonary/Chest: Effort normal and breath sounds normal. No respiratory distress. She has no wheezes.  Equal chest expansion  Abdominal: Soft. Bowel sounds are normal. She exhibits no distension and no mass. There is generalized tenderness. There is guarding. There is no rebound and no CVA tenderness.  Generalized abd pain with guarding throughout, worst in the epigastrium No CVA tenderness No right upper quadrant or right lower quadrant abdominal tenderness Negative Murphy sign  Musculoskeletal: Normal range of motion. She exhibits no edema.  Neurological: She is alert.  Speech is clear and goal oriented Moves extremities without ataxia  Skin: Skin is warm and dry. She is not diaphoretic.  Psychiatric: She has a normal mood and affect.  Nursing note and vitals reviewed.   ED Course  Procedures (including critical care time) Labs Review Labs Reviewed  COMPREHENSIVE METABOLIC PANEL - Abnormal; Notable for the following:    Glucose, Bld 103 (*)    Alkaline Phosphatase 38 (*)    GFR calc non Af Amer 72 (*)    GFR calc Af Amer 83 (*)    All other components within normal limits  URINALYSIS, ROUTINE W REFLEX MICROSCOPIC - Abnormal; Notable for the following:    Ketones, ur 40 (*)    All other components within normal limits  CBC WITH DIFFERENTIAL/PLATELET  LIPASE, BLOOD     Imaging Review Ct Abdomen Pelvis W Contrast  10/06/2014   CLINICAL DATA:  Very for evaluation upper midline abdominal pain radiating inferiorly, a personal history of prior partial pancreas resection  EXAM: CT ABDOMEN AND PELVIS WITH CONTRAST  TECHNIQUE: Multidetector CT imaging of the abdomen and pelvis was performed using the standard protocol following bolus administration of intravenous contrast.  CONTRAST:  49mL OMNIPAQUE IOHEXOL 300 MG/ML SOLN, 171mL OMNIPAQUE IOHEXOL 300 MG/ML SOLN  COMPARISON:  09/18/2013  FINDINGS: Visualized portions of the lung bases clear area heart is normal.  Bile hepatic steatosis. Gallbladder is normal. Spleen is normal. Postsurgical changes consistent with resection of the pancreatic tail with the remainder of the pancreas normal. Anterior to the spleen there is an 18 mm circumscribed fluid collection. In the splenic hilum there is a 30 mm these fluid collections were not previously present. There is no evidence of acute inflammatory change.  Adrenal glands and kidneys are normal. Aorta is normal. Large bowel demonstrates significant diverticulosis of the sigmoid colon region. No evidence of diverticulitis. Small bowel and appendix are normal. Stomach is normal.  No ascites. No fluid collections in the pelvis. Bladder is normal. Reproductive organs not identified.  No acute musculoskeletal findings.  Fluid collection.  IMPRESSION: Small fluid collections in the surgical bed status post prior partial pancreatic resection. These likely represent postoperative seroma as given the absence of other findings to suggest acute inflammation.  Diverticulosis sigmoid colon with no evidence of diverticulitis.   Electronically Signed   By: Skipper Cliche M.D.   On: 10/06/2014 19:30   Dg Abd Acute W/chest  10/06/2014   CLINICAL DATA:  Epigastric abdominal pain.  EXAM: ACUTE ABDOMEN SERIES (ABDOMEN 2 VIEW & CHEST 1 VIEW)  COMPARISON:  None.  FINDINGS: There is no evidence of dilated  bowel loops or free intraperitoneal air. No radiopaque calculi or other significant radiographic abnormality is seen. Surgical clips are seen in the left upper quadrant.  Heart size and mediastinal contours are within normal limits. Both lungs are clear. No evidence of pleural effusion.  IMPRESSION: Negative abdominal radiographs.  No active cardiopulmonary disease.   Electronically Signed   By: Earle Gell M.D.   On: 10/06/2014 16:29     EKG Interpretation None      MDM   Final diagnoses:  Abdominal pain  Epigastric abdominal pain  Seroma  Gastritis   [name] presents with abd pain, worst in the epigastrium.  Pt with guarding on exam.  Will give pain control, check blood work and reassess.    7:50PM CT scan with small fluid collections in the surgical bed status post prior partial pancreatic resection likely seroma. Patient with improved abdominal pain after pain control. She is tolerating by mouth without difficulty.  Her pain remains under control. She has no emesis here in the department.  On repeat exam patient abdomen soft with minimal tenderness persistent in the epigastrium.  No peritoneal signs.  Patient pain consistent with gastritis and she has risk factors including significant stress at home, regular alcohol intake and intermittent NSAID usage. Patient denies smoking. Will treat as gastritis versus possible peptic ulcer disease with omeprazole. Patient discharged home with pain control and nausea medicine. She is to follow-up with her surgeon Dr. Barry Dienes for discussion of her seroma.  She is also follow-up with gastroenterology for further evaluation and potential endoscopy.  No evidence of appendicitis, perforated ulcer, bowel obstruction, diverticulitis, cholecystitis.  Patient is afebrile and non-tachycardic. She is well-appearing and desires for discharge home.  I have personally reviewed patient's vitals, nursing note and any pertinent labs or imaging.  I performed an  undressed physical exam.    It has been determined that no acute conditions requiring further emergency intervention are present at this time. The patient/guardian have been advised of the diagnosis and plan. I reviewed all labs and imaging including any potential incidental findings. We have discussed signs and symptoms that warrant return to the ED and they  are listed in the discharge instructions.    Vital signs are stable at discharge.   BP 127/69 mmHg  Pulse 52  Temp(Src) 98.5 F (36.9 C) (Oral)  Resp 18  SpO2 98%        Abigail Butts, PA-C 10/06/14 2147  Orpah Greek, MD 10/06/14 2342

## 2014-10-06 NOTE — Discharge Instructions (Signed)
1. Medications: zofran, percocet, omeprazole, usual home medications 2. Treatment: rest, drink plenty of fluids, advance diet slowly beginning with clear liquids 3. Follow Up: Please followup with your Dr. Barry Dienes and gastroenterology in 2-3 days for discussion of your diagnoses and further evaluation after today's visit; Please return to the ER for persistent vomiting, high fevers or worsening symptoms  Gastritis, Adult Gastritis is soreness and swelling (inflammation) of the lining of the stomach. Gastritis can develop as a sudden onset (acute) or long-term (chronic) condition. If gastritis is not treated, it can lead to stomach bleeding and ulcers. CAUSES  Gastritis occurs when the stomach lining is weak or damaged. Digestive juices from the stomach then inflame the weakened stomach lining. The stomach lining may be weak or damaged due to viral or bacterial infections. One common bacterial infection is the Helicobacter pylori infection. Gastritis can also result from excessive alcohol consumption, taking certain medicines, or having too much acid in the stomach.  SYMPTOMS  In some cases, there are no symptoms. When symptoms are present, they may include:  Pain or a burning sensation in the upper abdomen.  Nausea.  Vomiting.  An uncomfortable feeling of fullness after eating. DIAGNOSIS  Your caregiver may suspect you have gastritis based on your symptoms and a physical exam. To determine the cause of your gastritis, your caregiver may perform the following:  Blood or stool tests to check for the H pylori bacterium.  Gastroscopy. A thin, flexible tube (endoscope) is passed down the esophagus and into the stomach. The endoscope has a light and camera on the end. Your caregiver uses the endoscope to view the inside of the stomach.  Taking a tissue sample (biopsy) from the stomach to examine under a microscope. TREATMENT  Depending on the cause of your gastritis, medicines may be prescribed.  If you have a bacterial infection, such as an H pylori infection, antibiotics may be given. If your gastritis is caused by too much acid in the stomach, H2 blockers or antacids may be given. Your caregiver may recommend that you stop taking aspirin, ibuprofen, or other nonsteroidal anti-inflammatory drugs (NSAIDs). HOME CARE INSTRUCTIONS  Only take over-the-counter or prescription medicines as directed by your caregiver.  If you were given antibiotic medicines, take them as directed. Finish them even if you start to feel better.  Drink enough fluids to keep your urine clear or pale yellow.  Avoid foods and drinks that make your symptoms worse, such as:  Caffeine or alcoholic drinks.  Chocolate.  Peppermint or mint flavorings.  Garlic and onions.  Spicy foods.  Citrus fruits, such as oranges, lemons, or limes.  Tomato-based foods such as sauce, chili, salsa, and pizza.  Fried and fatty foods.  Eat small, frequent meals instead of large meals. SEEK IMMEDIATE MEDICAL CARE IF:   You have black or dark red stools.  You vomit blood or material that looks like coffee grounds.  You are unable to keep fluids down.  Your abdominal pain gets worse.  You have a fever.  You do not feel better after 1 week.  You have any other questions or concerns. MAKE SURE YOU:  Understand these instructions.  Will watch your condition.  Will get help right away if you are not doing well or get worse. Document Released: 07/14/2001 Document Revised: 01/19/2012 Document Reviewed: 09/02/2011 [facility] Patient Information 2015 [place], [place]. This information is not intended to replace advice given to you by your health care provider. Make sure you discuss any questions you  have with your health care provider.

## 2014-10-06 NOTE — ED Provider Notes (Signed)
CSN: [id number]     Arrival date & time 10/06/14  1155 History   First MD Initiated Contact with Patient 10/06/14 1341     Chief Complaint  Patient presents with  . Abdominal Pain   (Consider location/radiation/quality/duration/timing/severity/associated sxs/prior Treatment) HPI Comments:  59 year old female that awoke this morning at 0400 hrs. with sudden acute severe epigastric pain. The pain is worse in the epigastrium but does radiate lower toward the umbilicus into the left and right abdomen. She has had no nausea, vomiting, diarrhea or constipation. She did take a Pepcid this morning but without relief. Denies eating any known substances that may have attributed to the pain. 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 She also has remote history of a complete hysterectomy. Pain is better lying down. When sitting up and taking a deep breath this exacerbates the pain as well.   Past Medical History  Diagnosis Date  . SVD (spontaneous vaginal delivery)     x 1  . Seasonal allergies   . Fibroids 09/07/2013  . Anemia     on iron  . PONV (postoperative nausea and vomiting)     non recent   . Cancer     skin cancer  . S/P hysterectomy with oophorectomy 09/07/2013   Past Surgical History  Procedure Laterality Date  . Colonoscopy      2 or 3  . Dilation and curettage of uterus      x 3  . Wisdom tooth extraction    . Eye surgery      lasik bilateral  . Robotic assisted total hysterectomy with bilateral salpingo oopherectomy Bilateral 09/07/2013    Procedure: ROBOTIC ASSISTED TOTAL HYSTERECTOMY WITH BILATERAL SALPINGO OOPHORECTOMY;  Surgeon: Elveria Royals, MD;  Location: [facility];  Service: Gynecology;  Laterality: Bilateral;  . Laparoscopy N/A 09/25/2013    Procedure: LAPAROSCOPY OPERATIVE;  Surgeon: Elveria Royals, MD;  Location: [facility];  Service: Gynecology;  Laterality: N/A;  . Eus N/A 11/16/2013    Procedure: UPPER ENDOSCOPIC ULTRASOUND (EUS)  LINEAR;  Surgeon: Milus Banister, MD;  Location: [facility];  Service: Endoscopy;  Laterality: N/A;  . Abdominal hysterectomy    . Pancreatectomy N/A 12/07/2013    Procedure: LAPAROSCOPIC DISTAL  PANCREATECTOMY;  Surgeon: Stark Klein, MD;  Location: [facility];  Service: General;  Laterality: N/A;   Family History  Problem Relation Age of Onset  . Cancer Mother 6    colon,lung   . COPD Mother    History  Substance Use Topics  . Smoking status: Former Smoker -- 1.00 packs/day for 10 years    Types: Cigarettes    Quit date: 01/02/1987  . Smokeless tobacco: Never Used  . Alcohol Use: Yes     Comment: socially   OB History    Gravida Para Term Preterm AB TAB SAB Ectopic Multiple Living   1 1 1             Review of Systems  Constitutional: Positive for activity change.  Respiratory: Negative.   Gastrointestinal: Positive for abdominal pain. Negative for nausea, vomiting, constipation and abdominal distention.  Genitourinary: Negative.   Skin: Negative.     Allergies  Review of patient's allergies indicates no known allergies.  Home Medications   Prior to Admission medications   Medication Sig Start Date End Date Taking? Authorizing Provider  ALPRAZolam [name]) 0.5 MG tablet Take 0.5 mg by mouth daily as needed for anxiety.  Historical Provider, MD  ascorbic acid (VITAMIN C) 1000 MG tablet Take 1,000 mg by mouth daily.    Historical Provider, MD  b complex vitamins tablet Take 1 tablet by mouth daily.    Historical Provider, MD  Calcium Carb-Cholecalciferol (CALCIUM 600 + D PO) Take 1 tablet by mouth daily.    Historical Provider, MD  carboxymethylcellulose (REFRESH PLUS) 0.5 % SOLN Place 1 drop into both eyes daily as needed (Dry eyes).     Historical Provider, MD  estradiol (VIVELLE-DOT) 0.05 MG/24HR patch Place 1 patch onto the skin 2 (two) times a week. Place 5.0 mg patch abdomen on Wednesday, and Saturday of each week.    Historical Provider, MD  estradiol (VIVELLE-DOT)  0.1 MG/24HR patch Place 1 patch onto the skin 2 (two) times a week. Place 0.1 mg patch on abdomen on Wednesday, and Saturday of each week.    Historical Provider, MD  ferrous sulfate 325 (65 FE) MG tablet Take 325 mg by mouth daily with breakfast.    Historical Provider, MD  fexofenadine-pseudoephedrine (ALLEGRA-D) 60-120 MG per tablet Take 1 tablet by mouth daily.    Historical Provider, MD  fluticasone (FLONASE) 50 MCG/ACT nasal spray Place 1 spray into both nostrils daily.     Historical Provider, MD  Lactobacillus (ACIDOPHILUS PO) Take 1 tablet by mouth daily.    Historical Provider, MD  Magnesium 250 MG TABS Take 250 mg by mouth daily.    Historical Provider, MD  meclizine (DRAMAMINE II) 25 MG tablet Take 37.5 mg by mouth daily. Take 1 and 1/2 tablet daily/    Historical Provider, MD  montelukast (SINGULAIR) 10 MG tablet Take 10 mg by mouth at bedtime.    Historical Provider, MD  Multiple Vitamin (MULTIVITAMIN) tablet Take 1 tablet by mouth daily.    Historical Provider, MD  acetaminophen (TYLENOL) 500 MG tablet Take 2 tablets (1,000 mg total) by mouth every 6 (six) hours as needed. 12/13/13   Stark Klein, MD  Diclofenac Sodium 2 % SOLN Apply twice daily. 03/29/14   [name], DO  Ketoprofen POWD Ketoprofen 2.5% gel use on the affected area TID-QID 03/16/14   Stefanie Libel, MD  metoCLOPramide (REGLAN) 5 MG tablet Take 1 tablet (5 mg total) by mouth 3 (three) times daily before meals. 12/13/13   Stark Klein, MD  promethazine (PHENERGAN) 12.5 MG tablet Take 1 tablet (12.5 mg total) by mouth every 6 (six) hours as needed for nausea or vomiting (nausea). 12/13/13   Stark Klein, MD  traMADol (ULTRAM) 50 MG tablet Take 1-2 tablets (50-100 mg total) by mouth every 6 (six) hours as needed for moderate pain or severe pain. 01/09/14   Stark Klein, MD   BP 149/65 mmHg  Pulse 62  Temp(Src) 97.9 F (36.6 C) (Oral)  Resp 14  SpO2 100% Physical Exam  Constitutional: She is oriented to person, place,  and time. She appears well-developed and well-nourished. No distress.  Patient is holding her abdomen and crying with pain.  Neck: Normal range of motion. Neck supple.  Cardiovascular: Normal rate, regular rhythm and normal heart sounds.   Pulmonary/Chest: Effort normal and breath sounds normal. No respiratory distress.  Abdominal: There is tenderness. There is guarding.  Greatest amount of tenderness in the epigastrium. There is also tenderness to the lower most sternum and over the xiphoid process. There is mild tenderness to the mid left abdomen and less to the right abdomen. No pain in the lower abdomen or anterior pelvis.  Neurological: She  is alert and oriented to person, place, and time. She exhibits normal muscle tone.  Skin: Skin is warm and dry.  Nursing note and vitals reviewed.   ED Course  Procedures (including critical care time) Labs Review Labs Reviewed - No data to display  Imaging Review No results found.   MDM   1. Abdominal pain, acute, epigastric    *59 year old female being transferred via shuttle to the emergency department for evaluation of progressive severe epigastric pain. She was administered a GI cocktail during preparation for her transfer.   Janne Napoleon, NP 10/06/14 [phone]

## 2014-10-06 NOTE — ED Notes (Signed)
Patient walked from waiting area in a free, fluid upright gait. C/o pain woke her at 4 am today, and has gotten gradually worse, now a "8" . No change in pain w pepcid, positioning . Looks uncomfortable

## 2014-10-06 NOTE — ED Notes (Signed)
She was sent from [facility] for further evaluation of severe epigastric abd pain radiating into lower abd that woke her from her sleep this am. No n/v/bowel/bladder changes. ucc gave her a GI cocktail with some pain relief

## 2014-10-08 ENCOUNTER — Ambulatory Visit (INDEPENDENT_AMBULATORY_CARE_PROVIDER_SITE_OTHER): Payer: 59 | Admitting: Physician Assistant

## 2014-10-08 ENCOUNTER — Encounter: Payer: Self-pay | Admitting: Physician Assistant

## 2014-10-08 VITALS — BP 124/70 | HR 68 | Ht 64.25 in | Wt 160.4 lb

## 2014-10-08 DIAGNOSIS — R110 Nausea: Secondary | ICD-10-CM

## 2014-10-08 DIAGNOSIS — R938 Abnormal findings on diagnostic imaging of other specified body structures: Secondary | ICD-10-CM

## 2014-10-08 DIAGNOSIS — R1013 Epigastric pain: Secondary | ICD-10-CM

## 2014-10-08 DIAGNOSIS — R9389 Abnormal findings on diagnostic imaging of other specified body structures: Secondary | ICD-10-CM

## 2014-10-08 NOTE — Patient Instructions (Signed)
Continue the Prilosec 20 mg, take 1 by mouth until the prescription is finished. Take no NSAIDS.( Aleve, Ibuprofen, Advil.).

## 2014-10-08 NOTE — Progress Notes (Signed)
Patient ID: [name], female   DOB: Nov 21, 1955, 59 y.o.   MRN: [id number]   Subjective:    Patient ID: [name], female    DOB: 1955/09/06, 59 y.o.   MRN: [id number]  HPI Ed is a pleasant 59 year old white female known to Dr. Ardis Hughs. She is in generally good health but had been diagnosed with an IPMN about a year ago, had EUS with Dr. Ardis Hughs and then a distal pancreatectomy with Dr. Barry Dienes in May 2015. Patient states she did well postoperatively and did not have any complications and has been feeling well. She had onset on Saturday morning about 4 AM of epigastric burning abdominal pain which awakened her from sleep. She says this was constant and lasted for several hours gradually worsening and associated with nausea. Her husband took her to the urgent care and from there she was transferred to the ER. After a GI cocktail and morphine her pain subsided and has not recurred. She says she ate very light for the rest of the day on Saturday and has felt fine yesterday and today. She has started on Prilosec 20 mg by mouth daily. Labs through the emergency room with normal CBC, lipase and hepatic panel. She also had CT scan of the abdomen and pelvis done which did show some hepatic steatosis, normal gallbladder normal remaining pancreas. There is an  18 mm circumscribed fluid collection anterior to the spleen and in the splenic hilum a 30 mm fluid collection. Also noted to have colonic diverticulosis. She was concerned about the cause of her epigastric pain and also about the CT findings. She does use NSAIDs periodically and had taken some Aleve last week she also had 2 glasses of wine on Friday evening.  Review of Systems Pertinent positive and negative review of systems were noted in the above HPI section.  All other review of systems was otherwise negative.  Outpatient Encounter Prescriptions as of 10/08/2014  Medication Sig  . ALPRAZolam (XANAX) 0.5 MG tablet Take 0.5 mg by mouth daily as  needed for anxiety.  . Artificial Tear Solution (SOOTHE HYDRATION) 1.25 % SOLN Apply 1 drop to eye at bedtime.  Marland Kitchen ascorbic acid (VITAMIN C) 1000 MG tablet Take 1,000 mg by mouth daily.  Marland Kitchen b complex vitamins tablet Take 1 tablet by mouth daily.  . Calcium Carb-Cholecalciferol (CALCIUM 600 + D PO) Take 1 tablet by mouth daily.  . carboxymethylcellulose (REFRESH PLUS) 0.5 % SOLN Place 1 drop into both eyes daily as needed (Dry eyes).   . Diclofenac Sodium 2 % SOLN Apply twice daily. (Patient taking differently: As needed)  . estradiol (VIVELLE-DOT) 0.1 MG/24HR patch Place 1.5 patches onto the skin 2 (two) times a week. Place 1.5 patches (0.1mg  + 0.05mg ) on abdomen on Wednesday and Sunday  of each week.  . fexofenadine-pseudoephedrine (ALLEGRA-D) 60-120 MG per tablet Take 1 tablet by mouth daily.  . fluticasone (FLONASE) 50 MCG/ACT nasal spray Place 1 spray into both nostrils daily.   . Lactobacillus (ACIDOPHILUS PO) Take 1 tablet by mouth daily.  . Melatonin 10 MG TABS Take 20 mg by mouth at bedtime.  . Misc Natural Products (TURMERIC CURCUMIN) CAPS Take 1 capsule by mouth daily.  . montelukast (SINGULAIR) 10 MG tablet Take 10 mg by mouth at bedtime.  . Multiple Vitamin (MULTIVITAMIN) tablet Take 1 tablet by mouth daily.  Marland Kitchen omeprazole (PRILOSEC) 20 MG capsule Take 1 capsule (20 mg total) by mouth daily.  . ondansetron (ZOFRAN ODT) 4 MG disintegrating  tablet 4mg  ODT q4 hours prn nausea/vomit  . oxyCODONE-acetaminophen (PERCOCET) 5-325 MG per tablet Take 1-2 tablets by mouth every 4 (four) hours as needed.  . [DISCONTINUED] acetaminophen (TYLENOL) 500 MG tablet Take 2 tablets (1,000 mg total) by mouth every 6 (six) hours as needed.  . [DISCONTINUED] Diclofenac Sodium 2 % SOLN Place 1 application onto the skin as needed (for pain).  . [DISCONTINUED] Ketoprofen POWD Ketoprofen 2.5% gel use on the affected area TID-QID  . [DISCONTINUED] meclizine (DRAMAMINE II) 25 MG tablet Take 25 mg by mouth daily.     . [DISCONTINUED] metoCLOPramide (REGLAN) 5 MG tablet Take 1 tablet (5 mg total) by mouth 3 (three) times daily before meals.  . [DISCONTINUED] naproxen sodium (ANAPROX) 220 MG tablet Take 440 mg by mouth 2 (two) times daily as needed (for pain).  . [DISCONTINUED] promethazine (PHENERGAN) 12.5 MG tablet Take 1 tablet (12.5 mg total) by mouth every 6 (six) hours as needed for nausea or vomiting (nausea).  . [DISCONTINUED] traMADol (ULTRAM) 50 MG tablet Take 1-2 tablets (50-100 mg total) by mouth every 6 (six) hours as needed for moderate pain or severe pain.   No Known Allergies Patient Active Problem List   Diagnosis Date Noted  . Pes anserine bursitis 05/11/2014  . Metatarsal stress fracture of right foot 03/27/2014  . Knee pain 01/10/2014  . IPMN (intraductal papillary mucinous neoplasm) s/p lap distal pancreatectomy 12/07/2013 11/27/2013  . Nonspecific (abnormal) findings on radiological and other examination of gastrointestinal tract 11/16/2013  . Abdominal pain 09/18/2013  . Postmenopausal bleeding 09/07/2013  . Nocturnal foot cramps 08/30/2013  . Subacromial bursitis 06/02/2013  . FOOT PAIN, RIGHT 05/06/2010  . HALLUX RIGIDUS, ACQUIRED 05/06/2010  . ABNORMALITY OF GAIT 05/06/2010   History   Social History  . Marital Status: Married    Spouse Name: N/A  . Number of Children: N/A  . Years of Education: N/A   Occupational History  .  [employer]   Social History Main Topics  . Smoking status: Former Smoker -- 1.00 packs/day for 10 years    Types: Cigarettes    Quit date: 01/02/1987  . Smokeless tobacco: Never Used  . Alcohol Use: Yes     Comment: socially  . Drug Use: No  . Sexual Activity: Not Currently    Birth Control/ Protection: Post-menopausal, Surgical   Other Topics Concern  . Not on file   Social History Narrative    Ms. Flenner's family history includes COPD in her mother; Cancer (age of onset: 76) in her mother.      Objective:     Filed Vitals:   10/08/14 1026  BP: 124/70  Pulse: 68    Physical Exam  well-developed white female in no acute distress, pleasant blood pressure 124/70 pulse 68 height 5 foot 4 weight 160. HEENT; nontraumatic normocephalic EOMI PERRLA sclera anicteric, Supple; no JVD, Cardiovascular; regular rate and rhythm with S1-S2 no murmur or gallop, Pulmonary; clear bilaterally, Abdomen; soft bowel sounds are present no palpable mass or hepatosplenomegaly she is basically nontender, Rectal ;exam not done, Ext; no clubbing cyanosis or edema skin warm and dry, Psych; mood and affect appropriate       Assessment & Plan:   #1  59 yo female with an episode of severe burning epigastric pain and nausea on 3/4- now subsided. Suspect this is related to gastritis, cannot r/o PUD #2 Hx of IPMN- s/p distal pancreatectomy 12/2013 #3 abnormal CT scan with 2 small  fluid collections ant to spleen and in splenic hilum. Etiology not clear- suspect post operative  Fluid   Plan; Continue Prilosec 20 mg daily x one month Avoid NSAIDS and ETOH Will ask Dr Ardis Hughs to review CT scan and advise  , and we will also communicate with  [facility]- may need repeat imaging in a few months   Angelica Wix S Casidy Alberta PA-C 10/08/2014   Cc: Prince Solian, MD

## 2014-10-09 ENCOUNTER — Telehealth: Payer: Self-pay

## 2014-10-09 NOTE — Telephone Encounter (Signed)
-----   Message from Alfredia Ferguson, PA-C sent at 10/09/2014 10:22 AM EST ----- Eustaquio Maize, please call pt and let her know Dr. Ardis Hughs reviewed her CT and feels these are post operative fluid collections, and doesn't feel they require any further workup . He forwrded a note to Dr. Barry Dienes also . ..Marland Kitchenthanks ----- Message -----    From: Milus Banister, MD    Sent: 10/09/2014   7:22 AM      To: Alfredia Ferguson, PA-C, Stark Klein, MD    ----- Message -----    From: Alfredia Ferguson, PA-C    Sent: 10/08/2014   1:01 PM      To: Milus Banister, MD

## 2014-10-09 NOTE — Telephone Encounter (Signed)
No answer. Did not leave a message. 

## 2014-10-09 NOTE — Telephone Encounter (Signed)
Patient advised.

## 2014-10-09 NOTE — Progress Notes (Signed)
I reviewed images and report. The fluid collections are almost certainly just seromas from 12/2013 distal pancreatectomy.  I don't think there is a need for surveillance imaging for these.  Will forward to Dr. Barry Dienes for her input as well. Otherwise I agree with the note, plan above.

## 2014-10-22 ENCOUNTER — Ambulatory Visit (INDEPENDENT_AMBULATORY_CARE_PROVIDER_SITE_OTHER): Payer: 59 | Admitting: Family Medicine

## 2014-10-22 ENCOUNTER — Other Ambulatory Visit (INDEPENDENT_AMBULATORY_CARE_PROVIDER_SITE_OTHER): Payer: 59

## 2014-10-22 ENCOUNTER — Encounter: Payer: Self-pay | Admitting: Family Medicine

## 2014-10-22 VITALS — BP 96/68 | HR 65 | Ht 64.25 in | Wt 162.0 lb

## 2014-10-22 DIAGNOSIS — M9903 Segmental and somatic dysfunction of lumbar region: Secondary | ICD-10-CM

## 2014-10-22 DIAGNOSIS — M999 Biomechanical lesion, unspecified: Secondary | ICD-10-CM | POA: Insufficient documentation

## 2014-10-22 DIAGNOSIS — M9901 Segmental and somatic dysfunction of cervical region: Secondary | ICD-10-CM

## 2014-10-22 DIAGNOSIS — T733XXA Exhaustion due to excessive exertion, initial encounter: Secondary | ICD-10-CM

## 2014-10-22 DIAGNOSIS — M9902 Segmental and somatic dysfunction of thoracic region: Secondary | ICD-10-CM

## 2014-10-22 DIAGNOSIS — M6289 Other specified disorders of muscle: Secondary | ICD-10-CM

## 2014-10-22 DIAGNOSIS — R5383 Other fatigue: Secondary | ICD-10-CM | POA: Insufficient documentation

## 2014-10-22 LAB — SEDIMENTATION RATE: Sed Rate: 9 mm/hr (ref 0–22)

## 2014-10-22 LAB — C-REACTIVE PROTEIN: CRP: 0.2 mg/dL — ABNORMAL LOW (ref 0.5–20.0)

## 2014-10-22 LAB — CORTISOL: Cortisol, Plasma: 5.2 ug/dL

## 2014-10-22 NOTE — Progress Notes (Signed)
Pre visit review using our clinic review tool, if applicable. No additional management support is needed unless otherwise documented below in the visit note. 

## 2014-10-22 NOTE — Assessment & Plan Note (Signed)
Patient's fatigue could be postviral first possibly or exertion. Patient has had a lot of stress recently as well that also could be contributing. I do think that any of these could be the possibility and I would like to rule out any infectious etiology and labs were drawn today. In addition to this we will look at any adrenal fatigue that could also be contribute in. I do think that any of these possibilities could also be giving patient's worsening joint pain recently. Patient is has to stay away from anti-inflammatories secondary to the gastroenterology pathology patient is doing with at this time. Patient will continue with the natural supplementations and then come back again in 3 weeks. We are doing the manipulation as well as well and we will repeat again in 2-3 weeks.

## 2014-10-22 NOTE — Patient Instructions (Addendum)
Good to see you We will get labs and I will release them to you.  Stretches after running for sure Continue the vitamins for now.  Tylenol 650mg  3 times a day See me again in 2-3 weeks.

## 2014-10-22 NOTE — Progress Notes (Signed)
  [name] [address] [place], [place] [address] Phone: [phone] Subjective:     CC:  Right foot pain follow up. Multiple joint pains  [name] is a 59 y.o. female coming in with complaint of right foot pain.  Patient continues to have some discomfort. Patient states that the pain is mostly on the medial aspect of the foot. Patient continues to have pain in her knee that seems to be going up toward her hip and having back pain as well. Patient has had recent illnesses as well for her stomach that is being followed by gastroenterology. Patient states that that's improving very slowly. Patient states that she is not feeling like herself in its been very fatigued. Patient has had some difficulty as well.  Past medical history, social, surgical and family history all reviewed in electronic medical record.   Review of Systems: No headache, visual changes, nausea, vomiting, diarrhea, constipation, dizziness, abdominal pain, skin rash, fevers, chills, night sweats, weight loss, swollen lymph nodes, body aches, joint swelling, muscle aches, chest pain, shortness of breath, mood changes.   Objective There were no vitals taken for this visit.  General: No apparent distress alert and oriented x3 mood and affect normal, dressed appropriately.  HEENT: Pupils equal, extraocular movements intact  Respiratory: Patient's speak in full sentences and does not appear short of breath  Cardiovascular: No lower extremity edema, non tender, no erythema  Skin: Warm dry intact with no signs of infection or rash on extremities or on axial skeleton.  Abdomen: Soft nontender  Neuro: Cranial nerves II through XII are intact, neurovascularly intact in all extremities with 2+ DTRs and 2+ pulses.  Lymph: No lymphadenopathy of posterior or anterior cervical chain or axillae bilaterally.  Gait normal with good balance and coordination.  MSK:  Non tender with full  range of motion and good stability and symmetric strength and tone of shoulders, elbows, wrist, hip,  and ankles bilaterally.  Foot Exam shows patient does have a bunion formation bilaterally greater on the right. Patient does have some mild hammering of the toes on the lateral aspect of the right foot #4 and 5. Patient does have mild collection of the longitudinal arch bilaterally. Improved callus formation on the plantar aspect of the feet. Continues to have hallux rigidus of the first toe but no significant formation noted.  Right knee exam shows the patient does have full range of motion but is minimally tender over the medial joint line as well as the pes anserine bursa. Mild tightness of the right hamstring compared to the contralateral side. Neurovascularly intact distally. Full strength compared to contralateral side.  Osteopathic findings Cervical C2 flexed rotated and side bent left C6 flexed rotated and side bent right  Thoracic T3 extended rotated and side bent right T8 extended rotated and side bent left  Lumbar L2 flexed rotated and side bent right  Sacrum Left on left   Impression and Recommendations:     This case required medical decision making of moderate complexity.

## 2014-10-22 NOTE — Assessment & Plan Note (Signed)
Decision today to treat with OMT was based on Physical Exam  After verbal consent patient was treated with FPR, ME, soft tissue techniques in cervical, thoracic, lumbar and sacral areas  Patient tolerated the procedure well with improvement in symptoms  Patient given exercises, stretches and lifestyle modifications  See medications in patient instructions if given  Patient will follow up in 2-3 weeks

## 2014-10-23 ENCOUNTER — Encounter: Payer: Self-pay | Admitting: Family Medicine

## 2014-10-23 LAB — VITAMIN D 25 HYDROXY (VIT D DEFICIENCY, FRACTURES): VITD: 33.8 ng/mL (ref 30.00–100.00)

## 2014-10-25 ENCOUNTER — Encounter: Payer: Self-pay | Admitting: Family Medicine

## 2014-10-25 MED ORDER — DICLOFENAC SODIUM 2 % TD SOLN: TRANSDERMAL | Status: DC

## 2014-10-26 LAB — DHEA: DHEA: 67 ng/dL — ABNORMAL LOW (ref 102–1185)

## 2014-10-29 ENCOUNTER — Ambulatory Visit (HOSPITAL_COMMUNITY)
Admission: RE | Admit: 2014-10-29 | Discharge: 2014-10-29 | Disposition: A | Discharge status: Home / Self Care | Payer: 59 | Source: Ambulatory Visit | Attending: Obstetrics & Gynecology | Admitting: Obstetrics & Gynecology

## 2014-10-29 DIAGNOSIS — Z1231 Encounter for screening mammogram for malignant neoplasm of breast: Secondary | ICD-10-CM | POA: Insufficient documentation

## 2015-01-03 ENCOUNTER — Encounter: Payer: Self-pay | Admitting: Family Medicine

## 2015-01-03 ENCOUNTER — Ambulatory Visit (INDEPENDENT_AMBULATORY_CARE_PROVIDER_SITE_OTHER): Payer: 59 | Admitting: Family Medicine

## 2015-01-03 VITALS — BP 102/70 | HR 110 | Wt 158.0 lb

## 2015-01-03 DIAGNOSIS — M9902 Segmental and somatic dysfunction of thoracic region: Secondary | ICD-10-CM

## 2015-01-03 DIAGNOSIS — M545 Low back pain, unspecified: Secondary | ICD-10-CM

## 2015-01-03 DIAGNOSIS — M999 Biomechanical lesion, unspecified: Secondary | ICD-10-CM

## 2015-01-03 DIAGNOSIS — M9901 Segmental and somatic dysfunction of cervical region: Secondary | ICD-10-CM

## 2015-01-03 DIAGNOSIS — M9903 Segmental and somatic dysfunction of lumbar region: Secondary | ICD-10-CM

## 2015-01-03 DIAGNOSIS — M25561 Pain in right knee: Secondary | ICD-10-CM

## 2015-01-03 NOTE — Assessment & Plan Note (Signed)
Decision today to treat with OMT was based on Physical Exam  After verbal consent patient was treated with FPR, ME, soft tissue techniques in cervical, thoracic, lumbar and sacral areas  Patient tolerated the procedure well with improvement in symptoms  Patient given exercises, stretches and lifestyle modifications  See medications in patient instructions if given  Patient will follow up in 3-4 weeks

## 2015-01-03 NOTE — Assessment & Plan Note (Signed)
Patient's pain in the medial knee and think is secondary to more of a sartorius muscle and possibly underlying arthritis of the right knee. We will hold on x-rays for now. Patient given a topical anti-inflammatory and patient will try a strap to avoids excessive load on the distal aspect of the sartorius. We discussed adjusting patient's bike seat to see if this will be beneficial. We discussed icing regimen as well. Patient try to make these changes and come back in 3-4 weeks. If continuing to have difficulty I would consider x-rays as well as possible injections for diagnostic and therapeutic purposes.

## 2015-01-03 NOTE — Patient Instructions (Addendum)
Great to see you [name] is your friend. Especially after activty and before bed pennsaid pinkie amount topically 2 times daily as needed.  Y-T-A exercises 60 seconds a day.  Sartorius muscle giving youi trouble Seat higher on bike Exercises 3 times a week.  See me again in 3 weeks.

## 2015-01-03 NOTE — Assessment & Plan Note (Signed)
Believe the patient's low back pain is secondary to tightness of the hip flexors. Patient does like cycling and I think that this is contribute in. Patient is going to change her bike seat and given some different exercises. We discussed the importance of core strengthening. Patient did respond well to osteopathic manipulation. Patient will come back again in 3-4 weeks for further evaluation and treatment.

## 2015-01-03 NOTE — Progress Notes (Signed)
[name] [place], [place] [address] Phone: [phone] Subjective:     CC:  Right knee pain, low back pain  [id number] [name] is a 59 y.o. female coming in with complaint of right knee pain. Patient states that this is more on the medial aspect. Hurts more with certain activities. Patient rates her bike and has noticed at the end of by grading it seems to be more tender. Patient has tried some ibuprofen with mild to moderate benefit. Patient states that it is not stopping her from any activity but is giving her more pain than she like. Patient states that sometimes he can feel unstable.. This and has had a couple episodes where felt like her knee was going to give out on her. Patient denies any nighttime awakening. Denies any radiation past the knee. Concerned because patient would like to remain active.  Patient is also complaining of low back pain. Patient has had this for some time. Patient is an avid cyclist and states that after cycling she can have increasing discomfort. Denies any radiation down the legs any numbness or tingling. Denies any fevers or chills or any abnormal weight loss. Nothing that stops her from her daily activity and still sleeping comfortably at night. Rates the severity of pain a 4 out of 10. Is more annoying than anything else.  Past medical history, social, surgical and family history all reviewed in electronic medical record.   Review of Systems: No headache, visual changes, nausea, vomiting, diarrhea, constipation, dizziness, abdominal pain, skin rash, fevers, chills, night sweats, weight loss, swollen lymph nodes, body aches, joint swelling, muscle aches, chest pain, shortness of breath, mood changes.   Objective Blood pressure 102/70, pulse 110, weight 158 lb (71.668 kg), SpO2 97 %.  General: No apparent distress alert and oriented x3 mood and affect normal, dressed appropriately.  HEENT: Pupils equal,  extraocular movements intact  Respiratory: Patient's speak in full sentences and does not appear short of breath  Cardiovascular: No lower extremity edema, non tender, no erythema  Skin: Warm dry intact with no signs of infection or rash on extremities or on axial skeleton.  Abdomen: Soft nontender  Neuro: Cranial nerves II through XII are intact, neurovascularly intact in all extremities with 2+ DTRs and 2+ pulses.  Lymph: No lymphadenopathy of posterior or anterior cervical chain or axillae bilaterally.  Gait normal with good balance and coordination.  MSK:  Non tender with full range of motion and good stability and symmetric strength and tone of shoulders, elbows, wrist, hip,  and ankles bilaterally.  .  Knee: Right Normal to inspection with no erythema or effusion or obvious bony abnormalities. Tender just inferior to the medial joint line ROM full in flexion and extension and lower leg rotation. Ligaments with solid consistent endpoints including ACL, PCL, LCL, MCL. No significant instability felt today. Negative Mcmurray's, Apley's, and Thessalonian tests. Mild painful patellar compression. Patellar glide with minimal crepitus. Patellar and quadriceps tendons unremarkable. Hamstring and quadriceps strength is normal.   Back Exam:  Inspection: Unremarkable  Motion: Flexion 45 deg, Extension 45 deg, Side Bending to 45 deg bilaterally,  Rotation to 45 deg bilaterally  SLR laying: Negative  XSLR laying: Negative  Palpable tenderness: Mild tenderness over the paraspinal musculature of the lumbar spine bilaterally FABER: Positive bilaterally but significant pain on the medial aspect of the knee on right side Sensory change: Gross sensation intact to all lumbar and sacral dermatomes.  Reflexes:  2+ at both patellar tendons, 2+ at achilles tendons, Babinski's downgoing.  Strength at foot  Plantar-flexion: 5/5 Dorsi-flexion: 5/5 Eversion: 5/5 Inversion: 5/5  Leg strength  Quad: 5/5  Hamstring: 5/5 Hip flexor: 5/5 Hip abductors: 4/5  Gait unremarkable.   Osteopathic findings Cervical C2 flexed rotated and side bent left C6 flexed rotated and side bent right  Thoracic T3 extended rotated and side bent right T8 extended rotated and side bent left  Lumbar L2 flexed rotated and side bent right  Sacrum Left on left   Impression and Recommendations:     This case required medical decision making of moderate complexity.

## 2015-01-30 ENCOUNTER — Ambulatory Visit: Payer: 59 | Admitting: Family Medicine

## 2015-02-08 ENCOUNTER — Encounter: Payer: Self-pay | Admitting: Family Medicine

## 2015-02-08 MED ORDER — DICLOFENAC SODIUM 2 % TD SOLN: TRANSDERMAL | Status: DC

## 2015-02-13 ENCOUNTER — Encounter: Payer: Self-pay | Admitting: Family Medicine

## 2015-02-13 MED ORDER — DICLOFENAC SODIUM 2 % TD SOLN: TRANSDERMAL | Status: DC

## 2015-03-06 ENCOUNTER — Other Ambulatory Visit: Payer: Self-pay

## 2015-03-07 ENCOUNTER — Other Ambulatory Visit: Payer: Self-pay

## 2015-03-08 ENCOUNTER — Other Ambulatory Visit: Payer: Self-pay

## 2015-03-08 DIAGNOSIS — D490 Neoplasm of unspecified behavior of digestive system: Secondary | ICD-10-CM

## 2015-03-08 NOTE — Addendum Note (Signed)
Addended by: Stark Klein on: 03/08/2015 05:11 PM   Modules accepted: Orders

## 2015-03-29 ENCOUNTER — Other Ambulatory Visit: Payer: Self-pay

## 2015-03-29 DIAGNOSIS — D490 Neoplasm of unspecified behavior of digestive system: Secondary | ICD-10-CM

## 2015-04-04 ENCOUNTER — Other Ambulatory Visit: Payer: Self-pay | Admitting: General Surgery

## 2015-04-11 ENCOUNTER — Other Ambulatory Visit: Payer: Self-pay | Admitting: General Surgery

## 2015-04-11 ENCOUNTER — Other Ambulatory Visit: Payer: Self-pay

## 2015-04-11 DIAGNOSIS — D490 Neoplasm of unspecified behavior of digestive system: Secondary | ICD-10-CM

## 2015-04-22 ENCOUNTER — Ambulatory Visit
Admission: RE | Admit: 2015-04-22 | Discharge: 2015-04-22 | Disposition: A | Discharge status: Home / Self Care | Payer: 59 | Source: Ambulatory Visit | Attending: General Surgery | Admitting: General Surgery

## 2015-04-22 DIAGNOSIS — D490 Neoplasm of unspecified behavior of digestive system: Secondary | ICD-10-CM

## 2015-04-22 MED ORDER — GADOBENATE DIMEGLUMINE 529 MG/ML IV SOLN
14.0000 mL | Freq: Once | INTRAVENOUS | Status: AC | PRN
  Administered 2015-04-22: 14 mL via INTRAVENOUS

## 2015-04-22 NOTE — Progress Notes (Signed)
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 Please let patient know. ______

## 2015-08-20 ENCOUNTER — Encounter: Payer: Self-pay | Admitting: Family Medicine

## 2015-08-20 MED FILL — CICLOPIROX 8% SOLUTION: 8 | 20 days supply | Qty: 7 | Fill #0

## 2015-09-02 DIAGNOSIS — M6281 Muscle weakness (generalized): Secondary | ICD-10-CM | POA: Diagnosis not present

## 2015-09-02 DIAGNOSIS — N393 Stress incontinence (female) (male): Secondary | ICD-10-CM | POA: Diagnosis not present

## 2015-09-02 DIAGNOSIS — R278 Other lack of coordination: Secondary | ICD-10-CM | POA: Diagnosis not present

## 2015-09-05 ENCOUNTER — Telehealth: Payer: Self-pay | Admitting: Family Medicine

## 2015-09-05 NOTE — Telephone Encounter (Signed)
Patient is having shoulder pain.  I have scheduled her for Monday.  She is requesting to come in sooner if possible.

## 2015-09-05 NOTE — Telephone Encounter (Signed)
Sorry not in office on Friday .  Tell her if I see her at the gym I can help but otherwise Monday .

## 2015-09-09 ENCOUNTER — Other Ambulatory Visit (INDEPENDENT_AMBULATORY_CARE_PROVIDER_SITE_OTHER): Payer: 59

## 2015-09-09 ENCOUNTER — Ambulatory Visit (INDEPENDENT_AMBULATORY_CARE_PROVIDER_SITE_OTHER): Payer: 59 | Admitting: Family Medicine

## 2015-09-09 ENCOUNTER — Encounter: Payer: Self-pay | Admitting: Family Medicine

## 2015-09-09 VITALS — BP 118/70 | HR 75 | Ht 64.25 in | Wt 157.0 lb

## 2015-09-09 DIAGNOSIS — M2170 Unequal limb length (acquired), unspecified site: Secondary | ICD-10-CM | POA: Diagnosis not present

## 2015-09-09 DIAGNOSIS — M25551 Pain in right hip: Secondary | ICD-10-CM | POA: Diagnosis not present

## 2015-09-09 DIAGNOSIS — M7062 Trochanteric bursitis, left hip: Secondary | ICD-10-CM | POA: Insufficient documentation

## 2015-09-09 DIAGNOSIS — M7061 Trochanteric bursitis, right hip: Secondary | ICD-10-CM | POA: Diagnosis not present

## 2015-09-09 MED ORDER — DICLOFENAC SODIUM 2 % TD SOLN: TRANSDERMAL | Status: DC

## 2015-09-09 NOTE — Progress Notes (Signed)
[name] [address] [place], [place] [address] Phone: [phone] Subjective:     CC:  Right-sided hip pain  [id number] [name] is a 60 y.o. female coming in with complaint of right sided hip pain. States it's on the lateral aspect. New problem. Has not had in the past. Feels different than her back pain she had previously. States that it seems to be waking her up at night. Stays localized. No pain at the knee. States though that it does seem to change her gait. Patient has been working out regularly and has been doing a lot of stretches and has noticed that this may have exacerbated somewhat. Denies any new injuries. Rates the severity of pain a 6 out of 10. Going out of town for a skiing trip and would like to be able to participate.  Past medical history, social, surgical and family history all reviewed in electronic medical record.   Review of Systems: No headache, visual changes, nausea, vomiting, diarrhea, constipation, dizziness, abdominal pain, skin rash, fevers, chills, night sweats, weight loss, swollen lymph nodes, body aches, joint swelling, muscle aches, chest pain, shortness of breath, mood changes.   Objective Blood pressure 118/70, pulse 75, height 5' 4.25" (1.632 m), weight 157 lb (71.215 kg), SpO2 98 %.  General: No apparent distress alert and oriented x3 mood and affect normal, dressed appropriately.  HEENT: Pupils equal, extraocular movements intact  Respiratory: Patient's speak in full sentences and does not appear short of breath  Cardiovascular: No lower extremity edema, non tender, no erythema  Skin: Warm dry intact with no signs of infection or rash on extremities or on axial skeleton.  Abdomen: Soft nontender  Neuro: Cranial nerves II through XII are intact, neurovascularly intact in all extremities with 2+ DTRs and 2+ pulses.  Lymph: No lymphadenopathy of posterior or anterior cervical chain or axillae bilaterally.    Gait normal with good balance and coordination.  MSK:  Non tender with full range of motion and good stability and symmetric strength and tone of shoulders, elbows, wrist, hip,  and ankles bilaterally.  .  .   Back Exam:  Inspection: Unremarkable  Motion: Flexion 45 deg, Extension 45 deg, Side Bending to 45 deg bilaterally,  Rotation to 45 deg bilaterally  SLR laying: Negative  XSLR laying: Negative  Palpable tenderness: tender over the greater trochanteric area on the right side FABER: positive right side Sensory change: Gross sensation intact to all lumbar and sacral dermatomes.  Reflexes: 2+ at both patellar tendons, 2+ at achilles tendons, Babinski's downgoing.  Strength at foot  Plantar-flexion: 5/5 Dorsi-flexion: 5/5 Eversion: 5/5 Inversion: 5/5  Leg strength  Quad: 5/5 Hamstring: 5/5 Hip flexor: 5/5 Hip abductors: 4/5  Gait unremarkable. Patient does have a mild leg length discrepancy on the right side.  MSK US performed of: Right This study was ordered, performed, and interpreted by [name] D.O.  Hip: Trochanteric bursa with significant hypoechoic changes and swelling Acetabular labrum visualized and without tears, displacement, or effusion in joint. Femoral neck appears unremarkable without increased power doppler signal along Cortex.  IMPRESSION:  Greater trochanter bursitis   Procedure: Real-time Ultrasound Guided Injection of right greater trochanteric bursitis secondary to patient's body habitus Device: GE Logiq E  Ultrasound guided injection is preferred based studies that show increased duration, increased effect, greater accuracy, decreased procedural pain, increased response rate, and decreased cost with ultrasound guided versus blind injection.  Verbal informed consent obtained.  Time-out conducted.  Noted no overlying erythema, induration, or other signs of local infection.  Skin prepped in a sterile fashion.  Local anesthesia: Topical Ethyl chloride.   With sterile technique and under real time ultrasound guidance:  Greater trochanteric area was visualized and patient's bursa was noted. A 22-gauge 3 inch needle was inserted and 4 cc of 0.5% Marcaine and 1 cc of Kenalog 40 mg/dL was injected. Pictures taken Completed without difficulty  Pain immediately resolved suggesting accurate placement of the medication.  Advised to call if fevers/chills, erythema, induration, drainage, or persistent bleeding.  Images permanently stored and available for review in the ultrasound unit.  Impression: Technically successful ultrasound guided injection.    Impression and Recommendations:     This case required medical decision making of moderate complexity.

## 2015-09-09 NOTE — Assessment & Plan Note (Signed)
Patient given injection today overall doing well. Patient did respond well. Given home exercises, icing and topical anti-inflammatories were prescribed. Patient will come back in 3-4 weeks for further evaluation.

## 2015-09-09 NOTE — Progress Notes (Signed)
Pre visit review using our clinic review tool, if applicable. No additional management support is needed unless otherwise documented below in the visit note. 

## 2015-09-09 NOTE — Assessment & Plan Note (Signed)
Encouraged patient to wear heel lift on the right side. Discussed icing and home exercise. Discussed which activities to do an which ones to avoid. We discussed proper shoewear. Will return in 3 weeks.

## 2015-09-09 NOTE — Patient Instructions (Signed)
Good to see you  Ice 20 minutes 2 times daily. Usually after activity and before bed. Exercises 3 times a week.  Stretch at the end of the day  Exercises on wall.  Heel and butt touching.  Raise leg 6 inches and hold 2 seconds.  Down slow for count of 4 seconds.  1 set of 30 reps daily on both sides.  pennsaid pinkie amount topically 2 times daily as needed.  See me again in 3-4 weeks if not perfect

## 2015-09-13 MED FILL — ESTRADIOL 0.1 MG PATCH: 0.1 | 84 days supply | Qty: 24 | Fill #3

## 2015-09-24 DIAGNOSIS — N393 Stress incontinence (female) (male): Secondary | ICD-10-CM | POA: Diagnosis not present

## 2015-09-27 ENCOUNTER — Other Ambulatory Visit: Payer: Self-pay

## 2015-09-27 DIAGNOSIS — Z1231 Encounter for screening mammogram for malignant neoplasm of breast: Secondary | ICD-10-CM

## 2015-09-27 MED FILL — MONTELUKAST SOD 10 MG TAB: 10 | 90 days supply | Qty: 90 | Fill #1

## 2015-10-02 DIAGNOSIS — Z85828 Personal history of other malignant neoplasm of skin: Secondary | ICD-10-CM | POA: Diagnosis not present

## 2015-10-02 DIAGNOSIS — D2222 Melanocytic nevi of left ear and external auricular canal: Secondary | ICD-10-CM | POA: Diagnosis not present

## 2015-10-02 DIAGNOSIS — L821 Other seborrheic keratosis: Secondary | ICD-10-CM | POA: Diagnosis not present

## 2015-10-02 DIAGNOSIS — B351 Tinea unguium: Secondary | ICD-10-CM | POA: Diagnosis not present

## 2015-10-02 DIAGNOSIS — D225 Melanocytic nevi of trunk: Secondary | ICD-10-CM | POA: Diagnosis not present

## 2015-10-02 DIAGNOSIS — L814 Other melanin hyperpigmentation: Secondary | ICD-10-CM | POA: Diagnosis not present

## 2015-10-02 MED FILL — JUBLIA 10% TOPICAL SOLUTION: 10 | 20 days supply | Qty: 4 | Fill #0

## 2015-10-22 ENCOUNTER — Other Ambulatory Visit (INDEPENDENT_AMBULATORY_CARE_PROVIDER_SITE_OTHER): Payer: 59

## 2015-10-22 ENCOUNTER — Encounter: Payer: Self-pay | Admitting: Family Medicine

## 2015-10-22 ENCOUNTER — Ambulatory Visit (INDEPENDENT_AMBULATORY_CARE_PROVIDER_SITE_OTHER): Payer: 59 | Admitting: Family Medicine

## 2015-10-22 VITALS — BP 120/78 | HR 58 | Ht 64.25 in | Wt 157.0 lb

## 2015-10-22 DIAGNOSIS — M545 Low back pain, unspecified: Secondary | ICD-10-CM

## 2015-10-22 DIAGNOSIS — M9903 Segmental and somatic dysfunction of lumbar region: Secondary | ICD-10-CM | POA: Diagnosis not present

## 2015-10-22 DIAGNOSIS — M25562 Pain in left knee: Secondary | ICD-10-CM

## 2015-10-22 DIAGNOSIS — S8981XA Other specified injuries of right lower leg, initial encounter: Secondary | ICD-10-CM

## 2015-10-22 DIAGNOSIS — M9902 Segmental and somatic dysfunction of thoracic region: Secondary | ICD-10-CM | POA: Diagnosis not present

## 2015-10-22 DIAGNOSIS — M9901 Segmental and somatic dysfunction of cervical region: Secondary | ICD-10-CM

## 2015-10-22 DIAGNOSIS — S838X1A Sprain of other specified parts of right knee, initial encounter: Secondary | ICD-10-CM | POA: Insufficient documentation

## 2015-10-22 DIAGNOSIS — M999 Biomechanical lesion, unspecified: Secondary | ICD-10-CM

## 2015-10-22 NOTE — Assessment & Plan Note (Signed)
Patient does have an injury to the medial meniscus seems to be doing better. We discussed icing regimen and home exercises. We discussed which activities to avoid. This is been 4 weeks ago and I do not see any significant displacement and I think patient will do well with conservative therapy. Do not feel that any other imaging is ordered at this time. Patient is following up in 3 weeks and we'll make sure that she is responding well.

## 2015-10-22 NOTE — Assessment & Plan Note (Signed)
Decision today to treat with OMT was based on Physical Exam  After verbal consent patient was treated with FPR, ME, soft tissue techniques in cervical, thoracic, lumbar and sacral areas  Patient tolerated the procedure well with improvement in symptoms  Patient given exercises, stretches and lifestyle modifications  See medications in patient instructions if given  Patient will follow up in 3-4 weeks    

## 2015-10-22 NOTE — Assessment & Plan Note (Signed)
Low back pain. We discussed be multifactorial and likely secondary to more muscle imbalances. We discussed which activities to do an which was to avoid. Patient will come back and see me again in 3-4 weeks. Responded well to osteopathic manipulation.

## 2015-10-22 NOTE — Progress Notes (Signed)
Pre visit review using our clinic review tool, if applicable. No additional management support is needed unless otherwise documented below in the visit note. 

## 2015-10-22 NOTE — Patient Instructions (Signed)
Good to see you  Likely a meniscal tear but is healing Arthritis of right knee but will be ok Ice 20 minutes 2 times daily. Usually after activity and before bed. Avoid squatting AND twisting.  Compression sleeve to left knee can help pennsaid pinkie amount topically 2 times daily as needed.  Exercises on wall.  Heel and butt touching.  Raise leg 6 inches and hold 2 seconds.  Down slow for count of 4 seconds.  1 set of 30 reps daily on both sides.  See me again in 3 weeks for another round of manipulation.

## 2015-10-22 NOTE — Progress Notes (Signed)
[name] [place], [place] [address] Phone: [phone] Subjective:    Skiing a few weeks ago.left knee pain CC:   [id number] [name] is a 60 y.o. female coming in with complaint of left knee pain. Patient though when she was skiing. Had some mild swelling. Pain is now on the medial and lateral aspects of the knee. Has been slowly getting better. States with twisting motion she does have a severe pain. Patient states it lasts seconds and then seems to go away. Patient states when she tries to fully flex sitting yoga he can also give her pain. Patient states daily activities of it seems to be doing well. Denies any radiation down the leg, denies any instability. Denies any nighttime awakening.  Complaining of also low back pain. Has had this for years. Seems to be getting worse. Did respond once to osteopathic manipulation a long time ago. Patient has been working out with a Clinical research associate for multiple months. Once continue to do so. Been happy with the results but discuss some stress on the back. No radiation down the legs any numbness or tingling. No weakness. Does not remember any specific injury.  Past Medical History  Diagnosis Date  . SVD (spontaneous vaginal delivery)     x 1  . Seasonal allergies   . Fibroids 09/07/2013  . Anemia     on iron  . PONV (postoperative nausea and vomiting)     non recent   . Cancer ([place])     skin cancer  . S/P hysterectomy with oophorectomy 09/07/2013  . Diverticulosis     CT   Past Surgical History  Procedure Laterality Date  . Colonoscopy      2 or 3  . Dilation and curettage of uterus      x 3  . Wisdom tooth extraction    . Eye surgery      lasik bilateral  . Robotic assisted total hysterectomy with bilateral salpingo oopherectomy Bilateral 09/07/2013    Procedure: ROBOTIC ASSISTED TOTAL HYSTERECTOMY WITH BILATERAL SALPINGO OOPHORECTOMY;  Surgeon: Elveria Royals, MD;  Location: [facility];   Service: Gynecology;  Laterality: Bilateral;  . Laparoscopy N/A 09/25/2013    Procedure: LAPAROSCOPY OPERATIVE;  Surgeon: Elveria Royals, MD;  Location: [facility];  Service: Gynecology;  Laterality: N/A;  . Eus N/A 11/16/2013    Procedure: UPPER ENDOSCOPIC ULTRASOUND (EUS) LINEAR;  Surgeon: Milus Banister, MD;  Location: [facility];  Service: Endoscopy;  Laterality: N/A;  . Abdominal hysterectomy    . Pancreatectomy N/A 12/07/2013    Procedure: LAPAROSCOPIC DISTAL  PANCREATECTOMY;  Surgeon: Stark Klein, MD;  Location: [facility];  Service: General;  Laterality: N/A;   Social History  Substance Use Topics  . Smoking status: Former Smoker -- 1.00 packs/day for 10 years    Types: Cigarettes    Quit date: 01/02/1987  . Smokeless tobacco: Never Used  . Alcohol Use: Yes     Comment: socially   No Known Allergies Family History  Problem Relation Age of Onset  . Cancer Mother 53    colon,lung   . COPD Mother      Past medical history, social, surgical and family history all reviewed in electronic medical record.   Review of Systems: No headache, visual changes, nausea, vomiting, diarrhea, constipation, dizziness, abdominal pain, skin rash, fevers, chills, night sweats, weight loss, swollen lymph nodes, body aches, joint swelling, muscle aches, chest pain, shortness of breath, mood  changes.   Objective Blood pressure 120/78, pulse 58, height 5' 4.25" (1.632 m), weight 157 lb (71.215 kg), SpO2 97 %.  General: No apparent distress alert and oriented x3 mood and affect normal, dressed appropriately.  HEENT: Pupils equal, extraocular movements intact  Respiratory: Patient's speak in full sentences and does not appear short of breath  Cardiovascular: No lower extremity edema, non tender, no erythema  Skin: Warm dry intact with no signs of infection or rash on extremities or on axial skeleton.  Abdomen: Soft nontender  Neuro: Cranial nerves II through XII are intact, neurovascularly intact in all  extremities with 2+ DTRs and 2+ pulses.  Lymph: No lymphadenopathy of posterior or anterior cervical chain or axillae bilaterally.  Gait normal with good balance and coordination.  MSK:  Non tender with full range of motion and good stability and symmetric strength and tone of shoulders, elbows, wrist, hip,  and ankles bilaterally.  .  .   Back Exam:  Inspection: Unremarkable  Motion: Flexion 45 deg, Extension 45 deg, Side Bending to 45 deg bilaterally,  Rotation to 45 deg bilaterally  SLR laying: Negative  XSLR laying: Negative  Palpable tenderness: Mild tenderness over the paraspinal musculature of the lumbar spine mostly right-sided FABER: positive right side Sensory change: Gross sensation intact to all lumbar and sacral dermatomes.  Reflexes: 2+ at both patellar tendons, 2+ at achilles tendons, Babinski's downgoing.  Strength at foot  Plantar-flexion: 5/5 Dorsi-flexion: 5/5 Eversion: 5/5 Inversion: 5/5  Leg strength  Quad: 5/5 Hamstring: 5/5 Hip flexor: 5/5 Hip abductors: 4/5  Gait unremarkable.  Knee:left Normal to inspection with no erythema or effusion or obvious bony abnormalities. Palpation normal with no warmth, joint line tenderness, patellar tenderness, or condyle tenderness. ROM full in flexion and extension and lower leg rotation. Ligaments with solid consistent endpoints including ACL, PCL, LCL, MCL. Mild positiveMcmurray's, Apley's, and Thessalonian tests. Non painful patellar compression. Patellar glide without crepitus. Patellar and quadriceps tendons unremarkable. Hamstring and quadriceps strength is normal.   Contralateral knee has some mild medial joint line tenderness as well.  MSK US performed of: left knee This study was ordered, performed, and interpreted by [name] D.O.  Knee: All structures visualized. Patient does have what appears to be a very small tear within the posterior medial meniscus. No hypoechoic changes noted. No significant  displacement. Mild increase in Doppler flow Patellar Tendon unremarkable on long and transverse views without effusion. No abnormality of prepatellar bursa. LCL and MCL unremarkable on long and transverse views. No abnormality of origin of medial or lateral head of the gastrocnemius. Mild hypoechoic changes around the popliteal tendon in the fossa  IMPRESSION:  Posterior medial meniscus nondisplaced minimal hypoechoic changes  Osteopathic findings T3 extended rotated inside that right L2 flexed rotated and side bent right Sacrum left on left   Impression and Recommendations:     This case required medical decision making of moderate complexity.

## 2015-10-29 ENCOUNTER — Ambulatory Visit (INDEPENDENT_AMBULATORY_CARE_PROVIDER_SITE_OTHER): Payer: 59 | Admitting: Sports Medicine

## 2015-10-29 ENCOUNTER — Encounter: Payer: Self-pay | Admitting: Sports Medicine

## 2015-10-29 VITALS — BP 134/56 | HR 59 | Ht 66.0 in | Wt 153.0 lb

## 2015-10-29 DIAGNOSIS — M2020 Hallux rigidus, unspecified foot: Secondary | ICD-10-CM

## 2015-10-29 DIAGNOSIS — R269 Unspecified abnormalities of gait and mobility: Secondary | ICD-10-CM | POA: Diagnosis not present

## 2015-10-29 NOTE — Assessment & Plan Note (Signed)
This has been corrected and responded well to orthotics  Today I repaired orthotics that are 59 years old RT first ray post RT and LT small MT pads  These were comfortable and she should wear for running We will consider some support for dress shoes

## 2015-10-29 NOTE — Progress Notes (Signed)
Patient ID: [name], female   DOB: 29-Mar-1956, 59 y.o.   MRN: [id number]  CC: pain over toes in yoga  Patient known to me after I put her into orthotics several years ago Orthotics have helped her stay active and very little foot pain with walking or running Now gets pain with positions in Yoga while on her toes Also pain with dorsifexion stretch of ankle and foot  Wonders if orthotics are OK Wonders if she can cont these exercise  Fam Hx: no Hx of gout/ possible hx of bunions  ROS Now with some medial knee pain and sees Dr Creig Hines for this Denies swelling of other joints No redness or swelling of eithet great toe  EXAM Nad BP 134/56 mmHg  Pulse 59  Ht 5\' 6"  (1.676 m)  Wt 153 lb (69.4 kg)  BMI 24.71 kg/m2  Loss of long arch bilat RT great toe with some valgus deviation MTP spurring bilat MTP 1 Still has preserved motion of MTP with mild limitation bilat Loss of long arch bilat has gradually progressed  Walking gait with no support is pronated Yoga foot positions are painful

## 2015-10-29 NOTE — Assessment & Plan Note (Signed)
Motion is preserved from 3 years ago  However yoga positions cause too much pain and should be avoided  Early bunion change starting on RT

## 2015-10-30 ENCOUNTER — Ambulatory Visit
Admission: RE | Admit: 2015-10-30 | Discharge: 2015-10-30 | Disposition: A | Discharge status: Home / Self Care | Payer: 59 | Source: Ambulatory Visit

## 2015-10-30 DIAGNOSIS — Z1231 Encounter for screening mammogram for malignant neoplasm of breast: Secondary | ICD-10-CM | POA: Diagnosis not present

## 2015-11-04 DIAGNOSIS — R278 Other lack of coordination: Secondary | ICD-10-CM | POA: Diagnosis not present

## 2015-11-04 DIAGNOSIS — N393 Stress incontinence (female) (male): Secondary | ICD-10-CM | POA: Diagnosis not present

## 2015-11-04 DIAGNOSIS — M6281 Muscle weakness (generalized): Secondary | ICD-10-CM | POA: Diagnosis not present

## 2015-11-12 ENCOUNTER — Encounter: Payer: Self-pay | Admitting: Family Medicine

## 2015-11-12 ENCOUNTER — Ambulatory Visit (INDEPENDENT_AMBULATORY_CARE_PROVIDER_SITE_OTHER): Payer: 59 | Admitting: Family Medicine

## 2015-11-12 VITALS — BP 120/68 | HR 60 | Ht 64.25 in | Wt 155.0 lb

## 2015-11-12 DIAGNOSIS — M9902 Segmental and somatic dysfunction of thoracic region: Secondary | ICD-10-CM | POA: Diagnosis not present

## 2015-11-12 DIAGNOSIS — M9903 Segmental and somatic dysfunction of lumbar region: Secondary | ICD-10-CM

## 2015-11-12 DIAGNOSIS — M545 Low back pain, unspecified: Secondary | ICD-10-CM

## 2015-11-12 DIAGNOSIS — M9901 Segmental and somatic dysfunction of cervical region: Secondary | ICD-10-CM

## 2015-11-12 DIAGNOSIS — S838X1A Sprain of other specified parts of right knee, initial encounter: Secondary | ICD-10-CM

## 2015-11-12 DIAGNOSIS — M999 Biomechanical lesion, unspecified: Secondary | ICD-10-CM

## 2015-11-12 DIAGNOSIS — S8981XA Other specified injuries of right lower leg, initial encounter: Secondary | ICD-10-CM

## 2015-11-12 NOTE — Assessment & Plan Note (Signed)
Decision today to treat with OMT was based on Physical Exam  After verbal consent patient was treated with FPR, ME, soft tissue techniques in cervical, thoracic, lumbar and sacral areas  Patient tolerated the procedure well with improvement in symptoms  Patient given exercises, stretches and lifestyle modifications  See medications in patient instructions if given  Patient will follow up in 4-6 weeks

## 2015-11-12 NOTE — Patient Instructions (Addendum)
Good to see you  Ice is your friend New exercises for hip flexor if you need them after gardening.  Exercises on wall.  Heel and butt touching.  Raise leg 6 inches and hold 2 seconds.  Down slow for count of 4 seconds.  1 set of 30 reps daily on both sides.  COntinue the vitamins.  Bromelain 2400-2500 GCU with each meal See me again in 4-6 weeks.

## 2015-11-12 NOTE — Assessment & Plan Note (Signed)
Patient is making improvement at this time. Encourage her to continue the exercises. Still avoid any type of flexing and twisting at the same time of the knee. I do believe the patient should be near pain-free in the next 4-6 weeks. Patient will follow-up at that time. Has not pain-free we'll consider re-ultrasounding as well as the potential for advanced imaging.

## 2015-11-12 NOTE — Progress Notes (Signed)
Pre visit review using our clinic review tool, if applicable. No additional management support is needed unless otherwise documented below in the visit note. 

## 2015-11-12 NOTE — Progress Notes (Signed)
[name] [address] [place], [place] [address] Phone: [phone] Subjective:    Skiing a few weeks ago.left knee pain f/u CC:   [id number] [name] is a 60 y.o. female coming in with complaint of left knee pain. Patient was found to have a medial meniscal tear. Seem to be nondisplaced. Did respond fairly well to exercises. States that she is about 70% better. States that overall seems to do well until she has full flexion of the knee. No swelling noted. No locking. Able to do daily activities. Some mild discomfort when she is doing yard work.  Complaining of also low back pain.  Patient has had tightness overall. Patient does have a history of pancreatic cancer but states that she has not had any fevers, chills, any abnormal weight loss. Seems to be worse when she is in a flexed position for a long amount of time. Has been responding fairly well to osteopathic manipulation. Patient continues to be active working out 5-7 times a week. Chest read her bike very regularly. Some mild discomfort when she is doing yoga. No pain at night.   Past Medical History  Diagnosis Date  . SVD (spontaneous vaginal delivery)     x 1  . Seasonal allergies   . Fibroids 09/07/2013  . Anemia     on iron  . PONV (postoperative nausea and vomiting)     non recent   . Cancer ([facility])     skin cancer  . S/P hysterectomy with oophorectomy 09/07/2013  . Diverticulosis     CT   Past Surgical History  Procedure Laterality Date  . Colonoscopy      2 or 3  . Dilation and curettage of uterus      x 3  . Wisdom tooth extraction    . Eye surgery      lasik bilateral  . Robotic assisted total hysterectomy with bilateral salpingo oopherectomy Bilateral 09/07/2013    Procedure: ROBOTIC ASSISTED TOTAL HYSTERECTOMY WITH BILATERAL SALPINGO OOPHORECTOMY;  Surgeon: Elveria Royals, MD;  Location: [facility];  Service: Gynecology;  Laterality: Bilateral;  . Laparoscopy N/A 09/25/2013   Procedure: LAPAROSCOPY OPERATIVE;  Surgeon: Elveria Royals, MD;  Location: [facility];  Service: Gynecology;  Laterality: N/A;  . Eus N/A 11/16/2013    Procedure: UPPER ENDOSCOPIC ULTRASOUND (EUS) LINEAR;  Surgeon: Milus Banister, MD;  Location: [facility];  Service: Endoscopy;  Laterality: N/A;  . Abdominal hysterectomy    . Pancreatectomy N/A 12/07/2013    Procedure: LAPAROSCOPIC DISTAL  PANCREATECTOMY;  Surgeon: Stark Klein, MD;  Location: [facility];  Service: General;  Laterality: N/A;   Social History  Substance Use Topics  . Smoking status: Former Smoker -- 1.00 packs/day for 10 years    Types: Cigarettes    Quit date: 01/02/1987  . Smokeless tobacco: Never Used  . Alcohol Use: Yes     Comment: socially   No Known Allergies Family History  Problem Relation Age of Onset  . Cancer Mother 78    colon,lung   . COPD Mother      Past medical history, social, surgical and family history all reviewed in electronic medical record.   Review of Systems: No headache, visual changes, nausea, vomiting, diarrhea, constipation, dizziness, abdominal pain, skin rash, fevers, chills, night sweats, weight loss, swollen lymph nodes, body aches, joint swelling, muscle aches, chest pain, shortness of breath, mood changes.   Objective Blood pressure 120/68, pulse 60, height 5'  4.25" (1.632 m), weight 155 lb (70.308 kg), SpO2 98 %.  General: No apparent distress alert and oriented x3 mood and affect normal, dressed appropriately.  HEENT: Pupils equal, extraocular movements intact  Respiratory: Patient's speak in full sentences and does not appear short of breath  Cardiovascular: No lower extremity edema, non tender, no erythema  Skin: Warm dry intact with no signs of infection or rash on extremities or on axial skeleton.  Abdomen: Soft nontender  Neuro: Cranial nerves II through XII are intact, neurovascularly intact in all extremities with 2+ DTRs and 2+ pulses.  Lymph: No lymphadenopathy of posterior  or anterior cervical chain or axillae bilaterally.  Gait normal with good balance and coordination.  MSK:  Non tender with full range of motion and good stability and symmetric strength and tone of shoulders, elbows, wrist, hip,  and ankles bilaterally.  .  .   Back Exam:  Inspection: Unremarkable  Motion: Flexion 45 deg, Extension 25 deg, Side Bending to 45 deg bilaterally,  Rotation to 45 deg bilaterally  SLR laying: Negative  XSLR laying: Negative  Palpable tenderness: Mild increase in tenderness over the paraspinal musculature of the lumbar spine FABER: positive right side Sensory change: Gross sensation intact to all lumbar and sacral dermatomes.  Reflexes: 2+ at both patellar tendons, 2+ at achilles tendons, Babinski's downgoing.  Strength at foot  Plantar-flexion: 5/5 Dorsi-flexion: 5/5 Eversion: 5/5 Inversion: 5/5  Leg strength  Quad: 5/5 Hamstring: 5/5 Hip flexor: 5/5 Hip abductors: 4/5  Gait unremarkable.  Knee:left Normal to inspection with no erythema or effusion or obvious bony abnormalities. Mild discomfort over the posterior medial joint line ROM full in flexion and extension and lower leg rotation. Ligaments with solid consistent endpoints including ACL, PCL, LCL, MCL. negativeMcmurray's, Apley's, and Thessalonian tests. Non painful patellar compression. Patellar glide without crepitus. Patellar and quadriceps tendons unremarkable. Hamstring and quadriceps strength is normal.  Contralateral knee unremarkable.    Osteopathic findings T3 extended rotated inside that right L2 flexed rotated and side bent right Sacrum left on left   Impression and Recommendations:     This case required medical decision making of moderate complexity.

## 2015-11-12 NOTE — Assessment & Plan Note (Signed)
Continued tightness of the hip flexors bilaterally. I do think the patient has some sacroiliac dysfunction to plantar all. Overall though I do not think that any new x-rays are necessary. No fevers, chills, any abnormal weight loss. Continues respond well to osteopathic manipulation. We discussed icing regimen. Given another handout of exercises. Patient will come back and see me again in 4-6 weeks for further evaluation.

## 2015-12-11 ENCOUNTER — Encounter: Payer: Self-pay | Admitting: Family Medicine

## 2015-12-11 ENCOUNTER — Ambulatory Visit (INDEPENDENT_AMBULATORY_CARE_PROVIDER_SITE_OTHER): Payer: 59 | Admitting: Family Medicine

## 2015-12-11 VITALS — BP 104/72 | HR 68

## 2015-12-11 DIAGNOSIS — M9901 Segmental and somatic dysfunction of cervical region: Secondary | ICD-10-CM

## 2015-12-11 DIAGNOSIS — M9902 Segmental and somatic dysfunction of thoracic region: Secondary | ICD-10-CM | POA: Diagnosis not present

## 2015-12-11 DIAGNOSIS — M545 Low back pain, unspecified: Secondary | ICD-10-CM

## 2015-12-11 DIAGNOSIS — M9903 Segmental and somatic dysfunction of lumbar region: Secondary | ICD-10-CM | POA: Diagnosis not present

## 2015-12-11 DIAGNOSIS — M999 Biomechanical lesion, unspecified: Secondary | ICD-10-CM

## 2015-12-11 NOTE — Patient Instructions (Signed)
Good to see you  Ice when you  Need it Add ankle weight to the hip abductors Watch the head on the planks Continue the vitamins Have a great time See me again in 4-6 weeks

## 2015-12-11 NOTE — Progress Notes (Signed)
[name] [address] [place], [place] [address] Phone: [phone] Subjective:    Skiing a few weeks ago.left knee pain f/u CC:   [id number] LATIVIA WENDORF is a 60 y.o. female coming in with complaint of left knee pain. Patient was found to have a medial meniscal tear. Seem to be nondisplaced. About 90% better. Working on a regular basis. No severe pain.  Complaining of also low back pain.  Patient has had tightness overall. Patient does have a history of pancreatic cancer but states that she has not had any fevers, chills, any abnormal weight loss. Responded well to osteopathic manipulation. Still some mild tightness from time to time but nothing that seems to be severe.   Past Medical History  Diagnosis Date  . SVD (spontaneous vaginal delivery)     x 1  . Seasonal allergies   . Fibroids 09/07/2013  . Anemia     on iron  . PONV (postoperative nausea and vomiting)     non recent   . Cancer ([facility])     skin cancer  . S/P hysterectomy with oophorectomy 09/07/2013  . Diverticulosis     CT   Past Surgical History  Procedure Laterality Date  . Colonoscopy      2 or 3  . Dilation and curettage of uterus      x 3  . Wisdom tooth extraction    . Eye surgery      lasik bilateral  . Robotic assisted total hysterectomy with bilateral salpingo oopherectomy Bilateral 09/07/2013    Procedure: ROBOTIC ASSISTED TOTAL HYSTERECTOMY WITH BILATERAL SALPINGO OOPHORECTOMY;  Surgeon: Elveria Royals, MD;  Location: [facility];  Service: Gynecology;  Laterality: Bilateral;  . Laparoscopy N/A 09/25/2013    Procedure: LAPAROSCOPY OPERATIVE;  Surgeon: Elveria Royals, MD;  Location: [facility];  Service: Gynecology;  Laterality: N/A;  . Eus N/A 11/16/2013    Procedure: UPPER ENDOSCOPIC ULTRASOUND (EUS) LINEAR;  Surgeon: Milus Banister, MD;  Location: [facility];  Service: Endoscopy;  Laterality: N/A;  . Abdominal hysterectomy    . Pancreatectomy N/A 12/07/2013    Procedure:  LAPAROSCOPIC DISTAL  PANCREATECTOMY;  Surgeon: Stark Klein, MD;  Location: [facility];  Service: General;  Laterality: N/A;   Social History  Substance Use Topics  . Smoking status: Former Smoker -- 1.00 packs/day for 10 years    Types: Cigarettes    Quit date: 01/02/1987  . Smokeless tobacco: Never Used  . Alcohol Use: Yes     Comment: socially   No Known Allergies Family History  Problem Relation Age of Onset  . Cancer Mother 7    colon,lung   . COPD Mother      Past medical history, social, surgical and family history all reviewed in electronic medical record.   Review of Systems: No headache, visual changes, nausea, vomiting, diarrhea, constipation, dizziness, abdominal pain, skin rash, fevers, chills, night sweats, weight loss, swollen lymph nodes, body aches, joint swelling, muscle aches, chest pain, shortness of breath, mood changes.   Objective Blood pressure 104/72, pulse 68, SpO2 95 %.  General: No apparent distress alert and oriented x3 mood and affect normal, dressed appropriately.  HEENT: Pupils equal, extraocular movements intact  Respiratory: Patient's speak in full sentences and does not appear short of breath  Cardiovascular: No lower extremity edema, non tender, no erythema  Skin: Warm dry intact with no signs of infection or rash on extremities or on axial skeleton.  Abdomen: Soft  nontender  Neuro: Cranial nerves II through XII are intact, neurovascularly intact in all extremities with 2+ DTRs and 2+ pulses.  Lymph: No lymphadenopathy of posterior or anterior cervical chain or axillae bilaterally.  Gait normal with good balance and coordination.  MSK:  Non tender with full range of motion and good stability and symmetric strength and tone of shoulders, elbows, wrist, hip,  and ankles bilaterally.  .  .   Back Exam:  Inspection: Unremarkable  Motion: Flexion 45 deg, Extension 25 deg, Side Bending to 45 deg bilaterally,  Rotation to 45 deg bilaterally  SLR  laying: Negative  XSLR laying: Negative  Palpable tenderness: Continued tenderness and appears palmar musculature of the lumbar spine FABER: Negative today which is an improvement Sensory change: Gross sensation intact to all lumbar and sacral dermatomes.  Reflexes: 2+ at both patellar tendons, 2+ at achilles tendons, Babinski's downgoing.  Strength at foot  Plantar-flexion: 5/5 Dorsi-flexion: 5/5 Eversion: 5/5 Inversion: 5/5  Leg strength  Quad: 5/5 Hamstring: 5/5 Hip flexor: 5/5 Hip abductors: 4/5  Gait unremarkable.  Knee:left Normal to inspection with no erythema or effusion or obvious bony abnormalities. Nontender today ROM full in flexion and extension and lower leg rotation. Ligaments with solid consistent endpoints including ACL, PCL, LCL, MCL. negativeMcmurray's, Apley's, and Thessalonian tests. Non painful patellar compression. Patellar glide without crepitus. Patellar and quadriceps tendons unremarkable. Hamstring and quadriceps strength is normal.  Contralateral knee unremarkable.    Osteopathic findings T3 extended rotated inside that right L2 flexed rotated and side bent right Sacrum left on left   Impression and Recommendations:     This case required medical decision making of moderate complexity.

## 2015-12-11 NOTE — Assessment & Plan Note (Signed)
Patient's low back pain seems to be tight overall. Secondary to more of the ankle for instability from muscle imbalances. Patient different exercises and strengthening exercises that I think will be beneficial. Patient continues to work out on a regular basis. We discussed with patient about icing protocol. Patient come back and see me again in 4-6 weeks.

## 2015-12-11 NOTE — Assessment & Plan Note (Signed)
Decision today to treat with OMT was based on Physical Exam  After verbal consent patient was treated with FPR, ME, soft tissue techniques in cervical, thoracic, lumbar and sacral areas  Patient tolerated the procedure well with improvement in symptoms  Patient given exercises, stretches and lifestyle modifications  See medications in patient instructions if given  Patient will follow up in 4-6 weeks

## 2015-12-11 NOTE — Progress Notes (Signed)
Pre visit review using our clinic review tool, if applicable. No additional management support is needed unless otherwise documented below in the visit note. 

## 2015-12-12 MED FILL — ESTRADIOL 0.1 MG PATCH: 0.1 | 84 days supply | Qty: 24 | Fill #4

## 2016-01-03 MED FILL — MONTELUKAST SOD 10 MG TAB: 10 | 90 days supply | Qty: 90 | Fill #2

## 2016-01-15 ENCOUNTER — Encounter: Payer: Self-pay | Admitting: Family Medicine

## 2016-01-15 ENCOUNTER — Ambulatory Visit (INDEPENDENT_AMBULATORY_CARE_PROVIDER_SITE_OTHER): Payer: 59 | Admitting: Family Medicine

## 2016-01-15 VITALS — BP 116/62 | HR 73 | Ht 64.25 in

## 2016-01-15 DIAGNOSIS — M999 Biomechanical lesion, unspecified: Secondary | ICD-10-CM

## 2016-01-15 DIAGNOSIS — M9903 Segmental and somatic dysfunction of lumbar region: Secondary | ICD-10-CM

## 2016-01-15 DIAGNOSIS — M9902 Segmental and somatic dysfunction of thoracic region: Secondary | ICD-10-CM

## 2016-01-15 DIAGNOSIS — M545 Low back pain, unspecified: Secondary | ICD-10-CM

## 2016-01-15 DIAGNOSIS — M9901 Segmental and somatic dysfunction of cervical region: Secondary | ICD-10-CM

## 2016-01-15 NOTE — Assessment & Plan Note (Signed)
Decision today to treat with OMT was based on Physical Exam  After verbal consent patient was treated with FPR, ME, soft tissue techniques in cervical, thoracic, lumbar and sacral areas  Patient tolerated the procedure well with improvement in symptoms  Patient given exercises, stretches and lifestyle modifications  See medications in patient instructions if given  Patient will follow up in 4-6 weeks   Decision today to treat with OMT was based on Physical Exam  After verbal consent patient was treated with FPR, ME, soft tissue techniques in cervical, thoracic, lumbar and sacral areas  Patient tolerated the procedure well with improvement in symptoms  Patient given exercises, stretches and lifestyle modifications  See medications in patient instructions if given  Patient will follow up in 4-6 weeks

## 2016-01-15 NOTE — Progress Notes (Signed)
[name] [address] [place], [place] [address] Phone: [phone] Subjective:    Skiing a few weeks ago.left knee pain f/u CC:   [id number] [name] is a 60 y.o. female coming in with complaint of left knee pain. Patient was found to have a medial meniscal tear. Was 90% better. Patient was to continue conservative therapy. Patient statesCompletely healed at this time and not giving her any pain.  Complaining of also low back pain.  Patient has had tightness overall. Patient does have a history of pancreatic cancer but states that she has not had any fevers, chills, any abnormal weight loss. Responded well to osteopathic manipulation. Still noticing tightness. Has been biking more often and his notices that this seems to aggravate it. Patient will be starting to have a bite fitting which she hopes will be more helpful.   Past Medical History  Diagnosis Date  . SVD (spontaneous vaginal delivery)     x 1  . Seasonal allergies   . Fibroids 09/07/2013  . Anemia     on iron  . PONV (postoperative nausea and vomiting)     non recent   . Cancer ([facility])     skin cancer  . S/P hysterectomy with oophorectomy 09/07/2013  . Diverticulosis     CT   Past Surgical History  Procedure Laterality Date  . Colonoscopy      2 or 3  . Dilation and curettage of uterus      x 3  . Wisdom tooth extraction    . Eye surgery      lasik bilateral  . Robotic assisted total hysterectomy with bilateral salpingo oopherectomy Bilateral 09/07/2013    Procedure: ROBOTIC ASSISTED TOTAL HYSTERECTOMY WITH BILATERAL SALPINGO OOPHORECTOMY;  Surgeon: Elveria Royals, MD;  Location: [facility];  Service: Gynecology;  Laterality: Bilateral;  . Laparoscopy N/A 09/25/2013    Procedure: LAPAROSCOPY OPERATIVE;  Surgeon: Elveria Royals, MD;  Location: [facility];  Service: Gynecology;  Laterality: N/A;  . Eus N/A 11/16/2013    Procedure: UPPER ENDOSCOPIC ULTRASOUND (EUS) LINEAR;  Surgeon:  Milus Banister, MD;  Location: [facility];  Service: Endoscopy;  Laterality: N/A;  . Abdominal hysterectomy    . Pancreatectomy N/A 12/07/2013    Procedure: LAPAROSCOPIC DISTAL  PANCREATECTOMY;  Surgeon: Stark Klein, MD;  Location: [facility];  Service: General;  Laterality: N/A;   Social History  Substance Use Topics  . Smoking status: Former Smoker -- 1.00 packs/day for 10 years    Types: Cigarettes    Quit date: 01/02/1987  . Smokeless tobacco: Never Used  . Alcohol Use: Yes     Comment: socially   No Known Allergies Family History  Problem Relation Age of Onset  . Cancer Mother 64    colon,lung   . COPD Mother      Past medical history, social, surgical and family history all reviewed in electronic medical record.   Review of Systems: No headache, visual changes, nausea, vomiting, diarrhea, constipation, dizziness, abdominal pain, skin rash, fevers, chills, night sweats, weight loss, swollen lymph nodes, body aches, joint swelling, muscle aches, chest pain, shortness of breath, mood changes.   Objective There were no vitals taken for this visit.  General: No apparent distress alert and oriented x3 mood and affect normal, dressed appropriately.  HEENT: Pupils equal, extraocular movements intact  Respiratory: Patient's speak in full sentences and does not appear short of breath  Cardiovascular: No lower extremity edema, non  tender, no erythema  Skin: Warm dry intact with no signs of infection or rash on extremities or on axial skeleton.  Abdomen: Soft nontender  Neuro: Cranial nerves II through XII are intact, neurovascularly intact in all extremities with 2+ DTRs and 2+ pulses.  Lymph: No lymphadenopathy of posterior or anterior cervical chain or axillae bilaterally.  Gait normal with good balance and coordination.  MSK:  Non tender with full range of motion and good stability and symmetric strength and tone of shoulders, elbows, wrist, hip,  and ankles bilaterally.  .  .    Back Exam:  Inspection: Unremarkable  Motion: Flexion 45 deg, Extension 25 deg, Side Bending to 35 deg bilaterally,  Rotation to 35 deg bilaterally mild increasing tightness SLR laying: Negative  XSLR laying: Negative  Palpable tenderness: Mild worsening tightness of the per spinal musculature of the lumbar spine mild tightness in the hip flexors as well. FABER: Negative today which is an improvement Sensory change: Gross sensation intact to all lumbar and sacral dermatomes.  Reflexes: 2+ at both patellar tendons, 2+ at achilles tendons, Babinski's downgoing.  Strength at foot  Plantar-flexion: 5/5 Dorsi-flexion: 5/5 Eversion: 5/5 Inversion: 5/5  Leg strength  Quad: 5/5 Hamstring: 5/5 Hip flexor: 5/5 Hip abductors: 4/5  Gait unremarkable.    Osteopathic findings T3 extended rotated inside that right L2 flexed rotated and side bent right L4 flexed rotated and side bent left Sacrum left on left   Impression and Recommendations:     This case required medical decision making of moderate complexity.

## 2016-01-15 NOTE — Progress Notes (Signed)
Pre visit review using our clinic review tool, if applicable. No additional management support is needed unless otherwise documented below in the visit note. 

## 2016-01-15 NOTE — Assessment & Plan Note (Signed)
Continues to have tightness of the hip flexors. We discussed icing regimen and home exercises. We discussed over-the-counter medications a could be beneficial. I do think that with patient biking on a more regular basis she shouldn't have her bike fitted. We discussed different stretches and encourage her to do them on a more regular basis. Patient will follow-up with me again in 4-6 weeks. If continuing have pain I would like to get x-rays of the lower back.

## 2016-01-15 NOTE — Patient Instructions (Signed)
Good to see you  [name] is your friend  Stay active Work on stretching the hip flexor after riding I like the idea of message.  I would also get the bike fitted.  See me again in 4 weeks.

## 2016-01-20 ENCOUNTER — Encounter: Payer: Self-pay | Admitting: Family Medicine

## 2016-01-27 ENCOUNTER — Ambulatory Visit (INDEPENDENT_AMBULATORY_CARE_PROVIDER_SITE_OTHER): Payer: 59 | Admitting: Family Medicine

## 2016-01-27 ENCOUNTER — Telehealth: Payer: Self-pay | Admitting: Family Medicine

## 2016-01-27 ENCOUNTER — Ambulatory Visit (INDEPENDENT_AMBULATORY_CARE_PROVIDER_SITE_OTHER)
Admission: RE | Admit: 2016-01-27 | Discharge: 2016-01-27 | Disposition: A | Discharge status: Home / Self Care | Payer: 59 | Source: Ambulatory Visit | Attending: Family Medicine | Admitting: Family Medicine

## 2016-01-27 ENCOUNTER — Other Ambulatory Visit: Payer: Self-pay

## 2016-01-27 ENCOUNTER — Encounter: Payer: Self-pay | Admitting: Family Medicine

## 2016-01-27 VITALS — BP 122/74 | HR 78 | Wt 159.0 lb

## 2016-01-27 DIAGNOSIS — M79671 Pain in right foot: Secondary | ICD-10-CM

## 2016-01-27 DIAGNOSIS — M7751 Other enthesopathy of right foot: Secondary | ICD-10-CM

## 2016-01-27 DIAGNOSIS — M19071 Primary osteoarthritis, right ankle and foot: Secondary | ICD-10-CM | POA: Diagnosis not present

## 2016-01-27 NOTE — Telephone Encounter (Signed)
Spoke to pt, scheduled her for 4pm today.

## 2016-01-27 NOTE — Patient Instructions (Signed)
Good to see you  Consider a toe spacer Do not lace the last eye and on your bike shoes leave the toe velcro open if you can.  Ice, ice, ice baby.  Shoes with a rigid bottom.  Soul calm down over the next days otherwise give me a call.  See me again As needed for this and I am sure I will see you for your back soon.

## 2016-01-27 NOTE — Progress Notes (Signed)
[name] [place], [place] [address] Phone: [phone] Subjective:     CC:  Right foot pain follow up.   [id number] [name] is a 60 y.o. female coming in with complaint of right foot pain.  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 Patient when walking in the city with flats for 3 hours and has severe amount of pain. Having swelling. Has been about 7 days. Does not seem to be improving. Rates the severity of pain is 8 out of 10. Has tried icing and anti-inflammatory's with very mild improvement.  Past Medical History  Diagnosis Date  . SVD (spontaneous vaginal delivery)     x 1  . Seasonal allergies   . Fibroids 09/07/2013  . Anemia     on iron  . PONV (postoperative nausea and vomiting)     non recent   . Cancer ([place])     skin cancer  . S/P hysterectomy with oophorectomy 09/07/2013  . Diverticulosis     CT   Past Surgical History  Procedure Laterality Date  . Colonoscopy      2 or 3  . Dilation and curettage of uterus      x 3  . Wisdom tooth extraction    . Eye surgery      lasik bilateral  . Robotic assisted total hysterectomy with bilateral salpingo oopherectomy Bilateral 09/07/2013    Procedure: ROBOTIC ASSISTED TOTAL HYSTERECTOMY WITH BILATERAL SALPINGO OOPHORECTOMY;  Surgeon: Elveria Royals, MD;  Location: [facility];  Service: Gynecology;  Laterality: Bilateral;  . Laparoscopy N/A 09/25/2013    Procedure: LAPAROSCOPY OPERATIVE;  Surgeon: Elveria Royals, MD;  Location: [facility];  Service: Gynecology;  Laterality: N/A;  . Eus N/A 11/16/2013    Procedure: UPPER ENDOSCOPIC ULTRASOUND (EUS) LINEAR;  Surgeon: Milus Banister, MD;  Location: [facility];  Service: Endoscopy;  Laterality: N/A;  . Abdominal hysterectomy    . Pancreatectomy N/A 12/07/2013    Procedure: LAPAROSCOPIC DISTAL  PANCREATECTOMY;  Surgeon: Stark Klein, MD;  Location: [facility];  Service: General;  Laterality: N/A;     Social History  Substance Use Topics  . Smoking status: Former Smoker -- 1.00 packs/day for 10 years    Types: Cigarettes    Quit date: 01/02/1987  . Smokeless tobacco: Never Used  . Alcohol Use: Yes     Comment: socially   No Known Allergies Family History  Problem Relation Age of Onset  . Cancer Mother 31    colon,lung   . COPD Mother      Past medical history, social, surgical and family history all reviewed in electronic medical record.   Review of Systems: No headache, visual changes, nausea, vomiting, diarrhea, constipation, dizziness, abdominal pain, skin rash, fevers, chills, night sweats, weight loss, swollen lymph nodes, body aches, joint swelling, muscle aches, chest pain, shortness of breath, mood changes.   Objective Blood pressure 122/74, pulse 78, weight 159 lb (72.122 kg).  General: No apparent distress alert and oriented x3 mood and affect normal, dressed appropriately.  HEENT: Pupils equal, extraocular movements intact  Respiratory: Patient's speak in full sentences and does not appear short of breath  Cardiovascular: No lower extremity edema, non tender, no erythema  Skin: Warm dry intact with no signs of infection or rash on extremities or on axial skeleton.  Abdomen: Soft nontender  Neuro: Cranial nerves II through XII are intact, neurovascularly intact  in all extremities with 2+ DTRs and 2+ pulses.  Lymph: No lymphadenopathy of posterior or anterior cervical chain or axillae bilaterally.  Gait normal with good balance and coordination.  MSK:  Non tender with full range of motion and good stability and symmetric strength and tone of shoulders, elbows, wrist, hip,  and ankles bilaterally.  Foot Exam shows patient does have a bunion formation bilaterally greater on the right. Patient does have some mild hammering of the toes on the lateral aspect of the right foot #4 and 5. Patient does have mild collection of the longitudinal arch bilaterally. Improved callus  formation on the plantar aspect of the feet. Continues to have hallux rigidus of the first toe mild increase in bunion formation recently. Patient also has some mild swelling of feels like of the joint or effusion.  Procedure: Real-time Ultrasound Guided Injection of first MTP joint Device: GE Logiq E  Ultrasound guided injection is preferred based studies that show increased duration, increased effect, greater accuracy, decreased procedural pain, increased response rate, and decreased cost with ultrasound guided versus blind injection.  Verbal informed consent obtained.  Time-out conducted.  Noted no overlying erythema, induration, or other signs of local infection.  Skin prepped in a sterile fashion.  Local anesthesia: Topical Ethyl chloride.  With sterile technique and under real time ultrasound guidance:  With a 25-gauge half-inch needle patient was injected with a total of 0.5 mL of 0.5% Marcaine and 0.5 mL of Kenalog 40 mg/dL Completed without difficulty  Pain immediately resolved suggesting accurate placement of the medication.  Advised to call if fevers/chills, erythema, induration, drainage, or persistent bleeding.  Images permanently stored and available for review in the ultrasound unit.  Impression: Technically successful ultrasound guided injection.     Impression and Recommendations:     This case required medical decision making of moderate complexity.

## 2016-01-27 NOTE — Assessment & Plan Note (Signed)
Given injection today. We discussed the possibility of a spacer. We discussed icing regimen and topical anti-inflammatories. We discussed proper lacing of shoes in which she is a be more beneficial. Discussed ergonomics throughout the day. Follow-up again in 4 weeks.

## 2016-01-27 NOTE — Telephone Encounter (Signed)
Patient states her foot is in a lot of pain and would like to be worked in.

## 2016-01-30 DIAGNOSIS — Z0000 Encounter for general adult medical examination without abnormal findings: Secondary | ICD-10-CM | POA: Diagnosis not present

## 2016-02-07 ENCOUNTER — Ambulatory Visit: Payer: 59 | Admitting: Family Medicine

## 2016-02-11 DIAGNOSIS — Z800 Family history of malignant neoplasm of digestive organs: Secondary | ICD-10-CM | POA: Diagnosis not present

## 2016-02-11 DIAGNOSIS — N393 Stress incontinence (female) (male): Secondary | ICD-10-CM | POA: Diagnosis not present

## 2016-02-11 DIAGNOSIS — G4700 Insomnia, unspecified: Secondary | ICD-10-CM | POA: Diagnosis not present

## 2016-02-11 DIAGNOSIS — D490 Neoplasm of unspecified behavior of digestive system: Secondary | ICD-10-CM | POA: Diagnosis not present

## 2016-02-11 DIAGNOSIS — M1990 Unspecified osteoarthritis, unspecified site: Secondary | ICD-10-CM | POA: Diagnosis not present

## 2016-02-11 DIAGNOSIS — E784 Other hyperlipidemia: Secondary | ICD-10-CM | POA: Diagnosis not present

## 2016-02-11 DIAGNOSIS — Z0000 Encounter for general adult medical examination without abnormal findings: Secondary | ICD-10-CM | POA: Diagnosis not present

## 2016-02-11 DIAGNOSIS — M5380 Other specified dorsopathies, site unspecified: Secondary | ICD-10-CM | POA: Diagnosis not present

## 2016-02-11 DIAGNOSIS — J309 Allergic rhinitis, unspecified: Secondary | ICD-10-CM | POA: Diagnosis not present

## 2016-02-11 MED FILL — FLUTICASONE PROP 50 MCG SPR: 50 | 90 days supply | Qty: 48 | Fill #1

## 2016-02-11 MED FILL — FLUCONAZOLE 150 MG TABLET: 150 | 1 days supply | Qty: 1 | Fill #1

## 2016-02-12 ENCOUNTER — Encounter: Payer: Self-pay | Admitting: Family Medicine

## 2016-02-12 ENCOUNTER — Ambulatory Visit (INDEPENDENT_AMBULATORY_CARE_PROVIDER_SITE_OTHER): Payer: 59 | Admitting: Family Medicine

## 2016-02-12 VITALS — BP 112/72 | HR 62 | Wt 154.0 lb

## 2016-02-12 DIAGNOSIS — M9901 Segmental and somatic dysfunction of cervical region: Secondary | ICD-10-CM | POA: Diagnosis not present

## 2016-02-12 DIAGNOSIS — M2020 Hallux rigidus, unspecified foot: Secondary | ICD-10-CM | POA: Diagnosis not present

## 2016-02-12 DIAGNOSIS — M9902 Segmental and somatic dysfunction of thoracic region: Secondary | ICD-10-CM

## 2016-02-12 DIAGNOSIS — M545 Low back pain, unspecified: Secondary | ICD-10-CM

## 2016-02-12 DIAGNOSIS — M999 Biomechanical lesion, unspecified: Secondary | ICD-10-CM

## 2016-02-12 DIAGNOSIS — M7751 Other enthesopathy of right foot: Secondary | ICD-10-CM

## 2016-02-12 DIAGNOSIS — M9903 Segmental and somatic dysfunction of lumbar region: Secondary | ICD-10-CM

## 2016-02-12 MED ORDER — VITAMIN D (ERGOCALCIFEROL) 1.25 MG (50000 UNIT) PO CAPS: 50000.0000 [IU] | ORAL_CAPSULE | ORAL | Status: DC

## 2016-02-12 MED FILL — VIT D2 1.25 MG (50,000 UNIT: 1.25 MG | 84 days supply | Qty: 12 | Fill #0

## 2016-02-12 NOTE — Progress Notes (Signed)
[name] [place], [place] [address] Phone: [phone] Subjective:     CC:  Right foot pain follow up. Low back pain follow-up  [id number] [name] is a 60 y.o. female coming in with complaint of right foot pain.  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 Patient was given an injection. States that she is feeling 80% better. Still some mild discomfort but was able to run the other day. First time she is running many months.  Patient is having some very mild low back pain. Has responded very well to osteopathic manipulation. No radiation of pain at the moment. Seems to be stable. Mild increase in tightness may be since she started running.  Past Medical History  Diagnosis Date  . SVD (spontaneous vaginal delivery)     x 1  . Seasonal allergies   . Fibroids 09/07/2013  . Anemia     on iron  . PONV (postoperative nausea and vomiting)     non recent   . Cancer ([facility])     skin cancer  . S/P hysterectomy with oophorectomy 09/07/2013  . Diverticulosis     CT   Past Surgical History  Procedure Laterality Date  . Colonoscopy      2 or 3  . Dilation and curettage of uterus      x 3  . Wisdom tooth extraction    . Eye surgery      lasik bilateral  . Robotic assisted total hysterectomy with bilateral salpingo oopherectomy Bilateral 09/07/2013    Procedure: ROBOTIC ASSISTED TOTAL HYSTERECTOMY WITH BILATERAL SALPINGO OOPHORECTOMY;  Surgeon: Elveria Royals, MD;  Location: [facility];  Service: Gynecology;  Laterality: Bilateral;  . Laparoscopy N/A 09/25/2013    Procedure: LAPAROSCOPY OPERATIVE;  Surgeon: Elveria Royals, MD;  Location: [facility];  Service: Gynecology;  Laterality: N/A;  . Eus N/A 11/16/2013    Procedure: UPPER ENDOSCOPIC ULTRASOUND (EUS) LINEAR;  Surgeon: Milus Banister, MD;  Location: [facility];  Service: Endoscopy;  Laterality: N/A;  . Abdominal hysterectomy    . Pancreatectomy  N/A 12/07/2013    Procedure: LAPAROSCOPIC DISTAL  PANCREATECTOMY;  Surgeon: Stark Klein, MD;  Location: [facility];  Service: General;  Laterality: N/A;   Social History  Substance Use Topics  . Smoking status: Former Smoker -- 1.00 packs/day for 10 years    Types: Cigarettes    Quit date: 01/02/1987  . Smokeless tobacco: Never Used  . Alcohol Use: Yes     Comment: socially   No Known Allergies Family History  Problem Relation Age of Onset  . Cancer Mother 90    colon,lung   . COPD Mother      Past medical history, social, surgical and family history all reviewed in electronic medical record.   Review of Systems: No headache, visual changes, nausea, vomiting, diarrhea, constipation, dizziness, abdominal pain, skin rash, fevers, chills, night sweats, weight loss, swollen lymph nodes, body aches, joint swelling, muscle aches, chest pain, shortness of breath, mood changes.   Objective Blood pressure 112/72, pulse 62, weight 154 lb (69.854 kg).  General: No apparent distress alert and oriented x3 mood and affect normal, dressed appropriately.  HEENT: Pupils equal, extraocular movements intact  Respiratory: Patient's speak in full sentences and does not appear short of breath  Cardiovascular: No lower extremity edema, non tender, no erythema  Skin: Warm dry intact with no signs of infection or  rash on extremities or on axial skeleton.  Abdomen: Soft nontender  Neuro: Cranial nerves II through XII are intact, neurovascularly intact in all extremities with 2+ DTRs and 2+ pulses.  Lymph: No lymphadenopathy of posterior or anterior cervical chain or axillae bilaterally.  Gait normal with good balance and coordination.  MSK:  Non tender with full range of motion and good stability and symmetric strength and tone of shoulders, elbows, wrist, hip,  and ankles bilaterally.  Foot Exam shows patient does have a bunion formation bilaterally greater on the right. Patient does have some mild hammer.  Patient does have some mild hallux rigidus. Significant less tenderness than previous exam.   Back Exam:  Inspection: Unremarkable  Motion: Flexion 45 deg, Extension 25 deg, Side Bending to 35 deg bilaterally,  Rotation to 35 deg bilaterally mild increasing tightness SLR laying: Negative  XSLR laying: Negative  Palpable tenderness: Nontender on exam today FABER: Mild tightness noted bilaterally Sensory change: Gross sensation intact to all lumbar and sacral dermatomes.  Reflexes: 2+ at both patellar tendons, 2+ at achilles tendons, Babinski's downgoing.  Strength at foot  Plantar-flexion: 5/5 Dorsi-flexion: 5/5 Eversion: 5/5 Inversion: 5/5  Leg strength  Quad: 5/5 Hamstring: 5/5 Hip flexor: 5/5 Hip abductors: 4/5  Gait unremarkable.   Osteopathic findings T3 extended rotated inside that right T7 extended rotated and side bent left L2 flexed rotated and side bent right L4 flexed rotated and side bent left Sacrum left on left    Impression and Recommendations:     This case required medical decision making of moderate complexity.

## 2016-02-12 NOTE — Assessment & Plan Note (Signed)
Decision today to treat with OMT was based on Physical Exam  After verbal consent patient was treated with FPR, ME, soft tissue techniques in cervical, thoracic, lumbar and sacral areas  Patient tolerated the procedure well with improvement in symptoms  Patient given exercises, stretches and lifestyle modifications  See medications in patient instructions if given  Patient will follow up in 6 weeks

## 2016-02-12 NOTE — Patient Instructions (Signed)
Good to see you  Try to make a nurse visit up front for the shingles vaccine.  Your back is amazing and I think the toe is close.  Once weekly vitamin D instead of the daily  Keep it up  See me again in 6 weeks.

## 2016-02-12 NOTE — Assessment & Plan Note (Signed)
Significant improvement. Patient continues to increase her activity. Patient is having less and less pain. I do not see any signs for any further imaging. Patient is responding well to osteopathic manipulation. Follow-up again in 6 weeks.

## 2016-02-12 NOTE — Assessment & Plan Note (Signed)
Resolve after injection. 

## 2016-02-12 NOTE — Assessment & Plan Note (Signed)
Better after injection. Patient encouraged to wear the shoes on a more regular basis. Patient was started on vitamin D supplementation in case this is more of a stress reaction. Patient will come back and see me again in 6 weeks.

## 2016-02-14 DIAGNOSIS — Z1212 Encounter for screening for malignant neoplasm of rectum: Secondary | ICD-10-CM | POA: Diagnosis not present

## 2016-03-16 DIAGNOSIS — Z01419 Encounter for gynecological examination (general) (routine) without abnormal findings: Secondary | ICD-10-CM | POA: Diagnosis not present

## 2016-03-16 DIAGNOSIS — H04123 Dry eye syndrome of bilateral lacrimal glands: Secondary | ICD-10-CM | POA: Diagnosis not present

## 2016-03-16 DIAGNOSIS — Z6826 Body mass index (BMI) 26.0-26.9, adult: Secondary | ICD-10-CM | POA: Diagnosis not present

## 2016-03-16 MED FILL — ESTRADIOL 0.05 MG PATCH: 0.05 | 84 days supply | Qty: 24 | Fill #0

## 2016-03-25 ENCOUNTER — Ambulatory Visit: Payer: 59 | Admitting: Family Medicine

## 2016-03-25 MED FILL — RESTASIS 0.05% EYE EMULSION: 0.05 | 30 days supply | Qty: 60 | Fill #1

## 2016-03-25 NOTE — Progress Notes (Signed)
[name] [place], [place] [address] Phone: [phone] Subjective:     CC:  Right foot pain follow up. Low back pain follow-up  [id number]  [name] is a 60 y.o. female coming in with complaint of right foot pain.  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 Patient was given an injection. Patient is now 100% better. Seems to be doing very well.  Patient is having some very mild low back pain. Patient was doing some dead lifts in the gym and started having some tightness of the lower back. Nothing severe. Continues to be active. Turning 60 and is biking 59 miles.  Past Medical History:  Diagnosis Date  . Anemia    on iron  . Cancer ([facility])    skin cancer  . Diverticulosis    CT  . Fibroids 09/07/2013  . PONV (postoperative nausea and vomiting)    non recent   . S/P hysterectomy with oophorectomy 09/07/2013  . Seasonal allergies   . SVD (spontaneous vaginal delivery)    x 1   Past Surgical History:  Procedure Laterality Date  . ABDOMINAL HYSTERECTOMY    . COLONOSCOPY     2 or 3  . DILATION AND CURETTAGE OF UTERUS     x 3  . EUS N/A 11/16/2013   Procedure: UPPER ENDOSCOPIC ULTRASOUND (EUS) LINEAR;  Surgeon: Milus Banister, MD;  Location: [facility];  Service: Endoscopy;  Laterality: N/A;  . EYE SURGERY     lasik bilateral  . LAPAROSCOPY N/A 09/25/2013   Procedure: LAPAROSCOPY OPERATIVE;  Surgeon: Elveria Royals, MD;  Location: [facility];  Service: Gynecology;  Laterality: N/A;  . PANCREATECTOMY N/A 12/07/2013   Procedure: LAPAROSCOPIC DISTAL  PANCREATECTOMY;  Surgeon: Stark Klein, MD;  Location: [facility];  Service: General;  Laterality: N/A;  . ROBOTIC ASSISTED TOTAL HYSTERECTOMY WITH BILATERAL SALPINGO OOPHERECTOMY Bilateral 09/07/2013   Procedure: ROBOTIC ASSISTED TOTAL HYSTERECTOMY WITH BILATERAL SALPINGO OOPHORECTOMY;  Surgeon: Elveria Royals, MD;  Location: [facility];  Service:  Gynecology;  Laterality: Bilateral;  . WISDOM TOOTH EXTRACTION     Social History  Substance Use Topics  . Smoking status: Former Smoker    Packs/day: 1.00    Years: 10.00    Types: Cigarettes    Quit date: 01/02/1987  . Smokeless tobacco: Never Used  . Alcohol use Yes     Comment: socially   No Known Allergies Family History  Problem Relation Age of Onset  . Cancer Mother 46    colon,lung   . COPD Mother      Past medical history, social, surgical and family history all reviewed in electronic medical record.   Review of Systems: No headache, visual changes, nausea, vomiting, diarrhea, constipation, dizziness, abdominal pain, skin rash, fevers, chills, night sweats, weight loss, swollen lymph nodes, body aches, joint swelling, muscle aches, chest pain, shortness of breath, mood changes.   Objective  Blood pressure 122/72, pulse 61, SpO2 97 %.  General: No apparent distress alert and oriented x3 mood and affect normal, dressed appropriately.  HEENT: Pupils equal, extraocular movements intact  Respiratory: Patient's speak in full sentences and does not appear short of breath  Cardiovascular: No lower extremity edema, non tender, no erythema  Skin: Warm dry intact with no signs of infection or rash on extremities or on axial skeleton.  Abdomen: Soft nontender  Neuro: Cranial nerves II through XII are intact,  neurovascularly intact in all extremities with 2+ DTRs and 2+ pulses.  Lymph: No lymphadenopathy of posterior or anterior cervical chain or axillae bilaterally.  Gait normal with good balance and coordination.  MSK:  Non tender with full range of motion and good stability and symmetric strength and tone of shoulders, elbows, wrist, hip,  and ankles bilaterally.  Foot Exam shows patient does have a bunion formation bilaterally greater on the right. Patient does have some mild hammer. Patient does have some mild hallux rigidus. Nontender today.   Back Exam:  Inspection:  Unremarkable  Motion: Flexion 45 deg, Extension 25 deg, Side Bending to 35 deg bilaterally,  Rotation to 35 deg bilaterally mild increasing tightness SLR laying: Negative  XSLR laying: Negative  Palpable tenderness: Nontender on exam today FABER: Mild tightness noted bilaterally Sensory change: Gross sensation intact to all lumbar and sacral dermatomes.  Reflexes: 2+ at both patellar tendons, 2+ at achilles tendons, Babinski's downgoing.  Strength at foot  Plantar-flexion: 5/5 Dorsi-flexion: 5/5 Eversion: 5/5 Inversion: 5/5  Leg strength  Quad: 5/5 Hamstring: 5/5 Hip flexor: 5/5 Hip abductors: 4/5  Gait unremarkable.   Osteopathic findings T3 extended rotated inside that right T6 extended rotated and side bent left L2 flexed rotated and side bent right L5 flexed rotated and side bent left Sacrum left on left    Impression and Recommendations:     This case required medical decision making of moderate complexity.

## 2016-03-26 ENCOUNTER — Encounter: Payer: Self-pay | Admitting: Family Medicine

## 2016-03-26 ENCOUNTER — Ambulatory Visit (INDEPENDENT_AMBULATORY_CARE_PROVIDER_SITE_OTHER): Payer: 59 | Admitting: Family Medicine

## 2016-03-26 VITALS — BP 122/72 | HR 61

## 2016-03-26 DIAGNOSIS — M999 Biomechanical lesion, unspecified: Secondary | ICD-10-CM

## 2016-03-26 DIAGNOSIS — M545 Low back pain, unspecified: Secondary | ICD-10-CM

## 2016-03-26 DIAGNOSIS — M9901 Segmental and somatic dysfunction of cervical region: Secondary | ICD-10-CM | POA: Diagnosis not present

## 2016-03-26 DIAGNOSIS — M7751 Other enthesopathy of right foot: Secondary | ICD-10-CM

## 2016-03-26 DIAGNOSIS — M9903 Segmental and somatic dysfunction of lumbar region: Secondary | ICD-10-CM | POA: Diagnosis not present

## 2016-03-26 DIAGNOSIS — M9902 Segmental and somatic dysfunction of thoracic region: Secondary | ICD-10-CM

## 2016-03-26 NOTE — Patient Instructions (Addendum)
Good to see you  Have a great ride Ice when you need it Tart cherry extract at night Consider milk thistle.  See me again in 6 weeks.

## 2016-03-26 NOTE — Assessment & Plan Note (Signed)
Patient continues to be active. With patient going on a very long by Cecilie Lowers patient will do the home exercises on a more regular basis. We discussed icing regimen, which activities to do in which ones to avoid. Follow-up again in 6 weeks.

## 2016-03-26 NOTE — Assessment & Plan Note (Signed)
Significant improvement after the injection. We'll continue with the icing and home exercises.

## 2016-03-26 NOTE — Assessment & Plan Note (Signed)
Decision today to treat with OMT was based on Physical Exam  After verbal consent patient was treated with FPR, ME, soft tissue techniques in cervical, thoracic, lumbar and sacral areas  Patient tolerated the procedure well with improvement in symptoms  Patient given exercises, stretches and lifestyle modifications  See medications in patient instructions if given  Patient will follow up in 6 weeks

## 2016-03-31 DIAGNOSIS — H04123 Dry eye syndrome of bilateral lacrimal glands: Secondary | ICD-10-CM | POA: Diagnosis not present

## 2016-04-10 MED FILL — MONTELUKAST SOD 10 MG TAB: 10 | 90 days supply | Qty: 90 | Fill #3

## 2016-04-16 DIAGNOSIS — J0100 Acute maxillary sinusitis, unspecified: Secondary | ICD-10-CM | POA: Diagnosis not present

## 2016-04-16 DIAGNOSIS — J302 Other seasonal allergic rhinitis: Secondary | ICD-10-CM | POA: Diagnosis not present

## 2016-04-16 DIAGNOSIS — Z6826 Body mass index (BMI) 26.0-26.9, adult: Secondary | ICD-10-CM | POA: Diagnosis not present

## 2016-04-16 DIAGNOSIS — R05 Cough: Secondary | ICD-10-CM | POA: Diagnosis not present

## 2016-04-16 DIAGNOSIS — R062 Wheezing: Secondary | ICD-10-CM | POA: Diagnosis not present

## 2016-04-16 MED FILL — AZITHROMYCIN 500 MG TABLET: 500 | 3 days supply | Qty: 3 | Fill #0

## 2016-05-06 NOTE — Progress Notes (Signed)
[name] [place],  [address] Phone: [phone] Subjective:     CC:  Right foot pain follow up. Low back pain follow-up  [id number]  [name] is a 60 y.o. female coming in with complaint of right Low back pain  Patient is having some very mild low back pain. Unfortunate noticing increasing tightness. Patient is also having radicular symptoms. Patient is having more abdominal pain and does have a past medical history significant for pancreatic cancer. Patient states that it seems to be radiating to the right hip. Seems to be more constant. Has been working out but feels like she has difficulty with lifting her right leg laterally. Concerned because is affecting some daily activities.  Past Medical History:  Diagnosis Date  . Anemia    on iron  . Cancer ([place])    skin cancer  . Diverticulosis    CT  . Fibroids 09/07/2013  . PONV (postoperative nausea and vomiting)    non recent   . S/P hysterectomy with oophorectomy 09/07/2013  . Seasonal allergies   . SVD (spontaneous vaginal delivery)    x 1   Past Surgical History:  Procedure Laterality Date  . ABDOMINAL HYSTERECTOMY    . COLONOSCOPY     2 or 3  . DILATION AND CURETTAGE OF UTERUS     x 3  . EUS N/A 11/16/2013   Procedure: UPPER ENDOSCOPIC ULTRASOUND (EUS) LINEAR;  Surgeon: Milus Banister, MD;  Location: [facility];  Service: Endoscopy;  Laterality: N/A;  . EYE SURGERY     lasik bilateral  . LAPAROSCOPY N/A 09/25/2013   Procedure: LAPAROSCOPY OPERATIVE;  Surgeon: Elveria Royals, MD;  Location: [facility];  Service: Gynecology;  Laterality: N/A;  . PANCREATECTOMY N/A 12/07/2013   Procedure: LAPAROSCOPIC DISTAL  PANCREATECTOMY;  Surgeon: Stark Klein, MD;  Location: [facility];  Service: General;  Laterality: N/A;  . ROBOTIC ASSISTED TOTAL HYSTERECTOMY WITH BILATERAL SALPINGO OOPHERECTOMY Bilateral 09/07/2013   Procedure: ROBOTIC ASSISTED TOTAL HYSTERECTOMY WITH BILATERAL  SALPINGO OOPHORECTOMY;  Surgeon: Elveria Royals, MD;  Location: [facility];  Service: Gynecology;  Laterality: Bilateral;  . WISDOM TOOTH EXTRACTION     Social History  Substance Use Topics  . Smoking status: Former Smoker    Packs/day: 1.00    Years: 10.00    Types: Cigarettes    Quit date: 01/02/1987  . Smokeless tobacco: Never Used  . Alcohol use Yes     Comment: socially   No Known Allergies Family History  Problem Relation Age of Onset  . Cancer Mother 65    colon,lung   . COPD Mother      Past medical history, social, surgical and family history all reviewed in electronic medical record.   Review of Systems: No headache, visual changes, nausea, vomiting, diarrhea, constipation, dizziness, abdominal pain, skin rash, fevers, chills, night sweats, weight loss, swollen lymph nodes, chest pain, shortness of breath, mood changes.   Objective  Blood pressure 128/78, pulse (!) 55, SpO2 96 %.  General: No apparent distress alert and oriented x3 mood and affect normal, dressed appropriately.  HEENT: Pupils equal, extraocular movements intact  Respiratory: Patient's speak in full sentences and does not appear short of breath  Cardiovascular: No lower extremity edema, non tender, no erythema  Skin: Warm dry intact with no signs of infection or rash on extremities or on axial skeleton.  Abdomen: Soft nontender  Neuro: Cranial nerves II through XII are intact, neurovascularly  intact in all extremities with 2+ DTRs and 2+ pulses.  Lymph: No lymphadenopathy of posterior or anterior cervical chain or axillae bilaterally.  Gait normal with good balance and coordination.  MSK:  Non tender with full range of motion and good stability and symmetric strength and tone of shoulders, elbows, wrist, hip,  and ankles bilaterally.    Back Exam:  Inspection: Unremarkable  Motion: Flexion 35 deg with radicular symptoms going to the lateral hip., Extension 25 deg, Side Bending to 35 deg bilaterally,   Rotation to 35 deg bilaterally mild increasing tightness SLR laying: Question for positive and L4 distribution XSLR laying: Negative  Palpable tenderness: Increased tenderness to palpation in the paraspinal musculature of the lumbar spine on the right side FABER: Increased tenderness on the right sign Sensory change: Gross sensation intact to all lumbar and sacral dermatomes.  Reflexes: 2+ at both patellar tendons, 2+ at achilles tendons, Babinski's downgoing.  Strength at foot  4-5 strength all of the hip flexor compared to the contralateral side and 3 out of 5 strength of hip abductor. This is significant change from previous exam. Neurovascularly intact distally.   Osteopathic findings T3 extended rotated inside that right T6 extended rotated and side bent left L2 flexed rotated and side bent right L5 flexed rotated and side bent left Sacrum left on left    Impression and Recommendations:     This case required medical decision making of moderate complexity.

## 2016-05-06 NOTE — Assessment & Plan Note (Signed)
Decision today to treat with OMT was based on Physical Exam  After verbal consent patient was treated with FPR, ME, soft tissue techniques in cervical, thoracic, lumbar and sacral areas  Patient tolerated the procedure well with improvement in symptoms  Patient given exercises, stretches and lifestyle modifications  See medications in patient instructions if given  Patient will follow up in 6-8 weeks

## 2016-05-06 NOTE — Assessment & Plan Note (Addendum)
Significant worsening symptoms with lumbar radiculopathy. Positive straight leg test. Weakness with 4 out of 5 strength of hip flexion as well as abduction. I'm concerned with patient possibly having the nerve impingement. Past medical history significant for pancreatic cancer feel advanced imaging will be warranted. X-rays and MRI ordered today. Patient is to be leaving town on a medication for 10 days and we'll see if we can get this done beforehand in case there is any worsening symptoms. Started on gabapentin. Has a prescription for prednisone if needed well [place]. Hopefully we will have more information from imaging prior to this.

## 2016-05-07 ENCOUNTER — Encounter: Payer: Self-pay | Admitting: Family Medicine

## 2016-05-07 ENCOUNTER — Ambulatory Visit (INDEPENDENT_AMBULATORY_CARE_PROVIDER_SITE_OTHER)
Admission: RE | Admit: 2016-05-07 | Discharge: 2016-05-07 | Disposition: A | Discharge status: Home / Self Care | Payer: 59 | Source: Ambulatory Visit | Attending: Family Medicine | Admitting: Family Medicine

## 2016-05-07 ENCOUNTER — Ambulatory Visit (INDEPENDENT_AMBULATORY_CARE_PROVIDER_SITE_OTHER): Payer: 59 | Admitting: Family Medicine

## 2016-05-07 VITALS — BP 128/78 | HR 55

## 2016-05-07 DIAGNOSIS — G8929 Other chronic pain: Secondary | ICD-10-CM

## 2016-05-07 DIAGNOSIS — M545 Low back pain, unspecified: Secondary | ICD-10-CM

## 2016-05-07 DIAGNOSIS — M999 Biomechanical lesion, unspecified: Secondary | ICD-10-CM

## 2016-05-07 MED ORDER — GABAPENTIN 100 MG PO CAPS: 200.0000 mg | ORAL_CAPSULE | Freq: Every day | ORAL | 3 refills | Status: DC

## 2016-05-07 MED FILL — GABAPENTIN 100 MG CAPSULE: 100 | 30 days supply | Qty: 60 | Fill #0

## 2016-05-07 NOTE — Patient Instructions (Addendum)
Good to see you  Ice 20 minutes 2 times daily. Usually after activity and before bed. Xray downstairs MRI ordered and they will call you  Start gabapentin 100mg  at night, if no help go up to 200mg  nightly See me again in 3 weeks  Have a great trip!

## 2016-05-15 MED FILL — AZITHROMYCIN 250 MG TABLET: 250 | 5 days supply | Qty: 6 | Fill #0

## 2016-05-15 MED FILL — CIPROFLOXACIN HCL 500 MG TA: 500 | 7 days supply | Qty: 14 | Fill #0

## 2016-05-18 DIAGNOSIS — Z85828 Personal history of other malignant neoplasm of skin: Secondary | ICD-10-CM | POA: Diagnosis not present

## 2016-06-08 ENCOUNTER — Ambulatory Visit
Admission: RE | Admit: 2016-06-08 | Discharge: 2016-06-08 | Disposition: A | Discharge status: Home / Self Care | Payer: 59 | Source: Ambulatory Visit | Attending: Family Medicine | Admitting: Family Medicine

## 2016-06-08 DIAGNOSIS — M5126 Other intervertebral disc displacement, lumbar region: Secondary | ICD-10-CM | POA: Diagnosis not present

## 2016-06-08 DIAGNOSIS — G8929 Other chronic pain: Secondary | ICD-10-CM

## 2016-06-08 DIAGNOSIS — M545 Low back pain, unspecified: Secondary | ICD-10-CM

## 2016-06-09 NOTE — Progress Notes (Signed)
[name] [address], [place] [address] Phone: [phone] Subjective:     CC:  Right foot pain follow up. Low back pain follow-up  [id number]  [name] is a 60 y.o. female coming in with complaint of right Low back pain  Patient is having some very mild low back pain. Patient was found to have more of a radicular symptoms with patient's past medical history significant for pancreatic cancer advance imaging was warranted. MRI of the lumbar spine done 06/08/2016 was independently visualized by me. Patient was found to have moderate to severe degenerative disc disease at multiple levels. Seems to be worse from L3-S1. Mild stenosis at L4-L5 and L3-L4 spinal as well as facet.  Patient was to continue with conservative therapy. Patient states Continues to have pain on. Patient states that it still seems the lower back pain. Mild pain going down the leg but nothing severe. Patient denies any fevers chills or any abnormal weight loss. Continues work out but continues to have pain overall.  Past Medical History:  Diagnosis Date  . Anemia    on iron  . Cancer ([place])    skin cancer  . Diverticulosis    CT  . Fibroids 09/07/2013  . PONV (postoperative nausea and vomiting)    non recent   . S/P hysterectomy with oophorectomy 09/07/2013  . Seasonal allergies   . SVD (spontaneous vaginal delivery)    x 1   Past Surgical History:  Procedure Laterality Date  . ABDOMINAL HYSTERECTOMY    . COLONOSCOPY     2 or 3  . DILATION AND CURETTAGE OF UTERUS     x 3  . EUS N/A 11/16/2013   Procedure: UPPER ENDOSCOPIC ULTRASOUND (EUS) LINEAR;  Surgeon: Milus Banister, MD;  Location: [facility];  Service: Endoscopy;  Laterality: N/A;  . EYE SURGERY     lasik bilateral  . LAPAROSCOPY N/A 09/25/2013   Procedure: LAPAROSCOPY OPERATIVE;  Surgeon: Elveria Royals, MD;  Location: [facility];  Service: Gynecology;  Laterality: N/A;  . PANCREATECTOMY N/A 12/07/2013   Procedure: LAPAROSCOPIC DISTAL  PANCREATECTOMY;  Surgeon: Stark Klein, MD;  Location: [facility];  Service: General;  Laterality: N/A;  . ROBOTIC ASSISTED TOTAL HYSTERECTOMY WITH BILATERAL SALPINGO OOPHERECTOMY Bilateral 09/07/2013   Procedure: ROBOTIC ASSISTED TOTAL HYSTERECTOMY WITH BILATERAL SALPINGO OOPHORECTOMY;  Surgeon: Elveria Royals, MD;  Location: [facility];  Service: Gynecology;  Laterality: Bilateral;  . WISDOM TOOTH EXTRACTION     Social History  Substance Use Topics  . Smoking status: Former Smoker    Packs/day: 1.00    Years: 10.00    Types: Cigarettes    Quit date: 01/02/1987  . Smokeless tobacco: Never Used  . Alcohol use Yes     Comment: socially   No Known Allergies Family History  Problem Relation Age of Onset  . Cancer Mother 88    colon,lung   . COPD Mother      Past medical history, social, surgical and family history all reviewed in electronic medical record.   Review of Systems: No headache, visual changes, nausea, vomiting, diarrhea, constipation, dizziness, abdominal pain, skin rash, fevers, chills, night sweats, weight loss, swollen lymph nodes, chest pain, shortness of breath, mood changes.   Objective  Blood pressure 122/74, pulse (!) 53, height 5\' 5"  (1.651 m), weight 161 lb (73 kg).  Systems examined below as of 06/10/16 General: NAD A&O x3 mood, affect normal  HEENT: Pupils equal, extraocular movements  intact no nystagmus Respiratory: not short of breath at rest or with speaking Cardiovascular: No lower extremity edema, non tender Skin: Warm dry intact with no signs of infection or rash on extremities or on axial skeleton. Abdomen: Soft nontender, no masses Neuro: Cranial nerves  intact, neurovascularly intact in all extremities with 2+ DTRs and 2+ pulses. Lymph: No lymphadenopathy appreciated today  Gait normal with good balance and coordination.  MSK: Non tender with full range of motion and good stability and symmetric strength and tone of shoulders,  elbows, wrist,  knee hips and ankles bilaterally.   Back Exam:  Inspection: Unremarkable  Motion: Flexion 35 deg with radicular symptoms going to the lateral hip still present., Extension 20 deg with worsening pain, Side Bending to 35 deg bilaterally,  Rotation to 35 deg bilaterally mild increasing tightness SLR laying: Positive still on the left sign XSLR laying: Negative  Palpable tenderness: Tenderness in the paraspinal musculature FABER: Increased tenderness on the right sign Sensory change: Gross sensation intact to all lumbar and sacral dermatomes.  Reflexes: 2+ at both patellar tendons, 2+ at achilles tendons, Babinski's downgoing.  Strength at foot  Continue mild weakness of the lower extremity mild improvement.   Osteopathic findings T3 extended rotated inside that right T7 extended rotated and side bent left L2 flexed rotated and side bent right L5 flexed rotated and side bent left Sacrum left on left    Impression and Recommendations:     This case required medical decision making of moderate complexity.

## 2016-06-10 ENCOUNTER — Encounter: Payer: Self-pay | Admitting: Family Medicine

## 2016-06-10 ENCOUNTER — Ambulatory Visit (INDEPENDENT_AMBULATORY_CARE_PROVIDER_SITE_OTHER): Payer: 59 | Admitting: Family Medicine

## 2016-06-10 VITALS — BP 122/74 | HR 53 | Ht 65.0 in | Wt 161.0 lb

## 2016-06-10 DIAGNOSIS — G8929 Other chronic pain: Secondary | ICD-10-CM

## 2016-06-10 DIAGNOSIS — M5416 Radiculopathy, lumbar region: Secondary | ICD-10-CM | POA: Diagnosis not present

## 2016-06-10 DIAGNOSIS — M999 Biomechanical lesion, unspecified: Secondary | ICD-10-CM | POA: Diagnosis not present

## 2016-06-10 DIAGNOSIS — M545 Low back pain, unspecified: Secondary | ICD-10-CM

## 2016-06-10 NOTE — Assessment & Plan Note (Signed)
Decision today to treat with OMT was based on Physical Exam  After verbal consent patient was treated with FPR, ME, soft tissue techniques in cervical, thoracic, lumbar and sacral areas  Patient tolerated the procedure well with improvement in symptoms  Patient given exercises, stretches and lifestyle modifications  See medications in patient instructions if given  Patient will follow up in 6-8 weeks                     Decision today to treat with OMT was based on Physical Exam  After verbal consent patient was treated with FPR, ME, soft tissue techniques in cervical, thoracic, lumbar and sacral areas  Patient tolerated the procedure well with improvement in symptoms  Patient given exercises, stretches and lifestyle modifications  See medications in patient instructions if given  Patient will follow up in 4-6 weeks

## 2016-06-10 NOTE — Assessment & Plan Note (Signed)
Patient continues to have some mild low back pain. Seems to be doing relatively well overall. We discussed with patient about the next treatment options. Patient has elected try an epidural steroid injection. I think that this will be beneficial with patient's finding on the MRI. Patient will come back and see me again 2 weeks after the epidural we'll see how patient response.

## 2016-06-10 NOTE — Patient Instructions (Signed)
Good to see you  We will get epidural and see what happens See me again in 2 weeks after the injection  Happy [place] day!

## 2016-06-12 MED FILL — ESTRADIOL 0.05 MG PATCH: 0.05 | 84 days supply | Qty: 24 | Fill #1

## 2016-06-16 MED FILL — GABAPENTIN 100 MG CAPSULE: 100 | 30 days supply | Qty: 60 | Fill #1

## 2016-07-02 ENCOUNTER — Other Ambulatory Visit: Payer: 59

## 2016-07-07 ENCOUNTER — Ambulatory Visit
Admission: RE | Admit: 2016-07-07 | Discharge: 2016-07-07 | Disposition: A | Discharge status: Home / Self Care | Payer: 59 | Source: Ambulatory Visit | Attending: Family Medicine | Admitting: Family Medicine

## 2016-07-07 DIAGNOSIS — M5416 Radiculopathy, lumbar region: Secondary | ICD-10-CM

## 2016-07-07 DIAGNOSIS — M545 Low back pain: Secondary | ICD-10-CM | POA: Diagnosis not present

## 2016-07-07 MED ORDER — METHYLPREDNISOLONE ACETATE 40 MG/ML INJ SUSP (RADIOLOG
120.0000 mg | Freq: Once | INTRAMUSCULAR | Status: AC
  Administered 2016-07-07: 120 mg via EPIDURAL

## 2016-07-07 MED ORDER — IOPAMIDOL (ISOVUE-M 200) INJECTION 41%
1.0000 mL | Freq: Once | INTRAMUSCULAR | Status: AC
  Administered 2016-07-07: 1 mL via EPIDURAL

## 2016-07-07 NOTE — Discharge Instructions (Signed)

## 2016-07-14 ENCOUNTER — Encounter: Payer: Self-pay | Admitting: Family Medicine

## 2016-07-16 DIAGNOSIS — H04123 Dry eye syndrome of bilateral lacrimal glands: Secondary | ICD-10-CM | POA: Diagnosis not present

## 2016-07-16 DIAGNOSIS — H2513 Age-related nuclear cataract, bilateral: Secondary | ICD-10-CM | POA: Diagnosis not present

## 2016-07-16 MED FILL — RESTASIS 0.05% EYE EMULSION: 0.05 | 30 days supply | Qty: 60 | Fill #0

## 2016-07-17 MED FILL — FLUTICASONE PROP 50 MCG SPR: 50 | 90 days supply | Qty: 48 | Fill #2

## 2016-07-20 MED FILL — MONTELUKAST SOD 10 MG TAB: 10 | 90 days supply | Qty: 90 | Fill #0

## 2016-07-20 MED FILL — GABAPENTIN 100 MG CAPSULE: 100 | 30 days supply | Qty: 60 | Fill #2

## 2016-08-06 DIAGNOSIS — L820 Inflamed seborrheic keratosis: Secondary | ICD-10-CM | POA: Diagnosis not present

## 2016-08-20 MED FILL — GABAPENTIN 100 MG CAPSULE: 100 | 30 days supply | Qty: 60 | Fill #3

## 2016-08-31 ENCOUNTER — Ambulatory Visit: Payer: Self-pay

## 2016-08-31 ENCOUNTER — Encounter: Payer: Self-pay | Admitting: Family Medicine

## 2016-08-31 ENCOUNTER — Ambulatory Visit (INDEPENDENT_AMBULATORY_CARE_PROVIDER_SITE_OTHER): Payer: 59 | Admitting: Family Medicine

## 2016-08-31 VITALS — BP 118/80 | HR 58 | Wt 163.0 lb

## 2016-08-31 DIAGNOSIS — M999 Biomechanical lesion, unspecified: Secondary | ICD-10-CM | POA: Diagnosis not present

## 2016-08-31 DIAGNOSIS — M25512 Pain in left shoulder: Principal | ICD-10-CM

## 2016-08-31 DIAGNOSIS — S134XXA Sprain of ligaments of cervical spine, initial encounter: Secondary | ICD-10-CM | POA: Diagnosis not present

## 2016-08-31 DIAGNOSIS — G8929 Other chronic pain: Secondary | ICD-10-CM

## 2016-08-31 DIAGNOSIS — M25511 Pain in right shoulder: Secondary | ICD-10-CM

## 2016-08-31 DIAGNOSIS — M545 Low back pain, unspecified: Secondary | ICD-10-CM

## 2016-08-31 MED ORDER — KETOROLAC TROMETHAMINE 60 MG/2ML IM SOLN
60.0000 mg | Freq: Once | INTRAMUSCULAR | Status: AC
  Administered 2016-08-31: 60 mg via INTRAMUSCULAR

## 2016-08-31 MED ORDER — DICLOFENAC SODIUM 2 % TD SOLN: TRANSDERMAL | 2 refills | Status: DC

## 2016-08-31 NOTE — Assessment & Plan Note (Signed)
Decision today to treat with OMT was based on Physical Exam  After verbal consent patient was treated with HVLA, ME, FPR techniques in cervical, thoracic, lumbar and sacral areas  Patient tolerated the procedure well with improvement in symptoms  Patient given exercises, stretches and lifestyle modifications  See medications in patient instructions if given  Patient will follow up in 1 weeks 

## 2016-08-31 NOTE — Progress Notes (Signed)
[name] [address], [place] [address] Phone: [phone] Subjective:     CC:  New neck pain bilateral shoulder pain  Low back pain follow-up  [id number]  [name] is a 61 y.o. female coming in with complaint of right Low back pain  Patient is having some very mild low back pain. Patient was found to have more of a radicular symptoms with patient's past medical history significant for pancreatic cancer advance imaging was warranted. MRI of the lumbar spine done 06/08/2016 was independently visualized by me. Patient was found to have moderate to severe degenerative disc disease at multiple levels. Seems to be worse from L3-S1. Mild stenosis at L4-L5 and L3-L4 spinal as well as facet. Patient was given an epidural. Patient states that the epidural was significantly beneficial. Also fell when walking her dogs. Fell forward.  Caught herself.  Increasing pain in neck and shoulders.  No numbness or weakness. Patient states that she just has more of a soreness. Difficult things such as getting dress even this morning. Happen within 24 hours. Patient denies hitting her head or any loss of consciousness. Rates the severity of pain though is 8 out of 10. Was doing significant a better up to this point.  Past Medical History:  Diagnosis Date  . Anemia    on iron  . Cancer ([place])    skin cancer  . Diverticulosis    CT  . Fibroids 09/07/2013  . PONV (postoperative nausea and vomiting)    non recent   . S/P hysterectomy with oophorectomy 09/07/2013  . Seasonal allergies   . SVD (spontaneous vaginal delivery)    x 1   Past Surgical History:  Procedure Laterality Date  . ABDOMINAL HYSTERECTOMY    . COLONOSCOPY     2 or 3  . DILATION AND CURETTAGE OF UTERUS     x 3  . EUS N/A 11/16/2013   Procedure: UPPER ENDOSCOPIC ULTRASOUND (EUS) LINEAR;  Surgeon: Milus Banister, MD;  Location: [facility];  Service: Endoscopy;  Laterality: N/A;  . EYE  SURGERY     lasik bilateral  . LAPAROSCOPY N/A 09/25/2013   Procedure: LAPAROSCOPY OPERATIVE;  Surgeon: Elveria Royals, MD;  Location: [facility];  Service: Gynecology;  Laterality: N/A;  . PANCREATECTOMY N/A 12/07/2013   Procedure: LAPAROSCOPIC DISTAL  PANCREATECTOMY;  Surgeon: Stark Klein, MD;  Location: [facility];  Service: General;  Laterality: N/A;  . ROBOTIC ASSISTED TOTAL HYSTERECTOMY WITH BILATERAL SALPINGO OOPHERECTOMY Bilateral 09/07/2013   Procedure: ROBOTIC ASSISTED TOTAL HYSTERECTOMY WITH BILATERAL SALPINGO OOPHORECTOMY;  Surgeon: Elveria Royals, MD;  Location: [facility];  Service: Gynecology;  Laterality: Bilateral;  . WISDOM TOOTH EXTRACTION     Social History  Substance Use Topics  . Smoking status: Former Smoker    Packs/day: 1.00    Years: 10.00    Types: Cigarettes    Quit date: 01/02/1987  . Smokeless tobacco: Never Used  . Alcohol use Yes     Comment: socially   No Known Allergies Family History  Problem Relation Age of Onset  . Cancer Mother 68    colon,lung   . COPD Mother      Past medical history, social, surgical and family history all reviewed in electronic medical record.   Review of Systems: No headache, visual changes, nausea, vomiting, diarrhea, constipation, dizziness, abdominal pain, skin rash, fevers, chills, night sweats, weight loss, swollen lymph nodes, chest pain, shortness of breath, mood changes.  Objective  Blood pressure 118/80, pulse (!) 58, weight 163 lb (73.9 kg), SpO2 98 %.  Systems examined below as of 08/31/16 General: NAD A&O x3 mood, affect normal  HEENT: Pupils equal, extraocular movements intact no nystagmus Respiratory: not short of breath at rest or with speaking Cardiovascular: No lower extremity edema, non tender Skin: Warm dry intact with no signs of infection or rash on extremities or on axial skeleton. Abdomen: Soft nontender, no masses Neuro: Cranial nerves  intact, neurovascularly intact in all extremities with 2+ DTRs and 2+  pulses. Lymph: No lymphadenopathy appreciated today  Gait normal with good balance and coordination.  MSK: Non tender with full range of motion and good stability and symmetric strength and tone of  elbows, wrist,  knee hips and ankles bilaterally.   Shoulder:bilateral Inspection reveals no abnormalities, atrophy or asymmetry. Palpation is normal with no tenderness over AC joint or bicipital groove. ROM is full in all planes. Soreness noted thorough the motion Rotator cuff strength normal throughout. + impingement Bilaterally.  Speeds and Yergason's tests normal. No labral pathology noted with negative Obrien's, negative clunk and good stability. Normal scapular function observed. No painful arc and no drop arm sign. No apprehension sign    Neck: Inspection unremarkable. No palpable stepoffs. Negative Spurling's maneuver. tightness lacking the last degrees in all planes.  Grip strength and sensation normal in bilateral hands Strength good C4 to T1 distribution No sensory change to C4 to T1 Negative Hoffman sign bilaterally Reflexes normal   Back Exam:  Inspection: Unremarkable  Motion: Flexion 45 deg, Extension 45 deg, Side Bending to 45 deg bilaterally,  Rotation to 45 deg bilaterally  SLR laying: Negative  XSLR laying: Negative  Palpable tenderness: Pain in the thoracal lumbar junction.Marland Kitchen FABER: negative. Sensory change: Gross sensation intact to all lumbar and sacral dermatomes.  Reflexes: 2+ at both patellar tendons, 2+ at achilles tendons, Babinski's downgoing.  Strength at foot  Plantar-flexion: 5/5 Dorsi-flexion: 5/5 Eversion: 5/5 Inversion: 5/5  Leg strength  Quad: 5/5 Hamstring: 5/5 Hip flexor: 5/5 Hip abductors: 5/5  Gait unremarkable. Significant improvement from previous exam.  Osteopathic findings Cervical C2 flexed rotated and side bent right C4 flexed rotated and side bent left C6 flexed rotated and side bent left T3 extended rotated and side bent  right inhaled third rib T9 extended rotated and side bent left L2 flexed rotated and side bent right Sacrum right on right     Impression and Recommendations:     This case required medical decision making of moderate complexity.

## 2016-08-31 NOTE — Assessment & Plan Note (Signed)
Noted. No true radiation of the pain.  No numbness.  No radiaton down the arm.  Likely sprain RTRC toradol today  No imaging with no radicular symptoms.  Duexis given for 3 days RTC in 1 weeks.

## 2016-08-31 NOTE — Assessment & Plan Note (Signed)
Much better after epidural No change in management.

## 2016-08-31 NOTE — Patient Instructions (Signed)
Good to see you  Ice 20 minutes 2 times daily. Usually after activity and before bed. Duexis 3 times a day for 3 days starting tomorrow. Refilled your pennsaid.  See me again on Friday if not better (OK to double book)

## 2016-09-03 MED FILL — ESTRADIOL 0.05 MG PATCH: 0.05 | 84 days supply | Qty: 24 | Fill #2

## 2016-09-04 ENCOUNTER — Encounter: Payer: Self-pay | Admitting: Family Medicine

## 2016-09-04 ENCOUNTER — Ambulatory Visit: Payer: 59 | Admitting: Family Medicine

## 2016-09-15 ENCOUNTER — Encounter: Payer: Self-pay | Admitting: Family Medicine

## 2016-09-15 ENCOUNTER — Other Ambulatory Visit: Payer: Self-pay | Admitting: Family Medicine

## 2016-09-15 MED ORDER — DICLOFENAC SODIUM 2 % TD SOLN: TRANSDERMAL | 2 refills | Status: DC

## 2016-09-17 MED FILL — GABAPENTIN 100 MG CAPSULE: 100 | 90 days supply | Qty: 180 | Fill #0

## 2016-09-25 DIAGNOSIS — L918 Other hypertrophic disorders of the skin: Secondary | ICD-10-CM | POA: Diagnosis not present

## 2016-09-25 DIAGNOSIS — D2222 Melanocytic nevi of left ear and external auricular canal: Secondary | ICD-10-CM | POA: Diagnosis not present

## 2016-09-25 DIAGNOSIS — D225 Melanocytic nevi of trunk: Secondary | ICD-10-CM | POA: Diagnosis not present

## 2016-09-25 DIAGNOSIS — L84 Corns and callosities: Secondary | ICD-10-CM | POA: Diagnosis not present

## 2016-09-25 DIAGNOSIS — Z85828 Personal history of other malignant neoplasm of skin: Secondary | ICD-10-CM | POA: Diagnosis not present

## 2016-09-25 DIAGNOSIS — L821 Other seborrheic keratosis: Secondary | ICD-10-CM | POA: Diagnosis not present

## 2016-09-25 DIAGNOSIS — L814 Other melanin hyperpigmentation: Secondary | ICD-10-CM | POA: Diagnosis not present

## 2016-09-30 ENCOUNTER — Encounter: Payer: Self-pay | Admitting: Family Medicine

## 2016-09-30 MED ORDER — DICLOFENAC SODIUM 2 % TD SOLN: 2.0000 "application " | Freq: Two times a day (BID) | TRANSDERMAL | 3 refills | Status: DC

## 2016-10-05 ENCOUNTER — Other Ambulatory Visit: Payer: Self-pay | Admitting: Internal Medicine

## 2016-10-05 DIAGNOSIS — Z1231 Encounter for screening mammogram for malignant neoplasm of breast: Secondary | ICD-10-CM

## 2016-10-07 ENCOUNTER — Other Ambulatory Visit (HOSPITAL_COMMUNITY): Payer: Self-pay | Admitting: General Surgery

## 2016-10-07 DIAGNOSIS — D490 Neoplasm of unspecified behavior of digestive system: Secondary | ICD-10-CM

## 2016-10-15 ENCOUNTER — Ambulatory Visit: Payer: Self-pay

## 2016-10-15 ENCOUNTER — Encounter: Payer: Self-pay | Admitting: Family Medicine

## 2016-10-15 ENCOUNTER — Ambulatory Visit (INDEPENDENT_AMBULATORY_CARE_PROVIDER_SITE_OTHER)
Admission: RE | Admit: 2016-10-15 | Discharge: 2016-10-15 | Disposition: A | Discharge status: Home / Self Care | Payer: 59 | Source: Ambulatory Visit | Attending: Family Medicine | Admitting: Family Medicine

## 2016-10-15 ENCOUNTER — Ambulatory Visit (INDEPENDENT_AMBULATORY_CARE_PROVIDER_SITE_OTHER): Payer: 59 | Admitting: Family Medicine

## 2016-10-15 VITALS — BP 122/74 | HR 71 | Ht 65.0 in | Wt 162.0 lb

## 2016-10-15 DIAGNOSIS — M19012 Primary osteoarthritis, left shoulder: Secondary | ICD-10-CM | POA: Diagnosis not present

## 2016-10-15 DIAGNOSIS — G8929 Other chronic pain: Secondary | ICD-10-CM | POA: Diagnosis not present

## 2016-10-15 DIAGNOSIS — M999 Biomechanical lesion, unspecified: Secondary | ICD-10-CM

## 2016-10-15 DIAGNOSIS — M545 Low back pain, unspecified: Secondary | ICD-10-CM

## 2016-10-15 DIAGNOSIS — M25519 Pain in unspecified shoulder: Secondary | ICD-10-CM

## 2016-10-15 DIAGNOSIS — M25512 Pain in left shoulder: Secondary | ICD-10-CM | POA: Diagnosis not present

## 2016-10-15 NOTE — Assessment & Plan Note (Signed)
Decision today to treat with OMT was based on Physical Exam  After verbal consent patient was treated with HVLA, ME, FPR techniques in cervical, thoracic, lumbar and sacral areas  Patient tolerated the procedure well with improvement in symptoms  Patient given exercises, stretches and lifestyle modifications  See medications in patient instructions if given  Patient will follow up in 3-4 weeks  

## 2016-10-15 NOTE — Assessment & Plan Note (Signed)
Patient does have severe arthritis of the back. Known to have some spinal stenosis. Responded well to epidural previously. Repeat epidural order today. Did respond somewhat to osteopathic manipulation as well.

## 2016-10-15 NOTE — Progress Notes (Signed)
[name] [address], [place] [address] Phone: [phone] Subjective:     CC:  New neck pain bilateral shoulder pain  Low back pain follow-up  [id number]  [name] is a 61 y.o. female coming in with complaint of right Low back pain  Patient is having some very mild low back pain. Patient was found to have more of a radicular symptoms with patient's past medical history significant for pancreatic cancer advance imaging was warranted. MRI of the lumbar spine done 06/08/2016 was independently visualized by me. Patient was found to have moderate to severe degenerative disc disease at multiple levels. Seems to be worse from L3-S1. Mild stenosis at L4-L5 and L3-L4 spinal as well as facet. Patient was given an epidural. Patient states that the epidural was significantly beneficial. This was in December. Patient states that the pain is starting to come back slowly again. Patient states that it moderate to severe. Patient tries to be active but continues to have some difficulty with the discomfort. Patient states that she feels that she may need another injection. Some mild radicular symptoms aren't occurring.  Patient is having more pain on the left shoulder this time. Seems to wake her up at night. Still able to work out on a regular basis but finding it difficult.   Past Medical History:  Diagnosis Date  . Anemia    on iron  . Cancer ([place])    skin cancer  . Diverticulosis    CT  . Fibroids 09/07/2013  . PONV (postoperative nausea and vomiting)    non recent   . S/P hysterectomy with oophorectomy 09/07/2013  . Seasonal allergies   . SVD (spontaneous vaginal delivery)    x 1   Past Surgical History:  Procedure Laterality Date  . ABDOMINAL HYSTERECTOMY    . COLONOSCOPY     2 or 3  . DILATION AND CURETTAGE OF UTERUS     x 3  . EUS N/A 11/16/2013   Procedure: UPPER ENDOSCOPIC ULTRASOUND (EUS) LINEAR;  Surgeon: Milus Banister, MD;   Location: [facility];  Service: Endoscopy;  Laterality: N/A;  . EYE SURGERY     lasik bilateral  . LAPAROSCOPY N/A 09/25/2013   Procedure: LAPAROSCOPY OPERATIVE;  Surgeon: Elveria Royals, MD;  Location: [facility];  Service: Gynecology;  Laterality: N/A;  . PANCREATECTOMY N/A 12/07/2013   Procedure: LAPAROSCOPIC DISTAL  PANCREATECTOMY;  Surgeon: Stark Klein, MD;  Location: [facility];  Service: General;  Laterality: N/A;  . ROBOTIC ASSISTED TOTAL HYSTERECTOMY WITH BILATERAL SALPINGO OOPHERECTOMY Bilateral 09/07/2013   Procedure: ROBOTIC ASSISTED TOTAL HYSTERECTOMY WITH BILATERAL SALPINGO OOPHORECTOMY;  Surgeon: Elveria Royals, MD;  Location: [facility];  Service: Gynecology;  Laterality: Bilateral;  . WISDOM TOOTH EXTRACTION     Social History  Substance Use Topics  . Smoking status: Former Smoker    Packs/day: 1.00    Years: 10.00    Types: Cigarettes    Quit date: 01/02/1987  . Smokeless tobacco: Never Used  . Alcohol use Yes     Comment: socially   No Known Allergies Family History  Problem Relation Age of Onset  . Cancer Mother 61    colon,lung   . COPD Mother      Past medical history, social, surgical and family history all reviewed in electronic medical record.   Review of Systems: No headache, visual changes, nausea, vomiting, diarrhea, constipation, dizziness, abdominal pain, skin rash, fevers, chills, night sweats, weight loss,  swollen lymph nodes, body aches, joint swelling, , chest pain, shortness of breath, mood changes.  Marland Kitchen Positive muscle aches  Objective  Blood pressure 122/74, pulse 71, height 5\' 5"  (1.651 m), weight 162 lb (73.5 kg), SpO2 96 %.  Systems examined below as of 10/15/16 General: NAD A&O x3 mood, affect normal  HEENT: Pupils equal, extraocular movements intact no nystagmus Respiratory: not short of breath at rest or with speaking Cardiovascular: No lower extremity edema, non tender Skin: Warm dry intact with no signs of infection or rash on extremities or on  axial skeleton. Abdomen: Soft nontender, no masses Neuro: Cranial nerves  intact, neurovascularly intact in all extremities with 2+ DTRs and 2+ pulses. Lymph: No lymphadenopathy appreciated today  Gait normal with good balance and coordination.  MSK: Non tender with full range of motion and good stability and symmetric strength and tone of , elbows, wrist,  knee hips and ankles bilaterally.    Shoulder: left Inspection reveals no abnormalities, atrophy or asymmetry. Palpation is normal with no tenderness over AC joint or bicipital groove. ROM is full in all planes passively. Rotator cuff strength normal throughout. signs of impingement with positive Neer and Hawkin's tests, but negative empty can sign. Speeds and Yergason's tests normal. No labral pathology noted with negative Obrien's, negative clunk and good stability. Positive crossover sign. Normal scapular function observed. No painful arc and no drop arm sign. No apprehension sign    Procedure: Real-time Ultrasound Guided Injection of left coming clavicular arthritis Device: GE Logiq E  Ultrasound guided injection is preferred based studies that show increased duration, increased effect, greater accuracy, decreased procedural pain, increased response rate with ultrasound guided versus blind injection.  Verbal informed consent obtained.  Time-out conducted.  Noted no overlying erythema, induration, or other signs of local infection.  Skin prepped in a sterile fashion.  Local anesthesia: Topical Ethyl chloride.  With sterile technique and under real time ultrasound guidance:  Joint visualized.  The 25-gauge half-inch needle patient was injected with 0.5 mL of 0.5% Marcaine and 0.5 mL of Kenalog 40 M per deciliter. Completed without difficulty  Pain immediately resolved suggesting accurate placement of the medication.  Advised to call if fevers/chills, erythema, induration, drainage, or persistent bleeding.  Images permanently  stored and available for review in the ultrasound unit.  Impression: Technically successful ultrasound guided injection.     Neck: Inspection unremarkable. No palpable stepoffs. Negative Spurling's maneuver. tightness lacking the last 5 degrees in all planes.  Grip strength and sensation normal in bilateral hands Strength good C4 to T1 distribution No sensory change to C4 to T1 Negative Hoffman sign bilaterally Reflexes normal   Back Exam:  Inspection: Unremarkable  Motion: Flexion 45 deg, Extension 25 deg, Side Bending to 45 deg bilaterally,  Rotation to 45 deg bilaterally  SLR laying: Mild positive radiculopathy XSLR laying: Negative  Palpable tenderness: Did have tenderness to palpation mostly in the thoracolumbar juncture but more in the paraspinal musculature of the lumbar spine right greater than left as well. FABER: negative. Sensory change: Gross sensation intact to all lumbar and sacral dermatomes.  Reflexes: 2+ at both patellar tendons, 2+ at achilles tendons, Babinski's downgoing.  Strength at foot  Plantar-flexion: 5/5 Dorsi-flexion: 5/5 Eversion: 5/5 Inversion: 5/5  Leg strength  Quad: 5/5 Hamstring: 5/5 Hip flexor: 5/5 Hip abductors: 5/5  Gait unremarkable. Significant improvement from previous exam.  Osteopathic findings Cervical C2 flexed rotated and side bent right C4 flexed rotated and side bent left T3 extended  rotated and side bent right inhaled third rib T9 extended rotated and side bent left L2 flexed rotated and side bent right      Impression and Recommendations:     This case required medical decision making of moderate complexity.

## 2016-10-15 NOTE — Assessment & Plan Note (Signed)
Patient responded very well to the injection. We discussed icing regimen and home exercises. We discussed objective is doing which ones to avoid. Encourage patient to increase activity slowly. Avoid overhead activities of possible.

## 2016-10-19 ENCOUNTER — Ambulatory Visit (HOSPITAL_COMMUNITY)
Admission: RE | Admit: 2016-10-19 | Discharge: 2016-10-19 | Disposition: A | Discharge status: Home / Self Care | Payer: 59 | Source: Ambulatory Visit | Attending: General Surgery | Admitting: General Surgery

## 2016-10-19 ENCOUNTER — Other Ambulatory Visit (HOSPITAL_COMMUNITY): Payer: Self-pay | Admitting: General Surgery

## 2016-10-19 DIAGNOSIS — D490 Neoplasm of unspecified behavior of digestive system: Secondary | ICD-10-CM | POA: Diagnosis present

## 2016-10-19 DIAGNOSIS — Z9889 Other specified postprocedural states: Secondary | ICD-10-CM | POA: Diagnosis not present

## 2016-10-19 DIAGNOSIS — D136 Benign neoplasm of pancreas: Secondary | ICD-10-CM | POA: Diagnosis not present

## 2016-10-19 MED ORDER — GADOBENATE DIMEGLUMINE 529 MG/ML IV SOLN
15.0000 mL | Freq: Once | INTRAVENOUS | Status: AC | PRN
  Administered 2016-10-19: 15 mL via INTRAVENOUS

## 2016-10-19 NOTE — Progress Notes (Signed)
Please let patient know that the MRI looks good.  No evidence of recurrent disease.

## 2016-10-22 MED FILL — MONTELUKAST SOD 10 MG TAB: 10 | 90 days supply | Qty: 90 | Fill #1

## 2016-11-03 ENCOUNTER — Ambulatory Visit
Admission: RE | Admit: 2016-11-03 | Discharge: 2016-11-03 | Disposition: A | Discharge status: Home / Self Care | Payer: 59 | Source: Ambulatory Visit | Attending: Internal Medicine | Admitting: Internal Medicine

## 2016-11-03 DIAGNOSIS — Z1231 Encounter for screening mammogram for malignant neoplasm of breast: Secondary | ICD-10-CM

## 2016-11-09 ENCOUNTER — Telehealth: Payer: Self-pay

## 2016-11-09 DIAGNOSIS — R1030 Lower abdominal pain, unspecified: Secondary | ICD-10-CM | POA: Diagnosis not present

## 2016-11-09 DIAGNOSIS — D490 Neoplasm of unspecified behavior of digestive system: Secondary | ICD-10-CM | POA: Diagnosis not present

## 2016-11-09 NOTE — Telephone Encounter (Signed)
Pt has been scheduled and put on the wait list.  She is aware

## 2016-11-09 NOTE — Telephone Encounter (Signed)
-----   Message from Milus Banister, MD sent at 11/09/2016 12:53 PM EDT ----- Sounds good.  We'll reach out to her. Thanks  Monesha Monreal, She needs rov/NGI with me next available for abd pains, consider colonoscopy.  Thanks   ----- Message ----- From: Stark Klein, MD Sent: 11/09/2016  12:08 PM To: Milus Banister, MD  This lady is 4 years into a 5 year cycle for repeat colonoscopy, but had polyps on prior colonoscopy with Dr. Collene Mares.    She has had some new constipation and lower abdominal pain.  ? Reasonable to move up colonoscopy to this year?   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  She wants to stay with you.  tx FB

## 2016-11-09 NOTE — Telephone Encounter (Signed)
Left message on machine to call back  

## 2016-11-17 ENCOUNTER — Encounter: Payer: Self-pay | Admitting: Family Medicine

## 2016-11-17 ENCOUNTER — Other Ambulatory Visit: Payer: Self-pay

## 2016-11-17 DIAGNOSIS — M5416 Radiculopathy, lumbar region: Secondary | ICD-10-CM

## 2016-11-17 MED FILL — RESTASIS 0.05% EYE EMULSION: 0.05 | 30 days supply | Qty: 60 | Fill #1

## 2016-11-20 ENCOUNTER — Telehealth: Payer: Self-pay | Admitting: Gastroenterology

## 2016-11-20 ENCOUNTER — Ambulatory Visit (HOSPITAL_COMMUNITY)
Admission: RE | Admit: 2016-11-20 | Discharge: 2016-11-20 | Disposition: A | Discharge status: Home / Self Care | Payer: 59 | Source: Ambulatory Visit | Attending: Gastroenterology | Admitting: Gastroenterology

## 2016-11-20 DIAGNOSIS — R1031 Right lower quadrant pain: Secondary | ICD-10-CM | POA: Diagnosis not present

## 2016-11-20 DIAGNOSIS — R109 Unspecified abdominal pain: Secondary | ICD-10-CM | POA: Diagnosis not present

## 2016-11-20 DIAGNOSIS — K449 Diaphragmatic hernia without obstruction or gangrene: Secondary | ICD-10-CM | POA: Insufficient documentation

## 2016-11-20 DIAGNOSIS — Z90411 Acquired partial absence of pancreas: Secondary | ICD-10-CM | POA: Insufficient documentation

## 2016-11-20 DIAGNOSIS — R1032 Left lower quadrant pain: Secondary | ICD-10-CM

## 2016-11-20 DIAGNOSIS — K5792 Diverticulitis of intestine, part unspecified, without perforation or abscess without bleeding: Secondary | ICD-10-CM | POA: Diagnosis not present

## 2016-11-20 LAB — COMPLETE METABOLIC PANEL WITH GFR
ALT: 10 U/L (ref 6–29)
AST: 13 U/L (ref 10–35)
Albumin: 4.3 g/dL (ref 3.6–5.1)
Alkaline Phosphatase: 42 U/L (ref 33–130)
BUN: 11 mg/dL (ref 7–25)
CO2: 26 mmol/L (ref 20–31)
Calcium: 9.3 mg/dL (ref 8.6–10.4)
Chloride: 101 mmol/L (ref 98–110)
Creat: 0.95 mg/dL (ref 0.50–1.05)
GFR, Est African American: 76 mL/min (ref >=60)
GFR, Est Non African American: 66 mL/min (ref >=60)
Glucose, Bld: 91 mg/dL (ref 65–99)
Potassium: 4.2 mmol/L (ref 3.5–5.3)
Sodium: 136 mmol/L (ref 135–146)
Total Bilirubin: 1 mg/dL (ref 0.2–1.2)
Total Protein: 7 g/dL (ref 6.1–8.1)

## 2016-11-20 LAB — CBC WITH DIFFERENTIAL/PLATELET
Basophils Absolute: 0 cells/uL (ref 0–200)
Basophils Relative: 0 %
Eosinophils Absolute: 116 cells/uL (ref 15–500)
Eosinophils Relative: 1 %
HCT: 39.2 % (ref 35.0–45.0)
Hemoglobin: 12.8 g/dL (ref 11.7–15.5)
Lymphocytes Relative: 17 %
Lymphs Abs: 1972 cells/uL (ref 850–3900)
MCH: 29.7 pg (ref 27.0–33.0)
MCHC: 32.7 g/dL (ref 32.0–36.0)
MCV: 91 fL (ref 80.0–100.0)
MPV: 10.3 fL (ref 7.5–12.5)
Monocytes Absolute: 812 cells/uL (ref 200–950)
Monocytes Relative: 7 %
Neutro Abs: 8700 cells/uL — ABNORMAL HIGH (ref 1500–7800)
Neutrophils Relative %: 75 %
Platelets: 186 10*3/uL (ref 140–400)
RBC: 4.31 MIL/uL (ref 3.80–5.10)
RDW: 13.2 % (ref 11.0–15.0)
WBC: 11.6 10*3/uL — ABNORMAL HIGH (ref 3.8–10.8)

## 2016-11-20 LAB — URINALYSIS, ROUTINE W REFLEX MICROSCOPIC
Bilirubin Urine: NEGATIVE
Glucose, UA: NEGATIVE
Hgb urine dipstick: NEGATIVE
Leukocytes, UA: NEGATIVE
Nitrite: NEGATIVE
Specific Gravity, Urine: 1.021 (ref 1.001–1.035)
pH: 6 (ref 5.0–8.0)

## 2016-11-20 MED ORDER — AMOXICILLIN-POT CLAVULANATE 500-125 MG PO TABS: ORAL_TABLET | ORAL | 0 refills | Status: DC

## 2016-11-20 MED ORDER — IOPAMIDOL (ISOVUE-300) INJECTION 61%
INTRAVENOUS | Status: AC
  Administered 2016-11-20: 30 mL
  Filled 2016-11-20: qty 30

## 2016-11-20 MED ORDER — IOPAMIDOL (ISOVUE-300) INJECTION 61%
100.0000 mL | Freq: Once | INTRAVENOUS | Status: AC | PRN
  Administered 2016-11-20: 100 mL via INTRAVENOUS

## 2016-11-20 MED FILL — AMOX-CLAV 500-125 MG TABLET: 500-125 | 10 days supply | Qty: 20 | Fill #0

## 2016-11-20 NOTE — Telephone Encounter (Signed)
PCP: [facility]VA  HAVING PAIN IN BELLY BUTTON AND LOWER AND ACROSS THE MIDDLE (ACHY). RADIATES TO RECTUM OVER PAST 1-2 DAYS.  HAD A FEVER: 100F BUT NONE TODAY. APPETITE: DOWN. SLEPT WITH HEATING PAD ON HER BELLY LAST NIGHT. PAIN A LITTLE BETTER. HAVING SOME CONSTIPATION. BMs: 2 YESTERDAY(NL).    PT DENIES FEVER, CHILLS, HEMATOCHEZIA, HEMATEMESIS, nausea, vomiting, melena, diarrhea, CHEST PAIN, SHORTNESS OF BREATH, CHANGE IN BOWEL IN HABITS, constipation, HEMATURIA, DYSURIA, problems swallowing, OR heartburn or indigestion.  PT COMING TO [place] FOR BLOOD DRAW/URINE SAMPLE. WILL PICK UP CONTRAST AND PAIN rX IN RADIOLOGY. STAT CT SCAN TODAY.

## 2016-11-20 NOTE — Telephone Encounter (Signed)
Labs marked as stat and faxed to [facility]. Called pt, she has not ate/drank today. Called [employer]. She can go on to [facility] for CT scan. Gave Dr. Oneida Alar pager number and hold pt. Pt is aware that she can go on to [facility]. She is on the way now and will stop at [facility] to do bloodwork first then go for CT.

## 2016-11-20 NOTE — Addendum Note (Signed)
Addended by: Danie Binder on: 11/20/2016 02:31 PM   Modules accepted: Orders

## 2016-11-20 NOTE — Telephone Encounter (Addendum)
CT reviewed with DR. POFF-ACUTE DIVERTICULITIS. PT DOES USE  MORE THAN HER FAIR SHARE OF ALEVE. AUGMENTIN 500 MG BID FOR 10 DAYS. MED SIDE EFFECTS DISCUSSED. NEEDS TRIAGE FOR TCS IN 4-6 WEEKS, Dx: COLON CANCER SCREENING WITH CLEANPIQ (SAMPLE GIVEN). WILL NEED ZOFRAN 4 MG IV IN PREOP.

## 2016-11-20 NOTE — Telephone Encounter (Signed)
PA info for CT abd/pelvis with contrast submitted via [facility] website.

## 2016-11-23 ENCOUNTER — Other Ambulatory Visit: Payer: Self-pay

## 2016-11-23 DIAGNOSIS — Z1211 Encounter for screening for malignant neoplasm of colon: Secondary | ICD-10-CM

## 2016-11-23 NOTE — Telephone Encounter (Signed)
PA info submitted via [facility] website.

## 2016-11-23 NOTE — Telephone Encounter (Signed)
Noted.Will do

## 2016-11-23 NOTE — Telephone Encounter (Signed)
Called pt, TCS with [facility] scheduled for 01/04/17. Pt already has Clenpiq sample. Instructions mailed to her. Orders entered.  Doris, can you please triage pt before TCS.

## 2016-11-23 NOTE — Telephone Encounter (Signed)
[facility] called office and LMOVM. Authorization not required for CT abd/pelvis with contrast.

## 2016-11-24 NOTE — Telephone Encounter (Signed)
[facility] called. No PA needed for TCS. Ref# [id number].

## 2016-11-25 ENCOUNTER — Other Ambulatory Visit: Payer: 59

## 2016-11-26 ENCOUNTER — Ambulatory Visit
Admission: RE | Admit: 2016-11-26 | Discharge: 2016-11-26 | Disposition: A | Discharge status: Home / Self Care | Payer: 59 | Source: Ambulatory Visit | Attending: Family Medicine | Admitting: Family Medicine

## 2016-11-26 DIAGNOSIS — M545 Low back pain: Secondary | ICD-10-CM | POA: Diagnosis not present

## 2016-11-26 DIAGNOSIS — M5416 Radiculopathy, lumbar region: Secondary | ICD-10-CM

## 2016-11-26 MED ORDER — IOPAMIDOL (ISOVUE-M 200) INJECTION 41%
1.0000 mL | Freq: Once | INTRAMUSCULAR | Status: AC
  Administered 2016-11-26: 1 mL via EPIDURAL

## 2016-11-26 MED ORDER — METHYLPREDNISOLONE ACETATE 40 MG/ML INJ SUSP (RADIOLOG
120.0000 mg | Freq: Once | INTRAMUSCULAR | Status: AC
  Administered 2016-11-26: 120 mg via EPIDURAL

## 2016-11-26 NOTE — Discharge Instructions (Signed)

## 2016-12-01 ENCOUNTER — Encounter: Payer: Self-pay | Admitting: Family Medicine

## 2016-12-02 ENCOUNTER — Other Ambulatory Visit: Payer: Self-pay

## 2016-12-02 MED ORDER — GABAPENTIN 100 MG PO CAPS: 200.0000 mg | ORAL_CAPSULE | Freq: Every day | ORAL | 1 refills | Status: DC

## 2016-12-04 MED ORDER — GABAPENTIN 100 MG PO CAPS: 200.0000 mg | ORAL_CAPSULE | Freq: Three times a day (TID) | ORAL | 3 refills | Status: DC

## 2016-12-04 MED FILL — GABAPENTIN 100 MG CAPSULE: 100 | 30 days supply | Qty: 180 | Fill #0

## 2016-12-07 MED FILL — FLUCONAZOLE 150 MG TABLET: 150 | 1 days supply | Qty: 1 | Fill #0

## 2016-12-14 MED FILL — ESTRADIOL 0.05 MG PATCH: 0.05 | 84 days supply | Qty: 24 | Fill #3

## 2016-12-15 ENCOUNTER — Ambulatory Visit: Payer: 59 | Admitting: Family Medicine

## 2016-12-29 ENCOUNTER — Ambulatory Visit: Payer: 59 | Admitting: Gastroenterology

## 2016-12-31 ENCOUNTER — Ambulatory Visit (INDEPENDENT_AMBULATORY_CARE_PROVIDER_SITE_OTHER): Payer: 59 | Admitting: Family Medicine

## 2016-12-31 ENCOUNTER — Encounter: Payer: Self-pay | Admitting: Family Medicine

## 2016-12-31 VITALS — BP 112/76 | HR 67 | Ht 65.0 in

## 2016-12-31 DIAGNOSIS — G8929 Other chronic pain: Secondary | ICD-10-CM | POA: Diagnosis not present

## 2016-12-31 DIAGNOSIS — M545 Low back pain, unspecified: Secondary | ICD-10-CM

## 2016-12-31 DIAGNOSIS — M999 Biomechanical lesion, unspecified: Secondary | ICD-10-CM | POA: Diagnosis not present

## 2016-12-31 NOTE — Progress Notes (Signed)
[name] [address], [place] [address] Phone: [phone] Subjective:     CC:  New neck pain bilateral shoulder pain  Low back pain follow-up  VHQ:[id number]  [name] is a 61 y.o. female coming in with complaint of right Low back pain  Patient is having some very mild low back pain. Patient was found to have more of a radicular symptoms with patient's past medical history significant for pancreatic cancer advance imaging was warranted. MRI of the lumbar spine done 06/08/2016 was independently visualized by me. Patient was found to have moderate to severe degenerative disc disease at multiple levels. Seems to be worse from L3-S1. Mild stenosis at L4-L5 and L3-L4 spinal as well as facet. Patient was given an epidural. Patient states that the epidural was significantly beneficial. This was in December. Patient started having recurrent pain again. Patient was sent for another epidural 11/26/2016. For the last month patient states she is increased gabapentin. Patient states that the epidural did help but still not pain-free. Patient is somewhat frustrated can she is always with pain. Does not want to stop though patient continues to be fairly active.   Patient is having more pain on the left shoulder and patient was found to have more of a acromioclavicular arthritis given an injection last time we saw her 10/18/2016. Patient states Completely resolved at this time.   Past Medical History:  Diagnosis Date  . Anemia    on iron  . Cancer ([place])    skin cancer  . Diverticulosis    CT  . Fibroids 09/07/2013  . PONV (postoperative nausea and vomiting)    non recent   . S/P hysterectomy with oophorectomy 09/07/2013  . Seasonal allergies   . SVD (spontaneous vaginal delivery)    x 1   Past Surgical History:  Procedure Laterality Date  . ABDOMINAL HYSTERECTOMY    . COLONOSCOPY     2 or 3  . DILATION AND CURETTAGE OF UTERUS     x 3  . EUS N/A  11/16/2013   Procedure: UPPER ENDOSCOPIC ULTRASOUND (EUS) LINEAR;  Surgeon: Milus Banister, MD;  Location: [facility];  Service: Endoscopy;  Laterality: N/A;  . EYE SURGERY     lasik bilateral  . LAPAROSCOPY N/A 09/25/2013   Procedure: LAPAROSCOPY OPERATIVE;  Surgeon: Elveria Royals, MD;  Location: [facility];  Service: Gynecology;  Laterality: N/A;  . PANCREATECTOMY N/A 12/07/2013   Procedure: LAPAROSCOPIC DISTAL  PANCREATECTOMY;  Surgeon: Stark Klein, MD;  Location: [facility];  Service: General;  Laterality: N/A;  . ROBOTIC ASSISTED TOTAL HYSTERECTOMY WITH BILATERAL SALPINGO OOPHERECTOMY Bilateral 09/07/2013   Procedure: ROBOTIC ASSISTED TOTAL HYSTERECTOMY WITH BILATERAL SALPINGO OOPHORECTOMY;  Surgeon: Elveria Royals, MD;  Location: [facility];  Service: Gynecology;  Laterality: Bilateral;  . WISDOM TOOTH EXTRACTION     Social History  Substance Use Topics  . Smoking status: Former Smoker    Packs/day: 1.00    Years: 10.00    Types: Cigarettes    Quit date: 01/02/1987  . Smokeless tobacco: Never Used  . Alcohol use Yes     Comment: socially   No Known Allergies Family History  Problem Relation Age of Onset  . Cancer Mother 26       colon,lung   . COPD Mother      Past medical history, social, surgical and family history all reviewed in electronic medical record.   Review of Systems: No headache, visual changes,  nausea, vomiting, diarrhea, constipation, dizziness, abdominal pain, skin rash, fevers, chills, night sweats, weight loss, swollen lymph nodes, body aches, joint swelling,chest pain, shortness of breath, mood changes.  Positive muscle aches  Objective  There were no vitals taken for this visit.  Systems examined below as of 12/31/16 General: NAD A&O x3 mood, affect normal  HEENT: Pupils equal, extraocular movements intact no nystagmus Respiratory: not short of breath at rest or with speaking Cardiovascular: No lower extremity edema, non tender Skin: Warm dry intact with no  signs of infection or rash on extremities or on axial skeleton. Abdomen: Soft Mild tender but patient did have a recent bout of diverticulitis, no masses Neuro: Cranial nerves  intact, neurovascularly intact in all extremities with 2+ DTRs and 2+ pulses. Lymph: No lymphadenopathy appreciated today  Gait normal with good balance and coordination.  MSK: Non tender with full range of motion and good stability and symmetric strength and tone of  elbows, wrist,  knee hips and ankles bilaterally.      Shoulder: bilateral Inspection reveals no abnormalities, atrophy or asymmetry. Palpation is normal with no tenderness over AC joint or bicipital groove. ROM is full in all planes. Rotator cuff strength normal throughout. No signs of impingement with negative Neer and Hawkin's tests, empty can sign. Speeds and Yergason's tests normal. No labral pathology noted with negative Obrien's, negative clunk and good stability. Normal scapular function observed. No painful arc and no drop arm sign. No apprehension sign Contralateral shoulder unremarkable  Neck: Inspection unremarkable. No palpable stepoffs. Negative Spurling's maneuver. Full neck range of motion Grip strength and sensation normal in bilateral hands Strength good C4 to T1 distribution No sensory change to C4 to T1 Negative Hoffman sign bilaterally Reflexes normal   Back Exam:  Inspection: Unremarkable  Motion: Flexion 45 deg, Extension 45 deg, Side Bending to 45 deg bilaterally,  Rotation to 45 deg bilaterally  SLR laying: Negative  XSLR laying: Negative  Palpable tenderness: . FABER: negative. Sensory change: Gross sensation intact to all lumbar and sacral dermatomes.  Reflexes: 2+ at both patellar tendons, 2+ at achilles tendons, Babinski's downgoing.  Strength at foot  Plantar-flexion: 5/5 Dorsi-flexion: 5/5 Eversion: 5/5 Inversion: 5/5  Leg strength  Quad: 5/5 Hamstring: 5/5 Hip flexor: 5/5 Hip abductors: 5/5  Gait  unremarkable.   Osteopathic findings C2 flexed rotated and side bent right C4 flexed rotated and side bent left C7 flexed rotated and side bent left T3 extended rotated and side bent right inhaled third rib T6 extended rotated and side bent left L2 flexed rotated and side bent right Sacrum right on right       Impression and Recommendations:     This case required medical decision making of moderate complexity.

## 2016-12-31 NOTE — Assessment & Plan Note (Signed)
Continues in the chronic low back pain. Not responding as well to the injections. Patient's pain is likely multifactorial and possibly some gastrointestinal. Patient did have a history of pancreatic cancer. We discussed with patient at great length different treatment options including radiofrequency ablation which she declined as well as different medications which she declined. Continue same regimen that we'll see her little more frequently in 3-4 week follow-up.

## 2016-12-31 NOTE — Patient Instructions (Signed)
Good to see you  Ice is your friend See me again in 3 weeks  

## 2016-12-31 NOTE — Assessment & Plan Note (Signed)
Decision today to treat with OMT was based on Physical Exam  After verbal consent patient was treated with HVLA, ME, FPR techniques in cervical, thoracic, lumbar and sacral areas  Patient tolerated the procedure well with improvement in symptoms  Patient given exercises, stretches and lifestyle modifications  See medications in patient instructions if given  Patient will follow up in 3-4 weeks  

## 2017-01-04 ENCOUNTER — Encounter (HOSPITAL_COMMUNITY): Payer: Self-pay | Admitting: *Deleted

## 2017-01-04 ENCOUNTER — Encounter (HOSPITAL_COMMUNITY)
Admission: RE | Disposition: A | Discharge status: Home / Self Care | Payer: Self-pay | Source: Ambulatory Visit | Attending: Gastroenterology

## 2017-01-04 ENCOUNTER — Ambulatory Visit (HOSPITAL_COMMUNITY)
Admission: RE | Admit: 2017-01-04 | Discharge: 2017-01-04 | Disposition: A | Discharge status: Home / Self Care | Payer: 59 | Source: Ambulatory Visit | Attending: Gastroenterology | Admitting: Gastroenterology

## 2017-01-04 DIAGNOSIS — Z800 Family history of malignant neoplasm of digestive organs: Secondary | ICD-10-CM

## 2017-01-04 DIAGNOSIS — Z90710 Acquired absence of both cervix and uterus: Secondary | ICD-10-CM | POA: Diagnosis not present

## 2017-01-04 DIAGNOSIS — Z801 Family history of malignant neoplasm of trachea, bronchus and lung: Secondary | ICD-10-CM | POA: Insufficient documentation

## 2017-01-04 DIAGNOSIS — Z86010 Personal history of colonic polyps: Secondary | ICD-10-CM

## 2017-01-04 DIAGNOSIS — Z85828 Personal history of other malignant neoplasm of skin: Secondary | ICD-10-CM | POA: Insufficient documentation

## 2017-01-04 DIAGNOSIS — K635 Polyp of colon: Secondary | ICD-10-CM

## 2017-01-04 DIAGNOSIS — Z1211 Encounter for screening for malignant neoplasm of colon: Secondary | ICD-10-CM | POA: Insufficient documentation

## 2017-01-04 DIAGNOSIS — K648 Other hemorrhoids: Secondary | ICD-10-CM | POA: Insufficient documentation

## 2017-01-04 DIAGNOSIS — K621 Rectal polyp: Secondary | ICD-10-CM | POA: Diagnosis not present

## 2017-01-04 DIAGNOSIS — Z90410 Acquired total absence of pancreas: Secondary | ICD-10-CM | POA: Insufficient documentation

## 2017-01-04 DIAGNOSIS — Z79899 Other long term (current) drug therapy: Secondary | ICD-10-CM | POA: Diagnosis not present

## 2017-01-04 DIAGNOSIS — Z825 Family history of asthma and other chronic lower respiratory diseases: Secondary | ICD-10-CM | POA: Insufficient documentation

## 2017-01-04 DIAGNOSIS — Z87891 Personal history of nicotine dependence: Secondary | ICD-10-CM | POA: Insufficient documentation

## 2017-01-04 HISTORY — PX: COLONOSCOPY: SHX5424

## 2017-01-04 HISTORY — PX: POLYPECTOMY: SHX5525

## 2017-01-04 SURGERY — COLONOSCOPY
Anesthesia: Moderate Sedation

## 2017-01-04 MED ORDER — ONDANSETRON HCL 4 MG/2ML IJ SOLN
4.0000 mg | Freq: Once | INTRAMUSCULAR | Status: AC
  Administered 2017-01-04: 4 mg via INTRAVENOUS

## 2017-01-04 MED ORDER — ONDANSETRON HCL 4 MG/2ML IJ SOLN
INTRAMUSCULAR | Status: AC
  Filled 2017-01-04: qty 2

## 2017-01-04 MED ORDER — SODIUM CHLORIDE 0.9 % IV SOLN
INTRAVENOUS | Status: DC
  Administered 2017-01-04: 08:00:00 via INTRAVENOUS

## 2017-01-04 MED ORDER — MIDAZOLAM HCL 5 MG/5ML IJ SOLN
INTRAMUSCULAR | Status: AC
  Filled 2017-01-04: qty 10

## 2017-01-04 MED ORDER — MEPERIDINE HCL 100 MG/ML IJ SOLN
INTRAMUSCULAR | Status: DC | PRN
  Administered 2017-01-04 (×2): 50 mg via INTRAVENOUS

## 2017-01-04 MED ORDER — STERILE WATER FOR IRRIGATION IR SOLN
Status: DC | PRN
  Administered 2017-01-04: 2.5 mL

## 2017-01-04 MED ORDER — MIDAZOLAM HCL 5 MG/5ML IJ SOLN
INTRAMUSCULAR | Status: DC | PRN
  Administered 2017-01-04: 1 mg via INTRAVENOUS
  Administered 2017-01-04 (×2): 2 mg via INTRAVENOUS
  Administered 2017-01-04 (×4): 1 mg via INTRAVENOUS

## 2017-01-04 MED ORDER — MEPERIDINE HCL 100 MG/ML IJ SOLN
INTRAMUSCULAR | Status: AC
  Filled 2017-01-04: qty 2

## 2017-01-04 NOTE — Discharge Instructions (Signed)
You have small internal hemorrhoids and diverticulosis IN YOUR LEFT COLON. YOU HAD ONE SMALL POLYP REMOVED FROM YOUR rectum. YOU HAVE A REDUNDANT RECTOSIGMOID COLON. I HAD TO CHANGE TO THE ULTRASLIM SCOPE TO COMPLETE YOUR COLONOSCOPY.     FOLLOW A HIGH FIBER DIET. AVOID ITEMS THAT CAUSE BLOATING. See info below.  YOUR BIOPSY RESULTS WILL BE AVAILABLE IN MY CHART AFTER JUN 7 AND MY OFFICE WILL CONTACT YOU IN 10-14 DAYS WITH YOUR RESULTS.   Next colonoscopy in 5-10 years. CONSIDER NEXT COLONOSCOPY WITH PROPOFOL AND DEFINITELY YOU SHOULD HAVE AN ULTRASLIM SCOPE DUE TO THE RESTRICTED MOBILITY AND REDUNDANT RECTOSIGMOID COLON.  Colonoscopy Care After Read the instructions outlined below and refer to this sheet in the next week. These discharge instructions provide you with general information on caring for yourself after you leave the hospital. While your treatment has been planned according to the most current medical practices available, unavoidable complications occasionally occur. If you have any problems or questions after discharge, call DR. Dnyla Antonetti, [phone].  ACTIVITY  You may resume your regular activity, but move at a slower pace for the next 24 hours.   Take frequent rest periods for the next 24 hours.   Walking will help get rid of the air and reduce the bloated feeling in your belly (abdomen).   No driving for 24 hours (because of the medicine (anesthesia) used during the test).   You may shower.   Do not sign any important legal documents or operate any machinery for 24 hours (because of the anesthesia used during the test).    NUTRITION  Drink plenty of fluids.   You may resume your normal diet as instructed by your doctor.   Begin with a light meal and progress to your normal diet. Heavy or fried foods are harder to digest and may make you feel sick to your stomach (nauseated).   Avoid alcoholic beverages for 24 hours or as instructed.    MEDICATIONS  You may  resume your normal medications.   WHAT YOU CAN EXPECT TODAY  Some feelings of bloating in the abdomen.   Passage of more gas than usual.   Spotting of blood in your stool or on the toilet paper  .  IF YOU HAD POLYPS REMOVED DURING THE COLONOSCOPY:  Eat a soft diet IF YOU HAVE NAUSEA, BLOATING, ABDOMINAL PAIN, OR VOMITING.    FINDING OUT THE RESULTS OF YOUR TEST Not all test results are available during your visit. DR. Oneida Alar WILL CALL YOU WITHIN 7 DAYS OF YOUR PROCEDUE WITH YOUR RESULTS. Do not assume everything is normal if you have not heard from DR. Darshana Curnutt IN ONE WEEK, CALL HER OFFICE AT [phone].  SEEK IMMEDIATE MEDICAL ATTENTION AND CALL THE OFFICE: [phone] IF:  You have more than a spotting of blood in your stool.   Your belly is swollen (abdominal distention).   You are nauseated or vomiting.   You have a temperature over 101F.   You have abdominal pain or discomfort that is severe or gets worse throughout the day.  High-Fiber Diet A high-fiber diet changes your normal diet to include more whole grains, legumes, fruits, and vegetables. Changes in the diet involve replacing refined carbohydrates with unrefined foods. The calorie level of the diet is essentially unchanged. The Dietary Reference Intake (recommended amount) for adult males is 38 grams per day. For adult females, it is 25 grams per day. Pregnant and lactating women should consume 28 grams of fiber per day. Fiber  is the intact part of a plant that is not broken down during digestion. Functional fiber is fiber that has been isolated from the plant to provide a beneficial effect in the body. PURPOSE  Increase stool bulk.   Ease and regulate bowel movements.   Lower cholesterol.   REDUCE RISK OF COLON CANCER  INDICATIONS THAT YOU NEED MORE FIBER  Constipation and hemorrhoids.   Uncomplicated diverticulosis (intestine condition) and irritable bowel syndrome.   Weight management.   As a  protective measure against hardening of the arteries (atherosclerosis), diabetes, and cancer.   GUIDELINES FOR INCREASING FIBER IN THE DIET  Start adding fiber to the diet slowly. A gradual increase of about 5 more grams (2 slices of whole-wheat bread, 2 servings of most fruits or vegetables, or 1 bowl of high-fiber cereal) per day is best. Too rapid an increase in fiber may result in constipation, flatulence, and bloating.   Drink enough water and fluids to keep your urine clear or pale yellow. Water, juice, or caffeine-free drinks are recommended. Not drinking enough fluid may cause constipation.   Eat a variety of high-fiber foods rather than one type of fiber.   Try to increase your intake of fiber through using high-fiber foods rather than fiber pills or supplements that contain small amounts of fiber.   The goal is to change the types of food eaten. Do not supplement your present diet with high-fiber foods, but replace foods in your present diet.   INCLUDE A VARIETY OF FIBER SOURCES  Replace refined and processed grains with whole grains, canned fruits with fresh fruits, and incorporate other fiber sources. White rice, white breads, and most bakery goods contain little or no fiber.   Brown whole-grain rice, buckwheat oats, and many fruits and vegetables are all good sources of fiber. These include: broccoli, Brussels sprouts, cabbage, cauliflower, beets, sweet potatoes, white potatoes (skin on), carrots, tomatoes, eggplant, squash, berries, fresh fruits, and dried fruits.   Cereals appear to be the richest source of fiber. Cereal fiber is found in whole grains and bran. Bran is the fiber-rich outer coat of cereal grain, which is largely removed in refining. In whole-grain cereals, the bran remains. In breakfast cereals, the largest amount of fiber is found in those with "bran" in their names. The fiber content is sometimes indicated on the label.   You may need to include additional  fruits and vegetables each day.   In baking, for 1 cup white flour, you may use the following substitutions:   1 cup whole-wheat flour minus 2 tablespoons.   1/2 cup white flour plus 1/2 cup whole-wheat flour.   Polyps, Colon  A polyp is extra tissue that grows inside your body. Colon polyps grow in the large intestine. The large intestine, also called the colon, is part of your digestive system. It is a long, hollow tube at the end of your digestive tract where your body makes and stores stool. Most polyps are not dangerous. They are benign. This means they are not cancerous. But over time, some types of polyps can turn into cancer. Polyps that are smaller than a pea are usually not harmful. But larger polyps could someday become or may already be cancerous. To be safe, doctors remove all polyps and test them.   WHO GETS POLYPS? Anyone can get polyps, but certain people are more likely than others. You may have a greater chance of getting polyps if:  You are over 50.   You have had  polyps before.   Someone in your family has had polyps.   Someone in your family has had cancer of the large intestine.   Find out if someone in your family has had polyps. You may also be more likely to get polyps if you:   Eat a lot of fatty foods   Smoke   Drink alcohol   Do not exercise  Eat too much   PREVENTION There is not one sure way to prevent polyps. You might be able to lower your risk of getting them if you:  Eat more fruits and vegetables and less fatty food.   Do not smoke.   Avoid alcohol.   Exercise every day.   Lose weight if you are overweight.   Eating more calcium and folate can also lower your risk of getting polyps. Some foods that are rich in calcium are milk, cheese, and broccoli. Some foods that are rich in folate are chickpeas, kidney beans, and spinach.    Diverticulosis Diverticulosis is a common condition that develops when small pouches (diverticula) form in  the wall of the colon. The risk of diverticulosis increases with age. It happens more often in people who eat a low-fiber diet. Most individuals with diverticulosis have no symptoms. Those individuals with symptoms usually experience belly (abdominal) pain, constipation, or loose stools (diarrhea).  HOME CARE INSTRUCTIONS  Increase the amount of fiber in your diet as directed by your caregiver or dietician. This may reduce symptoms of diverticulosis.   Drink at least 6 to 8 glasses of water each day to prevent constipation.   Try not to strain when you have a bowel movement.   Avoiding nuts and seeds to prevent complications is NOT NECESSARY.   FOODS HAVING HIGH FIBER CONTENT INCLUDE:  Fruits. Apple, peach, pear, tangerine, raisins, prunes.   Vegetables. Brussels sprouts, asparagus, broccoli, cabbage, carrot, cauliflower, romaine lettuce, spinach, summer squash, tomato, winter squash, zucchini.   Starchy Vegetables. Baked beans, kidney beans, lima beans, split peas, lentils, potatoes (with skin).   Grains. Whole wheat bread, brown rice, bran flake cereal, plain oatmeal, white rice, shredded wheat, bran muffins.    SEEK IMMEDIATE MEDICAL CARE IF:  You develop increasing pain or severe bloating.   You have an oral temperature above 101F.   You develop vomiting or bowel movements that are bloody or black.   Hemorrhoids Hemorrhoids are dilated (enlarged) veins around the rectum. Sometimes clots will form in the veins. This makes them swollen and painful. These are called thrombosed hemorrhoids. Causes of hemorrhoids include:  Constipation.   Straining to have a bowel movement.   HEAVY LIFTING  HOME CARE INSTRUCTIONS  Eat a well balanced diet and drink 6 to 8 glasses of water every day to avoid constipation. You may also use a bulk laxative.   Avoid straining to have bowel movements.   Keep anal area dry and clean.   Do not use a donut shaped pillow or sit on the toilet  for long periods. This increases blood pooling and pain.   Move your bowels when your body has the urge; this will require less straining and will decrease pain and pressure.

## 2017-01-04 NOTE — H&P (Addendum)
Primary Care Physician:  Prince Solian, MD Primary Gastroenterologist:  Dr. Oneida Alar  Pre-Procedure History & Physical: HPI:  [name] is a 61 y.o. female here for  PERSONAL HISTORY OF POLYPS. MOM HAD COLON CANCER AGE > 60.  Past Medical History:  Diagnosis Date  . Cancer ([place])    skin cancer  . Diverticulosis    CT  . Fibroids 09/07/2013  . PONV (postoperative nausea and vomiting)    non recent   . S/P hysterectomy with oophorectomy 09/07/2013  . Seasonal allergies   . SVD (spontaneous vaginal delivery)    x 1    Past Surgical History:  Procedure Laterality Date  . ABDOMINAL HYSTERECTOMY    . COLONOSCOPY     2 or 3  . DILATION AND CURETTAGE OF UTERUS     x 3  . EUS N/A 11/16/2013   Procedure: UPPER ENDOSCOPIC ULTRASOUND (EUS) LINEAR;  Surgeon: Milus Banister, MD;  [facility];  Service: Endoscopy;  Laterality: N/A;  . EYE SURGERY     lasik bilateral  . LAPAROSCOPY N/A 09/25/2013   Procedure: LAPAROSCOPY OPERATIVE;  Surgeon: Elveria Royals, MD;  Location: [facility];  Service: Gynecology;  Laterality: N/A;  . PANCREATECTOMY N/A 12/07/2013   Procedure: LAPAROSCOPIC DISTAL  PANCREATECTOMY;  Surgeon: Stark Klein, MD;  Location: [facility];  Service: General;  Laterality: N/A;  . ROBOTIC ASSISTED TOTAL HYSTERECTOMY WITH BILATERAL SALPINGO OOPHERECTOMY Bilateral 09/07/2013   Procedure: ROBOTIC ASSISTED TOTAL HYSTERECTOMY WITH BILATERAL SALPINGO OOPHORECTOMY;  Surgeon: Elveria Royals, MD;  Location: [facility];  Service: Gynecology;  Laterality: Bilateral;  . WISDOM TOOTH EXTRACTION      Prior to Admission medications   Medication Sig Start Date End Date Taking? Authorizing Provider  ALPRAZolam [name]) 0.5 MG tablet Take 0.5 mg by mouth daily as needed for anxiety.   Yes [provider]  ascorbic acid (VITAMIN C) 1000 MG tablet Take 1,000 mg by mouth daily.   Yes [provider]  b complex vitamins tablet Take 1 tablet by mouth daily.   Yes [provider]  Calcium Carb-Cholecalciferol (CALCIUM 600+D3) 600-800 MG-UNIT TABS Take 1 tablet by mouth daily.   Yes [provider]  Diclofenac Sodium (PENNSAID) 2 % SOLN Place 2 application onto the skin 2 (two) times daily. Patient taking differently: Place 1 application onto the skin 2 (two) times daily as needed (back pain).  09/30/16  Yes [name], DO  estradiol (VIVELLE-DOT) 0.1 MG/24HR patch Place 1 patch onto the skin 2 (two) times a week. Wednesdays and Saturdays   Yes [provider]  fexofenadine (ALLEGRA) 60 MG tablet Take 60 mg by mouth daily as needed for allergies.   Yes [provider]  fexofenadine-pseudoephedrine (ALLEGRA-D) 60-120 MG per tablet Take 1 tablet by mouth daily.   Yes [provider]  fluticasone (FLONASE) 50 MCG/ACT nasal spray Place 1 spray into both nostrils 2 (two) times daily.    Yes [provider]  gabapentin (NEURONTIN) 100 MG capsule Take 2 capsules (200 mg total) by mouth 3 (three) times daily. Patient taking differently: Take 300 mg by mouth at bedtime.  12/04/16  Yes [name], DO  ibuprofen (ADVIL,MOTRIN) 200 MG tablet Take 400 mg by mouth daily as needed for headache or moderate pain.   Yes [provider]  Lactobacillus (ACIDOPHILUS PO) Take 1 tablet by mouth daily.   Yes [provider]  Misc Natural Products (TURMERIC CURCUMIN) CAPS Take 1 capsule by  mouth daily.   Yes [provider]  montelukast (SINGULAIR) 10 MG tablet Take 10 mg by mouth at bedtime.   Yes [provider]  Multiple Vitamin (MULTIVITAMIN) tablet Take 1 tablet by mouth daily.   Yes [provider]  Omega-3 Fatty Acids (FISH OIL PO) Take 1 capsule by mouth daily.   Yes [provider]  RESTASIS 0.05 % ophthalmic emulsion Place 1 drop into both eyes 2 (two) times daily. 11/17/16  Yes [provider]  Polyethyl Glycol-Propyl Glycol (SYSTANE OP) Apply 1 drop to eye 2  (two) times daily as needed (dry eyes).    [provider]    Allergies as of 11/23/2016  . (No Known Allergies)    Family History  Problem Relation Age of Onset  . Cancer Mother 55       colon,lung   . COPD Mother   . Colon cancer Mother     Social History   Social History  . Marital status: Married    Spouse name: N/A  . Number of children: N/A  . Years of education: N/A   Occupational History  .  [employer]   Social History Main Topics  . Smoking status: Former Smoker    Packs/day: 1.00    Years: 10.00    Types: Cigarettes    Quit date: 01/02/1987  . Smokeless tobacco: Never Used  . Alcohol use Yes     Comment: socially  . Drug use: No  . Sexual activity: Not Currently    Birth control/ protection: Post-menopausal, Surgical   Other Topics Concern  . Not on file   Social History Narrative  . No narrative on file    Review of Systems: See HPI, otherwise negative ROS   Physical Exam: BP (!) 154/69   Pulse (!) 50   Temp 97.7 F (36.5 C) (Oral)   Resp 14   Ht 5\' 6"  (1.676 m)   Wt 152 lb (68.9 kg)   SpO2 99%   BMI 24.53 kg/m  General:   Alert,  pleasant and cooperative in NAD Head:  Normocephalic and atraumatic. Neck:  Supple; Lungs:  Clear throughout to auscultation.    Heart:  Regular rate and rhythm. Abdomen:  Soft, nontender and nondistended. Normal bowel sounds, without guarding, and without rebound.   Neurologic:  Alert and  oriented x4;  grossly normal neurologically.  Impression/Plan:     PERSONAL HISTORY OF POLYPS.  PLAN: 1. TCS TODAY. DISCUSSED PROCEDURE, BENEFITS, & RISKS: < 1% chance of medication reaction, bleeding, perforation, or rupture of spleen/liver.

## 2017-01-04 NOTE — Op Note (Signed)
[facility] Patient Name: [name] Procedure Date: 01/04/2017 8:39 AM MRN: [id number] Date of Birth: Nov 21, 1955 Attending MD: Barney Drain , MD CSN: [id number] Age: 61 Admit Type: Outpatient Procedure:                Colonoscopy WITH COLD FORCEPS POLYPECTOMY Indications:              High risk colon cancer surveillance: Personal                            history of colonic polyps, Family history of colon                            cancer in a first-degree relative Providers:                Barney Drain, MD, Lurline Del, RN, Tammy Vaught, RN Referring MD:             Prince Solian MD Medicines:                Ondansetron 4 mg IV, Meperidine 100 mg IV,                            Midazolam 9 mg IV Complications:            No immediate complications. Estimated Blood Loss:     Estimated blood loss was minimal. Procedure:                Pre-Anesthesia Assessment:                           - Prior to the procedure, a History and Physical                            was performed, and patient medications and                            allergies were reviewed. The patient's tolerance of                            previous anesthesia was also reviewed. The risks                            and benefits of the procedure and the sedation                            options and risks were discussed with the patient.                            All questions were answered, and informed consent                            was obtained. Prior Anticoagulants: The patient has                            taken previous NSAID medication, last dose was 1  day prior to procedure. ASA Grade Assessment: II -                            A patient with mild systemic disease. After                            reviewing the risks and benefits, the patient was                            deemed in satisfactory condition to undergo the                            procedure. After  obtaining informed consent, the                            colonoscope was passed under direct vision.                            Throughout the procedure, the patient's blood                            pressure, pulse, and oxygen saturations were                            monitored continuously. The EC-3890Li ([id number])                            scope was introduced through the anus and advanced                            to the the cecum, identified by appendiceal orifice                            and ileocecal valve. The EC-2990Li ([id number]) scope                            was introduced through the and advanced to the                            Reserve. The colonoscopy was technically                            difficult and complex due to significant looping                            and the patient's agitation. Successful completion                            of the procedure was aided by increasing the dose                            of sedation medication, changing the patient to a  supine position, using manual pressure, withdrawing                            the scope and replacing with the pediatric                            colonoscope, straightening and shortening the scope                            to obtain bowel loop reduction, applying abdominal                            pressure and COLOWRAP. The patient tolerated the                            procedure fairly well. The quality of the bowel                            preparation was excellent. The ileocecal valve,                            appendiceal orifice, and rectum were photographed. Scope In: 9:05:52 AM Scope Out: 9:33:58 AM Scope Withdrawal Time: 0 hours 11 minutes 1 second  Total Procedure Duration: 0 hours 28 minutes 6 seconds  Findings:      The recto-sigmoid colon revealed significantly excessive looping.      A 4 mm polyp was found in the rectum. The polyp was sessile.  The polyp       was removed with a cold biopsy forceps. Resection and retrieval were       complete.      Internal hemorrhoids were found during retroflexion. The hemorrhoids       were small. Impression:               - There was significant looping of the rectosigmoid                            colon.                           - One 4 mm polyp in the rectum, removed with a cold                            biopsy forceps. Resected and retrieved.                           - Internal hemorrhoids. Moderate Sedation:      Moderate (conscious) sedation was administered by the endoscopy nurse       and supervised by the endoscopist. The following parameters were       monitored: oxygen saturation, heart rate, blood pressure, and response       to care. Total physician intraservice time was 46 minutes. Recommendation:           - Await pathology results.                           -  High fiber diet.                           - Continue present medications.                           - Repeat colonoscopy in 5-10 years for surveillance                            WITH MAC AND ULTRASLIM COLONOSCOPE.                           - Patient has a contact number available for                            emergencies. The signs and symptoms of potential                            delayed complications were discussed with the                            patient. Return to normal activities tomorrow.                            Written discharge instructions were provided to the                            patient. Procedure Code(s):        --- Professional ---                           [phone], Colonoscopy, flexible; with biopsy, single                            or multiple                           99152, Moderate sedation services provided by the                            same physician or other qualified health care                            professional performing the diagnostic or                             therapeutic service that the sedation supports,                            requiring the presence of an independent trained                            observer to assist in the monitoring of the                            patient's level of  consciousness and physiological                            status; initial 15 minutes of intraservice time,                            patient age 66 years or older                           [phone], Moderate sedation services; each additional                            15 minutes intraservice time                           99153, Moderate sedation services; each additional                            15 minutes intraservice time Diagnosis Code(s):        --- Professional ---                           Z86.010, Personal history of colonic polyps                           K64.8, Other hemorrhoids                           K62.1, Rectal polyp                           Z80.0, Family history of malignant neoplasm of                            digestive organs CPT copyright 2016 American Medical Association. All rights reserved. The codes documented in this report are preliminary and upon coder review may  be revised to meet current compliance requirements. Barney Drain, MD Barney Drain, MD 01/04/2017 9:42:41 AM This report has been signed electronically. Number of Addenda: 0

## 2017-01-11 ENCOUNTER — Encounter (HOSPITAL_COMMUNITY): Payer: Self-pay | Admitting: Gastroenterology

## 2017-01-11 NOTE — Progress Notes (Signed)
LMOM to call.

## 2017-01-12 NOTE — Progress Notes (Signed)
Left Vm that the results were OK and next colonoscopy is in 5-10 years.

## 2017-01-26 ENCOUNTER — Ambulatory Visit: Payer: 59 | Admitting: Family Medicine

## 2017-01-27 MED FILL — MONTELUKAST SOD 10 MG TAB: 10 | 90 days supply | Qty: 90 | Fill #2

## 2017-02-12 MED FILL — RESTASIS 0.05% EYE EMULSION: 0.05 | 30 days supply | Qty: 60 | Fill #2

## 2017-02-17 MED FILL — GABAPENTIN 100 MG CAP: 100 | 30 days supply | Qty: 180 | Fill #1

## 2017-03-03 DIAGNOSIS — Z0000 Encounter for general adult medical examination without abnormal findings: Secondary | ICD-10-CM | POA: Diagnosis not present

## 2017-03-10 DIAGNOSIS — Z1389 Encounter for screening for other disorder: Secondary | ICD-10-CM | POA: Diagnosis not present

## 2017-03-10 DIAGNOSIS — Z23 Encounter for immunization: Secondary | ICD-10-CM | POA: Diagnosis not present

## 2017-03-10 DIAGNOSIS — G4709 Other insomnia: Secondary | ICD-10-CM | POA: Diagnosis not present

## 2017-03-10 DIAGNOSIS — Z0000 Encounter for general adult medical examination without abnormal findings: Secondary | ICD-10-CM | POA: Diagnosis not present

## 2017-03-10 DIAGNOSIS — M5380 Other specified dorsopathies, site unspecified: Secondary | ICD-10-CM | POA: Diagnosis not present

## 2017-03-10 DIAGNOSIS — E784 Other hyperlipidemia: Secondary | ICD-10-CM | POA: Diagnosis not present

## 2017-03-10 DIAGNOSIS — N393 Stress incontinence (female) (male): Secondary | ICD-10-CM | POA: Diagnosis not present

## 2017-03-10 DIAGNOSIS — J3089 Other allergic rhinitis: Secondary | ICD-10-CM | POA: Diagnosis not present

## 2017-03-10 DIAGNOSIS — D490 Neoplasm of unspecified behavior of digestive system: Secondary | ICD-10-CM | POA: Diagnosis not present

## 2017-03-10 DIAGNOSIS — M1990 Unspecified osteoarthritis, unspecified site: Secondary | ICD-10-CM | POA: Diagnosis not present

## 2017-03-10 DIAGNOSIS — Z800 Family history of malignant neoplasm of digestive organs: Secondary | ICD-10-CM | POA: Diagnosis not present

## 2017-03-15 MED FILL — FLUTICASONE PROP 50 MCG SPR: 50 | 90 days supply | Qty: 48 | Fill #0

## 2017-03-15 MED FILL — ESTRADIOL 0.05 MG PATCH: 0.05 | 84 days supply | Qty: 24 | Fill #4

## 2017-03-18 DIAGNOSIS — J309 Allergic rhinitis, unspecified: Secondary | ICD-10-CM | POA: Diagnosis not present

## 2017-03-18 DIAGNOSIS — Z6826 Body mass index (BMI) 26.0-26.9, adult: Secondary | ICD-10-CM | POA: Diagnosis not present

## 2017-03-18 DIAGNOSIS — J0100 Acute maxillary sinusitis, unspecified: Secondary | ICD-10-CM | POA: Diagnosis not present

## 2017-03-18 DIAGNOSIS — R05 Cough: Secondary | ICD-10-CM | POA: Diagnosis not present

## 2017-03-18 MED FILL — predniSONE 20 MG TABS: 20 | 3 days supply | Qty: 3 | Fill #0

## 2017-03-18 MED FILL — MAGIC MOUTHWASH (6 ING'S): 100000 | 12 days supply | Qty: 480 | Fill #0

## 2017-05-03 MED FILL — MONTELUKAST SOD 10 MG TAB: 10 | 90 days supply | Qty: 90 | Fill #3

## 2017-05-14 DIAGNOSIS — L821 Other seborrheic keratosis: Secondary | ICD-10-CM | POA: Diagnosis not present

## 2017-05-14 DIAGNOSIS — L820 Inflamed seborrheic keratosis: Secondary | ICD-10-CM | POA: Diagnosis not present

## 2017-05-17 DIAGNOSIS — Z6826 Body mass index (BMI) 26.0-26.9, adult: Secondary | ICD-10-CM | POA: Diagnosis not present

## 2017-05-17 DIAGNOSIS — Z01419 Encounter for gynecological examination (general) (routine) without abnormal findings: Secondary | ICD-10-CM | POA: Diagnosis not present

## 2017-06-04 MED FILL — ALPRAZolam 0.5 MG TABS: 0.5 | 23 days supply | Qty: 90 | Fill #0

## 2017-06-28 MED FILL — RESTASIS 0.05% EYE EMULSION: 0.05 | 30 days supply | Qty: 60 | Fill #3

## 2017-06-28 MED FILL — ESTRADIOL 0.05 MG PATCH: 0.05 | 28 days supply | Qty: 8 | Fill #0

## 2017-07-01 DIAGNOSIS — B373 Candidiasis of vulva and vagina: Secondary | ICD-10-CM | POA: Diagnosis not present

## 2017-07-01 MED FILL — FLUCONAZOLE 150 MG TABLET: 150 | 3 days supply | Qty: 3 | Fill #0

## 2017-07-19 DIAGNOSIS — H2513 Age-related nuclear cataract, bilateral: Secondary | ICD-10-CM | POA: Diagnosis not present

## 2017-07-19 DIAGNOSIS — H524 Presbyopia: Secondary | ICD-10-CM | POA: Diagnosis not present

## 2017-07-19 DIAGNOSIS — H04123 Dry eye syndrome of bilateral lacrimal glands: Secondary | ICD-10-CM | POA: Diagnosis not present

## 2017-07-21 MED FILL — SHINGRIX VIAL KIT: 50 | 1 days supply | Qty: 1 | Fill #0

## 2017-08-11 MED FILL — ESTRADIOL 0.05 MG PATCH: 0.05 | 84 days supply | Qty: 24 | Fill #0

## 2017-08-11 MED FILL — FLUTICASONE PROP 50 MCG SPR: 50 | 90 days supply | Qty: 48 | Fill #1

## 2017-09-07 MED FILL — MONTELUKAST SOD 10 MG TAB: 10 | 90 days supply | Qty: 90 | Fill #0

## 2017-09-21 ENCOUNTER — Other Ambulatory Visit: Payer: Self-pay | Admitting: Internal Medicine

## 2017-09-21 DIAGNOSIS — Z139 Encounter for screening, unspecified: Secondary | ICD-10-CM

## 2017-09-22 MED FILL — SHINGRIX VIAL KIT: 50 | 1 days supply | Qty: 1 | Fill #1

## 2017-10-04 MED FILL — OSELTAMIVIR PHOSPHATE 75 MG: 75 | 10 days supply | Qty: 10 | Fill #0

## 2017-10-29 DIAGNOSIS — L814 Other melanin hyperpigmentation: Secondary | ICD-10-CM | POA: Diagnosis not present

## 2017-10-29 DIAGNOSIS — D2262 Melanocytic nevi of left upper limb, including shoulder: Secondary | ICD-10-CM | POA: Diagnosis not present

## 2017-10-29 DIAGNOSIS — L821 Other seborrheic keratosis: Secondary | ICD-10-CM | POA: Diagnosis not present

## 2017-10-29 DIAGNOSIS — D2222 Melanocytic nevi of left ear and external auricular canal: Secondary | ICD-10-CM | POA: Diagnosis not present

## 2017-10-29 DIAGNOSIS — D225 Melanocytic nevi of trunk: Secondary | ICD-10-CM | POA: Diagnosis not present

## 2017-10-29 DIAGNOSIS — L84 Corns and callosities: Secondary | ICD-10-CM | POA: Diagnosis not present

## 2017-10-29 DIAGNOSIS — Z85828 Personal history of other malignant neoplasm of skin: Secondary | ICD-10-CM | POA: Diagnosis not present

## 2017-11-05 ENCOUNTER — Ambulatory Visit
Admission: RE | Admit: 2017-11-05 | Discharge: 2017-11-05 | Disposition: A | Discharge status: Home / Self Care | Payer: 59 | Source: Ambulatory Visit | Attending: Internal Medicine | Admitting: Internal Medicine

## 2017-11-05 DIAGNOSIS — Z1231 Encounter for screening mammogram for malignant neoplasm of breast: Secondary | ICD-10-CM | POA: Diagnosis not present

## 2017-11-05 DIAGNOSIS — Z139 Encounter for screening, unspecified: Secondary | ICD-10-CM

## 2017-11-05 DIAGNOSIS — F4323 Adjustment disorder with mixed anxiety and depressed mood: Secondary | ICD-10-CM | POA: Diagnosis not present

## 2017-11-05 MED FILL — ESTRADIOL 0.05 MG PATCH: 0.05 | 84 days supply | Qty: 24 | Fill #1

## 2017-11-08 ENCOUNTER — Other Ambulatory Visit: Payer: Self-pay | Admitting: Internal Medicine

## 2017-11-08 DIAGNOSIS — R928 Other abnormal and inconclusive findings on diagnostic imaging of breast: Secondary | ICD-10-CM

## 2017-11-08 MED FILL — RESTASIS 0.05% EYE EMULSION: 0.05 | 90 days supply | Qty: 180 | Fill #0

## 2017-11-10 ENCOUNTER — Ambulatory Visit: Admission: RE | Admit: 2017-11-10 | Payer: 59 | Source: Ambulatory Visit

## 2017-11-10 ENCOUNTER — Ambulatory Visit
Admission: RE | Admit: 2017-11-10 | Discharge: 2017-11-10 | Disposition: A | Discharge status: Home / Self Care | Payer: 59 | Source: Ambulatory Visit | Attending: Internal Medicine | Admitting: Internal Medicine

## 2017-11-10 DIAGNOSIS — R928 Other abnormal and inconclusive findings on diagnostic imaging of breast: Secondary | ICD-10-CM

## 2017-11-10 DIAGNOSIS — R922 Inconclusive mammogram: Secondary | ICD-10-CM | POA: Diagnosis not present

## 2017-11-29 DIAGNOSIS — F4323 Adjustment disorder with mixed anxiety and depressed mood: Secondary | ICD-10-CM | POA: Diagnosis not present

## 2017-12-01 ENCOUNTER — Ambulatory Visit: Payer: 59 | Admitting: Family Medicine

## 2017-12-01 ENCOUNTER — Other Ambulatory Visit: Payer: Self-pay

## 2017-12-01 ENCOUNTER — Ambulatory Visit: Payer: Self-pay

## 2017-12-01 ENCOUNTER — Encounter: Payer: Self-pay | Admitting: Family Medicine

## 2017-12-01 VITALS — BP 122/78 | HR 60 | Ht 66.0 in | Wt 146.0 lb

## 2017-12-01 DIAGNOSIS — M25511 Pain in right shoulder: Principal | ICD-10-CM

## 2017-12-01 DIAGNOSIS — G8929 Other chronic pain: Secondary | ICD-10-CM | POA: Diagnosis not present

## 2017-12-01 DIAGNOSIS — S838X1D Sprain of other specified parts of right knee, subsequent encounter: Secondary | ICD-10-CM | POA: Diagnosis not present

## 2017-12-01 DIAGNOSIS — M7550 Bursitis of unspecified shoulder: Secondary | ICD-10-CM | POA: Diagnosis not present

## 2017-12-01 MED ORDER — DICLOFENAC SODIUM 2 % TD SOLN: 2.0000 "application " | Freq: Two times a day (BID) | TRANSDERMAL | 3 refills | Status: DC

## 2017-12-01 MED ORDER — MELOXICAM 15 MG PO TABS: 15.0000 mg | ORAL_TABLET | Freq: Every day | ORAL | 0 refills | Status: DC

## 2017-12-01 MED FILL — MELOXICAM 15 MG TABLET: 15 | 30 days supply | Qty: 30 | Fill #0

## 2017-12-01 NOTE — Progress Notes (Signed)
[name] [place], [place] [address] Phone: [phone] Subjective:     CC: Right hand pain, right shoulder pain  UXN:[id number]  [name] is a 62 y.o. female coming in with complaint of finger swelling for the past 6 months. She is conscious of her sodium intake.   Her right knee is also still bothering her. She has pain with stair climbing and deeper knee bending.   Her shoulder is also bothering her. Right side. She feels that she has bursitis as her pain is on the superior aspect of shoulder. Pain is at rest and with activity.        Past Medical History:  Diagnosis Date  . Cancer ([place])    skin cancer  . Diverticulosis    CT  . Fibroids 09/07/2013  . PONV (postoperative nausea and vomiting)    non recent   . S/P hysterectomy with oophorectomy 09/07/2013  . Seasonal allergies   . SVD (spontaneous vaginal delivery)    x 1   Past Surgical History:  Procedure Laterality Date  . ABDOMINAL HYSTERECTOMY    . COLONOSCOPY     2 or 3  . COLONOSCOPY N/A 01/04/2017   Procedure: COLONOSCOPY;  Surgeon: Danie Binder, MD;  [facility];  Service: Endoscopy;  Laterality: N/A;  10:45am  . DILATION AND CURETTAGE OF UTERUS     x 3  . EUS N/A 11/16/2013   Procedure: UPPER ENDOSCOPIC ULTRASOUND (EUS) LINEAR;  Surgeon: Milus Banister, MD;  [facility];  Service: Endoscopy;  Laterality: N/A;  . EYE SURGERY     lasik bilateral  . LAPAROSCOPY N/A 09/25/2013   Procedure: LAPAROSCOPY OPERATIVE;  Surgeon: Elveria Royals, MD;  Location: [facility];  Service: Gynecology;  Laterality: N/A;  . PANCREATECTOMY N/A 12/07/2013   Procedure: LAPAROSCOPIC DISTAL  PANCREATECTOMY;  Surgeon: Stark Klein, MD;  Location: [facility];  Service: General;  Laterality: N/A;  . POLYPECTOMY  01/04/2017   Procedure: POLYPECTOMY;  Surgeon: Danie Binder, MD;  [facility];  Service: Endoscopy;;  rectal  . ROBOTIC ASSISTED TOTAL HYSTERECTOMY  WITH BILATERAL SALPINGO OOPHERECTOMY Bilateral 09/07/2013   Procedure: ROBOTIC ASSISTED TOTAL HYSTERECTOMY WITH BILATERAL SALPINGO OOPHORECTOMY;  Surgeon: Elveria Royals, MD;  Location: [facility];  Service: Gynecology;  Laterality: Bilateral;  . WISDOM TOOTH EXTRACTION     Social History   Socioeconomic History  . Marital status: Married    Spouse name: Not on file  . Number of children: Not on file  . Years of education: Not on file  . Highest education level: Not on file  Occupational History    Employer: [place]: [employer]  Social Needs  . Financial resource strain: Not on file  . Food insecurity:    Worry: Not on file    Inability: Not on file  . Transportation needs:    Medical: Not on file    Non-medical: Not on file  Tobacco Use  . Smoking status: Former Smoker    Packs/day: 1.00    Years: 10.00    Pack years: 10.00    Types: Cigarettes    Last attempt to quit: 01/02/1987    Years since quitting: 30.9  . Smokeless tobacco: Never Used  Substance and Sexual Activity  . Alcohol use: Yes    Comment: socially  . Drug use: No  . Sexual activity: Not Currently    Birth control/protection: Post-menopausal,  Surgical  Lifestyle  . Physical activity:    Days per week: Not on file    Minutes per session: Not on file  . Stress: Not on file  Relationships  . Social connections:    Talks on phone: Not on file    Gets together: Not on file    Attends religious service: Not on file    Active member of club or organization: Not on file    Attends meetings of clubs or organizations: Not on file    Relationship status: Not on file  Other Topics Concern  . Not on file  Social History Narrative  . Not on file   No Known Allergies Family History  Problem Relation Age of Onset  . Cancer Mother 65       colon,lung   . COPD Mother   . Colon cancer Mother      Past medical history, social, surgical and family history all reviewed in electronic medical  record.  No pertanent information unless stated regarding to the chief complaint.   Review of Systems:Review of systems updated and as accurate as of 12/01/17  No headache, visual changes, nausea, vomiting, diarrhea, constipation, dizziness, abdominal pain, skin rash, fevers, chills, night sweats, weight loss, swollen lymph nodes, body aches, joint swelling, muscle aches, chest pain, shortness of breath, mood changes.  Positive muscle aches  Objective  Blood pressure 122/78, pulse 60, height 5\' 6"  (1.676 m), weight 146 lb (66.2 kg), SpO2 98 %. Systems examined below as of 12/01/17   General: No apparent distress alert and oriented x3 mood and affect normal, dressed appropriately.  HEENT: Pupils equal, extraocular movements intact  Respiratory: Patient's speak in full sentences and does not appear short of breath  Cardiovascular: No lower extremity edema, non tender, no erythema  Skin: Warm dry intact with no signs of infection or rash on extremities or on axial skeleton.  Abdomen: Soft nontender  Neuro: Cranial nerves II through XII are intact, neurovascularly intact in all extremities with 2+ DTRs and 2+ pulses.  Lymph: No lymphadenopathy of posterior or anterior cervical chain or axillae bilaterally.  Gait normal with good balance and coordination.  MSK:  Non tender with full range of motion and good stability and symmetric strength and tone of  elbows, wrist, hip, knee and ankles bilaterally.  Shoulder: Right Inspection reveals no abnormalities, atrophy or asymmetry. Palpation is normal with no tenderness over AC joint or bicipital groove. ROM is full in all planes passively. Rotator cuff strength normal throughout. signs of impingement with positive Neer and Hawkin's tests, but negative empty can sign. Speeds and Yergason's tests normal. No labral pathology noted with negative Obrien's, negative clunk and good stability. Normal scapular function observed. No painful arc and no drop  arm sign. No apprehension sign  MSK US performed of: Right This study was ordered, performed, and interpreted by [name] D.O.  Shoulder:   Supraspinatus:  Appears normal on long and transverse views, Bursal bulge seen with shoulder abduction on impingement view. Infraspinatus:  Appears normal on long and transverse views. Significant increase in Doppler flow Subscapularis:  Appears normal on long and transverse views. Positive bursa Teres Minor:  Appears normal on long and transverse views. AC joint:  Capsule undistended, no geyser sign. Glenohumeral Joint:  Appears normal without effusion. Glenoid Labrum:  Intact without visualized tears. Biceps Tendon:  Appears normal on long and transverse views, no fraying of tendon, tendon located in intertubercular groove, no subluxation with shoulder internal or  external rotation.  Impression: Subacromial bursitis  Procedure: Real-time Ultrasound Guided Injection of right glenohumeral joint Device: GE Logiq E  Ultrasound guided injection is preferred based studies that show increased duration, increased effect, greater accuracy, decreased procedural pain, increased response rate with ultrasound guided versus blind injection.  Verbal informed consent obtained.  Time-out conducted.  Noted no overlying erythema, induration, or other signs of local infection.  Skin prepped in a sterile fashion.  Local anesthesia: Topical Ethyl chloride.  With sterile technique and under real time ultrasound guidance:  Joint visualized.  23g 1  inch needle inserted posterior approach. Pictures taken for needle placement. Patient did have injection of 2 cc of 1% lidocaine, 2 cc of 0.5% Marcaine, and 1.0 cc of Kenalog 40 mg/dL. Completed without difficulty  Pain immediately resolved suggesting accurate placement of the medication.  Advised to call if fevers/chills, erythema, induration, drainage, or persistent bleeding.  Images permanently stored and available for  review in the ultrasound unit.  Impression: Technically successful ultrasound guided injection.    Impression and Recommendations:     This case required medical decision making of moderate complexity.      Note: This dictation was prepared with Dragon dictation along with smaller phrase technology. Any transcriptional errors that result from this process are unintentional.

## 2017-12-01 NOTE — Assessment & Plan Note (Signed)
Stable on exam today.

## 2017-12-01 NOTE — Patient Instructions (Addendum)
Good to see you  [name] is your friend.  injected the shoulder  Meloxicam daily for 10 days then as needed Watch the swelling and see if there is any other triggers.  Tart cherry extract 1200mg  at night See me again in 4 weeks

## 2017-12-01 NOTE — Assessment & Plan Note (Signed)
Injection given, refilled medications, discussed icing regimen and home exercises, discussed which activities to do which wants to avoid.  Topical anti-inflammatories given.  Follow-up again in 4 to 6 weeks

## 2017-12-10 ENCOUNTER — Encounter: Payer: Self-pay | Admitting: Family Medicine

## 2017-12-14 DIAGNOSIS — F4323 Adjustment disorder with mixed anxiety and depressed mood: Secondary | ICD-10-CM | POA: Diagnosis not present

## 2017-12-28 NOTE — Progress Notes (Signed)
[name] [place], [place] [address] Phone: [phone] Subjective:      CC: Neck pain and shoulder pain follow-up  [id number]  [name] is a 62 y.o. female coming in with complaint of  Neck pain.  Was found to have right shoulder bursitis given injection and doing much better.  Taking the meloxicam regularly.  Unfortunately had a fall when she was at the gym.  Fell directly onto her stomach and had significant bruising of her chest wall.  Has been improving slowly.    Past Medical History:  Diagnosis Date  . Cancer ([place])    skin cancer  . Diverticulosis    CT  . Fibroids 09/07/2013  . PONV (postoperative nausea and vomiting)    non recent   . S/P hysterectomy with oophorectomy 09/07/2013  . Seasonal allergies   . SVD (spontaneous vaginal delivery)    x 1   Past Surgical History:  Procedure Laterality Date  . ABDOMINAL HYSTERECTOMY    . COLONOSCOPY     2 or 3  . COLONOSCOPY N/A 01/04/2017   Procedure: COLONOSCOPY;  Surgeon: Danie Binder, MD;  [facility];  Service: Endoscopy;  Laterality: N/A;  10:45am  . DILATION AND CURETTAGE OF UTERUS     x 3  . EUS N/A 11/16/2013   Procedure: UPPER ENDOSCOPIC ULTRASOUND (EUS) LINEAR;  Surgeon: Milus Banister, MD;  [facility];  Service: Endoscopy;  Laterality: N/A;  . EYE SURGERY     lasik bilateral  . LAPAROSCOPY N/A 09/25/2013   Procedure: LAPAROSCOPY OPERATIVE;  Surgeon: Elveria Royals, MD;  Location: [facility];  Service: Gynecology;  Laterality: N/A;  . PANCREATECTOMY N/A 12/07/2013   Procedure: LAPAROSCOPIC DISTAL  PANCREATECTOMY;  Surgeon: Stark Klein, MD;  Location: [facility];  Service: General;  Laterality: N/A;  . POLYPECTOMY  01/04/2017   Procedure: POLYPECTOMY;  Surgeon: Danie Binder, MD;  [facility];  Service: Endoscopy;;  rectal  . ROBOTIC ASSISTED TOTAL HYSTERECTOMY WITH BILATERAL SALPINGO OOPHERECTOMY Bilateral 09/07/2013   Procedure:  ROBOTIC ASSISTED TOTAL HYSTERECTOMY WITH BILATERAL SALPINGO OOPHORECTOMY;  Surgeon: Elveria Royals, MD;  Location: [facility];  Service: Gynecology;  Laterality: Bilateral;  . WISDOM TOOTH EXTRACTION     Social History   Socioeconomic History  . Marital status: Married    Spouse name: Not on file  . Number of children: Not on file  . Years of education: Not on file  . Highest education level: Not on file  Occupational History    Employer: [place]: [employer]  Social Needs  . Financial resource strain: Not on file  . Food insecurity:    Worry: Not on file    Inability: Not on file  . Transportation needs:    Medical: Not on file    Non-medical: Not on file  Tobacco Use  . Smoking status: Former Smoker    Packs/day: 1.00    Years: 10.00    Pack years: 10.00    Types: Cigarettes    Last attempt to quit: 01/02/1987    Years since quitting: 31.0  . Smokeless tobacco: Never Used  Substance and Sexual Activity  . Alcohol use: Yes    Comment: socially  . Drug use: No  . Sexual activity: Not Currently    Birth control/protection: Post-menopausal, Surgical  Lifestyle  . Physical activity:    Days per week: Not on file  Minutes per session: Not on file  . Stress: Not on file  Relationships  . Social connections:    Talks on phone: Not on file    Gets together: Not on file    Attends religious service: Not on file    Active member of club or organization: Not on file    Attends meetings of clubs or organizations: Not on file    Relationship status: Not on file  Other Topics Concern  . Not on file  Social History Narrative  . Not on file   No Known Allergies Family History  Problem Relation Age of Onset  . Cancer Mother 62       colon,lung   . COPD Mother   . Colon cancer Mother      Past medical history, social, surgical and family history all reviewed in electronic medical record.  No pertanent information unless stated regarding to the chief  complaint.   Review of Systems:Review of systems updated and as accurate as of 12/29/17  No headache, visual changes, nausea, vomiting, diarrhea, constipation, dizziness, abdominal pain, skin rash, fevers, chills, night sweats, weight loss, swollen lymph nodes, body aches, joint swelling, , chest pain, shortness of breath, mood changes.  Positive muscle aches  Objective  Blood pressure 130/82, pulse 65, height 5\' 6"  (1.676 m), weight 146 lb (66.2 kg), SpO2 97 %. Systems examined below as of 12/29/17   General: No apparent distress alert and oriented x3 mood and affect normal, dressed appropriately.  HEENT: Pupils equal, extraocular movements intact  Respiratory: Patient's speak in full sentences and does not appear short of breath  Cardiovascular: No lower extremity edema, non tender, no erythema  Skin: Warm dry intact with no signs of infection or rash on extremities or on axial skeleton.  Abdomen: Soft nontender  Neuro: Cranial nerves II through XII are intact, neurovascularly intact in all extremities with 2+ DTRs and 2+ pulses.  Lymph: No lymphadenopathy of posterior or anterior cervical chain or axillae bilaterally.  Gait normal with good balance and coordination.  MSK:  Non tender with full range of motion and good stability and symmetric strength and tone of shoulders, elbows, wrist, hip, knee and ankles bilaterally.  Neck: Inspection loss of lordosis. No palpable stepoffs. Negative Spurling's maneuver. Patient does have some limited range of motion in all planes especially with flexion and extension Grip strength and sensation normal in bilateral hands Strength good C4 to T1 distribution No sensory change to C4 to T1 Negative Hoffman sign bilaterally Reflexes normal Tightness of the trapezius bilaterally  Osteopathic findings  C2 flexed rotated and side bent right C4 flexed rotated and side bent left C7 flexed rotated and side bent left T3 extended rotated and side bent  right inhaled third rib T9 extended rotated and side bent left L3 flexed rotated and side bent right Sacrum right on right     Impression and Recommendations:     This case required medical decision making of moderate complexity.      Note: This dictation was prepared with Dragon dictation along with smaller phrase technology. Any transcriptional errors that result from this process are unintentional.

## 2017-12-29 ENCOUNTER — Encounter: Payer: Self-pay | Admitting: Family Medicine

## 2017-12-29 ENCOUNTER — Other Ambulatory Visit: Payer: Self-pay | Admitting: Family Medicine

## 2017-12-29 ENCOUNTER — Ambulatory Visit: Payer: 59 | Admitting: Family Medicine

## 2017-12-29 ENCOUNTER — Telehealth: Payer: Self-pay | Admitting: Family Medicine

## 2017-12-29 VITALS — BP 130/82 | HR 65 | Ht 66.0 in | Wt 146.0 lb

## 2017-12-29 DIAGNOSIS — M999 Biomechanical lesion, unspecified: Secondary | ICD-10-CM | POA: Diagnosis not present

## 2017-12-29 DIAGNOSIS — S134XXA Sprain of ligaments of cervical spine, initial encounter: Secondary | ICD-10-CM | POA: Diagnosis not present

## 2017-12-29 MED ORDER — TIZANIDINE HCL 4 MG PO TABS: 4.0000 mg | ORAL_TABLET | Freq: Every evening | ORAL | 2 refills | Status: AC

## 2017-12-29 MED FILL — tiZANidine HCL 4 MG TABS: 4 | 30 days supply | Qty: 30 | Fill #0

## 2017-12-29 MED FILL — MELOXICAM 15 MG TABLET: 15 | 90 days supply | Qty: 90 | Fill #0

## 2017-12-29 MED FILL — MONTELUKAST SOD 10 MG TAB: 10 | 90 days supply | Qty: 90 | Fill #1

## 2017-12-29 NOTE — Telephone Encounter (Signed)
Refill done.  
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
## 2017-12-29 NOTE — Patient Instructions (Addendum)
Good to see you  Ice is your friend Zanaflex at night  See me again in 4 weeks just in case

## 2017-12-29 NOTE — Telephone Encounter (Signed)
Copied from [place] [id number]. Topic: Quick Communication - See Telephone Encounter >> Dec 29, 2017 10:47 AM [place] Nail, NT wrote: CRM for notification. See Telephone encounter for: 12/29/17. Crystal with [facility] [employer]ization Department calling and is needing to know if the patient has tried other medications before the [place]? Please advise. CB#: [phone]

## 2017-12-29 NOTE — Assessment & Plan Note (Signed)
Decision today to treat with OMT was based on Physical Exam  After verbal consent patient was treated with HVLA, ME, FPR techniques in cervical, thoracic, lumbar and sacral areas  Patient tolerated the procedure well with improvement in symptoms  Patient given exercises, stretches and lifestyle modifications  See medications in patient instructions if given  Patient will follow up in 4 weeks 

## 2017-12-29 NOTE — Telephone Encounter (Signed)
Discussed with pharmacy

## 2017-12-30 DIAGNOSIS — F4323 Adjustment disorder with mixed anxiety and depressed mood: Secondary | ICD-10-CM | POA: Diagnosis not present

## 2018-01-04 MED FILL — AZITHROMYCIN 250 MG TABLET: 250 | 5 days supply | Qty: 6 | Fill #0

## 2018-01-11 DIAGNOSIS — F4323 Adjustment disorder with mixed anxiety and depressed mood: Secondary | ICD-10-CM | POA: Diagnosis not present

## 2018-01-13 MED FILL — CEFDINIR 300 MG CAPSULE: 300 | 10 days supply | Qty: 20 | Fill #0

## 2018-01-25 NOTE — Progress Notes (Signed)
[name] [place] [place], [place] [address] Phone: [phone] Subjective:      CC: Back and neck pain  [id number]  [name] is a 62 y.o. female coming in with complaint of  Neck and back pain.  Patient has been doing relatively well but is noticing some mild cramping in the lower extremities.  Patient denies any true numbness though.  Continues to try to be active.  Working out neither doing weight lifting with a trainer or yoga most days of the week.      Past Medical History:  Diagnosis Date  . Cancer ([place])    skin cancer  . Diverticulosis    CT  . Fibroids 09/07/2013  . PONV (postoperative nausea and vomiting)    non recent   . S/P hysterectomy with oophorectomy 09/07/2013  . Seasonal allergies   . SVD (spontaneous vaginal delivery)    x 1   Past Surgical History:  Procedure Laterality Date  . ABDOMINAL HYSTERECTOMY    . COLONOSCOPY     2 or 3  . COLONOSCOPY N/A 01/04/2017   Procedure: COLONOSCOPY;  Surgeon: Danie Binder, MD;  [facility];  Service: Endoscopy;  Laterality: N/A;  10:45am  . DILATION AND CURETTAGE OF UTERUS     x 3  . EUS N/A 11/16/2013   Procedure: UPPER ENDOSCOPIC ULTRASOUND (EUS) LINEAR;  Surgeon: Milus Banister, MD;  [facility];  Service: Endoscopy;  Laterality: N/A;  . EYE SURGERY     lasik bilateral  . LAPAROSCOPY N/A 09/25/2013   Procedure: LAPAROSCOPY OPERATIVE;  Surgeon: Elveria Royals, MD;  Location: [facility];  Service: Gynecology;  Laterality: N/A;  . PANCREATECTOMY N/A 12/07/2013   Procedure: LAPAROSCOPIC DISTAL  PANCREATECTOMY;  Surgeon: Stark Klein, MD;  Location: [facility];  Service: General;  Laterality: N/A;  . POLYPECTOMY  01/04/2017   Procedure: POLYPECTOMY;  Surgeon: Danie Binder, MD;  [facility];  Service: Endoscopy;;  rectal  . ROBOTIC ASSISTED TOTAL HYSTERECTOMY WITH BILATERAL SALPINGO OOPHERECTOMY Bilateral 09/07/2013   Procedure: ROBOTIC ASSISTED  TOTAL HYSTERECTOMY WITH BILATERAL SALPINGO OOPHORECTOMY;  Surgeon: Elveria Royals, MD;  Location: [facility];  Service: Gynecology;  Laterality: Bilateral;  . WISDOM TOOTH EXTRACTION     Social History   Socioeconomic History  . Marital status: Married    Spouse name: Not on file  . Number of children: Not on file  . Years of education: Not on file  . Highest education level: Not on file  Occupational History    Employer: [place]: [employer]  Social Needs  . Financial resource strain: Not on file  . Food insecurity:    Worry: Not on file    Inability: Not on file  . Transportation needs:    Medical: Not on file    Non-medical: Not on file  Tobacco Use  . Smoking status: Former Smoker    Packs/day: 1.00    Years: 10.00    Pack years: 10.00    Types: Cigarettes    Last attempt to quit: 01/02/1987    Years since quitting: 31.0  . Smokeless tobacco: Never Used  Substance and Sexual Activity  . Alcohol use: Yes    Comment: socially  . Drug use: No  . Sexual activity: Not Currently    Birth control/protection: Post-menopausal, Surgical  Lifestyle  . Physical activity:    Days per week: Not on file  Minutes per session: Not on file  . Stress: Not on file  Relationships  . Social connections:    Talks on phone: Not on file    Gets together: Not on file    Attends religious service: Not on file    Active member of club or organization: Not on file    Attends meetings of clubs or organizations: Not on file    Relationship status: Not on file  Other Topics Concern  . Not on file  Social History Narrative  . Not on file   No Known Allergies Family History  Problem Relation Age of Onset  . Cancer Mother 64       colon,lung   . COPD Mother   . Colon cancer Mother      Past medical history, social, surgical and family history all reviewed in electronic medical record.  No pertanent information unless stated regarding to the chief complaint.    Review of Systems:Review of systems  No headache, visual changes, nausea, vomiting, diarrhea, constipation, dizziness, abdominal pain, skin rash, fevers, chills, night sweats, weight loss, swollen lymph nodes, body aches, joint swelling, , chest pain, shortness of breath, mood changes.  Mild positive muscle aches and cramping  Objective  Blood pressure 110/70, pulse (!) 55, height 5\' 6"  (1.676 m), weight 145 lb (65.8 kg), SpO2 97 %. Systems examined below as of 01/27/18  General: No apparent distress alert and oriented x3 mood and affect normal, dressed appropriately.  HEENT: Pupils equal, extraocular movements intact  Respiratory: Patient's speak in full sentences and does not appear short of breath  Cardiovascular: No lower extremity edema, non tender, no erythema  Skin: Warm dry intact with no signs of infection or rash on extremities or on axial skeleton.  Abdomen: Soft nontender  Neuro: Cranial nerves II through XII are intact, neurovascularly intact in all extremities with 2+ DTRs and 2+ pulses.  Lymph: No lymphadenopathy of posterior or anterior cervical chain or axillae bilaterally.  Gait normal with good balance and coordination.  MSK:  Non tender with full range of motion and good stability and symmetric strength and tone of shoulders, elbows, wrist, hip, knee and ankles bilaterally.  Neck: Inspection loss of lordosis. No palpable stepoffs. Negative Spurling's maneuver. Full neck range of motion Grip strength and sensation normal in bilateral hands Strength good C4 to T1 distribution No sensory change to C4 to T1 Negative Hoffman sign bilaterally Reflexes normal Mild tightness of the left trapezius  Osteopathic findings C4 flexed rotated and side bent left T4 extended rotated and side bent left inhaled rib T7 extended rotated and side bent left L2 flexed rotated and side bent right Sacrum right on right     Impression and Recommendations:     This case required  medical decision making of moderate complexity.      Note: This dictation was prepared with Dragon dictation along with smaller phrase technology. Any transcriptional errors that result from this process are unintentional.

## 2018-01-26 ENCOUNTER — Other Ambulatory Visit: Payer: 59

## 2018-01-26 ENCOUNTER — Other Ambulatory Visit (INDEPENDENT_AMBULATORY_CARE_PROVIDER_SITE_OTHER): Payer: 59

## 2018-01-26 ENCOUNTER — Encounter: Payer: Self-pay | Admitting: Family Medicine

## 2018-01-26 ENCOUNTER — Ambulatory Visit: Payer: 59 | Admitting: Family Medicine

## 2018-01-26 VITALS — BP 110/70 | HR 55 | Ht 66.0 in | Wt 145.0 lb

## 2018-01-26 DIAGNOSIS — M2550 Pain in unspecified joint: Secondary | ICD-10-CM

## 2018-01-26 DIAGNOSIS — S134XXA Sprain of ligaments of cervical spine, initial encounter: Secondary | ICD-10-CM

## 2018-01-26 DIAGNOSIS — M999 Biomechanical lesion, unspecified: Secondary | ICD-10-CM | POA: Diagnosis not present

## 2018-01-26 LAB — CBC WITH DIFFERENTIAL/PLATELET
Basophils Absolute: 0 10*3/uL (ref 0.0–0.1)
Basophils Relative: 0.7 % (ref 0.0–3.0)
Eosinophils Absolute: 0.2 10*3/uL (ref 0.0–0.7)
Eosinophils Relative: 2.8 % (ref 0.0–5.0)
HCT: 38.4 % (ref 36.0–46.0)
Hemoglobin: 12.8 g/dL (ref 12.0–15.0)
Lymphocytes Relative: 31.8 % (ref 12.0–46.0)
Lymphs Abs: 1.7 10*3/uL (ref 0.7–4.0)
MCHC: 33.4 g/dL (ref 30.0–36.0)
MCV: 90 fl (ref 78.0–100.0)
Monocytes Absolute: 0.4 10*3/uL (ref 0.1–1.0)
Monocytes Relative: 8.2 % (ref 3.0–12.0)
Neutro Abs: 3.1 10*3/uL (ref 1.4–7.7)
Neutrophils Relative %: 56.5 % (ref 43.0–77.0)
Platelets: 197 10*3/uL (ref 150.0–400.0)
RBC: 4.26 Mil/uL (ref 3.87–5.11)
RDW: 13.3 % (ref 11.5–15.5)
WBC: 5.4 10*3/uL (ref 4.0–10.5)

## 2018-01-26 LAB — VITAMIN B12: Vitamin B-12: 523 pg/mL (ref 211–911)

## 2018-01-26 LAB — IBC PANEL
Iron: 95 ug/dL (ref 42–145)
Saturation Ratios: 26.5 % (ref 20.0–50.0)
Transferrin: 256 mg/dL (ref 212.0–360.0)

## 2018-01-26 LAB — VITAMIN D 25 HYDROXY (VIT D DEFICIENCY, FRACTURES): VITD: 38.76 ng/mL (ref 30.00–100.00)

## 2018-01-26 LAB — SEDIMENTATION RATE: Sed Rate: 8 mm/hr (ref 0–30)

## 2018-01-26 NOTE — Patient Instructions (Signed)
Good to see you  Get labs today  Prilosec daily for 2 weeks.  Iron 65mg  with 500mg  of vitamin C daily  Keep up everything else.  See me again in 4-6 weeks
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
## 2018-01-27 NOTE — Assessment & Plan Note (Signed)
Decision today to treat with OMT was based on Physical Exam  After verbal consent patient was treated with HVLA, ME, FPR techniques in cervical, thoracic, lumbar and sacral areas  Patient tolerated the procedure well with improvement in symptoms  Patient given exercises, stretches and lifestyle modifications  See medications in patient instructions if given  Patient will follow up in 4-8 weeks 

## 2018-02-09 DIAGNOSIS — F4323 Adjustment disorder with mixed anxiety and depressed mood: Secondary | ICD-10-CM | POA: Diagnosis not present

## 2018-02-11 MED FILL — ESTRADIOL 0.05 MG PATCH: 0.05 | 84 days supply | Qty: 24 | Fill #2

## 2018-02-11 MED FILL — FLUTICASONE PROP 50 MCG SPR: 50 | 90 days supply | Qty: 48 | Fill #2

## 2018-03-10 DIAGNOSIS — F4323 Adjustment disorder with mixed anxiety and depressed mood: Secondary | ICD-10-CM | POA: Diagnosis not present

## 2018-03-14 ENCOUNTER — Ambulatory Visit: Payer: 59 | Admitting: Family Medicine

## 2018-03-14 ENCOUNTER — Ambulatory Visit: Payer: Self-pay

## 2018-03-14 ENCOUNTER — Encounter: Payer: Self-pay | Admitting: Family Medicine

## 2018-03-14 VITALS — BP 120/84 | HR 58 | Ht 66.0 in | Wt 148.0 lb

## 2018-03-14 DIAGNOSIS — M545 Low back pain, unspecified: Secondary | ICD-10-CM

## 2018-03-14 DIAGNOSIS — M25561 Pain in right knee: Secondary | ICD-10-CM

## 2018-03-14 DIAGNOSIS — S838X1A Sprain of other specified parts of right knee, initial encounter: Secondary | ICD-10-CM | POA: Diagnosis not present

## 2018-03-14 DIAGNOSIS — M999 Biomechanical lesion, unspecified: Secondary | ICD-10-CM | POA: Insufficient documentation

## 2018-03-14 DIAGNOSIS — G8929 Other chronic pain: Secondary | ICD-10-CM

## 2018-03-14 NOTE — Patient Instructions (Signed)
Good to see you  [name] is your friend.  Stay active.  Exercises 3 times a week.  No exercises for legs unless feet are planted.  Meloxicam daily for now See me again in 4 weeks

## 2018-03-14 NOTE — Assessment & Plan Note (Signed)
Stable overall.  Responds well to manipulation.  Discussed posture and ergonomics and core strengthening.  Follow-up again in 4 to 8 weeks

## 2018-03-14 NOTE — Assessment & Plan Note (Signed)
Degenerative lateral meniscal tear.  Discussed icing regimen and home exercise.  We will try the conservative therapy first and worsening symptoms consider injection

## 2018-03-14 NOTE — Assessment & Plan Note (Signed)
Decision today to treat with OMT was based on Physical Exam  After verbal consent patient was treated with HVLA, ME, FPR techniques in cervical, thoracic, rib lumbar and sacral areas  Patient tolerated the procedure well with improvement in symptoms  Patient given exercises, stretches and lifestyle modifications  See medications in patient instructions if given  Patient will follow up in 4-6 weeks 

## 2018-03-14 NOTE — Progress Notes (Signed)
[name] [place], [place] [address] Phone: [phone] Subjective:     CC: Knee pain, back pain and neck pain follow-up  [id number]  [name] is a 62 y.o. female coming in with complaint of knee pain over the patellar tendon, right side. Pain has been occurring for 3 weeks. Tender to touch the patellar tendon. Hurts when she puts her weight on the knee. Does not hurt with walking or cycling.  Patient's insulin sometimes can be very tender to palpation.  Rates the severity of pain is 6 out of 10.       Past Medical History:  Diagnosis Date  . Cancer ([place])    skin cancer  . Diverticulosis    CT  . Fibroids 09/07/2013  . PONV (postoperative nausea and vomiting)    non recent   . S/P hysterectomy with oophorectomy 09/07/2013  . Seasonal allergies   . SVD (spontaneous vaginal delivery)    x 1   Past Surgical History:  Procedure Laterality Date  . ABDOMINAL HYSTERECTOMY    . COLONOSCOPY     2 or 3  . COLONOSCOPY N/A 01/04/2017   Procedure: COLONOSCOPY;  Surgeon: Danie Binder, MD;  [facility];  Service: Endoscopy;  Laterality: N/A;  10:45am  . DILATION AND CURETTAGE OF UTERUS     x 3  . EUS N/A 11/16/2013   Procedure: UPPER ENDOSCOPIC ULTRASOUND (EUS) LINEAR;  Surgeon: Milus Banister, MD;  [facility];  Service: Endoscopy;  Laterality: N/A;  . EYE SURGERY     lasik bilateral  . LAPAROSCOPY N/A 09/25/2013   Procedure: LAPAROSCOPY OPERATIVE;  Surgeon: Elveria Royals, MD;  Location: [facility];  Service: Gynecology;  Laterality: N/A;  . PANCREATECTOMY N/A 12/07/2013   Procedure: LAPAROSCOPIC DISTAL  PANCREATECTOMY;  Surgeon: Stark Klein, MD;  Location: [facility];  Service: General;  Laterality: N/A;  . POLYPECTOMY  01/04/2017   Procedure: POLYPECTOMY;  Surgeon: Danie Binder, MD;  [facility];  Service: Endoscopy;;  rectal  . ROBOTIC ASSISTED TOTAL HYSTERECTOMY WITH BILATERAL SALPINGO OOPHERECTOMY  Bilateral 09/07/2013   Procedure: ROBOTIC ASSISTED TOTAL HYSTERECTOMY WITH BILATERAL SALPINGO OOPHORECTOMY;  Surgeon: Elveria Royals, MD;  Location: [facility];  Service: Gynecology;  Laterality: Bilateral;  . WISDOM TOOTH EXTRACTION     Social History   Socioeconomic History  . Marital status: Married    Spouse name: Not on file  . Number of children: Not on file  . Years of education: Not on file  . Highest education level: Not on file  Occupational History    Employer: [place]: [employer]  Social Needs  . Financial resource strain: Not on file  . Food insecurity:    Worry: Not on file    Inability: Not on file  . Transportation needs:    Medical: Not on file    Non-medical: Not on file  Tobacco Use  . Smoking status: Former Smoker    Packs/[name]: 1.00    Years: 10.00    Pack years: 10.00    Types: Cigarettes    Last attempt to quit: 01/02/1987    Years since quitting: 31.2  . Smokeless tobacco: Never Used  Substance and Sexual Activity  . Alcohol use: Yes    Comment: socially  . Drug use: No  . Sexual activity: Not Currently    Birth control/protection: Post-menopausal, Surgical  Lifestyle  . Physical activity:  Days per week: Not on file    Minutes per session: Not on file  . Stress: Not on file  Relationships  . Social connections:    Talks on phone: Not on file    Gets together: Not on file    Attends religious service: Not on file    Active member of club or organization: Not on file    Attends meetings of clubs or organizations: Not on file    Relationship status: Not on file  Other Topics Concern  . Not on file  Social History Narrative  . Not on file   No Known Allergies Family History  Problem Relation Age of Onset  . Cancer Mother 47       colon,lung   . COPD Mother   . Colon cancer Mother      Past medical history, social, surgical and family history all reviewed in electronic medical record.  No pertanent information unless  stated regarding to the chief complaint.   Review of Systems:Review of systems updated and as accurate as of 03/14/18  No headache, visual changes, nausea, vomiting, diarrhea, constipation, dizziness, abdominal pain, skin rash, fevers, chills, night sweats, weight loss, swollen lymph nodes, body aches, joint swelling, chest pain, shortness of breath, mood changes.  Positive muscle aches  Objective  Blood pressure 120/84, pulse (!) 58, height 5\' 6"  (1.676 m), weight 148 lb (67.1 kg), SpO2 97 %. Systems examined below as of 03/14/18   General: No apparent distress alert and oriented x3 mood and affect normal, dressed appropriately.  HEENT: Pupils equal, extraocular movements intact  Respiratory: Patient's speak in full sentences and does not appear short of breath  Cardiovascular: No lower extremity edema, non tender, no erythema  Skin: Warm dry intact with no signs of infection or rash on extremities or on axial skeleton.  Abdomen: Soft nontender  Neuro: Cranial nerves II through XII are intact, neurovascularly intact in all extremities with 2+ DTRs and 2+ pulses.  Lymph: No lymphadenopathy of posterior or anterior cervical chain or axillae bilaterally.  Gait normal with good balance and coordination.  MSK:  Non tender with full range of motion and good stability and symmetric strength and tone of shoulders, elbows, wrist, hip, and ankles bilaterally.  Knee: Right Normal to inspection with no erythema or effusion or obvious bony abnormalities. Palpation normal with no warmth, joint line tenderness, patellar tenderness, or condyle tenderness. ROM full in flexion and extension and lower leg rotation. Ligaments with solid consistent endpoints including ACL, PCL, LCL, MCL. Positive Mcmurray's, Apley's, and Thessalonian tests. Non painful patellar compression. Patellar glide without crepitus. Patellar and quadriceps tendons unremarkable. Hamstring and quadriceps strength is  normal. Contralateral knee unremarkable  Back exam shows some loss of lordosis.  Some tightness of the trapezius bilaterally as well.  Patient does have a negative straight leg test.  Mild positive Corky Sox test on the right side.  5 out of 5 strength in lower extremities and deep tendon reflexes intact  ] MSK US performed of: Right knee This study was ordered, performed, and interpreted by [name] D.O.  Knee: All structures visualized. Anterior lateral meniscus does have some abnormal blood flow in the area.  No significant displacement.  Questionable degenerative tear noted. Patellar Tendon unremarkable on long and transverse views without effusion. No abnormality of prepatellar bursa. LCL and MCL unremarkable on long and transverse views. No abnormality of origin of medial or lateral head of the gastrocnemius.  IMPRESSION: Anterior lateral tear of the  meniscus  Osteopathic findings C2 flexed rotated and side bent right C7 flexed rotated and side bent left T3 extended rotated and side bent right inhaled third rib T5 extended rotated and side bent left L2 flexed rotated and side bent right Sacrum right on right    Impression and Recommendations:     This case required medical decision making of moderate complexity.      Note: This dictation was prepared with Dragon dictation along with smaller phrase technology. Any transcriptional errors that result from this process are unintentional.

## 2018-03-24 DIAGNOSIS — F4323 Adjustment disorder with mixed anxiety and depressed mood: Secondary | ICD-10-CM | POA: Diagnosis not present

## 2018-04-05 DIAGNOSIS — F4323 Adjustment disorder with mixed anxiety and depressed mood: Secondary | ICD-10-CM | POA: Diagnosis not present

## 2018-04-12 ENCOUNTER — Encounter: Payer: Self-pay | Admitting: Family Medicine

## 2018-04-12 DIAGNOSIS — Z0000 Encounter for general adult medical examination without abnormal findings: Secondary | ICD-10-CM | POA: Diagnosis not present

## 2018-04-12 DIAGNOSIS — E7849 Other hyperlipidemia: Secondary | ICD-10-CM | POA: Diagnosis not present

## 2018-04-12 DIAGNOSIS — R82998 Other abnormal findings in urine: Secondary | ICD-10-CM | POA: Diagnosis not present

## 2018-04-19 DIAGNOSIS — J3089 Other allergic rhinitis: Secondary | ICD-10-CM | POA: Diagnosis not present

## 2018-04-19 DIAGNOSIS — D490 Neoplasm of unspecified behavior of digestive system: Secondary | ICD-10-CM | POA: Diagnosis not present

## 2018-04-19 DIAGNOSIS — G4709 Other insomnia: Secondary | ICD-10-CM | POA: Diagnosis not present

## 2018-04-19 DIAGNOSIS — M1990 Unspecified osteoarthritis, unspecified site: Secondary | ICD-10-CM | POA: Diagnosis not present

## 2018-04-19 DIAGNOSIS — Z0000 Encounter for general adult medical examination without abnormal findings: Secondary | ICD-10-CM | POA: Diagnosis not present

## 2018-04-19 DIAGNOSIS — Z1389 Encounter for screening for other disorder: Secondary | ICD-10-CM | POA: Diagnosis not present

## 2018-04-19 DIAGNOSIS — Z800 Family history of malignant neoplasm of digestive organs: Secondary | ICD-10-CM | POA: Diagnosis not present

## 2018-04-19 DIAGNOSIS — K5790 Diverticulosis of intestine, part unspecified, without perforation or abscess without bleeding: Secondary | ICD-10-CM | POA: Diagnosis not present

## 2018-04-19 DIAGNOSIS — E7849 Other hyperlipidemia: Secondary | ICD-10-CM | POA: Diagnosis not present

## 2018-04-19 DIAGNOSIS — N393 Stress incontinence (female) (male): Secondary | ICD-10-CM | POA: Diagnosis not present

## 2018-04-19 NOTE — Progress Notes (Signed)
[name] [place] [place], [place] [address] Phone: [phone] Subjective:    I [name] am serving as a Education administrator for Dr. Hulan Saas.    CC: Knee pain, back pain follow-up  PFX:[id number]  ANDREA FERRER is a 62 y.o. female coming in with complaint of knee pain. Back is doing well. Increased her miles on her bike. Less pain with standing.  Pain is still more on the lateral aspect of the knee.  Sometimes twisting motion still gives her difficulty.  Still having back pain.  Pain is mild with tightness.  Continues to workout on a regular basis.  States sitting for long amount of time seems to give difficulty.    Past Medical History:  Diagnosis Date  . Cancer ([place])    skin cancer  . Diverticulosis    CT  . Fibroids 09/07/2013  . PONV (postoperative nausea and vomiting)    non recent   . S/P hysterectomy with oophorectomy 09/07/2013  . Seasonal allergies   . SVD (spontaneous vaginal delivery)    x 1   Past Surgical History:  Procedure Laterality Date  . ABDOMINAL HYSTERECTOMY    . COLONOSCOPY     2 or 3  . COLONOSCOPY N/A 01/04/2017   Procedure: COLONOSCOPY;  Surgeon: Danie Binder, MD;  [facility];  Service: Endoscopy;  Laterality: N/A;  10:45am  . DILATION AND CURETTAGE OF UTERUS     x 3  . EUS N/A 11/16/2013   Procedure: UPPER ENDOSCOPIC ULTRASOUND (EUS) LINEAR;  Surgeon: Milus Banister, MD;  [facility];  Service: Endoscopy;  Laterality: N/A;  . EYE SURGERY     lasik bilateral  . LAPAROSCOPY N/A 09/25/2013   Procedure: LAPAROSCOPY OPERATIVE;  Surgeon: Elveria Royals, MD;  Location: [facility];  Service: Gynecology;  Laterality: N/A;  . PANCREATECTOMY N/A 12/07/2013   Procedure: LAPAROSCOPIC DISTAL  PANCREATECTOMY;  Surgeon: Stark Klein, MD;  Location: [facility];  Service: General;  Laterality: N/A;  . POLYPECTOMY  01/04/2017   Procedure: POLYPECTOMY;  Surgeon: Danie Binder, MD;  [facility];   Service: Endoscopy;;  rectal  . ROBOTIC ASSISTED TOTAL HYSTERECTOMY WITH BILATERAL SALPINGO OOPHERECTOMY Bilateral 09/07/2013   Procedure: ROBOTIC ASSISTED TOTAL HYSTERECTOMY WITH BILATERAL SALPINGO OOPHORECTOMY;  Surgeon: Elveria Royals, MD;  Location: [facility];  Service: Gynecology;  Laterality: Bilateral;  . WISDOM TOOTH EXTRACTION     Social History   Socioeconomic History  . Marital status: Married    Spouse name: Not on file  . Number of children: Not on file  . Years of education: Not on file  . Highest education level: Not on file  Occupational History    Employer: [place]: [employer]  Social Needs  . Financial resource strain: Not on file  . Food insecurity:    Worry: Not on file    Inability: Not on file  . Transportation needs:    Medical: Not on file    Non-medical: Not on file  Tobacco Use  . Smoking status: Former Smoker    Packs/day: 1.00    Years: 10.00    Pack years: 10.00    Types: Cigarettes    Last attempt to quit: 01/02/1987    Years since quitting: 31.3  . Smokeless tobacco: Never Used  Substance and Sexual Activity  . Alcohol use: Yes    Comment: socially  . Drug use: No  . Sexual  activity: Not Currently    Birth control/protection: Post-menopausal, Surgical  Lifestyle  . Physical activity:    Days per week: Not on file    Minutes per session: Not on file  . Stress: Not on file  Relationships  . Social connections:    Talks on phone: Not on file    Gets together: Not on file    Attends religious service: Not on file    Active member of club or organization: Not on file    Attends meetings of clubs or organizations: Not on file    Relationship status: Not on file  Other Topics Concern  . Not on file  Social History Narrative  . Not on file   No Known Allergies Family History  Problem Relation Age of Onset  . Cancer Mother 63       colon,lung   . COPD Mother   . Colon cancer Mother     Current Outpatient Medications  (Endocrine & Metabolic):  .  estradiol (VIVELLE-DOT) 0.1 MG/24HR patch, Place 1 patch onto the skin 2 (two) times a week. Wednesdays and Saturdays   Current Outpatient Medications (Respiratory):  .  fexofenadine (ALLEGRA) 60 MG tablet, Take 60 mg by mouth daily as needed for allergies. .  fexofenadine-pseudoephedrine (ALLEGRA-D) 60-120 MG per tablet, Take 1 tablet by mouth daily. .  fluticasone (FLONASE) 50 MCG/ACT nasal spray, Place 1 spray into both nostrils 2 (two) times daily.  .  montelukast (SINGULAIR) 10 MG tablet, Take 10 mg by mouth at bedtime.  Current Outpatient Medications (Analgesics):  .  ibuprofen (ADVIL,MOTRIN) 200 MG tablet, Take 400 mg by mouth daily as needed for headache or moderate pain. .  meloxicam (MOBIC) 15 MG tablet, TAKE 1 TABLET (15 MG TOTAL) BY MOUTH DAILY.   Current Outpatient Medications (Other):  Marland Kitchen  ALPRAZolam (XANAX) 0.5 MG tablet, Take 0.5 mg by mouth daily as needed for anxiety. Marland Kitchen  ascorbic acid (VITAMIN C) 1000 MG tablet, Take 1,000 mg by mouth daily. Marland Kitchen  b complex vitamins tablet, Take 1 tablet by mouth daily. .  Calcium Carb-Cholecalciferol (CALCIUM 600+D3) 600-800 MG-UNIT TABS, Take 1 tablet by mouth daily. .  Diclofenac Sodium (PENNSAID) 2 % SOLN, Place 2 application onto the skin 2 (two) times daily. .  Lactobacillus (ACIDOPHILUS PO), Take 1 tablet by mouth daily. .  Misc Natural Products (TURMERIC CURCUMIN) CAPS, Take 1 capsule by mouth daily. .  Multiple Vitamin (MULTIVITAMIN) tablet, Take 1 tablet by mouth daily. .  Omega-3 Fatty Acids (FISH OIL PO), Take 1 capsule by mouth daily. Vladimir Faster Glycol-Propyl Glycol (SYSTANE OP), Apply 1 drop to eye 2 (two) times daily as needed (dry eyes). .  RESTASIS 0.05 % ophthalmic emulsion, Place 1 drop into both eyes 2 (two) times daily.    Past medical history, social, surgical and family history all reviewed in electronic medical record.  No pertanent information unless stated regarding to the chief  complaint.   Review of Systems:  No headache, visual changes, nausea, vomiting, diarrhea, constipation, dizziness, abdominal pain, skin rash, fevers, chills, night sweats, weight loss, swollen lymph nodes,chest pain, shortness of breath, mood changes.  Positive muscle aches and joint swelling  Objective  Blood pressure 120/80, pulse 64, height 5\' 6"  (1.676 m), weight 151 lb (68.5 kg), SpO2 98 %.    General: No apparent distress alert and oriented x3 mood and affect normal, dressed appropriately.  HEENT: Pupils equal, extraocular movements intact  Respiratory: Patient's speak in full sentences and does  not appear short of breath  Cardiovascular: No lower extremity edema, non tender, no erythema  Skin: Warm dry intact with no signs of infection or rash on extremities or on axial skeleton.  Abdomen: Soft nontender  Neuro: Cranial nerves II through XII are intact, neurovascularly intact in all extremities with 2+ DTRs and 2+ pulses.  Lymph: No lymphadenopathy of posterior or anterior cervical chain or axillae bilaterally.  Gait normal with good balance and coordination.  MSK:  Non tender with full range of motion and good stability and symmetric strength and tone of shoulders, elbows, wrist, hip, and ankles bilaterally.  Knee: Right Normal to inspection with no erythema or effusion or obvious bony abnormalities. Palpation normal with no warmth, joint line tenderness, patellar tenderness, or condyle tenderness. ROM full in flexion and extension and lower leg rotation. Ligaments with solid consistent endpoints including ACL, PCL, LCL, MCL. Positive Mcmurray's, Apley's, and Thessalonian tests. Mild painful patellar compression. Contralateral knee unremarkable  Back exam shows loss of lordosis.  Tender in the paraspinal musculature lumbar spine right greater than left.  Mild positive Corky Sox test on the right.  Full strength in lower extremities.  Negative straight leg test.  Osteopathic  findings C2 flexed rotated and side bent right T5 extended rotated and side bent right inhaled rib T9 extended rotated and side bent left L2 flexed rotated and side bent right Sacrum right on right  After informed written and verbal consent, patient was seated on exam table. Right knee was prepped with alcohol swab and utilizing anterolateral approach, patient's right knee space was injected with 4:1  marcaine 0.5%: Kenalog 40mg /dL. Patient tolerated the procedure well without immediate complications. .    Impression and Recommendations:     This case required medical decision making of moderate complexity. The above documentation has been reviewed and is accurate and complete [name], DO       Note: This dictation was prepared with Dragon dictation along with smaller phrase technology. Any transcriptional errors that result from this process are unintentional.

## 2018-04-20 ENCOUNTER — Ambulatory Visit: Payer: 59 | Admitting: Family Medicine

## 2018-04-20 ENCOUNTER — Encounter: Payer: Self-pay | Admitting: Family Medicine

## 2018-04-20 VITALS — BP 120/80 | HR 64 | Ht 66.0 in | Wt 151.0 lb

## 2018-04-20 DIAGNOSIS — M545 Low back pain, unspecified: Secondary | ICD-10-CM

## 2018-04-20 DIAGNOSIS — M999 Biomechanical lesion, unspecified: Secondary | ICD-10-CM | POA: Diagnosis not present

## 2018-04-20 DIAGNOSIS — S838X1D Sprain of other specified parts of right knee, subsequent encounter: Secondary | ICD-10-CM | POA: Diagnosis not present

## 2018-04-20 DIAGNOSIS — G8929 Other chronic pain: Secondary | ICD-10-CM | POA: Diagnosis not present

## 2018-04-20 DIAGNOSIS — Z1212 Encounter for screening for malignant neoplasm of rectum: Secondary | ICD-10-CM | POA: Diagnosis not present

## 2018-04-20 MED FILL — MONTELUKAST SOD 10 MG TAB: 10 | 90 days supply | Qty: 90 | Fill #2

## 2018-04-20 NOTE — Assessment & Plan Note (Signed)
Injection given today.  Tolerated the procedure well.  Discussed icing regimen and home exercise.  Discussed which activities of doing which wants to avoid.  Patient will increase activity as tolerated.  Follow-up with me again in 4 to 8 weeks

## 2018-04-20 NOTE — Assessment & Plan Note (Signed)
Decision today to treat with OMT was based on Physical Exam  After verbal consent patient was treated with HVLA, ME, FPR techniques in cervical, thoracic, rib lumbar and sacral areas  Patient tolerated the procedure well with improvement in symptoms  Patient given exercises, stretches and lifestyle modifications  See medications in patient instructions if given  Patient will follow up in 4-8 weeks 

## 2018-04-20 NOTE — Assessment & Plan Note (Signed)
Stable overall.  Continues to have some discomfort and pain.  Patient was not having any radicular symptoms.  Has had x-rays and MRI has shown arthritic changes.  Responded well to epidurals in the past.  Responding well to manipulation.  Continue to keep patient active.  Follow-up again in 4 to 8 weeks

## 2018-04-21 MED FILL — ESTRADIOL 0.05 MG PATCH: 0.05 | 84 days supply | Qty: 24 | Fill #3

## 2018-04-28 DIAGNOSIS — F4323 Adjustment disorder with mixed anxiety and depressed mood: Secondary | ICD-10-CM | POA: Diagnosis not present

## 2018-05-12 DIAGNOSIS — F4323 Adjustment disorder with mixed anxiety and depressed mood: Secondary | ICD-10-CM | POA: Diagnosis not present

## 2018-06-01 DIAGNOSIS — L720 Epidermal cyst: Secondary | ICD-10-CM | POA: Diagnosis not present

## 2018-06-07 DIAGNOSIS — F4323 Adjustment disorder with mixed anxiety and depressed mood: Secondary | ICD-10-CM | POA: Diagnosis not present

## 2018-06-09 ENCOUNTER — Other Ambulatory Visit: Payer: Self-pay | Admitting: Family Medicine

## 2018-06-09 MED FILL — MELOXICAM 15 MG TABLET: 15 | 90 days supply | Qty: 90 | Fill #0

## 2018-06-09 NOTE — Telephone Encounter (Signed)
Refill done.  

## 2018-07-12 DIAGNOSIS — Z01419 Encounter for gynecological examination (general) (routine) without abnormal findings: Secondary | ICD-10-CM | POA: Diagnosis not present

## 2018-07-12 DIAGNOSIS — F4323 Adjustment disorder with mixed anxiety and depressed mood: Secondary | ICD-10-CM | POA: Diagnosis not present

## 2018-07-12 DIAGNOSIS — Z6825 Body mass index (BMI) 25.0-25.9, adult: Secondary | ICD-10-CM | POA: Diagnosis not present

## 2018-07-13 MED FILL — ESTRADIOL PATCH 0.0375: 0.0375 | 84 days supply | Qty: 24 | Fill #0

## 2018-07-19 MED FILL — MONTELUKAST SOD 10 MG TAB: 10 | 90 days supply | Qty: 90 | Fill #0

## 2018-07-21 DIAGNOSIS — H04123 Dry eye syndrome of bilateral lacrimal glands: Secondary | ICD-10-CM | POA: Diagnosis not present

## 2018-07-21 DIAGNOSIS — H2513 Age-related nuclear cataract, bilateral: Secondary | ICD-10-CM | POA: Diagnosis not present

## 2018-07-21 DIAGNOSIS — H524 Presbyopia: Secondary | ICD-10-CM | POA: Diagnosis not present

## 2018-07-21 DIAGNOSIS — H52203 Unspecified astigmatism, bilateral: Secondary | ICD-10-CM | POA: Diagnosis not present

## 2018-07-21 DIAGNOSIS — H5211 Myopia, right eye: Secondary | ICD-10-CM | POA: Diagnosis not present

## 2018-08-02 MED FILL — AZITHROMYCIN 250 MG TABS: 250 | 5 days supply | Qty: 6 | Fill #0

## 2018-08-16 MED FILL — RESTASIS 0.05% EYE EMULSION: 0.05 | 90 days supply | Qty: 180 | Fill #1

## 2018-08-16 MED FILL — FLUTICASONE PROP 50 MCG SPR: 50 | 90 days supply | Qty: 48 | Fill #0

## 2018-08-24 DIAGNOSIS — F4323 Adjustment disorder with mixed anxiety and depressed mood: Secondary | ICD-10-CM | POA: Diagnosis not present

## 2018-09-08 MED FILL — HYDROQUINONE 4% CREAM: 4 | 7 days supply | Qty: 28 | Fill #0

## 2018-09-19 ENCOUNTER — Other Ambulatory Visit: Payer: Self-pay | Admitting: Family Medicine

## 2018-09-19 MED FILL — MELOXICAM 15 MG TABLET: 15 | 90 days supply | Qty: 90 | Fill #0

## 2018-09-21 ENCOUNTER — Encounter: Payer: Self-pay | Admitting: Family Medicine

## 2018-09-21 ENCOUNTER — Ambulatory Visit: Payer: 59 | Admitting: Family Medicine

## 2018-09-21 ENCOUNTER — Telehealth: Payer: Self-pay

## 2018-09-21 DIAGNOSIS — S838X1D Sprain of other specified parts of right knee, subsequent encounter: Secondary | ICD-10-CM

## 2018-09-21 NOTE — Telephone Encounter (Signed)
Called patient and scheduled today at 10:15 for knee injection.

## 2018-09-21 NOTE — Assessment & Plan Note (Signed)
Patient doing better overall.  Discussed icing regimen, home exercise.  Discussed icing regimen and home exercise.  Discussed the possibility of Visco supplementation will try to get prior approval.  Follow-up again in 4 weeks

## 2018-09-21 NOTE — Patient Instructions (Signed)
Good to see you  We will get you approved for monovisc Can do this injection every 3 months See me again as scheduled or in 1 month

## 2018-09-21 NOTE — Progress Notes (Signed)
[name] [address] [place], [place] [address] Phone: [phone] Subjective:    I [name] am serving as a Education administrator for Dr. Hulan Saas.   CC: Right knee pain  [id number]  [name] is a 63 y.o. female coming in with complaint of knee pain. Wants knee injection.  Patient is seen previously.  Last injection was in September 2019 for a meniscal injury.  Has been pain-free since then.  Had recently gone on a long trip where patient did a lot of sitting for hours on and and then a lot of walking usually with vertical elevation.  Noticed more pain, maybe mild swelling, just more soreness.  Made it difficult to do workouts on a regular basis.     Past Medical History:  Diagnosis Date  . Cancer ([place])    skin cancer  . Diverticulosis    CT  . Fibroids 09/07/2013  . PONV (postoperative nausea and vomiting)    non recent   . S/P hysterectomy with oophorectomy 09/07/2013  . Seasonal allergies   . SVD (spontaneous vaginal delivery)    x 1   Past Surgical History:  Procedure Laterality Date  . ABDOMINAL HYSTERECTOMY    . COLONOSCOPY     2 or 3  . COLONOSCOPY N/A 01/04/2017   Procedure: COLONOSCOPY;  Surgeon: Danie Binder, MD;  [facility];  Service: Endoscopy;  Laterality: N/A;  10:45am  . DILATION AND CURETTAGE OF UTERUS     x 3  . EUS N/A 11/16/2013   Procedure: UPPER ENDOSCOPIC ULTRASOUND (EUS) LINEAR;  Surgeon: Milus Banister, MD;  [facility];  Service: Endoscopy;  Laterality: N/A;  . EYE SURGERY     lasik bilateral  . LAPAROSCOPY N/A 09/25/2013   Procedure: LAPAROSCOPY OPERATIVE;  Surgeon: Elveria Royals, MD;  Location: [facility];  Service: Gynecology;  Laterality: N/A;  . PANCREATECTOMY N/A 12/07/2013   Procedure: LAPAROSCOPIC DISTAL  PANCREATECTOMY;  Surgeon: Stark Klein, MD;  Location: [facility];  Service: General;  Laterality: N/A;  . POLYPECTOMY  01/04/2017   Procedure: POLYPECTOMY;  Surgeon: Danie Binder,  MD;  [facility];  Service: Endoscopy;;  rectal  . ROBOTIC ASSISTED TOTAL HYSTERECTOMY WITH BILATERAL SALPINGO OOPHERECTOMY Bilateral 09/07/2013   Procedure: ROBOTIC ASSISTED TOTAL HYSTERECTOMY WITH BILATERAL SALPINGO OOPHORECTOMY;  Surgeon: Elveria Royals, MD;  Location: [facility];  Service: Gynecology;  Laterality: Bilateral;  . WISDOM TOOTH EXTRACTION     Social History   Socioeconomic History  . Marital status: Married    Spouse name: Not on file  . Number of children: Not on file  . Years of education: Not on file  . Highest education level: Not on file  Occupational History    Employer: [place]: [employer]  Social Needs  . Financial resource strain: Not on file  . Food insecurity:    Worry: Not on file    Inability: Not on file  . Transportation needs:    Medical: Not on file    Non-medical: Not on file  Tobacco Use  . Smoking status: Former Smoker    Packs/day: 1.00    Years: 10.00    Pack years: 10.00    Types: Cigarettes    Last attempt to quit: 01/02/1987    Years since quitting: 31.7  . Smokeless tobacco: Never Used  Substance and Sexual Activity  . Alcohol use: Yes    Comment: socially  .  Drug use: No  . Sexual activity: Not Currently    Birth control/protection: Post-menopausal, Surgical  Lifestyle  . Physical activity:    Days per week: Not on file    Minutes per session: Not on file  . Stress: Not on file  Relationships  . Social connections:    Talks on phone: Not on file    Gets together: Not on file    Attends religious service: Not on file    Active member of club or organization: Not on file    Attends meetings of clubs or organizations: Not on file    Relationship status: Not on file  Other Topics Concern  . Not on file  Social History Narrative  . Not on file   No Known Allergies Family History  Problem Relation Age of Onset  . Cancer Mother 53       colon,lung   . COPD Mother   . Colon cancer Mother      Current Outpatient Medications (Endocrine & Metabolic):  .  estradiol (VIVELLE-DOT) 0.1 MG/24HR patch, Place 1 patch onto the skin 2 (two) times a week. Wednesdays and Saturdays   Current Outpatient Medications (Respiratory):  .  fexofenadine (ALLEGRA) 60 MG tablet, Take 60 mg by mouth daily as needed for allergies. .  fexofenadine-pseudoephedrine (ALLEGRA-D) 60-120 MG per tablet, Take 1 tablet by mouth daily. .  fluticasone (FLONASE) 50 MCG/ACT nasal spray, Place 1 spray into both nostrils 2 (two) times daily.  .  montelukast (SINGULAIR) 10 MG tablet, Take 10 mg by mouth at bedtime.  Current Outpatient Medications (Analgesics):  .  ibuprofen (ADVIL,MOTRIN) 200 MG tablet, Take 400 mg by mouth daily as needed for headache or moderate pain. .  meloxicam (MOBIC) 15 MG tablet, TAKE 1 TABLET BY MOUTH DAILY.   Current Outpatient Medications (Other):  Marland Kitchen  ALPRAZolam (XANAX) 0.5 MG tablet, Take 0.5 mg by mouth daily as needed for anxiety. Marland Kitchen  ascorbic acid (VITAMIN C) 1000 MG tablet, Take 1,000 mg by mouth daily. Marland Kitchen  b complex vitamins tablet, Take 1 tablet by mouth daily. .  Calcium Carb-Cholecalciferol (CALCIUM 600+D3) 600-800 MG-UNIT TABS, Take 1 tablet by mouth daily. .  Diclofenac Sodium (PENNSAID) 2 % SOLN, Place 2 application onto the skin 2 (two) times daily. .  Lactobacillus (ACIDOPHILUS PO), Take 1 tablet by mouth daily. .  Misc Natural Products (TURMERIC CURCUMIN) CAPS, Take 1 capsule by mouth daily. .  Multiple Vitamin (MULTIVITAMIN) tablet, Take 1 tablet by mouth daily. .  Omega-3 Fatty Acids (FISH OIL PO), Take 1 capsule by mouth daily. Vladimir Faster Glycol-Propyl Glycol (SYSTANE OP), Apply 1 drop to eye 2 (two) times daily as needed (dry eyes). .  RESTASIS 0.05 % ophthalmic emulsion, Place 1 drop into both eyes 2 (two) times daily.    Past medical history, social, surgical and family history all reviewed in electronic medical record.  No pertanent information unless stated  regarding to the chief complaint.   Review of Systems:  No headache, visual changes, nausea, vomiting, diarrhea, constipation, dizziness, abdominal pain, skin rash, fevers, chills, night sweats, weight loss, swollen lymph nodes, body aches, joint swelling, \chest pain, shortness of breath, mood changes.  Positive muscle aches  Objective  Blood pressure 136/70, pulse (!) 54, height 5\' 6"  (1.676 m), SpO2 98 %.    General: No apparent distress alert and oriented x3 mood and affect normal, dressed appropriately.  HEENT: Pupils equal, extraocular movements intact  Respiratory: Patient's speak in full sentences and  does not appear short of breath  Cardiovascular: No lower extremity edema, non tender, no erythema  Skin: Warm dry intact with no signs of infection or rash on extremities or on axial skeleton.  Abdomen: Soft nontender  Neuro: Cranial nerves II through XII are intact, neurovascularly intact in all extremities with 2+ DTRs and 2+ pulses.  Lymph: No lymphadenopathy of posterior or anterior cervical chain or axillae bilaterally.  Gait normal with good balance and coordination.  MSK:  Non tender with full range of motion and good stability and symmetric strength and tone of shoulders, elbows, wrist, hip and ankles bilaterally.  Right knee exam does have some mild osteoarthritic changes.  Lacks last 5 degrees of flexion and lacks 5 degrees of extension.  Mild positive McMurray still noted.  Pain over the medial compartment.  No significant instability though noted  After informed written and verbal consent, patient was seated on exam table. Right knee was prepped with alcohol swab and utilizing anterolateral approach, patient's right knee space was injected with 4:1  marcaine 0.5%: Kenalog 40mg /dL. Patient tolerated the procedure well without immediate complications.    Impression and Recommendations:     This case required medical decision making of moderate complexity. The above  documentation has been reviewed and is accurate and complete [name], DO       Note: This dictation was prepared with Dragon dictation along with smaller phrase technology. Any transcriptional errors that result from this process are unintentional.

## 2018-09-26 ENCOUNTER — Encounter: Payer: Self-pay | Admitting: Family Medicine

## 2018-09-26 ENCOUNTER — Ambulatory Visit: Payer: 59 | Admitting: Family Medicine

## 2018-09-28 DIAGNOSIS — F4323 Adjustment disorder with mixed anxiety and depressed mood: Secondary | ICD-10-CM | POA: Diagnosis not present

## 2018-10-13 MED FILL — MONTELUKAST SOD 10 MG TAB: 10 | 90 days supply | Qty: 90 | Fill #1

## 2018-10-13 MED FILL — ESTRADIOL PATCH 0.0375: 0.0375 | 84 days supply | Qty: 24 | Fill #1

## 2018-10-13 MED FILL — AZITHROMYCIN 250 MG TABLET: 250 | 5 days supply | Qty: 6 | Fill #0

## 2018-10-18 ENCOUNTER — Other Ambulatory Visit: Payer: Self-pay

## 2018-10-18 ENCOUNTER — Ambulatory Visit: Payer: 59 | Admitting: Family Medicine

## 2018-10-18 ENCOUNTER — Encounter: Payer: Self-pay | Admitting: Family Medicine

## 2018-10-18 DIAGNOSIS — S838X1D Sprain of other specified parts of right knee, subsequent encounter: Secondary | ICD-10-CM

## 2018-10-18 NOTE — Progress Notes (Signed)
[name] [address] [place], [place] [address] Phone: [phone] Subjective:   I [name] am serving as a Education administrator for Dr. Hulan Saas.    CC: Right knee pain  QAS:[id number]   09/21/2018 Patient doing better overall.  Discussed icing regimen, home exercise.  Discussed icing regimen and home exercise.  Discussed the possibility of Visco supplementation will try to get prior approval.  Follow-up again in 4 weeks  Updated 10/18/2018 [name] is a 63 y.o. female coming in with complaint of right knee pain. Went skiing recently. Still has pain on the lateral side. Patient has known mild arthritic changes.  Did respond well to the injection.  Not having much pain at all at the moment.    Past Medical History:  Diagnosis Date  . Cancer ([place])    skin cancer  . Diverticulosis    CT  . Fibroids 09/07/2013  . PONV (postoperative nausea and vomiting)    non recent   . S/P hysterectomy with oophorectomy 09/07/2013  . Seasonal allergies   . SVD (spontaneous vaginal delivery)    x 1   Past Surgical History:  Procedure Laterality Date  . ABDOMINAL HYSTERECTOMY    . COLONOSCOPY     2 or 3  . COLONOSCOPY N/A 01/04/2017   Procedure: COLONOSCOPY;  Surgeon: Danie Binder, MD;  [facility];  Service: Endoscopy;  Laterality: N/A;  10:45am  . DILATION AND CURETTAGE OF UTERUS     x 3  . EUS N/A 11/16/2013   Procedure: UPPER ENDOSCOPIC ULTRASOUND (EUS) LINEAR;  Surgeon: Milus Banister, MD;  [facility];  Service: Endoscopy;  Laterality: N/A;  . EYE SURGERY     lasik bilateral  . LAPAROSCOPY N/A 09/25/2013   Procedure: LAPAROSCOPY OPERATIVE;  Surgeon: Elveria Royals, MD;  Location: [facility];  Service: Gynecology;  Laterality: N/A;  . PANCREATECTOMY N/A 12/07/2013   Procedure: LAPAROSCOPIC DISTAL  PANCREATECTOMY;  Surgeon: Stark Klein, MD;  Location: [facility];  Service: General;  Laterality: N/A;  . POLYPECTOMY  01/04/2017   Procedure:  POLYPECTOMY;  Surgeon: Danie Binder, MD;  [facility];  Service: Endoscopy;;  rectal  . ROBOTIC ASSISTED TOTAL HYSTERECTOMY WITH BILATERAL SALPINGO OOPHERECTOMY Bilateral 09/07/2013   Procedure: ROBOTIC ASSISTED TOTAL HYSTERECTOMY WITH BILATERAL SALPINGO OOPHORECTOMY;  Surgeon: Elveria Royals, MD;  Location: [facility];  Service: Gynecology;  Laterality: Bilateral;  . WISDOM TOOTH EXTRACTION     Social History   Socioeconomic History  . Marital status: Married    Spouse name: Not on file  . Number of children: Not on file  . Years of education: Not on file  . Highest education level: Not on file  Occupational History    Employer: [place]: [employer]  Social Needs  . Financial resource strain: Not on file  . Food insecurity:    Worry: Not on file    Inability: Not on file  . Transportation needs:    Medical: Not on file    Non-medical: Not on file  Tobacco Use  . Smoking status: Former Smoker    Packs/day: 1.00    Years: 10.00    Pack years: 10.00    Types: Cigarettes    Last attempt to quit: 01/02/1987    Years since quitting: 31.8  . Smokeless tobacco: Never Used  Substance and Sexual Activity  . Alcohol use: Yes    Comment: socially  .  Drug use: No  . Sexual activity: Not Currently    Birth control/protection: Post-menopausal, Surgical  Lifestyle  . Physical activity:    Days per week: Not on file    Minutes per session: Not on file  . Stress: Not on file  Relationships  . Social connections:    Talks on phone: Not on file    Gets together: Not on file    Attends religious service: Not on file    Active member of club or organization: Not on file    Attends meetings of clubs or organizations: Not on file    Relationship status: Not on file  Other Topics Concern  . Not on file  Social History Narrative  . Not on file   No Known Allergies Family History  Problem Relation Age of Onset  . Cancer Mother 25       colon,lung   .  COPD Mother   . Colon cancer Mother     Current Outpatient Medications (Endocrine & Metabolic):  .  estradiol (VIVELLE-DOT) 0.1 MG/24HR patch, Place 1 patch onto the skin 2 (two) times a week. Wednesdays and Saturdays   Current Outpatient Medications (Respiratory):  .  fexofenadine (ALLEGRA) 60 MG tablet, Take 60 mg by mouth daily as needed for allergies. .  fexofenadine-pseudoephedrine (ALLEGRA-D) 60-120 MG per tablet, Take 1 tablet by mouth daily. .  fluticasone (FLONASE) 50 MCG/ACT nasal spray, Place 1 spray into both nostrils 2 (two) times daily.  .  montelukast (SINGULAIR) 10 MG tablet, Take 10 mg by mouth at bedtime.  Current Outpatient Medications (Analgesics):  .  ibuprofen (ADVIL,MOTRIN) 200 MG tablet, Take 400 mg by mouth daily as needed for headache or moderate pain. .  meloxicam (MOBIC) 15 MG tablet, TAKE 1 TABLET BY MOUTH DAILY.   Current Outpatient Medications (Other):  Marland Kitchen  ALPRAZolam (XANAX) 0.5 MG tablet, Take 0.5 mg by mouth daily as needed for anxiety. Marland Kitchen  ascorbic acid (VITAMIN C) 1000 MG tablet, Take 1,000 mg by mouth daily. Marland Kitchen  b complex vitamins tablet, Take 1 tablet by mouth daily. .  Calcium Carb-Cholecalciferol (CALCIUM 600+D3) 600-800 MG-UNIT TABS, Take 1 tablet by mouth daily. .  Diclofenac Sodium (PENNSAID) 2 % SOLN, Place 2 application onto the skin 2 (two) times daily. .  Lactobacillus (ACIDOPHILUS PO), Take 1 tablet by mouth daily. .  Misc Natural Products (TURMERIC CURCUMIN) CAPS, Take 1 capsule by mouth daily. .  Multiple Vitamin (MULTIVITAMIN) tablet, Take 1 tablet by mouth daily. .  Omega-3 Fatty Acids (FISH OIL PO), Take 1 capsule by mouth daily. Vladimir Faster Glycol-Propyl Glycol (SYSTANE OP), Apply 1 drop to eye 2 (two) times daily as needed (dry eyes). .  RESTASIS 0.05 % ophthalmic emulsion, Place 1 drop into both eyes 2 (two) times daily.    Past medical history, social, surgical and family history all reviewed in electronic medical record.  No  pertanent information unless stated regarding to the chief complaint.   Review of Systems:  No headache, visual changes, nausea, vomiting, diarrhea, constipation, dizziness, abdominal pain, skin rash, fevers, chills, night sweats, weight loss, swollen lymph nodes, body aches, joint swelling, muscle aches, chest pain, shortness of breath, mood changes.   Objective  Blood pressure 130/64, pulse 61, height 5\' 6"  (1.676 m), SpO2 98 %. Systems examined below as of    General: No apparent distress alert and oriented x3 mood and affect normal, dressed appropriately.  HEENT: Pupils equal, extraocular movements intact  Respiratory: Patient's speak in  full sentences and does not appear short of breath  Cardiovascular: No lower extremity edema, non tender, no erythema  Skin: Warm dry intact with no signs of infection or rash on extremities or on axial skeleton.  Abdomen: Soft nontender  Neuro: Cranial nerves II through XII are intact, neurovascularly intact in all extremities with 2+ DTRs and 2+ pulses.  Lymph: No lymphadenopathy of posterior or anterior cervical chain or axillae bilaterally.  Gait normal with good balance and coordination.  MSK:  Non tender with full range of motion and good stability and symmetric strength and tone of shoulders, elbows, wrist, hip, and ankles bilaterally.  Right knee exam has full range of motion.  Mild pain over the lateral compartment. Patient do not does not have any significant instability.    Impression and Recommendations:    . The above documentation has been reviewed and is accurate and complete [name], DO       Note: This dictation was prepared with Dragon dictation along with smaller phrase technology. Any transcriptional errors that result from this process are unintentional.

## 2018-10-18 NOTE — Assessment & Plan Note (Signed)
Doing well at this time.  No significant change at this point.  Discussed icing regimen and home exercise.  Which activities to do which wants to avoid.  Increase activity slowly.  Follow-up again in 4 to 8 weeks.

## 2018-10-18 NOTE — Patient Instructions (Signed)
You are awesome Alvera Singh is your friend Choline 500mg  daily  See me when you need me

## 2018-10-21 MED FILL — FLUTICASONE PROP 50 MCG SPR: 50 | 90 days supply | Qty: 48 | Fill #1

## 2018-11-08 DIAGNOSIS — D2222 Melanocytic nevi of left ear and external auricular canal: Secondary | ICD-10-CM | POA: Diagnosis not present

## 2018-11-08 DIAGNOSIS — D2272 Melanocytic nevi of left lower limb, including hip: Secondary | ICD-10-CM | POA: Diagnosis not present

## 2018-11-08 DIAGNOSIS — L814 Other melanin hyperpigmentation: Secondary | ICD-10-CM | POA: Diagnosis not present

## 2018-11-08 DIAGNOSIS — D225 Melanocytic nevi of trunk: Secondary | ICD-10-CM | POA: Diagnosis not present

## 2018-11-08 DIAGNOSIS — D2261 Melanocytic nevi of right upper limb, including shoulder: Secondary | ICD-10-CM | POA: Diagnosis not present

## 2018-11-08 DIAGNOSIS — D485 Neoplasm of uncertain behavior of skin: Secondary | ICD-10-CM | POA: Diagnosis not present

## 2018-11-08 DIAGNOSIS — L821 Other seborrheic keratosis: Secondary | ICD-10-CM | POA: Diagnosis not present

## 2018-11-08 DIAGNOSIS — Z85828 Personal history of other malignant neoplasm of skin: Secondary | ICD-10-CM | POA: Diagnosis not present

## 2018-12-01 DIAGNOSIS — F4323 Adjustment disorder with mixed anxiety and depressed mood: Secondary | ICD-10-CM | POA: Diagnosis not present

## 2018-12-12 ENCOUNTER — Telehealth: Payer: Self-pay | Admitting: Gastroenterology

## 2018-12-12 NOTE — Telephone Encounter (Signed)
PATIENT CALLED TO SCHEDULE A VIRTUAL VISIT, SHE HAS NOT BEEN SEEN IN THE OFFICE BUT STATED A FEW YEARS AGO SHE SAW [facility] WHEN SHE WENT TO THE ER, CAN I SCHEDULE HER AS A VIRTUAL?  SHE ALSO HAS A HISTORY WITH [facility].

## 2018-12-12 NOTE — Telephone Encounter (Signed)
Ok to schedule a virtual office visit  It appears the patient had an EUS done with Dr. Ardis Hughs

## 2018-12-12 NOTE — Telephone Encounter (Signed)
PATIENT SCHEDULED FOR A VIRTUAL APPOINTMENT TOMORROW WITH LESLIE LEWIS

## 2018-12-13 ENCOUNTER — Encounter: Payer: Self-pay | Admitting: Gastroenterology

## 2018-12-13 ENCOUNTER — Ambulatory Visit (INDEPENDENT_AMBULATORY_CARE_PROVIDER_SITE_OTHER): Payer: 59 | Admitting: Gastroenterology

## 2018-12-13 ENCOUNTER — Other Ambulatory Visit: Payer: Self-pay

## 2018-12-13 DIAGNOSIS — R197 Diarrhea, unspecified: Secondary | ICD-10-CM

## 2018-12-13 DIAGNOSIS — R1010 Upper abdominal pain, unspecified: Secondary | ICD-10-CM

## 2018-12-13 NOTE — Progress Notes (Addendum)
REVIEWED. CALLED PT. NO DIARRHEA, ENERGY LEVEL IS BETTER BUT HER APPETITE IS NOT QUITE RIGHT. PMHX: IPMN WITH INTERMEDIATE GRADE DYSPLASIA/DISTAL PANCREATECTOMY IN 2015 AT [facility]. ACUTE ABDOMINAL PAIN ASSOCIATED WITH DIARRHEA MOST LIKELY DUE TO ACUTE VIRAL ILLNESS OR STEATORRHEA IN THE SETTING OF PARTIAL PANCREATECTOMY. LAST CT ABD/PELVIS W/ IVC 2018: [facility]. PT HAD CT IN 2015, 2016, 2018.  CT SCAN ABD W/ IVC TO SURVEY HER PANCREAS. SHE LIKELY NEEDS A CT EVERY YEAR BUT SHE CAN DISCUSS WITH HER ONCOLOGY DR AT [facility] OR ESTABLISH CARE WITH ONCOLOGY IN [place]. SHE SHOULD TAKE CREON 2-3 WHEN SHE EATS A HIGH FAT MEAL TO AVOID ABDOMINAL PAIN AND DIARRHEA. WE CAN PROVIDE SAMPLES OR SEND IN A Rx. NO INDICATION FOR COLONOSCOPY  AT THIS TIME.  MAY 28: CT ABD W/ IVC MAY 2020: [facility].  Primary Care Physician:  Prince Solian, MD Primary GI:  Barney Drain, MD   Patient Location: Home  Provider Location: [place] office  Reason for Visit: abdominal pain  Persons present on the virtual encounter, with roles: Patient, myself (provider), Zara Council, LPN (updated meds and allergies)  Total time (minutes) spent on medical discussion: 20 minutes  Due to COVID-19, visit was conducted using [email or web address] method.  Visit was requested by patient.  Virtual Visit via [email or web address]  I connected with Miyeko Mahlum Beauregard on 12/13/18 at 11:30 AM EDT by [email or web address] and verified that I am speaking with the correct person using two identifiers.   I discussed the limitations, risks, security and privacy concerns of performing an evaluation and management service by telephone/video and the availability of in person appointments. I also discussed with the patient that there may be a patient responsible charge related to this service. The patient expressed understanding and agreed to proceed.  Chief Complaint  Patient presents with  . Abdominal Pain    "all over belly"; occurred Sunday and went away; felt sluggish  . Diarrhea    occurred Sunday  night    HPI:   DEVOIRY CORRIHER is a 63 y.o. female who presents for virtual visit regarding abdominal pain. Patient was evaluated by Dr. Oneida Alar in 2018 for abdominal pain and found to have acute diverticulitis. 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   Last colonoscopy in 01/2017, significant looping in rectosigmoid colon, 4 mm hyperplastic polyp removed from the rectum, internal hemorrhoids.  Next colonoscopy 5 to 10 years with MAC and ultraslim colonoscope.  Patient woke up Sunday morning with severe abdominal pain, upper abdomen and into both flanks.  No urinary symptoms.  No fever.  Had some diarrhea.  Initially thought it was similar to when she had diverticulitis.  Pain subsided within that morning and for the remainder of the day she felt lethargic.  Had ongoing abdominal soreness like she had intense ab workout.  She had some diarrhea.  Monday morning she felt better but not 100%.  Her abdominal pain is improved today as well.  Having some alternating diarrhea with constipation.  At baseline typically 2 normal bowel movements daily.  No melena or rectal bleeding.  No recent ill contacts.  No medication changes.  She is concerned whether movement created contributing to her symptoms although she has been on this for about a year.  She has done wonders for her arthritic pain.  Approximately a week ago she had some diarrhea shortly after eating chicken salad, she felt like it  was probably bad.  Symptoms resolved quickly and did fine throughout the week.  Denies any heartburn.  No unintentional weight loss.  No recent medication changes.  She notes this is the 5-year anniversary of her pancreatic surgery.  She wonders if she is concerned about this.  Rarely ever takes Advil now that she is on Mobic.  Current Outpatient Medications  Medication Sig Dispense Refill  . ALPRAZolam (XANAX) 0.5 MG tablet Take  0.5 mg by mouth daily as needed for anxiety.    Marland Kitchen ascorbic acid (VITAMIN C) 1000 MG tablet Take 1,000 mg by mouth daily.    Marland Kitchen b complex vitamins tablet Take 1 tablet by mouth daily.    . Calcium Carb-Cholecalciferol (CALCIUM 600+D3) 600-800 MG-UNIT TABS Take 1 tablet by mouth daily.    . Diclofenac Sodium (PENNSAID) 2 % SOLN Place 2 application onto the skin 2 (two) times daily. (Patient taking differently: Place 2 application onto the skin daily. ) 112 g 3  . estradiol (VIVELLE-DOT) 0.1 MG/24HR patch Place 1 patch onto the skin 2 (two) times a week. Wednesdays and Saturdays    . fexofenadine-pseudoephedrine (ALLEGRA-D) 60-120 MG per tablet Take 1 tablet by mouth daily.    . fluticasone (FLONASE) 50 MCG/ACT nasal spray Place 1 spray into both nostrils daily.     Marland Kitchen ibuprofen (ADVIL,MOTRIN) 200 MG tablet Take 400 mg by mouth daily as needed for headache or moderate pain.    . Lactobacillus (ACIDOPHILUS PO) Take 1 tablet by mouth daily.    . meloxicam (MOBIC) 15 MG tablet TAKE 1 TABLET BY MOUTH DAILY. 90 tablet 0  . Multiple Vitamin (MULTIVITAMIN) tablet Take 1 tablet by mouth daily.    . Omega-3 Fatty Acids (FISH OIL PO) Take 1 capsule by mouth daily.    Vladimir Faster Glycol-Propyl Glycol (SYSTANE OP) Apply 1 drop to eye 2 (two) times daily as needed (dry eyes).    . RESTASIS 0.05 % ophthalmic emulsion Place 1 drop into both eyes 2 (two) times daily.     No current facility-administered medications for this visit.     Past Medical History:  Diagnosis Date  . Cancer ([place])    skin cancer  . Diverticulosis    CT  . Fibroids 09/07/2013  . PONV (postoperative nausea and vomiting)    non recent   . S/P hysterectomy with oophorectomy 09/07/2013  . Seasonal allergies   . SVD (spontaneous vaginal delivery)    x 1    Past Surgical History:  Procedure Laterality Date  . ABDOMINAL HYSTERECTOMY    . COLONOSCOPY     2 or 3  . COLONOSCOPY N/A 01/04/2017   Dr. Oneida Alar: Significant looping of the  rectosigmoid colon, 4 mm hyperplastic polyp removed from rectum, internal hemorrhoids.  Next colonoscopy in 5 to 10 years with MAC and ultra slim colonoscope.  Marland Kitchen DILATION AND CURETTAGE OF UTERUS     x 3  . EUS N/A 11/16/2013   Dr. Ardis Hughs: parenchyma in tail of pancreas suggests chronic pancreatitis, abrupt changes in main pancreatic duct in tail becomes dilated up to 4-78m, numerous associated dilated side branches, no solid mass. bx most c/w IPMN  . EYE SURGERY     lasik bilateral  . LAPAROSCOPY N/A 09/25/2013   Procedure: LAPAROSCOPY OPERATIVE;  Surgeon: VElveria Royals MD;  [facility];  Service: Gynecology;  Laterality: N/A;  . PANCREATECTOMY N/A 12/07/2013   Procedure: LAPAROSCOPIC DISTAL  PANCREATECTOMY;  Surgeon: FStark Klein MD;  [facility]  Service: General;  Laterality: N/A;  . POLYPECTOMY  01/04/2017   Procedure: POLYPECTOMY;  Surgeon: Danie Binder, MD;  [facility];  Service: Endoscopy;;  rectal  . ROBOTIC ASSISTED TOTAL HYSTERECTOMY WITH BILATERAL SALPINGO OOPHERECTOMY Bilateral 09/07/2013   Procedure: ROBOTIC ASSISTED TOTAL HYSTERECTOMY WITH BILATERAL SALPINGO OOPHORECTOMY;  Surgeon: Elveria Royals, MD;  Location: [facility];  Service: Gynecology;  Laterality: Bilateral;  . WISDOM TOOTH EXTRACTION      Family History  Problem Relation Age of Onset  . Cancer Mother 58       colon,lung   . COPD Mother   . Colon cancer Mother     Social History   Socioeconomic History  . Marital status: Married    Spouse name: Not on file  . Number of children: Not on file  . Years of education: Not on file  . 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 Financial resource strain: Not on file  . Food insecurity:    Worry: Not on file    Inability: Not on file  . Transportation needs:    Medical: Not on file    Non-medical: Not on file  Tobacco Use  .  Smoking status: Former Smoker    Packs/day: 1.00    Years: 10.00    Pack years: 10.00    Types: Cigarettes    Last attempt to quit: 01/02/1987    Years since quitting: 31.9  . Smokeless tobacco: Never Used  Substance and Sexual Activity  . Alcohol use: Yes    Comment: 2-3 glases of wine on weekend nights  . Drug use: No  . Sexual activity: Not Currently    Birth control/protection: Post-menopausal, Surgical  Lifestyle  . Physical activity:    Days per week: Not on file    Minutes per session: Not on file  . Stress: Not on file  Relationships  . Social connections:    Talks on phone: Not on file    Gets together: Not on file    Attends religious service: Not on file    Active member of club or organization: Not on file    Attends meetings of clubs or organizations: Not on file    Relationship status: Not on file  . Intimate partner violence:    Fear of current or ex partner: Not on file    Emotionally abused: Not on file    Physically abused: Not on file    Forced sexual activity: Not on file  Other Topics Concern  . Not on file  Social History Narrative  . Not on file      ROS:  General: Negative for anorexia, weight loss, fever, chills, fatigue, weakness. Eyes: Negative for vision changes.  ENT: Negative for hoarseness, difficulty swallowing , nasal congestion. CV: Negative for chest pain, angina, palpitations, dyspnea on exertion, peripheral edema.  Respiratory: Negative for dyspnea at rest, dyspnea on exertion, cough, sputum, wheezing.  GI: See history of present illness. GU:  Negative for dysuria, hematuria, urinary incontinence, urinary frequency, nocturnal urination.  MS: Negative for joint pain, low back pain.  Derm: Negative for rash or itching.  Neuro: Negative for weakness, abnormal sensation, seizure, frequent headaches, memory loss, confusion.  Psych: Negative for anxiety, depression, suicidal ideation, hallucinations.  Endo: Negative for unusual weight  change.  Heme: Negative for bruising or bleeding. Allergy: Negative for  rash or hives.   Observations/Objective:  Very pleasant well-nourished well-developed Caucasian female in no acute distress.  Otherwise exam unavailable.  No recent labs.  Older labs noted below.  Lab Results  Component Value Date   CREATININE 0.95 11/20/2016   BUN 11 11/20/2016   NA 136 11/20/2016   K 4.2 11/20/2016   CL 101 11/20/2016   CO2 26 11/20/2016   Lab Results  Component Value Date   ALT 10 11/20/2016   AST 13 11/20/2016   ALKPHOS 42 11/20/2016   BILITOT 1.0 11/20/2016   Lab Results  Component Value Date   WBC 5.4 01/26/2018   HGB 12.8 01/26/2018   HCT 38.4 01/26/2018   MCV 90.0 01/26/2018   PLT 197.0 01/26/2018   Lab Results  Component Value Date   IRON 95 01/26/2018   Lab Results  Component Value Date   VITAMINB12 523 01/26/2018   No results found for: FOLATE Lab Results  Component Value Date   ESRSEDRATE 8 01/26/2018    Assessment and Plan: Pleasant 63 year old female presenting for acute onset upper abdominal pain with radiation to bilateral flanks associated with diarrhea lasted less than 24 hours.  Symptoms gradually improving although not 100% yet.  Some alternating constipation and diarrhea for last 24 hours.  Overall she is feeling much better.  Doubt we are dealing with diverticulitis, biliary etiology, pancreatitic source. Given acute onset and quick improvement of symptoms could have been related to something she ate, acute gastroenteritis. Provided patient with reassurance. Will plan on monitoring for resolution over the next couple days.  If worsening symptoms or persistent symptoms she will let me know.  We will also discuss with Dr. fields to get her input as well.  Right now patient is not interested in pursuing antibiotic therapy, CT as long as her symptoms improved.  She did ask about ongoing Mobic use, we talked about options of continuing at current dose, considering  decreasing dose but I feel her symptoms are not likely related to Mobic.  We did discuss possibility of adding PPI for GI protection but she declined at this time.  Follow Up Instructions:    I discussed the assessment and treatment plan with the patient. The patient was provided an opportunity to ask questions and all were answered. The patient agreed with the plan and demonstrated an understanding of the instructions. AVS mailed to patient's home address.   The patient was advised to call back or seek an in-person evaluation if the symptoms worsen or if the condition fails to improve as anticipated.  I provided 20 minutes of virtual face-to-face time during this encounter.   Neil Crouch, PA-C

## 2018-12-13 NOTE — Patient Instructions (Signed)
1. Continue to monitor your symptoms for now.  If your abdominal pain does not completely resolve or you continue to have bowel concerns, please let me know. 2. I will discuss your recent symptoms with Dr. Oneida Alar, if any additional recommendations are provided, I will let you know. 3. If you have any issues with abdominal pain or needing input from [place] after hours, you can contact the hospital operator at [phone] and request to speak to the on-call provider.

## 2018-12-14 NOTE — Progress Notes (Signed)
cc'ed to pcp °

## 2018-12-15 DIAGNOSIS — F4323 Adjustment disorder with mixed anxiety and depressed mood: Secondary | ICD-10-CM | POA: Diagnosis not present

## 2018-12-20 MED ORDER — PANCRELIPASE (LIP-PROT-AMYL) 36000-114000 UNITS PO CPEP: ORAL_CAPSULE | ORAL | 11 refills | Status: DC

## 2018-12-20 NOTE — Progress Notes (Addendum)
See [facility] recommendations. She spoke to patient but need further assistance.   See her order for CT scan Abd with IVC to survey her pancreas.   Offer samples of Creon 36,000 take 2-3 with high fat meals up to 8 per day, we can send in rx  For 240 with 3 refills if she prefers.

## 2018-12-20 NOTE — Addendum Note (Signed)
Addended by: Barney Drain L on: 12/20/2018 12:00 PM   Modules accepted: Orders

## 2018-12-21 ENCOUNTER — Telehealth: Payer: Self-pay | Admitting: *Deleted

## 2018-12-21 DIAGNOSIS — R1010 Upper abdominal pain, unspecified: Secondary | ICD-10-CM

## 2018-12-21 DIAGNOSIS — R197 Diarrhea, unspecified: Secondary | ICD-10-CM

## 2018-12-21 NOTE — Telephone Encounter (Signed)
CT abd scheduled for 5/28 at 8:30am, arrival time 8:15am, p/u oral contrast with instructions from [facility] (1st floor). Patient will need BUN/Creatinine prior to CT.  Called patient and she is aware of CT appt details. She will go p/u oral contrast from [facility]. Informed will need labs done. This will be done at [facility]. I called [facility] and spoke with Elmyra Ricks in the lab. She states to fax order to her at [phone]. Patient does not need appt. Will need to go to 1st floor to admissions to register and they will send her to the lab.  Called patient back to inform regarding lab-LMTCB.  PA submitted via [facility] website.

## 2018-12-21 NOTE — Progress Notes (Signed)
See TE encounter 12/21/2018

## 2018-12-21 NOTE — Progress Notes (Signed)
PT is aware and Mindy has already scheduled the CT. Pt is aware to have labs done at least by Wed 12/28/2018 at the Admissions at [facility]. We did not have samples of the Creon and I called the order to [name] at [facility].

## 2018-12-21 NOTE — Telephone Encounter (Signed)
Spoke with [facility] PA Dept. Was advised no PA was required for CT. Ref# [id number]

## 2018-12-21 NOTE — Telephone Encounter (Signed)
Mahala Menghini, PA-C filed at 12/20/2018 8:30 PM   Status: Addendum    See [facility] recommendations. She spoke to patient but need further assistance.   See her order for CT scan Abd with IVC to survey her pancreas.   Offer samples of Creon 36,000 take 2-3 with high fat meals up to 8 per day, we can send in rx  For 240 with 3 refills if she prefers.

## 2018-12-21 NOTE — Telephone Encounter (Signed)
Patient called back and spoke with DS and is aware where to go regarding labs

## 2018-12-27 ENCOUNTER — Other Ambulatory Visit (HOSPITAL_COMMUNITY)
Admission: RE | Admit: 2018-12-27 | Discharge: 2018-12-27 | Disposition: A | Discharge status: Home / Self Care | Payer: 59 | Source: Ambulatory Visit | Attending: Gastroenterology | Admitting: Gastroenterology

## 2018-12-27 DIAGNOSIS — R1010 Upper abdominal pain, unspecified: Secondary | ICD-10-CM | POA: Diagnosis not present

## 2018-12-27 DIAGNOSIS — R197 Diarrhea, unspecified: Secondary | ICD-10-CM | POA: Insufficient documentation

## 2018-12-27 LAB — CREATININE, SERUM
Creatinine, Ser: 1.35 mg/dL — ABNORMAL HIGH (ref 0.44–1.00)
GFR calc Af Amer: 49 mL/min — ABNORMAL LOW (ref >60)
GFR calc non Af Amer: 42 mL/min — ABNORMAL LOW (ref >60)

## 2018-12-27 LAB — BUN: BUN: 18 mg/dL (ref 8–23)

## 2018-12-27 NOTE — Progress Notes (Signed)
PT is aware.

## 2018-12-27 NOTE — Progress Notes (Signed)
Pt os aware.

## 2018-12-28 DIAGNOSIS — Z79899 Other long term (current) drug therapy: Secondary | ICD-10-CM | POA: Diagnosis not present

## 2018-12-29 ENCOUNTER — Encounter (HOSPITAL_COMMUNITY): Payer: Self-pay

## 2018-12-29 ENCOUNTER — Ambulatory Visit (HOSPITAL_COMMUNITY)
Admission: RE | Admit: 2018-12-29 | Discharge: 2018-12-29 | Disposition: A | Discharge status: Home / Self Care | Payer: 59 | Source: Ambulatory Visit | Attending: Gastroenterology | Admitting: Gastroenterology

## 2018-12-29 ENCOUNTER — Other Ambulatory Visit: Payer: Self-pay

## 2018-12-29 DIAGNOSIS — R1010 Upper abdominal pain, unspecified: Secondary | ICD-10-CM

## 2018-12-29 DIAGNOSIS — R109 Unspecified abdominal pain: Secondary | ICD-10-CM | POA: Diagnosis not present

## 2018-12-29 DIAGNOSIS — R197 Diarrhea, unspecified: Secondary | ICD-10-CM | POA: Diagnosis not present

## 2018-12-29 MED ORDER — IOHEXOL 300 MG/ML  SOLN
75.0000 mL | Freq: Once | INTRAMUSCULAR | Status: AC | PRN
  Administered 2018-12-29: 09:00:00 75 mL via INTRAVENOUS

## 2018-12-29 MED ORDER — SODIUM CHLORIDE (PF) 0.9 % IJ SOLN
INTRAMUSCULAR | Status: AC
  Filled 2018-12-29: qty 50

## 2018-12-30 ENCOUNTER — Ambulatory Visit (INDEPENDENT_AMBULATORY_CARE_PROVIDER_SITE_OTHER): Payer: 59 | Admitting: Family Medicine

## 2018-12-30 ENCOUNTER — Encounter: Payer: Self-pay | Admitting: Family Medicine

## 2018-12-30 VITALS — BP 110/74 | HR 82 | Ht 66.0 in

## 2018-12-30 DIAGNOSIS — G8929 Other chronic pain: Secondary | ICD-10-CM

## 2018-12-30 DIAGNOSIS — M545 Low back pain, unspecified: Secondary | ICD-10-CM

## 2018-12-30 DIAGNOSIS — M533 Sacrococcygeal disorders, not elsewhere classified: Secondary | ICD-10-CM | POA: Diagnosis not present

## 2018-12-30 DIAGNOSIS — M999 Biomechanical lesion, unspecified: Secondary | ICD-10-CM

## 2018-12-30 MED ORDER — GABAPENTIN 100 MG PO CAPS: 200.0000 mg | ORAL_CAPSULE | Freq: Every day | ORAL | 3 refills | Status: DC

## 2018-12-30 MED ORDER — VITAMIN D (ERGOCALCIFEROL) 1.25 MG (50000 UNIT) PO CAPS: 50000.0000 [IU] | ORAL_CAPSULE | ORAL | 0 refills | Status: DC

## 2018-12-30 MED ORDER — TIZANIDINE HCL 4 MG PO TABS: 4.0000 mg | ORAL_TABLET | Freq: Every evening | ORAL | 2 refills | Status: DC

## 2018-12-30 MED ORDER — KETOROLAC TROMETHAMINE 60 MG/2ML IM SOLN
60.0000 mg | Freq: Once | INTRAMUSCULAR | Status: AC
  Administered 2018-12-30: 60 mg via INTRAMUSCULAR

## 2018-12-30 MED ORDER — METHYLPREDNISOLONE ACETATE 80 MG/ML IJ SUSP
80.0000 mg | Freq: Once | INTRAMUSCULAR | Status: AC
  Administered 2018-12-30: 80 mg via INTRAMUSCULAR

## 2018-12-30 MED FILL — VIT D2 1.25 MG (50,000 UNIT: 1.25 MG | 84 days supply | Qty: 12 | Fill #0

## 2018-12-30 MED FILL — GABAPENTIN 100 MG CAPSULE: 100 | 30 days supply | Qty: 60 | Fill #0

## 2018-12-30 MED FILL — tiZANidine HCL 4 MG TABS: 4 | 30 days supply | Qty: 30 | Fill #0

## 2018-12-30 NOTE — Patient Instructions (Addendum)
Good to see you  Ice is your friend  2 injections today   Stay active but avoid contact   gabapentin 200mg  at night Zanaflex I a muscle relaxer and can take at night  Once weekly vitamin D for12 weeks See me 1130 on Monday
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
## 2018-12-30 NOTE — Assessment & Plan Note (Signed)
Decision today to treat with OMT was based on Physical Exam  After verbal consent patient was treated with HVLA, ME, FPR techniques in cervical, thoracic, lumbar and sacral areas  Patient tolerated the procedure well with improvement in symptoms  Patient given exercises, stretches and lifestyle modifications  See medications in patient instructions if given  Patient will follow up in 1 weeks

## 2018-12-30 NOTE — Assessment & Plan Note (Signed)
History of fracture previously, no true fracture though noted likely without the traumatic event.  Discussed with patient more likely irritation and potentially stress reaction.  Discussed using over-the-counter medications to keep stool soft for now.  Gabapentin started, vitamin D started, muscle relaxer given.  May need pain medications in the long run.  Following up again in 72 hours
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
## 2019-01-01 NOTE — Progress Notes (Signed)
[name] [address] [place], [place] [address] Phone: [phone] Subjective:   [name], am serving as a scribe for Dr. Hulan Saas.  I'm seeing this patient by the request  of:    CC: Low back pain follow-up  [id number]   12/30/2018: History of fracture previously, [name] true fracture though noted likely without the traumatic event.  Discussed with patient more likely irritation and potentially stress reaction.  Discussed using over-the-counter medications to keep stool soft for now.  Gabapentin started, vitamin D started, muscle relaxer given.  May need pain medications in the long run.  Following up again in 72 hours  Update 01/02/2019: [name] is a 63 y.o. female coming in with complaint of coccyx pain. Felt some improvement on Saturday morning after moving around. Has had to lie down since we last saw her. Does feel somewhat worse.  Patient actually thinks that if anything if anything getting significantly worse.  Patient states it is hard to find a comfortable position.  Patient wants to avoid pain medications though.  Has been avoiding anti-inflammatory secondary to the stomach issue she was having earlier in the month.  Denies any fevers, chills, any abnormal weight loss or weight gain.      Past Medical History:  Diagnosis Date  . Cancer ([place])    skin cancer  . Diverticulosis    CT  . Fibroids 09/07/2013  . PONV (postoperative nausea and vomiting)    non recent   . S/P hysterectomy with oophorectomy 09/07/2013  . Seasonal allergies   . SVD (spontaneous vaginal delivery)    x 1   Past Surgical History:  Procedure Laterality Date  . ABDOMINAL HYSTERECTOMY    . COLONOSCOPY     2 or 3  . COLONOSCOPY N/A 01/04/2017   Dr. Oneida Alar: Significant looping of the rectosigmoid colon, 4 mm hyperplastic polyp removed from rectum, internal hemorrhoids.  Next colonoscopy in 5 to 10 years with MAC and ultra slim colonoscope.  Marland Kitchen DILATION AND  CURETTAGE OF UTERUS     x 3  . EUS N/A 11/16/2013   Dr. Ardis Hughs: parenchyma in tail of pancreas suggests chronic pancreatitis, abrupt changes in main pancreatic duct in tail becomes dilated up to 4-42m, numerous associated dilated side branches, [name] solid mass. bx most c/w IPMN  . EYE SURGERY     lasik bilateral  . LAPAROSCOPY N/A 09/25/2013   Procedure: LAPAROSCOPY OPERATIVE;  Surgeon: VElveria Royals MD;  [facility];  Service: Gynecology;  Laterality: N/A;  . PANCREATECTOMY N/A 12/07/2013   Procedure: LAPAROSCOPIC DISTAL  PANCREATECTOMY;  Surgeon: FStark Klein MD;  [facility]  Service: General;  Laterality: N/A;  . POLYPECTOMY  01/04/2017   Procedure: POLYPECTOMY;  Surgeon: FDanie Binder MD;  [facility];  Service: Endoscopy;;  rectal  . ROBOTIC ASSISTED TOTAL HYSTERECTOMY WITH BILATERAL SALPINGO OOPHERECTOMY Bilateral 09/07/2013   Procedure: ROBOTIC ASSISTED TOTAL HYSTERECTOMY WITH BILATERAL SALPINGO OOPHORECTOMY;  Surgeon: VElveria Royals MD;  [facility];  Service: Gynecology;  Laterality: Bilateral;  . WISDOM TOOTH EXTRACTION     Social History   Socioeconomic History  . Marital status: Married    Spouse name: Not on file  . Number of children: Not on file  . Years of education: Not on file  . 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 Financial resource strain: Not on file  . Food insecurity:    Worry: Not on file    Inability: Not on file  . Transportation needs:    Medical: Not on file    Non-medical: Not on file  Tobacco Use  . Smoking status: Former Smoker    Packs/day: 1.00    Years: 10.00    Pack years: 10.00    Types: Cigarettes    Last attempt to quit: 01/02/1987    Years since quitting: 32.0  . Smokeless tobacco: Never Used  Substance and Sexual Activity  . Alcohol use: Yes    Comment: 2-3 glases of wine on weekend nights   . Drug use: [name]  . Sexual activity: Not Currently    Birth control/protection: Post-menopausal, Surgical  Lifestyle  . Physical activity:    Days per week: Not on file    Minutes per session: Not on file  . Stress: Not on file  Relationships  . Social connections:    Talks on phone: Not on file    Gets together: Not on file    Attends religious service: Not on file    Active member of club or organization: Not on file    Attends meetings of clubs or organizations: Not on file    Relationship status: Not on file  Other Topics Concern  . Not on file  Social History Narrative  . Not on file   [name] Known Allergies Family History  Problem Relation Age of Onset  . Cancer Mother 52       colon,lung   . COPD Mother   . Colon cancer Mother     Current Outpatient Medications (Endocrine & Metabolic):  .  estradiol (VIVELLE-DOT) 0.1 MG/24HR patch, Place 1 patch onto the skin 2 (two) times a week. Wednesdays and Saturdays   Current Outpatient Medications (Respiratory):  .  fexofenadine-pseudoephedrine (ALLEGRA-D) 60-120 MG per tablet, Take 1 tablet by mouth daily. .  fluticasone (FLONASE) 50 MCG/ACT nasal spray, Place 1 spray into both nostrils daily.   Current Outpatient Medications (Analgesics):  .  ibuprofen (ADVIL,MOTRIN) 200 MG tablet, Take 400 mg by mouth daily as needed for headache or moderate pain. .  meloxicam (MOBIC) 15 MG tablet, TAKE 1 TABLET BY MOUTH DAILY.   Current Outpatient Medications (Other):  Marland Kitchen  ALPRAZolam (XANAX) 0.5 MG tablet, Take 0.5 mg by mouth daily as needed for anxiety. Marland Kitchen  ascorbic acid (VITAMIN C) 1000 MG tablet, Take 1,000 mg by mouth daily. Marland Kitchen  b complex vitamins tablet, Take 1 tablet by mouth daily. .  Calcium Carb-Cholecalciferol (CALCIUM 600+D3) 600-800 MG-UNIT TABS, Take 1 tablet by mouth daily. .  Diclofenac Sodium (PENNSAID) 2 % SOLN, Place 2 application onto the skin 2 (two) times daily. (Patient taking differently: Place 2 application onto the  skin daily. ) .  gabapentin (NEURONTIN) 100 MG capsule, Take 2 capsules (200 mg total) by mouth at bedtime. .  Lactobacillus (ACIDOPHILUS PO), Take 1 tablet by mouth daily. .  lipase/protease/amylase (CREON) 36000 UNITS CPEP capsule, 2-3 PO WITH MEALS .  Multiple Vitamin (MULTIVITAMIN) tablet, Take 1 tablet by mouth daily. .  Omega-3 Fatty Acids (FISH OIL PO), Take 1 capsule by mouth daily. Vladimir Faster Glycol-Propyl Glycol (SYSTANE OP), Apply 1 drop to eye 2 (two) times daily as needed (dry eyes). .  RESTASIS 0.05 % ophthalmic emulsion, Place 1 drop into both eyes 2 (two) times daily. .  Vitamin D, Ergocalciferol, (DRISDOL) 1.25 MG (  50000 UT) CAPS capsule, Take 1 capsule (50,000 Units total) by mouth every 7 (seven) days. Marland Kitchen  tiZANidine (ZANAFLEX) 4 MG tablet, Take 1 tablet (4 mg total) by mouth every 6 (six) hours as needed for muscle spasms.    Past medical history, social, surgical and family history all reviewed in electronic medical record.  [name] pertanent information unless stated regarding to the chief complaint.   Review of Systems:  [name] headache, visual changes, nausea, vomiting, diarrhea, constipation, dizziness, abdominal pain, skin rash, fevers, chills, night sweats, weight loss, swollen lymph nodes, body aches, joint swelling,  chest pain, shortness of breath, mood changes.  Severe muscle aches  Objective  Blood pressure 110/66, pulse (!) 52, height _0  (1.676 m), SpO2 99 %.    General: [name] apparent distress alert and oriented x3 mood and affect normal, dressed appropriately.  HEENT: Pupils equal, extraocular movements intact  Respiratory: Patient's speak in full sentences and does not appear short of breath  Cardiovascular: [name] lower extremity edema, non tender, [name] erythema  Skin: Warm dry intact with [name] signs of infection or rash on extremities or on axial skeleton.  Abdomen: Soft nontender  Neuro: Cranial nerves II through XII are intact, neurovascularly intact in all  extremities with \ Lymph: [name] lymphadenopathy of posterior or anterior cervical chain or axillae bilaterally.  Gait severly careful  MSK:  Non tender with full range of motion and good stability and symmetric strength and tone of shoulders, elbows, wrist, hip, knee and ankles bilaterally.  Mild loss of lordosis of the back.  Patient is severely tender to palpation of the left paraspinal musculature even to light palpation.  Positive straight leg test on the right side but not as much on the left.  [name] significant weakness but possibly some decrease in deep tendon reflexes 1+ on the right compared to the left.  Of the Achilles.  Unable to do range of motion exercises. Impression and Recommendations:     This case required medical decision making of moderate complexity. The above documentation has been reviewed and is accurate and complete [name], DO       Note: This dictation was prepared with Dragon dictation along with smaller phrase technology. Any transcriptional errors that result from this process are unintentional.

## 2019-01-02 ENCOUNTER — Other Ambulatory Visit (INDEPENDENT_AMBULATORY_CARE_PROVIDER_SITE_OTHER): Payer: 59

## 2019-01-02 ENCOUNTER — Other Ambulatory Visit: Payer: Self-pay

## 2019-01-02 ENCOUNTER — Ambulatory Visit (INDEPENDENT_AMBULATORY_CARE_PROVIDER_SITE_OTHER): Payer: 59 | Admitting: Family Medicine

## 2019-01-02 ENCOUNTER — Ambulatory Visit (INDEPENDENT_AMBULATORY_CARE_PROVIDER_SITE_OTHER)
Admission: RE | Admit: 2019-01-02 | Discharge: 2019-01-02 | Disposition: A | Discharge status: Home / Self Care | Payer: 59 | Source: Ambulatory Visit | Attending: Family Medicine | Admitting: Family Medicine

## 2019-01-02 ENCOUNTER — Encounter: Payer: Self-pay | Admitting: Family Medicine

## 2019-01-02 VITALS — BP 110/66 | HR 52 | Ht 66.0 in

## 2019-01-02 DIAGNOSIS — M2550 Pain in unspecified joint: Secondary | ICD-10-CM | POA: Diagnosis not present

## 2019-01-02 DIAGNOSIS — M533 Sacrococcygeal disorders, not elsewhere classified: Secondary | ICD-10-CM | POA: Diagnosis not present

## 2019-01-02 DIAGNOSIS — M545 Low back pain, unspecified: Secondary | ICD-10-CM

## 2019-01-02 DIAGNOSIS — M47817 Spondylosis without myelopathy or radiculopathy, lumbosacral region: Secondary | ICD-10-CM | POA: Diagnosis not present

## 2019-01-02 DIAGNOSIS — M5137 Other intervertebral disc degeneration, lumbosacral region: Secondary | ICD-10-CM | POA: Diagnosis not present

## 2019-01-02 DIAGNOSIS — M4317 Spondylolisthesis, lumbosacral region: Secondary | ICD-10-CM | POA: Diagnosis not present

## 2019-01-02 DIAGNOSIS — Z0000 Encounter for general adult medical examination without abnormal findings: Secondary | ICD-10-CM | POA: Diagnosis not present

## 2019-01-02 DIAGNOSIS — M4186 Other forms of scoliosis, lumbar region: Secondary | ICD-10-CM | POA: Diagnosis not present

## 2019-01-02 LAB — COMPREHENSIVE METABOLIC PANEL
ALT: 13 U/L (ref 0–35)
AST: 15 U/L (ref 0–37)
Albumin: 4.4 g/dL (ref 3.5–5.2)
Alkaline Phosphatase: 42 U/L (ref 39–117)
BUN: 18 mg/dL (ref 6–23)
CO2: 27 mEq/L (ref 19–32)
Calcium: 9.3 mg/dL (ref 8.4–10.5)
Chloride: 103 mEq/L (ref 96–112)
Creatinine, Ser: 0.93 mg/dL (ref 0.40–1.20)
GFR: 61.19 mL/min (ref >60.00)
Glucose, Bld: 91 mg/dL (ref 70–99)
Potassium: 4.1 mEq/L (ref 3.5–5.1)
Sodium: 137 mEq/L (ref 135–145)
Total Bilirubin: 0.3 mg/dL (ref 0.2–1.2)
Total Protein: 7.1 g/dL (ref 6.0–8.3)

## 2019-01-02 LAB — SEDIMENTATION RATE: Sed Rate: 9 mm/hr (ref 0–30)

## 2019-01-02 LAB — CBC WITH DIFFERENTIAL/PLATELET
Basophils Absolute: 0.1 10*3/uL (ref 0.0–0.1)
Basophils Relative: 1.3 % (ref 0.0–3.0)
Eosinophils Absolute: 0.2 10*3/uL (ref 0.0–0.7)
Eosinophils Relative: 4.3 % (ref 0.0–5.0)
HCT: 39.6 % (ref 36.0–46.0)
Hemoglobin: 13.7 g/dL (ref 12.0–15.0)
Lymphocytes Relative: 33 % (ref 12.0–46.0)
Lymphs Abs: 1.7 10*3/uL (ref 0.7–4.0)
MCHC: 34.5 g/dL (ref 30.0–36.0)
MCV: 90.2 fl (ref 78.0–100.0)
Monocytes Absolute: 0.4 10*3/uL (ref 0.1–1.0)
Monocytes Relative: 7.6 % (ref 3.0–12.0)
Neutro Abs: 2.7 10*3/uL (ref 1.4–7.7)
Neutrophils Relative %: 53.8 % (ref 43.0–77.0)
Platelets: 230 10*3/uL (ref 150.0–400.0)
RBC: 4.39 Mil/uL (ref 3.87–5.11)
RDW: 12.6 % (ref 11.5–15.5)
WBC: 5 10*3/uL (ref 4.0–10.5)

## 2019-01-02 LAB — URINALYSIS
Bilirubin Urine: NEGATIVE
Hgb urine dipstick: NEGATIVE
Ketones, ur: NEGATIVE
Leukocytes,Ua: NEGATIVE
Nitrite: NEGATIVE
Specific Gravity, Urine: 1.015 (ref 1.000–1.030)
Total Protein, Urine: NEGATIVE
Urine Glucose: NEGATIVE
Urobilinogen, UA: 0.2 (ref 0.0–1.0)
pH: 6 (ref 5.0–8.0)

## 2019-01-02 LAB — URIC ACID: Uric Acid, Serum: 6.3 mg/dL (ref 2.4–7.0)

## 2019-01-02 LAB — VITAMIN D 25 HYDROXY (VIT D DEFICIENCY, FRACTURES): VITD: 45.44 ng/mL (ref 30.00–100.00)

## 2019-01-02 LAB — FERRITIN: Ferritin: 67.2 ng/mL (ref 10.0–291.0)

## 2019-01-02 LAB — IBC PANEL
Iron: 85 ug/dL (ref 42–145)
Saturation Ratios: 27.2 % (ref 20.0–50.0)
Transferrin: 223 mg/dL (ref 212.0–360.0)

## 2019-01-02 LAB — LIPASE: Lipase: 17 U/L (ref 11.0–59.0)

## 2019-01-02 LAB — TSH: TSH: 2.85 u[IU]/mL (ref 0.35–4.50)

## 2019-01-02 MED ORDER — TIZANIDINE HCL 4 MG PO TABS: 4.0000 mg | ORAL_TABLET | Freq: Four times a day (QID) | ORAL | 0 refills | Status: DC | PRN

## 2019-01-02 NOTE — Assessment & Plan Note (Signed)
Pain is worsened at this time.  Patient's pain seems to be going up into the paraspinal musculature or spine more.  Patient has had a history of L4-L5 and has sustained to an epidural greater than 2 years ago.  Patient pain now has seemed to worsen very quickly.  Laboratory work-up and x-rays ordered today to rule out occult fracture as well as any type of infectious etiology like to be contributing.  I do believe the patient's MRI being nearly 63 years old or repeat would be beneficial for diagnostic and therapeutic purposes and made it urgent.  Worsening pain encourage patient to go to the emergency room.  We will see what the imaging shows and follow-up with me again after imaging and testing.

## 2019-01-02 NOTE — Progress Notes (Signed)
ON RECALL FOR 1 YR REPEAT CT

## 2019-01-02 NOTE — Patient Instructions (Signed)
I am so sorry you are  Hurting.  We did injections Duexis 3 times a day for 3 days  Xray and labs downstairs MRi ordered and we will try to make that quickly  We will be in touch

## 2019-01-03 LAB — PTH, INTACT AND CALCIUM
Calcium: 9.4 mg/dL (ref 8.6–10.4)
PTH: 21 pg/mL (ref 14–64)

## 2019-01-03 LAB — CALCIUM, IONIZED: Calcium, Ion: 5.2 mg/dL (ref 4.8–5.6)

## 2019-01-04 ENCOUNTER — Other Ambulatory Visit: Payer: Self-pay

## 2019-01-04 ENCOUNTER — Ambulatory Visit
Admission: RE | Admit: 2019-01-04 | Discharge: 2019-01-04 | Disposition: A | Discharge status: Home / Self Care | Payer: 59 | Source: Ambulatory Visit | Attending: Family Medicine | Admitting: Family Medicine

## 2019-01-04 DIAGNOSIS — M545 Low back pain, unspecified: Secondary | ICD-10-CM

## 2019-01-04 DIAGNOSIS — M5136 Other intervertebral disc degeneration, lumbar region: Secondary | ICD-10-CM | POA: Diagnosis not present

## 2019-01-04 DIAGNOSIS — M5127 Other intervertebral disc displacement, lumbosacral region: Secondary | ICD-10-CM | POA: Diagnosis not present

## 2019-01-04 DIAGNOSIS — F4323 Adjustment disorder with mixed anxiety and depressed mood: Secondary | ICD-10-CM | POA: Diagnosis not present

## 2019-01-06 ENCOUNTER — Other Ambulatory Visit: Payer: Self-pay

## 2019-01-06 DIAGNOSIS — G8929 Other chronic pain: Secondary | ICD-10-CM

## 2019-01-06 DIAGNOSIS — M533 Sacrococcygeal disorders, not elsewhere classified: Secondary | ICD-10-CM

## 2019-01-11 ENCOUNTER — Other Ambulatory Visit: Payer: Self-pay | Admitting: Internal Medicine

## 2019-01-11 ENCOUNTER — Ambulatory Visit: Payer: 59 | Admitting: Family Medicine

## 2019-01-11 DIAGNOSIS — Z1231 Encounter for screening mammogram for malignant neoplasm of breast: Secondary | ICD-10-CM

## 2019-01-13 ENCOUNTER — Other Ambulatory Visit: Payer: Self-pay | Admitting: Family Medicine

## 2019-01-13 MED FILL — MELOXICAM 15 MG TABLET: 15 | 90 days supply | Qty: 90 | Fill #0

## 2019-01-13 MED FILL — ESTRADIOL PATCH 0.0375: 0.0375 | 84 days supply | Qty: 24 | Fill #2

## 2019-01-13 NOTE — Telephone Encounter (Signed)
Refill done.  

## 2019-01-16 ENCOUNTER — Ambulatory Visit
Admission: RE | Admit: 2019-01-16 | Discharge: 2019-01-16 | Disposition: A | Discharge status: Home / Self Care | Payer: 59 | Source: Ambulatory Visit | Attending: Family Medicine | Admitting: Family Medicine

## 2019-01-16 DIAGNOSIS — G8929 Other chronic pain: Secondary | ICD-10-CM

## 2019-01-16 DIAGNOSIS — M545 Low back pain: Secondary | ICD-10-CM | POA: Diagnosis not present

## 2019-01-16 MED ORDER — IOPAMIDOL (ISOVUE-M 200) INJECTION 41%
1.0000 mL | Freq: Once | INTRAMUSCULAR | Status: AC
  Administered 2019-01-16: 1 mL via EPIDURAL

## 2019-01-16 MED ORDER — METHYLPREDNISOLONE ACETATE 40 MG/ML INJ SUSP (RADIOLOG
120.0000 mg | Freq: Once | INTRAMUSCULAR | Status: AC
  Administered 2019-01-16: 120 mg via EPIDURAL

## 2019-01-16 NOTE — Discharge Instructions (Signed)

## 2019-02-01 ENCOUNTER — Ambulatory Visit
Admission: RE | Admit: 2019-02-01 | Discharge: 2019-02-01 | Disposition: A | Discharge status: Home / Self Care | Payer: 59 | Source: Ambulatory Visit | Attending: Internal Medicine | Admitting: Internal Medicine

## 2019-02-01 ENCOUNTER — Other Ambulatory Visit: Payer: Self-pay

## 2019-02-01 DIAGNOSIS — Z1231 Encounter for screening mammogram for malignant neoplasm of breast: Secondary | ICD-10-CM | POA: Diagnosis not present

## 2019-02-01 DIAGNOSIS — F4323 Adjustment disorder with mixed anxiety and depressed mood: Secondary | ICD-10-CM | POA: Diagnosis not present

## 2019-02-14 ENCOUNTER — Encounter: Payer: Self-pay | Admitting: Family Medicine

## 2019-02-14 ENCOUNTER — Ambulatory Visit (INDEPENDENT_AMBULATORY_CARE_PROVIDER_SITE_OTHER): Payer: 59 | Admitting: Family Medicine

## 2019-02-14 ENCOUNTER — Other Ambulatory Visit: Payer: Self-pay

## 2019-02-14 DIAGNOSIS — S838X1A Sprain of other specified parts of right knee, initial encounter: Secondary | ICD-10-CM

## 2019-02-14 NOTE — Progress Notes (Signed)
[name] [address] [place],  [address] Phone: [phone] Subjective:   I [name] am serving as a Education administrator for Dr. Hulan Saas.  I'm seeing this patient by the request  of:    CC: Right knee pain.  [id number]  [name] is a 63 y.o. female coming in with complaint of right knee pain. Right knee injection.  Patient spends most mornings with mild to moderate arthritic changes and what appears to be is nontender.  Patient states that the pain is on and off.  Patient was improving with viscosupplementation which supplied  Today.    Past Medical History:  Diagnosis Date  . Cancer ([place])    skin cancer  . Diverticulosis    CT  . Fibroids 09/07/2013  . PONV (postoperative nausea and vomiting)    non recent   . S/P hysterectomy with oophorectomy 09/07/2013  . Seasonal allergies   . SVD (spontaneous vaginal delivery)    x 1   Past Surgical History:  Procedure Laterality Date  . ABDOMINAL HYSTERECTOMY    . COLONOSCOPY     2 or 3  . COLONOSCOPY N/A 01/04/2017   Dr. Oneida Alar: Significant looping of the rectosigmoid colon, 4 mm hyperplastic polyp removed from rectum, internal hemorrhoids.  Next colonoscopy in 5 to 10 years with MAC and ultra slim colonoscope.  Marland Kitchen DILATION AND CURETTAGE OF UTERUS     x 3  . EUS N/A 11/16/2013   Dr. Ardis Hughs: parenchyma in tail of pancreas suggests chronic pancreatitis, abrupt changes in main pancreatic duct in tail becomes dilated up to 4-43m, numerous associated dilated side branches, no solid mass. bx most c/w IPMN  . EYE SURGERY     lasik bilateral  . LAPAROSCOPY N/A 09/25/2013   Procedure: LAPAROSCOPY OPERATIVE;  Surgeon: VElveria Royals MD;  [facility];  Service: Gynecology;  Laterality: N/A;  . PANCREATECTOMY N/A 12/07/2013   Procedure: LAPAROSCOPIC DISTAL  PANCREATECTOMY;  Surgeon: FStark Klein MD;  [facility]  Service: General;  Laterality: N/A;  . POLYPECTOMY  01/04/2017   Procedure: POLYPECTOMY;  Surgeon: FDanie Binder MD;  [facility];  Service: Endoscopy;;  rectal  . ROBOTIC ASSISTED TOTAL HYSTERECTOMY WITH BILATERAL SALPINGO OOPHERECTOMY Bilateral 09/07/2013   Procedure: ROBOTIC ASSISTED TOTAL HYSTERECTOMY WITH BILATERAL SALPINGO OOPHORECTOMY;  Surgeon: VElveria Royals MD;  [facility];  Service: Gynecology;  Laterality: Bilateral;  . WISDOM TOOTH EXTRACTION     Social History   Socioeconomic History  . Marital status: Married    Spouse name: Not on file  . Number of children: Not on file  . Years of education: Not on file  . Highest education level: Not on file  Occupational History    Employer: [employer] account executive Social Needs  . Financial resource strain: Not on file  . Food insecurity    Worry: Not on file    Inability: Not on file  . Transportation needs    Medical: Not on file    Non-medical: Not on file  Tobacco Use  . Smoking status: Former Smoker    Packs/day: 1.00    Years: 10.00    Pack years: 10.00    Types: Cigarettes    Quit date: 01/02/1987    Years since quitting: 32.1  . Smokeless tobacco: Never Used  Substance and Sexual Activity  . Alcohol use: Yes    Comment: 2-3  glases of wine on weekend nights  . Drug use: No  . Sexual activity: Not Currently    Birth control/protection: Post-menopausal, Surgical  Lifestyle  . Physical activity    Days per week: Not on file    Minutes per session: Not on file  . Stress: Not on file  Relationships  . Social Herbalist on phone: Not on file    Gets together: Not on file    Attends religious service: Not on file    Active member of club or organization: Not on file    Attends meetings of clubs or organizations: Not on file    Relationship status: Not on file  Other Topics Concern  . Not on file  Social History Narrative  . Not on file   No Known Allergies Family History  Problem Relation Age of  Onset  . Cancer Mother 71       colon,lung   . COPD Mother   . Colon cancer Mother     Current Outpatient Medications (Endocrine & Metabolic):  .  estradiol (VIVELLE-DOT) 0.1 MG/24HR patch, Place 1 patch onto the skin 2 (two) times a week. Wednesdays and Saturdays   Current Outpatient Medications (Respiratory):  .  fexofenadine-pseudoephedrine (ALLEGRA-D) 60-120 MG per tablet, Take 1 tablet by mouth daily. .  fluticasone (FLONASE) 50 MCG/ACT nasal spray, Place 1 spray into both nostrils daily.   Current Outpatient Medications (Analgesics):  .  ibuprofen (ADVIL,MOTRIN) 200 MG tablet, Take 400 mg by mouth daily as needed for headache or moderate pain. .  meloxicam (MOBIC) 15 MG tablet, TAKE 1 TABLET BY MOUTH DAILY.   Current Outpatient Medications (Other):  Marland Kitchen  ALPRAZolam (XANAX) 0.5 MG tablet, Take 0.5 mg by mouth daily as needed for anxiety. Marland Kitchen  ascorbic acid (VITAMIN C) 1000 MG tablet, Take 1,000 mg by mouth daily. Marland Kitchen  b complex vitamins tablet, Take 1 tablet by mouth daily. .  Calcium Carb-Cholecalciferol (CALCIUM 600+D3) 600-800 MG-UNIT TABS, Take 1 tablet by mouth daily. .  Diclofenac Sodium (PENNSAID) 2 % SOLN, Place 2 application onto the skin 2 (two) times daily. (Patient taking differently: Place 2 application onto the skin daily. ) .  gabapentin (NEURONTIN) 100 MG capsule, Take 2 capsules (200 mg total) by mouth at bedtime. .  Lactobacillus (ACIDOPHILUS PO), Take 1 tablet by mouth daily. .  lipase/protease/amylase (CREON) 36000 UNITS CPEP capsule, 2-3 PO WITH MEALS .  Multiple Vitamin (MULTIVITAMIN) tablet, Take 1 tablet by mouth daily. .  Omega-3 Fatty Acids (FISH OIL PO), Take 1 capsule by mouth daily. Vladimir Faster Glycol-Propyl Glycol (SYSTANE OP), Apply 1 drop to eye 2 (two) times daily as needed (dry eyes). .  RESTASIS 0.05 % ophthalmic emulsion, Place 1 drop into both eyes 2 (two) times daily. Marland Kitchen  tiZANidine (ZANAFLEX) 4 MG tablet, Take 1 tablet (4 mg total) by mouth every  6 (six) hours as needed for muscle spasms. .  Vitamin D, Ergocalciferol, (DRISDOL) 1.25 MG (50000 UT) CAPS capsule, Take 1 capsule (50,000 Units total) by mouth every 7 (seven) days.    Past medical history, social, surgical and family history all reviewed in electronic medical record.  No pertanent information unless stated regarding to the chief complaint.   Review of Systems:  No headache, visual changes, nausea, vomiting, diarrhea, constipation, dizziness, abdominal pain, skin rash, fevers, chills, night sweats, weight loss, swollen lymph nodes, body aches, joint swelling,, chest pain, shortness of breath, mood changes.  Positive muscle aches  Objective  Blood pressure 132/74, pulse 64, height _0  (1.676 m), SpO2 97 %.   General: No apparent distress alert and oriented x3 mood and affect normal, dressed appropriately.  HEENT: Pupils equal, extraocular movements intact  Respiratory: Patient's speak in full sentences and does not appear short of breath  Cardiovascular: No lower extremity edema, non tender, no erythema  Skin: Warm dry intact with no signs of infection or rash on extremities or on axial skeleton.  Abdomen: Soft nontender  Neuro: Cranial nerves II through XII are intact, neurovascularly intact in all extremities with 2+ DTRs and 2+ pulses.  Lymph: No lymphadenopathy of posterior or anterior cervical chain or axillae bilaterally.  Gait normal with good balance and coordination.  MSK:  Non tender with full range of motion and good stability and symmetric strength and tone of shoulders, elbows, wrist, hip and ankles bilaterally.  Right knee exam shows the patient does have some tenderness to palpation over the lateral joint line.  Positive patellar grind test noted.  Positive McMurray's noted.  Good stability the ligaments.  After informed written and verbal consent, patient was seated on exam table. Right knee was prepped with alcohol swab and utilizing anterolateral  approach, patient's right knee space was injected with 22 mg/mL of Monovisc (sodium hyaluronate) in a prefilled syringe was injected easily into the knee through a 22-gauge needle..Patient tolerated the procedure well without immediate complications.    Impression and Recommendations:     . The above documentation has been reviewed and is accurate and complete [name], DO       Note: This dictation was prepared with Dragon dictation along with smaller phrase technology. Any transcriptional errors that result from this process are unintentional.

## 2019-02-14 NOTE — Assessment & Plan Note (Addendum)
Viscosupplementation given today.  Patient supplied the viscosupplementation.  We discussed icing regimen and home exercise.  Follow-up again in 4 to 6 weeks to make sure patient is improving appropriately.

## 2019-02-20 ENCOUNTER — Other Ambulatory Visit: Payer: Self-pay

## 2019-02-20 DIAGNOSIS — Z20822 Contact with and (suspected) exposure to covid-19: Secondary | ICD-10-CM

## 2019-02-22 DIAGNOSIS — F4323 Adjustment disorder with mixed anxiety and depressed mood: Secondary | ICD-10-CM | POA: Diagnosis not present

## 2019-02-23 LAB — NOVEL CORONAVIRUS, NAA: SARS-CoV-2, NAA: NOT DETECTED

## 2019-02-23 LAB — SPECIMEN STATUS REPORT

## 2019-03-15 DIAGNOSIS — F4323 Adjustment disorder with mixed anxiety and depressed mood: Secondary | ICD-10-CM | POA: Diagnosis not present

## 2019-03-30 MED FILL — ESTRADIOL PATCH 0.0375: 0.0375 | 84 days supply | Qty: 24 | Fill #3

## 2019-04-12 DIAGNOSIS — F4323 Adjustment disorder with mixed anxiety and depressed mood: Secondary | ICD-10-CM | POA: Diagnosis not present

## 2019-04-17 DIAGNOSIS — H25013 Cortical age-related cataract, bilateral: Secondary | ICD-10-CM | POA: Diagnosis not present

## 2019-04-17 DIAGNOSIS — H04123 Dry eye syndrome of bilateral lacrimal glands: Secondary | ICD-10-CM | POA: Diagnosis not present

## 2019-04-17 DIAGNOSIS — H25011 Cortical age-related cataract, right eye: Secondary | ICD-10-CM | POA: Diagnosis not present

## 2019-04-17 DIAGNOSIS — H2511 Age-related nuclear cataract, right eye: Secondary | ICD-10-CM | POA: Diagnosis not present

## 2019-04-17 DIAGNOSIS — H2513 Age-related nuclear cataract, bilateral: Secondary | ICD-10-CM | POA: Diagnosis not present

## 2019-04-17 MED FILL — PREDNISOLONE AC 1% EYE DROP: 1 | 21 days supply | Qty: 5 | Fill #0

## 2019-04-17 MED FILL — OFLOXACIN 0.3% EYE DROPS: 0.3 | 21 days supply | Qty: 5 | Fill #0

## 2019-04-23 DIAGNOSIS — F4323 Adjustment disorder with mixed anxiety and depressed mood: Secondary | ICD-10-CM | POA: Diagnosis not present

## 2019-04-28 DIAGNOSIS — E7849 Other hyperlipidemia: Secondary | ICD-10-CM | POA: Diagnosis not present

## 2019-04-28 DIAGNOSIS — Z419 Encounter for procedure for purposes other than remedying health state, unspecified: Secondary | ICD-10-CM | POA: Diagnosis not present

## 2019-04-28 DIAGNOSIS — Z0000 Encounter for general adult medical examination without abnormal findings: Secondary | ICD-10-CM | POA: Diagnosis not present

## 2019-05-04 DIAGNOSIS — Z1331 Encounter for screening for depression: Secondary | ICD-10-CM | POA: Diagnosis not present

## 2019-05-04 DIAGNOSIS — Z0000 Encounter for general adult medical examination without abnormal findings: Secondary | ICD-10-CM | POA: Diagnosis not present

## 2019-05-04 DIAGNOSIS — M1990 Unspecified osteoarthritis, unspecified site: Secondary | ICD-10-CM | POA: Diagnosis not present

## 2019-05-04 DIAGNOSIS — Z800 Family history of malignant neoplasm of digestive organs: Secondary | ICD-10-CM | POA: Diagnosis not present

## 2019-05-04 DIAGNOSIS — K5790 Diverticulosis of intestine, part unspecified, without perforation or abscess without bleeding: Secondary | ICD-10-CM | POA: Diagnosis not present

## 2019-05-04 DIAGNOSIS — M5380 Other specified dorsopathies, site unspecified: Secondary | ICD-10-CM | POA: Diagnosis not present

## 2019-05-04 DIAGNOSIS — D490 Neoplasm of unspecified behavior of digestive system: Secondary | ICD-10-CM | POA: Diagnosis not present

## 2019-05-04 DIAGNOSIS — J309 Allergic rhinitis, unspecified: Secondary | ICD-10-CM | POA: Diagnosis not present

## 2019-05-04 DIAGNOSIS — E785 Hyperlipidemia, unspecified: Secondary | ICD-10-CM | POA: Diagnosis not present

## 2019-05-04 DIAGNOSIS — G4700 Insomnia, unspecified: Secondary | ICD-10-CM | POA: Diagnosis not present

## 2019-05-17 DIAGNOSIS — H25011 Cortical age-related cataract, right eye: Secondary | ICD-10-CM | POA: Diagnosis not present

## 2019-05-17 DIAGNOSIS — H25812 Combined forms of age-related cataract, left eye: Secondary | ICD-10-CM | POA: Diagnosis not present

## 2019-05-17 DIAGNOSIS — H2511 Age-related nuclear cataract, right eye: Secondary | ICD-10-CM | POA: Diagnosis not present

## 2019-05-23 DIAGNOSIS — F4323 Adjustment disorder with mixed anxiety and depressed mood: Secondary | ICD-10-CM | POA: Diagnosis not present

## 2019-05-24 DIAGNOSIS — H25012 Cortical age-related cataract, left eye: Secondary | ICD-10-CM | POA: Diagnosis not present

## 2019-05-24 DIAGNOSIS — H2512 Age-related nuclear cataract, left eye: Secondary | ICD-10-CM | POA: Diagnosis not present

## 2019-05-29 ENCOUNTER — Other Ambulatory Visit: Payer: Self-pay

## 2019-05-29 ENCOUNTER — Encounter: Payer: Self-pay | Admitting: Family Medicine

## 2019-05-29 MED ORDER — DICLOFENAC SODIUM 2 % TD SOLN: 2.0000 g | Freq: Two times a day (BID) | TRANSDERMAL | 3 refills | Status: DC

## 2019-07-06 ENCOUNTER — Other Ambulatory Visit: Payer: Self-pay

## 2019-07-06 ENCOUNTER — Ambulatory Visit: Payer: 59 | Admitting: Pediatrics

## 2019-07-06 VITALS — BP 130/72 | Ht 65.0 in | Wt 153.0 lb

## 2019-07-06 DIAGNOSIS — R269 Unspecified abnormalities of gait and mobility: Secondary | ICD-10-CM

## 2019-07-06 MED FILL — ESTRADIOL PATCH 0.0375: 0.0375 | 84 days supply | Qty: 24 | Fill #4

## 2019-07-06 NOTE — Progress Notes (Signed)
  [name] - 63 y.o. female MRN [id number]  Date of birth: [birth year]  SUBJECTIVE:   CC: orthotics    63 yo female presenting for new orthotics. She had a pair made in 2011. Requesting new pair today.  ROS: No unexpected weight loss, fever, chills, swelling, instability, muscle pain, numbness/tingling, redness, otherwise see HPI   PMHx - Updated and reviewed.  Contributory factors include: Negative PSHx - Updated and reviewed.  Contributory factors include:  Negative FHx - Updated and reviewed.  Contributory factors include:  Negative Social Hx - Updated and reviewed. Contributory factors include: Negative Medications - reviewed   DATA REVIEWED: Prior records  PHYSICAL EXAM:  VS: BP:130/72  HR: bpm  TEMP: ( )  RESP:   HT:5\' 5"  (165.1 cm)   WT:153 lb (69.4 kg)  BMI:25.46 PHYSICAL EXAM: Gen: NAD, alert, cooperative with exam, well-appearing HEENT: clear conjunctiva,  CV:  no edema, capillary refill brisk, normal rate Resp: non-labored Skin: no rashes, normal turgor  Neuro: no gross deficits.  Psych:  alert and oriented  Feet: Inspection: Small arch at rest, collapse of longitudinal arch with weight bearing. Mild valgus deviation of 1st toe on right Palpation: No tenderness to palpation ROM: Full  ROM of the ankle. Normal midfoot flexibility Strength: 5/5 strength ankle in all planes Neurovascular: N/V intact distally in the lower extremity  Pronated gait, neutralized in orthotic  ASSESSMENT & PLAN:  Patient was fitted for a : standard, cushioned, semi-rigid orthotic. The orthotic was heated and afterward the patient stood on the orthotic blank positioned on the orthotic stand. The patient was positioned in subtalar neutral position and 10 degrees of ankle dorsiflexion in a weight bearing stance. After completion of molding, a stable base was applied to the orthotic blank. The blank was ground to a stable position for weight bearing. Size: 6 Base: blue EVA  Posting: right foot first ray post Additional orthotic padding: medial heel wedge with poron in right foot, bilateral metatarsal pads   Gait was neutral with orthotics in place. Patient found them to be comfortable. Follow-up as needed. Additionally, provided new metatarsal pads, 1st ray post, and medial heel wedge to her old orthotics.  I was the preceptor for this visit and available for immediate consultation Shellia Cleverly, DO

## 2019-07-11 DIAGNOSIS — F4323 Adjustment disorder with mixed anxiety and depressed mood: Secondary | ICD-10-CM | POA: Diagnosis not present

## 2019-07-17 DIAGNOSIS — Z6825 Body mass index (BMI) 25.0-25.9, adult: Secondary | ICD-10-CM | POA: Diagnosis not present

## 2019-07-17 DIAGNOSIS — Z01419 Encounter for gynecological examination (general) (routine) without abnormal findings: Secondary | ICD-10-CM | POA: Diagnosis not present

## 2019-07-17 MED FILL — RESTASIS 0.05% EYE EMULSION: 0.05 | 30 days supply | Qty: 60 | Fill #0

## 2019-07-18 DIAGNOSIS — F4323 Adjustment disorder with mixed anxiety and depressed mood: Secondary | ICD-10-CM | POA: Diagnosis not present

## 2019-08-08 DIAGNOSIS — F4323 Adjustment disorder with mixed anxiety and depressed mood: Secondary | ICD-10-CM | POA: Diagnosis not present

## 2019-08-15 MED FILL — LOTEPREDNOL ETABONATE 0.5 %: 0.5 | 18 days supply | Qty: 5 | Fill #0

## 2019-08-23 ENCOUNTER — Ambulatory Visit (HOSPITAL_COMMUNITY)
Admission: RE | Admit: 2019-08-23 | Discharge: 2019-08-23 | Disposition: A | Discharge status: Home / Self Care | Payer: 59 | Source: Ambulatory Visit | Attending: Gastroenterology | Admitting: Gastroenterology

## 2019-08-23 ENCOUNTER — Ambulatory Visit: Payer: 59 | Admitting: Gastroenterology

## 2019-08-23 ENCOUNTER — Encounter: Payer: Self-pay | Admitting: Gastroenterology

## 2019-08-23 ENCOUNTER — Other Ambulatory Visit: Payer: Self-pay

## 2019-08-23 ENCOUNTER — Telehealth: Payer: Self-pay

## 2019-08-23 DIAGNOSIS — K5730 Diverticulosis of large intestine without perforation or abscess without bleeding: Secondary | ICD-10-CM | POA: Diagnosis not present

## 2019-08-23 DIAGNOSIS — R1032 Left lower quadrant pain: Secondary | ICD-10-CM | POA: Insufficient documentation

## 2019-08-23 LAB — POCT I-STAT CREATININE: Creatinine, Ser: 0.7 mg/dL (ref 0.44–1.00)

## 2019-08-23 LAB — CREATININE, SERUM: Creat: 0.83 mg/dL (ref 0.50–0.99)

## 2019-08-23 MED ORDER — IOHEXOL 300 MG/ML  SOLN
100.0000 mL | Freq: Once | INTRAMUSCULAR | Status: AC | PRN
  Administered 2019-08-23: 100 mL via INTRAVENOUS

## 2019-08-23 MED ORDER — IOHEXOL 300 MG/ML  SOLN
30.0000 mL | Freq: Once | INTRAMUSCULAR | Status: AC | PRN
  Administered 2019-08-23: 30 mL via ORAL

## 2019-08-23 MED ORDER — AMOXICILLIN-POT CLAVULANATE 500-125 MG PO TABS: ORAL_TABLET | ORAL | 0 refills | Status: DC

## 2019-08-23 MED FILL — AMOX-CLAV 500-125 MG TABLET: 500-125 | 10 days supply | Qty: 20 | Fill #0

## 2019-08-23 NOTE — Assessment & Plan Note (Signed)
ACUTE ONSET OF SYMPTOMS AND LIKELY DUE TO ACUTE DIVERTICULITIS.  DRINK WATER TO KEEP YOUR URINE LIGHT YELLOW.  PICK UP CONTRAST. GET LABS DRAWN.  COMPLETE CT SCAN.  START AUGMENTIN TWICE DAILY FOR 10 DAYS.  IT MAY CAUSE ABDOMINAL PAIN, NAUSEA, VOMITING, RASH, OR EXPLOSIVE DIARRHEA.   PLEASE CALL IN 3 DAYS IF YOUR SYMPTOMS ARE NOT IMPROVED. IF NOT CONSIDER FLEX SIG.  TO REDUCE PAIN AND AS ALTERNATIVE TONSAIDs, CONSIDER ALPHA LIPOIC ACID 250 MG TWICE DAILY.  USE TYLENOL IF NEEDED FOR PAIN RELIEF.   TO REDUCE RISK OF CANCER AND IMPROVE OVER ALL WELL NESS, I RECOMMEND YOU READ AND FOLLOW RECOMMENDATIONS BY DR. MARK HYMAN. HE HAS SEVERAL BOOKS TO INCLUDE, "10-DAY DETOX DIET" AND "WHAT THE HECK SHOULD I AT"..   FOLLOW UP IN THE OFFICE WILL BE SCHEDULED IF NEEDED AFTER YOUR CT SCAN.  IF YOU HAVE DIVERTICULITIS, YOUR OPTIONS FOR LONG TERM MANAGEMENT INCLUDE: 1. SIGMOID COLECTOMY AND CONTINUE NSAIDs, OR 2. REDUCE NSAIDS AND TAKE A WAIT AND SEE APPROACH.

## 2019-08-23 NOTE — Telephone Encounter (Signed)
[facility], spoke to [facility]. No PA needed for CT abd/pelvis w/contrast. Ref# [id number].
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
## 2019-08-23 NOTE — Patient Instructions (Addendum)
DRINK WATER TO KEEP YOUR URINE LIGHT YELLOW.  PICK UP CONTRAST. GET LABS DRAWN.  COMPLETE CT SCAN.  START AUGMENTIN TWICE DAILY FOR 10 DAYS.  IT MAY CAUSE ABDOMINAL PAIN, NAUSEA, VOMITING, RASH, OR EXPLOSIVE DIARRHEA.   PLEASE CALL IN 3 DAYS IF YOUR SYMPTOMS ARE NOT IMPROVED.   TO REDUCE PAIN AND AS ALTERNATIVE  TO NSAIDS, CONSIDER ALPHA LIPOIC ACID 250 MG TWICE DAILY.  USE TYLENOL IF NEEDED FOR PAIN RELIEF.   TO REDUCE RISK OF CANCER AND IMPROVE OVER ALL WELL NESS, I RECOMMEND YOU READ AND FOLLOW RECOMMENDATIONS BY DR. MARK HYMAN. HE HAS SEVERAL BOOKS TO INCLUDE, "10-DAY DETOX DIET" AND "WHAT THE HECK SHOULD I AT"..   FOLLOW UP IN THE OFFICE WILL BE SCHEDULED IF NEEDED AFTER YOUR CT SCAN.  IF YOU HAVE DIVERTICULITIS, YOUR OPTIONS FOR LONG TERM MANAGEMENT INCLUDE: 1. SIGMOID COLECTOMY AND CONTINUE NSAIDs, OR 2. REDUCE NSAIDS AND TAKE A WAIT AND SEE APPROACH.

## 2019-08-24 DIAGNOSIS — E7849 Other hyperlipidemia: Secondary | ICD-10-CM | POA: Diagnosis not present

## 2019-08-31 DIAGNOSIS — R82998 Other abnormal findings in urine: Secondary | ICD-10-CM | POA: Diagnosis not present

## 2019-08-31 DIAGNOSIS — E7849 Other hyperlipidemia: Secondary | ICD-10-CM | POA: Diagnosis not present

## 2019-09-06 ENCOUNTER — Other Ambulatory Visit (HOSPITAL_COMMUNITY): Payer: Self-pay | Admitting: Internal Medicine

## 2019-09-06 DIAGNOSIS — M1990 Unspecified osteoarthritis, unspecified site: Secondary | ICD-10-CM | POA: Diagnosis not present

## 2019-09-06 DIAGNOSIS — E785 Hyperlipidemia, unspecified: Secondary | ICD-10-CM | POA: Diagnosis not present

## 2019-09-06 DIAGNOSIS — D490 Neoplasm of unspecified behavior of digestive system: Secondary | ICD-10-CM | POA: Diagnosis not present

## 2019-09-06 DIAGNOSIS — J309 Allergic rhinitis, unspecified: Secondary | ICD-10-CM | POA: Diagnosis not present

## 2019-09-06 DIAGNOSIS — K5790 Diverticulosis of intestine, part unspecified, without perforation or abscess without bleeding: Secondary | ICD-10-CM | POA: Diagnosis not present

## 2019-09-06 DIAGNOSIS — N393 Stress incontinence (female) (male): Secondary | ICD-10-CM | POA: Diagnosis not present

## 2019-09-06 MED FILL — MONTELUKAST SOD 10 MG TAB: 10 | 90 days supply | Qty: 90 | Fill #0

## 2019-09-08 LAB — IFOBT (OCCULT BLOOD): IFOBT: POSITIVE

## 2019-09-11 ENCOUNTER — Telehealth: Payer: Self-pay

## 2019-09-11 NOTE — Telephone Encounter (Signed)
Received fax from [facility] that pt had a HEMOSURE and it was positive.   Placed on Dr. Oneida Alar chair for review when she returns.

## 2019-09-12 ENCOUNTER — Other Ambulatory Visit: Payer: Self-pay

## 2019-09-12 MED ORDER — CLENPIQ 10-3.5-12 MG-GM -GM/160ML PO SOLN: 1.0000 | Freq: Once | ORAL | 0 refills | Status: AC

## 2019-09-12 MED FILL — CLENPIQ 10-3.5-12 MG-GM -GM: 10-3.5-12 M | 1 days supply | Qty: 320 | Fill #0

## 2019-09-12 NOTE — Telephone Encounter (Signed)
Pre-op and COVID test 12/01/19. Letter mailed with procedure instructions. 

## 2019-09-12 NOTE — Telephone Encounter (Signed)
PLEASE CALL PT. SHE HAD AGE APPROPRIATE COLON CANCER SCREENING IN JUN 2018. IN THE FUTURE SHE DOES NOT NEED INTERVAL STOOL TESTING. IT WILL CAUSE HER TO HAVE MORE COLONOSCOPIES THAN SHE NEEDS.  HOWEVER SINCE HER STOOL TEST IS POSITIVE SHE SHOULD HAVE A COLONOSCOPY W/ PROPOFOL BECAUSE 10-20% OF THE TIME WE CAN MISS POLYPS LESS THAN 1 CM. THE BLOOD DETECTED IN HER STOOL IS MOST LIKELY DUE TO HEMORRHOIDS.

## 2019-09-12 NOTE — Telephone Encounter (Signed)
Pt is aware and wants to schedule the colonoscopy and would like to have it as soon as possible. Forwarding to [facility] to schedule.

## 2019-09-12 NOTE — Telephone Encounter (Signed)
PA for TCS submitted via [facility] website. Case ID# [id number].

## 2019-09-12 NOTE — Telephone Encounter (Signed)
Tried to call pt, no answer, LMOVM for return call.  

## 2019-09-12 NOTE — Telephone Encounter (Signed)
Pt called office, TCS w/Prop w/[facility] scheduled for 12/05/19 at 7:30am. Rx for prep sent to pharmacy. Orders entered.

## 2019-09-13 ENCOUNTER — Other Ambulatory Visit: Payer: Self-pay

## 2019-09-13 NOTE — Telephone Encounter (Signed)
Received email from [facility]. PA is not required for TCS.

## 2019-10-03 MED FILL — ESTRADIOL PATCH 0.0375: 0.0375 | 84 days supply | Qty: 24 | Fill #0

## 2019-10-03 MED FILL — FLUTICASONE PROP 50 MCG SPR: 50 | 90 days supply | Qty: 48 | Fill #0

## 2019-10-09 ENCOUNTER — Ambulatory Visit (INDEPENDENT_AMBULATORY_CARE_PROVIDER_SITE_OTHER): Payer: 59

## 2019-10-09 ENCOUNTER — Encounter: Payer: Self-pay | Admitting: Family Medicine

## 2019-10-09 ENCOUNTER — Ambulatory Visit: Payer: 59 | Admitting: Family Medicine

## 2019-10-09 ENCOUNTER — Other Ambulatory Visit: Payer: Self-pay

## 2019-10-09 VITALS — BP 120/70 | HR 70 | Ht 65.0 in | Wt 150.0 lb

## 2019-10-09 DIAGNOSIS — M79671 Pain in right foot: Secondary | ICD-10-CM

## 2019-10-09 DIAGNOSIS — R269 Unspecified abnormalities of gait and mobility: Secondary | ICD-10-CM | POA: Diagnosis not present

## 2019-10-09 DIAGNOSIS — G8929 Other chronic pain: Secondary | ICD-10-CM

## 2019-10-09 DIAGNOSIS — M999 Biomechanical lesion, unspecified: Secondary | ICD-10-CM | POA: Diagnosis not present

## 2019-10-09 DIAGNOSIS — M545 Low back pain, unspecified: Secondary | ICD-10-CM

## 2019-10-09 MED ORDER — VENLAFAXINE HCL ER 37.5 MG PO CP24: 37.5000 mg | ORAL_CAPSULE | Freq: Every day | ORAL | 0 refills | Status: DC

## 2019-10-09 MED ORDER — MELOXICAM 7.5 MG PO TABS: 7.5000 mg | ORAL_TABLET | Freq: Every day | ORAL | 0 refills | Status: DC

## 2019-10-09 MED FILL — VENLAFAXINE HCL ER 37.5 MG: 37.5 | 30 days supply | Qty: 30 | Fill #0

## 2019-10-09 MED FILL — MELOXICAM 7.5 MG TABLET: 7.5 | 30 days supply | Qty: 30 | Fill #0

## 2019-10-09 NOTE — Assessment & Plan Note (Signed)
Patient's new orthotics seem to cause patient to have significant lateral displacement noted that I think is contributing to more of the heel pain.  We discussed other over-the-counter orthotics and potentially having these adjusted.  Will attempt to send notes to the physician who did not make these.  In addition to this we discussed proper shoes.  We can consider other orthotics if beneficial.  Home exercises we have discussed before.  Follow-up again in 4 to 8 weeks.

## 2019-10-09 NOTE — Progress Notes (Signed)
[facility] [address] [place] Phone: [phone] Subjective:   I [name] am serving as a Education administrator for Dr. Hulan Saas.  This visit occurred during the SARS-CoV-2 public health emergency.  Safety protocols were in place, including screening questions prior to the visit, additional usage of staff PPE, and extensive cleaning of exam room while observing appropriate contact time as indicated for disinfecting solutions.   I'm seeing this patient by the request  of:  [name], [name], MD  CC: Heel pain, back pain  [id number]  [name] is a 64 y.o. female coming in with complaint of right heel pain. Last seen on 02/14/2019 for a lateral meniscus tear of the right knee. Patient states she has had pain the past 2 weeks. Pain with walking. Sore pain. Hasn't tried any medications or modalities for the pain. Recently had her insoles redone and believes that could be the cause of her pain.     Patient is also has long-term back pain.  Has been doing relatively well but has had another bout of diverticulitis recently.  Trying to workout regularly but does have some tightness in the back.  Has responded well to osteopathic manipulation previously.  No radiation of pain.  Past Medical History:  Diagnosis Date  . Cancer ([place])    skin cancer  . Diverticulosis    CT  . Fibroids 09/07/2013  . PONV (postoperative nausea and vomiting)    non recent   . S/P hysterectomy with oophorectomy 09/07/2013  . Seasonal allergies   . SVD (spontaneous vaginal delivery)    x 1   Past Surgical History:  Procedure Laterality Date  . ABDOMINAL HYSTERECTOMY    . COLONOSCOPY     2 or 3  . COLONOSCOPY N/A 01/04/2017   Dr. Oneida Alar: Significant looping of the rectosigmoid colon, 4 mm hyperplastic polyp removed from rectum, internal hemorrhoids.  Next colonoscopy in 5 to 10 years with MAC and ultra slim colonoscope.  Marland Kitchen DILATION AND CURETTAGE OF UTERUS     x 3  . EUS N/A 11/16/2013   Dr. Ardis Hughs: parenchyma in tail of pancreas suggests chronic pancreatitis, abrupt changes in main pancreatic duct in tail becomes dilated up to 4-69m, numerous associated dilated side branches, no solid mass. bx most c/w IPMN  . EYE SURGERY     lasik bilateral  . LAPAROSCOPY N/A 09/25/2013   Procedure: LAPAROSCOPY OPERATIVE;  Surgeon: VElveria Royals MD;  [facility];  Service: Gynecology;  Laterality: N/A;  . PANCREATECTOMY N/A 12/07/2013   Procedure: LAPAROSCOPIC DISTAL  PANCREATECTOMY;  Surgeon: FStark Klein MD;  [facility]  Service: General;  Laterality: N/A;  . POLYPECTOMY  01/04/2017   Procedure: POLYPECTOMY;  Surgeon: FDanie Binder MD;  [facility];  Service: Endoscopy;;  rectal  . ROBOTIC ASSISTED TOTAL HYSTERECTOMY WITH BILATERAL SALPINGO OOPHERECTOMY Bilateral 09/07/2013   Procedure: ROBOTIC ASSISTED TOTAL HYSTERECTOMY WITH BILATERAL SALPINGO OOPHORECTOMY;  Surgeon: VElveria Royals MD;  [facility];  Service: Gynecology;  Laterality: Bilateral;  . WISDOM TOOTH EXTRACTION     Social History   Socioeconomic History  . Marital status: Married    Spouse name: Not on file  . Number of children: Not on file  . Years of education: Not on file  . 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  Smoking status: Former Smoker    Packs/day: 1.00    Years: 10.00    Pack years: 10.00    Types: Cigarettes    Quit date: 01/02/1987    Years since quitting: 32.7  . Smokeless tobacco: Never Used  Substance and Sexual Activity  . Alcohol use: Yes    Comment: 2-3 glases of wine on weekend nights  . Drug use: No  . Sexual activity: Not Currently    Birth control/protection: Post-menopausal, Surgical  Other Topics Concern  . Not on file  Social History Narrative  . Not on file   Social Determinants of Health   Financial  Resource Strain:   . Difficulty of Paying Living Expenses: Not on file  Food Insecurity:   . Worried About Charity fundraiser in the Last Year: Not on file  . Ran Out of Food in the Last Year: Not on file  Transportation Needs:   . Lack of Transportation (Medical): Not on file  . Lack of Transportation (Non-Medical): Not on file  Physical Activity:   . Days of Exercise per Week: Not on file  . Minutes of Exercise per Session: Not on file  Stress:   . Feeling of Stress : Not on file  Social Connections:   . Frequency of Communication with Friends and Family: Not on file  . Frequency of Social Gatherings with Friends and Family: Not on file  . Attends Religious Services: Not on file  . Active Member of Clubs or Organizations: Not on file  . Attends Archivist Meetings: Not on file  . Marital Status: Not on file   No Known Allergies Family History  Problem Relation Age of Onset  . Cancer Mother 37       colon,lung   . COPD Mother   . Colon cancer Mother     Current Outpatient Medications (Endocrine & Metabolic):  .  estradiol (VIVELLE-DOT) 0.1 MG/24HR patch, Place 1 patch onto the skin 2 (two) times a week. Wednesdays and Saturdays   Current Outpatient Medications (Respiratory):  .  fexofenadine-pseudoephedrine (ALLEGRA-D) 60-120 MG per tablet, Take 1 tablet by mouth daily. .  fluticasone (FLONASE) 50 MCG/ACT nasal spray, Place 1 spray into both nostrils daily.   Current Outpatient Medications (Analgesics):  .  ibuprofen (ADVIL,MOTRIN) 200 MG tablet, Take 400 mg by mouth daily as needed for headache or moderate pain. .  meloxicam (MOBIC) 15 MG tablet, TAKE 1 TABLET BY MOUTH DAILY. (Patient taking differently: 3 times a week) .  meloxicam (MOBIC) 7.5 MG tablet, Take 1 tablet (7.5 mg total) by mouth daily.   Current Outpatient Medications (Other):  Marland Kitchen  ALPRAZolam (XANAX) 0.5 MG tablet, Take 0.5 mg by mouth daily as needed for anxiety. Marland Kitchen  amoxicillin-clavulanate  (AUGMENTIN) 500-125 MG tablet, 1 PO BID FOR 10 DAYS .  ascorbic acid (VITAMIN C) 1000 MG tablet, Take 1,000 mg by mouth daily. Marland Kitchen  b complex vitamins tablet, Take 1 tablet by mouth daily. .  Calcium Carb-Cholecalciferol (CALCIUM 600+D3) 600-800 MG-UNIT TABS, Take 1 tablet by mouth daily. .  Diclofenac Sodium (PENNSAID) 2 % SOLN, Place 2 application onto the skin 2 (two) times daily. (Patient taking differently: Place 2 application onto the skin daily. ) .  Diclofenac Sodium 2 % SOLN, Place 2 g onto the skin 2 (two) times daily. .  Lactobacillus (ACIDOPHILUS PO), Take 1 tablet by mouth daily. .  Multiple Vitamin (MULTIVITAMIN) tablet, Take 1 tablet by mouth daily. .  Omega-3 Fatty Acids (  FISH OIL PO), Take 1 capsule by mouth daily. Vladimir Faster Glycol-Propyl Glycol (SYSTANE OP), Apply 1 drop to eye 2 (two) times daily as needed (dry eyes). .  RESTASIS 0.05 % ophthalmic emulsion, Place 1 drop into both eyes 2 (two) times daily. Marland Kitchen  venlafaxine XR (EFFEXOR XR) 37.5 MG 24 hr capsule, Take 1 capsule (37.5 mg total) by mouth daily with breakfast.   Reviewed prior external information including notes and imaging from  primary care provider As well as notes that were available from care everywhere and other healthcare systems.  Past medical history, social, surgical and family history all reviewed in electronic medical record.  No pertanent information unless stated regarding to the chief complaint.   Review of Systems:  No headache, visual changes, nausea, vomiting, diarrhea, constipation, dizziness, abdominal pain, skin rash, fevers, chills, night sweats, weight loss, swollen lymph nodes, body aches, joint swelling, chest pain, shortness of breath, mood changes. POSITIVE muscle aches  Objective  Blood pressure 120/70, pulse 70, height _0  (1.651 m), weight 150 lb (68 kg), SpO2 98 %.   General: No apparent distress alert and oriented x3 mood and affect normal, dressed appropriately.  HEENT:  Pupils equal, extraocular movements intact  Respiratory: Patient's speak in full sentences and does not appear short of breath  Cardiovascular: No lower extremity edema, non tender, no erythema  Skin: Warm dry intact with no signs of infection or rash on extremities or on axial skeleton.  Abdomen: Soft nontender  Neuro: Cranial nerves II through XII are intact, neurovascularly intact in all extremities with 2+ DTRs and 2+ pulses.  Lymph: No lymphadenopathy of posterior or anterior cervical chain or axillae bilaterally.  Gait mild antalgic MSK:   Foot exam shows the patient does have loss of the transverse arch noted bilaterally and patient does have oversupination of the right foot and with overpronation of the left foot.  Patient does have rigid midfoot bilaterally.  Back exam does have loss of lordosis.  Tender to palpation of paraspinal musculature lumbar spine radicular left.  Negative straight leg test.  Patient does have limited range of motion of 5 degrees and sidebending and 10 degrees of extension of the back.  Osteopathic findings C3 flexed rotated and side bent left C6 flexed rotated and side bent left T3 extended rotated and side bent right inhaled third rib T9 extended rotated and side bent left L2 flexed rotated and side bent right Sacrum right on right     Impression and Recommendations:     This case required medical decision making of moderate complexity. The above documentation has been reviewed and is accurate and complete [name], DO       Note: This dictation was prepared with Dragon dictation along with smaller phrase technology. Any transcriptional errors that result from this process are unintentional.

## 2019-10-09 NOTE — Patient Instructions (Addendum)
Good to see you Go back and get orthotics adjusted We will find something else soon Effexor 37.5  Take meloxicam for 10 days  See me again in 4- 6 weeks

## 2019-10-09 NOTE — Assessment & Plan Note (Signed)
Decision today to treat with OMT was based on Physical Exam  After verbal consent patient was treated with HVLA, ME, FPR techniques in cervical, thoracic, rib,  lumbar and sacral areas  Patient tolerated the procedure well with improvement in symptoms  Patient given exercises, stretches and lifestyle modifications  See medications in patient instructions if given  Patient will follow up in 4-8 weeks 

## 2019-10-09 NOTE — Assessment & Plan Note (Signed)
Chronic in nature, some of it seems to have more radicular symptoms as well.  We discussed Effexor which patient was prescribed today and warned of potential side effects but I do think patient will do well with it.  Discussed home exercise and icing regimen.  Follow-up again in 4 to 6 weeks

## 2019-10-11 ENCOUNTER — Encounter: Payer: Self-pay | Admitting: Family Medicine

## 2019-10-11 DIAGNOSIS — F4323 Adjustment disorder with mixed anxiety and depressed mood: Secondary | ICD-10-CM | POA: Diagnosis not present

## 2019-10-13 DIAGNOSIS — Z961 Presence of intraocular lens: Secondary | ICD-10-CM | POA: Diagnosis not present

## 2019-10-25 ENCOUNTER — Encounter: Payer: Self-pay | Admitting: Family Medicine

## 2019-10-25 DIAGNOSIS — F4323 Adjustment disorder with mixed anxiety and depressed mood: Secondary | ICD-10-CM | POA: Diagnosis not present

## 2019-10-30 MED FILL — RESTASIS 0.05% EYE EMULSION: 0.05 | 30 days supply | Qty: 60 | Fill #1

## 2019-11-08 ENCOUNTER — Other Ambulatory Visit: Payer: Self-pay | Admitting: Family Medicine

## 2019-11-08 MED FILL — VENLAFAXINE HCL ER 37.5 MG: 37.5 | 30 days supply | Qty: 30 | Fill #0

## 2019-11-16 ENCOUNTER — Encounter: Payer: Self-pay | Admitting: Family Medicine

## 2019-11-16 ENCOUNTER — Ambulatory Visit (INDEPENDENT_AMBULATORY_CARE_PROVIDER_SITE_OTHER): Payer: 59 | Admitting: Family Medicine

## 2019-11-16 ENCOUNTER — Other Ambulatory Visit: Payer: Self-pay

## 2019-11-16 VITALS — BP 110/82 | HR 58 | Ht 65.0 in | Wt 147.0 lb

## 2019-11-16 DIAGNOSIS — D3610 Benign neoplasm of peripheral nerves and autonomic nervous system, unspecified: Secondary | ICD-10-CM | POA: Insufficient documentation

## 2019-11-16 DIAGNOSIS — M999 Biomechanical lesion, unspecified: Secondary | ICD-10-CM | POA: Diagnosis not present

## 2019-11-16 MED ORDER — GABAPENTIN 100 MG PO CAPS: 200.0000 mg | ORAL_CAPSULE | Freq: Every day | ORAL | 0 refills | Status: DC

## 2019-11-16 MED FILL — GABAPENTIN 100 MG CAPSULE: 100 | 90 days supply | Qty: 180 | Fill #0

## 2019-11-16 NOTE — Progress Notes (Signed)
[facility] Phone: [phone] Subjective:    I'm seeing this patient by the request  of:  [name], [name], MD  CC: back pain, foot pain   [id number]   10/09/2019 Patient's new orthotics seem to cause patient to have significant lateral displacement noted that I think is contributing to more of the heel pain.  We discussed other over-the-counter orthotics and potentially having these adjusted.  Will attempt to send notes to the physician who did not make these.  In addition to this we discussed proper shoes.  We can consider other orthotics if beneficial.  Home exercises we have discussed before.  Follow-up again in 4 to 8 weeks.  Update 11/16/2019 [name] is a 64 y.o. female coming in with complaint of heel and back pain. Patient states that she is having pain over the top of her foot. Also notes tingling in her toes. Pain with walking. Is not having any back pain today. Is using Peloton bike quite often.  More numbness in foot.       Past Medical History:  Diagnosis Date  . Cancer ([place])    skin cancer  . Diverticulosis    CT  . Fibroids 09/07/2013  . PONV (postoperative nausea and vomiting)    non recent   . S/P hysterectomy with oophorectomy 09/07/2013  . Seasonal allergies   . SVD (spontaneous vaginal delivery)    x 1   Past Surgical History:  Procedure Laterality Date  . ABDOMINAL HYSTERECTOMY    . COLONOSCOPY     2 or 3  . COLONOSCOPY N/A 01/04/2017   Dr. Oneida Alar: Significant looping of the rectosigmoid colon, 4 mm hyperplastic polyp removed from rectum, internal hemorrhoids.  Next colonoscopy in 5 to 10 years with MAC and ultra slim colonoscope.  Marland Kitchen DILATION AND CURETTAGE OF UTERUS     x 3  . EUS N/A 11/16/2013   Dr. Ardis Hughs: parenchyma in tail of pancreas suggests chronic pancreatitis, abrupt changes in main pancreatic duct in tail becomes dilated up to 4-3m, numerous associated dilated side  branches, no solid mass. bx most c/w IPMN  . EYE SURGERY     lasik bilateral  . LAPAROSCOPY N/A 09/25/2013   Procedure: LAPAROSCOPY OPERATIVE;  Surgeon: VElveria Royals MD;  [facility];  Service: Gynecology;  Laterality: N/A;  . PANCREATECTOMY N/A 12/07/2013   Procedure: LAPAROSCOPIC DISTAL  PANCREATECTOMY;  Surgeon: FStark Klein MD;  [facility]  Service: General;  Laterality: N/A;  . POLYPECTOMY  01/04/2017   Procedure: POLYPECTOMY;  Surgeon: FDanie Binder MD;  [facility];  Service: Endoscopy;;  rectal  . ROBOTIC ASSISTED TOTAL HYSTERECTOMY WITH BILATERAL SALPINGO OOPHERECTOMY Bilateral 09/07/2013   Procedure: ROBOTIC ASSISTED TOTAL HYSTERECTOMY WITH BILATERAL SALPINGO OOPHORECTOMY;  Surgeon: VElveria Royals MD;  [facility];  Service: Gynecology;  Laterality: Bilateral;  . WISDOM TOOTH EXTRACTION     Social History   Socioeconomic History  . Marital status: Married    Spouse name: Not on file  . Number of children: Not on file  . Years of education: Not on file  . 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 Smoking status: Former Smoker    Packs/day: 1.00    Years: 10.00    Pack years: 10.00    Types: Cigarettes  Quit date: 01/02/1987    Years since quitting: 32.8  . Smokeless tobacco: Never Used  Substance and Sexual Activity  . Alcohol use: Yes    Comment: 2-3 glases of wine on weekend nights  . Drug use: No  . Sexual activity: Not Currently    Birth control/protection: Post-menopausal, Surgical  Other Topics Concern  . Not on file  Social History Narrative  . Not on file   Social Determinants of Health   Financial Resource Strain:   . Difficulty of Paying Living Expenses:   Food Insecurity:   . Worried About Charity fundraiser in the Last Year:   . Arboriculturist in the Last Year:   Transportation Needs:   . Lexicographer (Medical):   Marland Kitchen Lack of Transportation (Non-Medical):   Physical Activity:   . Days of Exercise per Week:   . Minutes of Exercise per Session:   Stress:   . Feeling of Stress :   Social Connections:   . Frequency of Communication with Friends and Family:   . Frequency of Social Gatherings with Friends and Family:   . Attends Religious Services:   . Active Member of Clubs or Organizations:   . Attends Archivist Meetings:   Marland Kitchen Marital Status:    No Known Allergies Family History  Problem Relation Age of Onset  . Cancer Mother 63       colon,lung   . COPD Mother   . Colon cancer Mother     Current Outpatient Medications (Endocrine & Metabolic):  .  estradiol (VIVELLE-DOT) 0.1 MG/24HR patch, Place 1 patch onto the skin 2 (two) times a week. Wednesdays and Saturdays   Current Outpatient Medications (Respiratory):  .  fexofenadine-pseudoephedrine (ALLEGRA-D) 60-120 MG per tablet, Take 1 tablet by mouth daily. .  fluticasone (FLONASE) 50 MCG/ACT nasal spray, Place 1 spray into both nostrils daily.   Current Outpatient Medications (Analgesics):  .  ibuprofen (ADVIL,MOTRIN) 200 MG tablet, Take 400 mg by mouth daily as needed for headache or moderate pain. .  meloxicam (MOBIC) 15 MG tablet, TAKE 1 TABLET BY MOUTH DAILY. (Patient taking differently: 3 times a week) .  meloxicam (MOBIC) 7.5 MG tablet, Take 1 tablet (7.5 mg total) by mouth daily.   Current Outpatient Medications (Other):  Marland Kitchen  ALPRAZolam (XANAX) 0.5 MG tablet, Take 0.5 mg by mouth daily as needed for anxiety. Marland Kitchen  amoxicillin-clavulanate (AUGMENTIN) 500-125 MG tablet, 1 PO BID FOR 10 DAYS .  ascorbic acid (VITAMIN C) 1000 MG tablet, Take 1,000 mg by mouth daily. Marland Kitchen  b complex vitamins tablet, Take 1 tablet by mouth daily. .  Calcium Carb-Cholecalciferol (CALCIUM 600+D3) 600-800 MG-UNIT TABS, Take 1 tablet by mouth daily. .  Diclofenac Sodium (PENNSAID) 2 % SOLN, Place 2 application onto the skin 2  (two) times daily. (Patient taking differently: Place 2 application onto the skin daily. ) .  Diclofenac Sodium 2 % SOLN, Place 2 g onto the skin 2 (two) times daily. .  Lactobacillus (ACIDOPHILUS PO), Take 1 tablet by mouth daily. .  Multiple Vitamin (MULTIVITAMIN) tablet, Take 1 tablet by mouth daily. .  Omega-3 Fatty Acids (FISH OIL PO), Take 1 capsule by mouth daily. Vladimir Faster Glycol-Propyl Glycol (SYSTANE OP), Apply 1 drop to eye 2 (two) times daily as needed (dry eyes). .  RESTASIS 0.05 % ophthalmic emulsion, Place 1 drop into both eyes 2 (two) times daily. Marland Kitchen  venlafaxine XR (EFFEXOR-XR) 37.5 MG 24 hr capsule,  TAKE 1 CAPSULE (37.5 MG TOTAL) BY MOUTH DAILY WITH BREAKFAST. Marland Kitchen  gabapentin (NEURONTIN) 100 MG capsule, Take 2 capsules (200 mg total) by mouth at bedtime.   Reviewed prior external information including notes and imaging from  primary care provider As well as notes that were available from care everywhere and other healthcare systems.  Past medical history, social, surgical and family history all reviewed in electronic medical record.  No pertanent information unless stated regarding to the chief complaint.   Review of Systems:  No headache, visual changes, nausea, vomiting, diarrhea, constipation, dizziness, abdominal pain, skin rash, fevers, chills, night sweats, weight loss, swollen lymph nodes, body aches, joint swelling, chest pain, shortness of breath, mood changes. POSITIVE muscle aches  Objective  Blood pressure 110/82, pulse (!) 58, height _0  (1.651 m), weight 147 lb (66.7 kg), SpO2 98 %.   General: No apparent distress alert and oriented x3 mood and affect normal, dressed appropriately.  HEENT: Pupils equal, extraocular movements intact  Respiratory: Patient's speak in full sentences and does not appear short of breath  Cardiovascular: No lower extremity edema, non tender, no erythema  Neuro: Cranial nerves II through XII are intact, neurovascularly intact in  all extremities with 2+ DTRs and 2+ pulses.  Gait normal with good balance and coordination.  MSK:  tender with full range of motion and good stability and symmetric strength and tone of shoulders, elbows, wrist, hip, knee and ankles bilaterally.  Left foot exam shows the patient does have a positive squeeze test.  Tender to palpation in the second intermetatarsal space.  Mild breakdown of the transverse arch noted.  Back exam does show some loss of lordosis, tightness noted in the paraspinal musculature lumbar spine right greater than left.  Positive Corky Sox.  Osteopathic findings C2 flexed rotated and side bent rightt C6 flexed rotated and side bent left T3 extended rotated and side bent right inhaled third rib T6 extended rotated and side bent left L2 flexed rotated and side bent right Sacrum right on right    Impression and Recommendations:     This case required medical decision making of moderate complexity. The above documentation has been reviewed and is accurate and complete [name], DO       Note: This dictation was prepared with Dragon dictation along with smaller phrase technology. Any transcriptional errors that result from this process are unintentional.

## 2019-11-16 NOTE — Patient Instructions (Signed)
Gabapentin  Exercises See me again in 4-6 weeks

## 2019-11-16 NOTE — Assessment & Plan Note (Signed)

## 2019-11-16 NOTE — Assessment & Plan Note (Signed)
Patient with a history of a stress fracture, hallux rigidus, breakdown of the transverse arch is all contributing encouraged her to follow through with the custom orthotics that I think will be beneficial.  Discussed home exercises again, given discussed gabapentin and given prescription.  Discussed Effexor.  Increase activity slowly.  Follow-up again in 4 to 8 weeks

## 2019-11-20 DIAGNOSIS — F4323 Adjustment disorder with mixed anxiety and depressed mood: Secondary | ICD-10-CM | POA: Diagnosis not present

## 2019-11-29 DIAGNOSIS — L820 Inflamed seborrheic keratosis: Secondary | ICD-10-CM | POA: Diagnosis not present

## 2019-11-29 DIAGNOSIS — D485 Neoplasm of uncertain behavior of skin: Secondary | ICD-10-CM | POA: Diagnosis not present

## 2019-11-29 DIAGNOSIS — B070 Plantar wart: Secondary | ICD-10-CM | POA: Diagnosis not present

## 2019-11-29 DIAGNOSIS — D2222 Melanocytic nevi of left ear and external auricular canal: Secondary | ICD-10-CM | POA: Diagnosis not present

## 2019-11-29 DIAGNOSIS — D225 Melanocytic nevi of trunk: Secondary | ICD-10-CM | POA: Diagnosis not present

## 2019-11-29 DIAGNOSIS — D1801 Hemangioma of skin and subcutaneous tissue: Secondary | ICD-10-CM | POA: Diagnosis not present

## 2019-11-29 DIAGNOSIS — L821 Other seborrheic keratosis: Secondary | ICD-10-CM | POA: Diagnosis not present

## 2019-11-29 DIAGNOSIS — Z85828 Personal history of other malignant neoplasm of skin: Secondary | ICD-10-CM | POA: Diagnosis not present

## 2019-11-29 DIAGNOSIS — L814 Other melanin hyperpigmentation: Secondary | ICD-10-CM | POA: Diagnosis not present

## 2019-11-30 DIAGNOSIS — F4323 Adjustment disorder with mixed anxiety and depressed mood: Secondary | ICD-10-CM | POA: Diagnosis not present

## 2019-12-01 ENCOUNTER — Other Ambulatory Visit: Payer: Self-pay

## 2019-12-01 ENCOUNTER — Other Ambulatory Visit (HOSPITAL_COMMUNITY)
Admission: RE | Admit: 2019-12-01 | Discharge: 2019-12-01 | Disposition: A | Discharge status: Home / Self Care | Payer: 59 | Source: Ambulatory Visit | Attending: Gastroenterology | Admitting: Gastroenterology

## 2019-12-01 ENCOUNTER — Encounter (HOSPITAL_COMMUNITY): Payer: Self-pay

## 2019-12-01 ENCOUNTER — Encounter (HOSPITAL_COMMUNITY)
Admission: RE | Admit: 2019-12-01 | Discharge: 2019-12-01 | Disposition: A | Discharge status: Home / Self Care | Payer: 59 | Source: Ambulatory Visit | Attending: Gastroenterology | Admitting: Gastroenterology

## 2019-12-01 DIAGNOSIS — Z01812 Encounter for preprocedural laboratory examination: Secondary | ICD-10-CM | POA: Insufficient documentation

## 2019-12-01 DIAGNOSIS — Z20822 Contact with and (suspected) exposure to covid-19: Secondary | ICD-10-CM | POA: Insufficient documentation

## 2019-12-02 LAB — SARS CORONAVIRUS 2 (TAT 6-24 HRS): SARS Coronavirus 2: NEGATIVE

## 2019-12-05 ENCOUNTER — Encounter (HOSPITAL_COMMUNITY): Payer: Self-pay | Admitting: Gastroenterology

## 2019-12-05 ENCOUNTER — Ambulatory Visit (HOSPITAL_COMMUNITY)
Admission: RE | Admit: 2019-12-05 | Discharge: 2019-12-05 | Disposition: A | Discharge status: Home / Self Care | Payer: 59 | Attending: Gastroenterology | Admitting: Gastroenterology

## 2019-12-05 ENCOUNTER — Ambulatory Visit (HOSPITAL_COMMUNITY): Payer: 59 | Admitting: Certified Registered Nurse Anesthetist

## 2019-12-05 ENCOUNTER — Encounter (HOSPITAL_COMMUNITY)
Admission: RE | Disposition: A | Discharge status: Home / Self Care | Payer: Self-pay | Source: Home / Self Care | Attending: Gastroenterology

## 2019-12-05 DIAGNOSIS — Z791 Long term (current) use of non-steroidal anti-inflammatories (NSAID): Secondary | ICD-10-CM | POA: Diagnosis not present

## 2019-12-05 DIAGNOSIS — K635 Polyp of colon: Secondary | ICD-10-CM

## 2019-12-05 DIAGNOSIS — K5730 Diverticulosis of large intestine without perforation or abscess without bleeding: Secondary | ICD-10-CM | POA: Insufficient documentation

## 2019-12-05 DIAGNOSIS — Z79899 Other long term (current) drug therapy: Secondary | ICD-10-CM | POA: Diagnosis not present

## 2019-12-05 DIAGNOSIS — Z85828 Personal history of other malignant neoplasm of skin: Secondary | ICD-10-CM | POA: Insufficient documentation

## 2019-12-05 DIAGNOSIS — J302 Other seasonal allergic rhinitis: Secondary | ICD-10-CM | POA: Diagnosis not present

## 2019-12-05 DIAGNOSIS — K648 Other hemorrhoids: Secondary | ICD-10-CM | POA: Diagnosis not present

## 2019-12-05 DIAGNOSIS — D120 Benign neoplasm of cecum: Secondary | ICD-10-CM | POA: Diagnosis not present

## 2019-12-05 DIAGNOSIS — R195 Other fecal abnormalities: Secondary | ICD-10-CM | POA: Diagnosis not present

## 2019-12-05 DIAGNOSIS — K644 Residual hemorrhoidal skin tags: Secondary | ICD-10-CM | POA: Insufficient documentation

## 2019-12-05 DIAGNOSIS — Z87891 Personal history of nicotine dependence: Secondary | ICD-10-CM | POA: Diagnosis not present

## 2019-12-05 DIAGNOSIS — Q438 Other specified congenital malformations of intestine: Secondary | ICD-10-CM | POA: Insufficient documentation

## 2019-12-05 DIAGNOSIS — Z79890 Hormone replacement therapy (postmenopausal): Secondary | ICD-10-CM | POA: Diagnosis not present

## 2019-12-05 HISTORY — PX: POLYPECTOMY: SHX5525

## 2019-12-05 HISTORY — PX: COLONOSCOPY WITH PROPOFOL: SHX5780

## 2019-12-05 SURGERY — COLONOSCOPY WITH PROPOFOL
Anesthesia: General

## 2019-12-05 MED ORDER — CHLORHEXIDINE GLUCONATE CLOTH 2 % EX PADS: 6.0000 | MEDICATED_PAD | Freq: Once | CUTANEOUS | Status: DC

## 2019-12-05 MED ORDER — PROPOFOL 10 MG/ML IV BOLUS
INTRAVENOUS | Status: AC
  Filled 2019-12-05: qty 160

## 2019-12-05 MED ORDER — PROPOFOL 10 MG/ML IV BOLUS
INTRAVENOUS | Status: DC | PRN
  Administered 2019-12-05: 100 mg via INTRAVENOUS
  Administered 2019-12-05: 50 mg via INTRAVENOUS

## 2019-12-05 MED ORDER — KETAMINE HCL 10 MG/ML IJ SOLN
INTRAMUSCULAR | Status: DC | PRN
  Administered 2019-12-05: 20 mg via INTRAVENOUS

## 2019-12-05 MED ORDER — PROPOFOL 500 MG/50ML IV EMUL
INTRAVENOUS | Status: DC | PRN
  Administered 2019-12-05: 150 ug/kg/min via INTRAVENOUS

## 2019-12-05 MED ORDER — KETAMINE HCL 50 MG/5ML IJ SOSY
PREFILLED_SYRINGE | INTRAMUSCULAR | Status: AC
  Filled 2019-12-05: qty 5

## 2019-12-05 MED ORDER — LIDOCAINE HCL (CARDIAC) PF 100 MG/5ML IV SOSY
PREFILLED_SYRINGE | INTRAVENOUS | Status: DC | PRN
  Administered 2019-12-05: 50 mg via INTRATRACHEAL

## 2019-12-05 MED ORDER — LACTATED RINGERS IV SOLN
INTRAVENOUS | Status: DC
  Administered 2019-12-05: 1000 mL via INTRAVENOUS

## 2019-12-05 NOTE — Anesthesia Preprocedure Evaluation (Signed)
Anesthesia Evaluation  Patient identified by MRN, date of birth, ID band Patient awake    Reviewed: Allergy & Precautions, H&P , NPO status , Patient's Chart, lab work & pertinent test results, reviewed documented beta blocker date and time   History of Anesthesia Complications (+) PONV and history of anesthetic complications  Airway Mallampati: II  TM Distance: >3 FB Neck ROM: full    Dental no notable dental hx.    Pulmonary neg pulmonary ROS, former smoker,    Pulmonary exam normal breath sounds clear to auscultation       Cardiovascular Exercise Tolerance: Good negative cardio ROS   Rhythm:regular Rate:Normal     Neuro/Psych negative neurological ROS  negative psych ROS   GI/Hepatic negative GI ROS, Neg liver ROS,   Endo/Other  negative endocrine ROS  Renal/GU negative Renal ROS  negative genitourinary   Musculoskeletal  (+) Arthritis ,   Abdominal   Peds negative pediatric ROS (+)  Hematology negative hematology ROS (+)   Anesthesia Other Findings   Reproductive/Obstetrics negative OB ROS                             Anesthesia Physical Anesthesia Plan  ASA: II  Anesthesia Plan: General   Post-op Pain Management:    Induction:   PONV Risk Score and Plan: 4 or greater and Propofol infusion  Airway Management Planned:   Additional Equipment:   Intra-op Plan:   Post-operative Plan:   Informed Consent: I have reviewed the patients History and Physical, chart, labs and discussed the procedure including the risks, benefits and alternatives for the proposed anesthesia with the patient or authorized representative who has indicated his/her understanding and acceptance.     Dental Advisory Given  Plan Discussed with: CRNA  Anesthesia Plan Comments:         Anesthesia Quick Evaluation

## 2019-12-05 NOTE — Transfer of Care (Signed)
Immediate Anesthesia Transfer of Care Note  Patient: [name]  Procedure(s) Performed: COLONOSCOPY WITH PROPOFOL (N/A ) POLYPECTOMY  Patient Location: PACU  Anesthesia Type:MAC  Level of Consciousness: awake, alert  and oriented  Airway & Oxygen Therapy: Patient Spontanous Breathing  Post-op Assessment: Report given to RN, Post -op Vital signs reviewed and stable and Patient moving all extremities X 4  Post vital signs: Reviewed and stable  Last Vitals:  Vitals Value Taken Time  BP 130/66 12/05/19 0812  Temp    Pulse    Resp 13 12/05/19 0814  SpO2    Vitals shown include unvalidated device data.  Last Pain:  Vitals:   12/05/19 0736  TempSrc:   PainSc: 0-No pain      Patients Stated Pain Goal: 7 (63/89/37 [id number])  Complications: No apparent anesthesia complications

## 2019-12-05 NOTE — Op Note (Signed)
[facility] Patient Name: [name] Procedure Date: 12/05/2019 7:09 AM MRN: [id number] Date of Birth: 1956/02/10 Attending MD: Barney Drain MD, MD CSN: [id number] Age: 64 Admit Type: Outpatient Procedure:                Colonoscopy WITH COLD SNARE POLYPECTOMY Indications:              Heme positive stool Providers:                Barney Drain MD, MD, Otis Peak B. Sharon Seller, RN, Aram Candela Referring MD:             Prince Solian MD Medicines:                Propofol per Anesthesia Complications:            No immediate complications. Estimated Blood Loss:     Estimated blood loss was minimal. Procedure:                Pre-Anesthesia Assessment:                           - Prior to the procedure, a History and Physical                            was performed, and patient medications and                            allergies were reviewed. The patient's tolerance of                            previous anesthesia was also reviewed. The risks                            and benefits of the procedure and the sedation                            options and risks were discussed with the patient.                            All questions were answered, and informed consent                            was obtained. Prior Anticoagulants: The patient has                            taken no previous anticoagulant or antiplatelet                            agents except for NSAID medication. ASA Grade                            Assessment: II - A patient with mild systemic  disease. After reviewing the risks and benefits,                            the patient was deemed in satisfactory condition to                            undergo the procedure. After obtaining informed                            consent, the colonoscope was passed under direct                            vision. Throughout the procedure, the patient's                             blood pressure, pulse, and oxygen saturations were                            monitored continuously. The [id number]                            scope was introduced through the anus and advanced                            to the 10 cm into the ileum. The colonoscopy was                            somewhat difficult due to a tortuous colon.                            Successful completion of the procedure was aided by                            straightening and shortening the scope to obtain                            bowel loop reduction and COLOWRAP. The patient                            tolerated the procedure fairly well. The quality of                            the bowel preparation was excellent. The terminal                            ileum, ileocecal valve, appendiceal orifice, and                            rectum were photographed. Scope In: 7:43:45 AM Scope Out: 8:07:28 AM Scope Withdrawal Time: 0 hours 18 minutes 14 seconds  Total Procedure Duration: 0 hours 23 minutes 43 seconds  Findings:      The terminal ileum appeared normal.      A 3 mm polyp was found  in the cecum. The polyp was sessile. The polyp       was removed with a cold snare. Resection and retrieval were complete.      A single large-mouthed diverticulum was found in the hepatic flexure.      Multiple small and large-mouthed diverticula were found in the       recto-sigmoid colon and sigmoid colon.      External and internal hemorrhoids were found.      The recto-sigmoid colon, sigmoid colon and descending colon were       moderately tortuous. Impression:               - The examined portion of the ileum was normal.                           - One 3 mm polyp in the cecum, removed with a cold                            snare. Resected and retrieved.                           - MILD Diverticulosis at the hepatic flexure.                           - MODERATE Diverticulosis in the recto-sigmoid                             colon and in the sigmoid colon.                           - HEME POSITIVE STOOLS/INTERMITTENT RECTAL BLEEDING                            DUE TO and internal hemorrhoids.                           - Tortuous LEFT colon. Moderate Sedation:      Per Anesthesia Care Recommendation:           - Patient has a contact number available for                            emergencies. The signs and symptoms of potential                            delayed complications were discussed with the                            patient. Return to normal activities tomorrow.                            Written discharge instructions were provided to the                            patient.                           -  High fiber diet.                           - Continue present medications.                           - Await pathology results.                           - Repeat colonoscopy in 5-10 years for surveillance. Procedure Code(s):        --- Professional ---                           [phone], Colonoscopy, flexible; with removal of                            tumor(s), polyp(s), or other lesion(s) by snare                            technique Diagnosis Code(s):        --- Professional ---                           K64.8, Other hemorrhoids                           K63.5, Polyp of colon                           R19.5, Other fecal abnormalities                           K57.30, Diverticulosis of large intestine without                            perforation or abscess without bleeding                           Q43.8, Other specified congenital malformations of                            intestine CPT copyright 2019 American Medical Association. All rights reserved. The codes documented in this report are preliminary and upon coder review may  be revised to meet current compliance requirements. Barney Drain, MD Barney Drain MD, MD 12/05/2019 8:23:38 AM This report has been signed  electronically. Number of Addenda: 0

## 2019-12-05 NOTE — H&P (Signed)
Primary Care Physician:  Prince Solian, MD Primary Gastroenterologist:  Dr. Oneida Alar  Pre-Procedure History & Physical: HPI:  [name] is a 64 y.o. female here for [name]. USES NSAIDS.  Past Medical History:  Diagnosis Date  . Cancer ([place])    skin cancer  . Diverticulosis    CT  . Fibroids 09/07/2013  . PONV (postoperative nausea and vomiting)    non recent   . S/P hysterectomy with oophorectomy 09/07/2013  . Seasonal allergies   . SVD (spontaneous vaginal delivery)    x 1    Past Surgical History:  Procedure Laterality Date  . ABDOMINAL HYSTERECTOMY    . COLONOSCOPY     2 or 3  . COLONOSCOPY N/A 01/04/2017   Dr. Oneida Alar: Significant looping of the rectosigmoid colon, 4 mm hyperplastic polyp removed from rectum, internal hemorrhoids.  Next colonoscopy in 5 to 10 years with MAC and ultra slim colonoscope.  Marland Kitchen DILATION AND CURETTAGE OF UTERUS     x 3  . EUS N/A 11/16/2013   Dr. Ardis Hughs: parenchyma in tail of pancreas suggests chronic pancreatitis, abrupt changes in main pancreatic duct in tail becomes dilated up to 4-60m, numerous associated dilated side branches, no solid mass. bx most c/w IPMN  . EYE SURGERY     lasik bilateral  . LAPAROSCOPY N/A 09/25/2013   Procedure: LAPAROSCOPY OPERATIVE;  Surgeon: VElveria Royals MD;  [facility];  Service: Gynecology;  Laterality: N/A;  . PANCREATECTOMY N/A 12/07/2013   Procedure: LAPAROSCOPIC DISTAL  PANCREATECTOMY;  Surgeon: FStark Klein MD;  [place] [facility]  Service: General;  Laterality: N/A;  . POLYPECTOMY  01/04/2017   Procedure: POLYPECTOMY;  Surgeon: FDanie Binder MD;  [facility];  Service: Endoscopy;;  rectal  . ROBOTIC ASSISTED TOTAL HYSTERECTOMY WITH BILATERAL SALPINGO OOPHERECTOMY Bilateral 09/07/2013   Procedure: ROBOTIC ASSISTED TOTAL HYSTERECTOMY WITH BILATERAL SALPINGO OOPHORECTOMY;  Surgeon: VElveria Royals MD;  [facility];  Service: Gynecology;  Laterality: Bilateral;  . WISDOM TOOTH  EXTRACTION      Prior to Admission medications   Medication Sig Start Date End Date Taking? Authorizing Provider  ALPRAZolam ([name] 0.5 MG tablet Take 0.5 mg by mouth daily as needed for anxiety.   Yes [provider]  ascorbic acid (VITAMIN C) 100 MG tablet Take 100 mg by mouth daily.    Yes [provider]  b complex vitamins tablet Take 1 tablet by mouth daily.   Yes [provider]  Calcium Carb-Cholecalciferol (CALCIUM 600+D3) 600-800 MG-UNIT TABS Take 1 tablet by mouth daily.   Yes [provider]  Diclofenac Sodium 2 % SOLN Place 2 g onto the skin 2 (two) times daily. Patient taking differently: Place 2 g onto the skin 2 (two) times daily as needed (pain).  05/29/19  Yes SLyndal Pulley DO  dimenhyDRINATE (DRAMAMINE) 50 MG tablet Take 50 mg by mouth in the morning and at bedtime.   Yes [provider]  estradiol (VIVELLE-DOT) 0.1 MG/24HR patch Place 1 patch onto the skin 2 (two) times a week. Wednesdays and Saturdays   Yes [provider]  fexofenadine (ALLEGRA) 180 MG tablet Take 180 mg by mouth daily as needed for allergies or rhinitis.   Yes [provider]  fexofenadine-pseudoephedrine (ALLEGRA-D) 60-120 MG per tablet Take 1 tablet by mouth daily.   Yes [provider]  fluticasone (FLONASE) 50 MCG/ACT nasal spray Place 1 spray into both nostrils in the morning and at bedtime.  Yes [provider]  gabapentin (NEURONTIN) 100 MG capsule Take 2 capsules (200 mg total) by mouth at bedtime. 11/16/19  Yes Hulan Saas M, DO  Lactobacillus (ACIDOPHILUS PO) Take 1 capsule by mouth daily.    Yes [provider]  MELATONIN PO Take 1 tablet by mouth at bedtime.   Yes [provider]  meloxicam (MOBIC) 15 MG tablet TAKE 1 TABLET BY MOUTH DAILY. Patient taking differently: Take 15 mg by mouth daily as needed for pain.  01/13/19  Yes [name], DO  Multiple Vitamin (MULTIVITAMIN) tablet  Take 1 tablet by mouth daily.   Yes [provider]  Omega-3 Fatty Acids (FISH OIL PO) Take 1 capsule by mouth daily.   Yes [provider]  Polyethyl Glycol-Propyl Glycol (SYSTANE OP) Place 1 drop into both eyes 2 (two) times daily as needed (dry eyes).    Yes [provider]  RESTASIS 0.05 % ophthalmic emulsion Place 1 drop into both eyes 2 (two) times daily. 11/17/16  Yes [provider]  venlafaxine XR (EFFEXOR-XR) 37.5 MG 24 hr capsule TAKE 1 CAPSULE (37.5 MG TOTAL) BY MOUTH DAILY WITH BREAKFAST. 11/08/19  Yes Hulan Saas M, DO  amoxicillin-clavulanate (AUGMENTIN) 500-125 MG tablet 1 PO BID FOR 10 DAYS Patient not taking: Reported on 11/23/2019 08/23/19   Danie Binder, MD  Diclofenac Sodium (PENNSAID) 2 % SOLN Place 2 application onto the skin 2 (two) times daily. Patient not taking: Reported on 11/23/2019 12/01/17   [name], DO  meloxicam (MOBIC) 7.5 MG tablet Take 1 tablet (7.5 mg total) by mouth daily. Patient not taking: Reported on 11/23/2019 10/09/19   [name], DO    Allergies as of 09/12/2019  . (No Known Allergies)    Family History  Problem Relation Age of Onset  . Cancer Mother 63       colon,lung   . COPD Mother   . Colon cancer Mother     Social History   Socioeconomic History  . Marital status: Married    Spouse name: Not on file  . Number of children: Not on file  . Years of education: Not on file  . Highest education level: Not on file  Occupational History    Employer: [employer]: [place] [employer], VP Advertising account executive  Tobacco Use  . Smoking status: Former Smoker    Packs/day: 1.00    Years: 10.00    Pack years: 10.00    Types: Cigarettes    Quit date: 01/02/1987    Years since quitting: 32.9  . Smokeless tobacco: Never Used  Substance and Sexual Activity  . Alcohol use: Yes    Comment: 2-3 glases of wine on weekend nights  . Drug use: No  . Sexual activity: Not Currently    Birth  control/protection: Post-menopausal, Surgical  Other Topics Concern  . Not on file  Social History Narrative  . Not on file   Social Determinants of Health   Financial Resource Strain:   . Difficulty of Paying Living Expenses:   Food Insecurity:   . Worried About Charity fundraiser in the Last Year:   . Arboriculturist in the Last Year:   Transportation Needs:   . Film/video editor (Medical):   Marland Kitchen Lack of Transportation (Non-Medical):   Physical Activity:   . Days of Exercise per Week:   . Minutes of Exercise per Session:   Stress:   . Feeling  of Stress :   Social Connections:   . Frequency of Communication with Friends and Family:   . Frequency of Social Gatherings with Friends and Family:   . Attends Religious Services:   . Active Member of Clubs or Organizations:   . Attends Archivist Meetings:   Marland Kitchen Marital Status:   Intimate Partner Violence:   . Fear of Current or Ex-Partner:   . Emotionally Abused:   Marland Kitchen Physically Abused:   . Sexually Abused:     Review of Systems: See HPI, otherwise negative ROS   Physical Exam: BP (!) 149/78   Temp 98.2 F (36.8 C) (Oral)   Resp 20   Ht _0  (1.676 m)   Wt 65.8 kg   SpO2 97%   BMI 23.40 kg/m  General:   Alert,  pleasant and cooperative in NAD Head:  Normocephalic and atraumatic. Neck:  Supple; Lungs:  Clear throughout to auscultation.    Heart:  Regular rate and rhythm. Abdomen:  Soft, nontender and nondistended. Normal bowel sounds, without guarding, and without rebound.   Neurologic:  Alert and  oriented x4;  grossly normal neurologically.  Impression/Plan:     HEME POS STOOLS  PLAN:  1.TCS TODAY. DISCUSSED PROCEDURE, BENEFITS, & RISKS: < 1% chance of medication reaction, bleeding, perforation, ASPIRATION, or rupture of spleen/liver requiring surgery to fix it and missed polyps < 1 cm 10-20% of the time.

## 2019-12-05 NOTE — Anesthesia Postprocedure Evaluation (Signed)
Anesthesia Post Note  Patient: [name]  Procedure(s) Performed: COLONOSCOPY WITH PROPOFOL (N/A ) POLYPECTOMY  Patient location during evaluation: Endoscopy Anesthesia Type: General Level of consciousness: awake and alert Pain management: pain level controlled Vital Signs Assessment: post-procedure vital signs reviewed and stable Respiratory status: spontaneous breathing, nonlabored ventilation, respiratory function stable and patient connected to nasal cannula oxygen Cardiovascular status: blood pressure returned to baseline and stable Postop Assessment: no apparent nausea or vomiting Anesthetic complications: no     Last Vitals:  Vitals:   12/05/19 0649  BP: (!) 149/78  Resp: 20  Temp: 36.8 C  SpO2: 97%    Last Pain:  Vitals:   12/05/19 0736  TempSrc:   PainSc: 0-No pain                 Talitha Givens

## 2019-12-05 NOTE — Discharge Instructions (Signed)
THE BLOOD DETECTED IN YOUR STOOL AND THE SMALL VOLUME RECTAL BLEEDING ARE DUE TO small internal hemorrhoids. YOU HAVE A SINGLE DIVERTICULUM AT THE HEPATIC FLEXURE AND MULTIPLE diverticula IN YOUR RECTOSIGMOID COLON. YOU HAD ONE SMALL POLYP REMOVED.    DRINK WATER TO KEEP YOUR URINE LIGHT YELLOW.  FOLLOW A HIGH FIBER DIET. AVOID ITEMS THAT CAUSE BLOATING. See info below.   USE PREPARATION H FOUR TIMES  A DAY IF NEEDED TO RELIEVE RECTAL PAIN/PRESSURE/BLEEDING.   YOUR BIOPSY RESULTS WILL BE BACK IN 5 BUSINESS DAYS.  Follow up with DR. KAVITHA NANDIGAM, [facility], [phone] IN 4-6 MOS.  Next colonoscopy AS SOON AS MAY 2026 AND NO LATER THAN MAY 2028.  Colonoscopy Care After Read the instructions outlined below and refer to this sheet in the next week. These discharge instructions provide you with general information on caring for yourself after you leave the hospital. While your treatment has been planned according to the most current medical practices available, unavoidable complications occasionally occur. If you have any problems or questions after discharge, call DR. Tylesha Gibeault, [phone].  ACTIVITY  You may resume your regular activity, but move at a slower pace for the next 24 hours.   Take frequent rest periods for the next 24 hours.   Walking will help get rid of the air and reduce the bloated feeling in your belly (abdomen).   No driving for 24 hours (because of the medicine (anesthesia) used during the test).   You may shower.   Do not sign any important legal documents or operate any machinery for 24 hours (because of the anesthesia used during the test).    NUTRITION  Drink plenty of fluids.   You may resume your normal diet as instructed by your doctor.   Begin with a light meal and progress to your normal diet. Heavy or fried foods are harder to digest and may make you feel sick to your stomach (nauseated).   Avoid alcoholic beverages for 24 hours or as  instructed.    MEDICATIONS  You may resume your normal medications.   WHAT YOU CAN EXPECT TODAY  Some feelings of bloating in the abdomen.   Passage of more gas than usual.   Spotting of blood in your stool or on the toilet paper  .  IF YOU HAD POLYPS REMOVED DURING THE COLONOSCOPY:  Eat a soft diet IF YOU HAVE NAUSEA, BLOATING, ABDOMINAL PAIN, OR VOMITING.    FINDING OUT THE RESULTS OF YOUR TEST Not all test results are available during your visit. DR. Oneida Alar WILL CALL YOU WITHIN 14 DAYS OF YOUR PROCEDUE WITH YOUR RESULTS. Do not assume everything is normal if you have not heard from DR. Temeka Pore, CALL HER OFFICE AT [phone].  SEEK IMMEDIATE MEDICAL ATTENTION AND CALL THE OFFICE: [phone] IF:  You have more than a spotting of blood in your stool.   Your belly is swollen (abdominal distention).   You are nauseated or vomiting.   You have a temperature over 101F.   You have abdominal pain or discomfort that is severe or gets worse throughout the day.  High-Fiber Diet A high-fiber diet changes your normal diet to include more whole grains, legumes, fruits, and vegetables. Changes in the diet involve replacing refined carbohydrates with unrefined foods. The calorie level of the diet is essentially unchanged. The Dietary Reference Intake (recommended amount) for adult males is 38 grams per day. For adult females, it is 25 grams per day. Pregnant and lactating women should  consume 28 grams of fiber per day. Fiber is the intact part of a plant that is not broken down during digestion. Functional fiber is fiber that has been isolated from the plant to provide a beneficial effect in the body.  PURPOSE  Increase stool bulk.   Ease and regulate bowel movements.   Lower cholesterol.   REDUCE RISK OF COLON CANCER  INDICATIONS THAT YOU NEED MORE FIBER  Constipation and hemorrhoids.   Uncomplicated diverticulosis (intestine condition) and irritable bowel syndrome.    Weight management.   As a protective measure against hardening of the arteries (atherosclerosis), diabetes, and cancer.   GUIDELINES FOR INCREASING FIBER IN THE DIET  Start adding fiber to the diet slowly. A gradual increase of about 5 more grams (2 servings of most fruits or vegetables) per day is best. Too rapid an increase in fiber may result in constipation, flatulence, and bloating.   Drink enough water and fluids to keep your urine clear or pale yellow. Water, juice, or caffeine-free drinks are recommended. Not drinking enough fluid may cause constipation.   Eat a variety of high-fiber foods rather than one type of fiber.   Try to increase your intake of fiber through using high-fiber foods rather than fiber pills or supplements that contain small amounts of fiber.   The goal is to change the types of food eaten. Do not supplement your present diet with high-fiber foods, but replace foods in your present diet.    Polyps, Colon  A polyp is extra tissue that grows inside your body. Colon polyps grow in the large intestine. The large intestine, also called the colon, is part of your digestive system. It is a long, hollow tube at the end of your digestive tract where your body makes and stores stool. Most polyps are not dangerous. They are benign. This means they are not cancerous. But over time, some types of polyps can turn into cancer. Polyps that are smaller than a pea are usually not harmful. But larger polyps could someday become or may already be cancerous. To be safe, doctors remove all polyps and test them.   PREVENTION There is not one sure way to prevent polyps. You might be able to lower your risk of getting them if you:  Eat more fruits and vegetables and less fatty food.   Do not smoke.   Avoid alcohol.   Exercise every day.   Lose weight if you are overweight.   Eating more calcium and folate can also lower your risk of getting polyps. Some foods that are rich in  calcium are milk, cheese, and broccoli. Some foods that are rich in folate are chickpeas, kidney beans, and spinach.    Diverticulosis Diverticulosis is a common condition that develops when small pouches (diverticula) form in the wall of the colon. The risk of diverticulosis increases with age. It happens more often in people who eat a low-fiber diet. Most individuals with diverticulosis have no symptoms. Those individuals with symptoms usually experience belly (abdominal) pain, constipation, or loose stools (diarrhea).  HOME CARE INSTRUCTIONS  Increase the amount of fiber in your diet as directed by your caregiver or dietician. This may reduce symptoms of diverticulosis.   Drink at least 6 to 8 glasses of water each day to prevent constipation.   Try not to strain when you have a bowel movement.   Avoiding nuts and seeds to prevent complications is NOT NECESSARY.   FOODS HAVING HIGH FIBER CONTENT INCLUDE:  Fruits. Apple,  peach, pear, tangerine, raisins, prunes.   Vegetables. Brussels sprouts, asparagus, broccoli, cabbage, carrot, cauliflower, romaine lettuce, spinach, summer squash, tomato, winter squash, zucchini.   Starchy Vegetables. Baked beans, kidney beans, lima beans, split peas, lentils, potatoes (with skin).   SEEK IMMEDIATE MEDICAL CARE IF:  You develop increasing pain or severe bloating.   You have an oral temperature above 101F.   You develop vomiting or bowel movements that are bloody or black.

## 2019-12-06 LAB — SURGICAL PATHOLOGY

## 2019-12-07 ENCOUNTER — Other Ambulatory Visit: Payer: Self-pay | Admitting: Family Medicine

## 2019-12-07 MED FILL — VENLAFAXINE HCL ER 37.5 MG: 37.5 | 30 days supply | Qty: 30 | Fill #0

## 2019-12-19 ENCOUNTER — Telehealth: Payer: Self-pay | Admitting: Gastroenterology

## 2019-12-19 NOTE — Telephone Encounter (Signed)
Recall for ct °

## 2019-12-19 NOTE — Telephone Encounter (Signed)
Pt had CT 08/2019 since recall was advised.

## 2019-12-26 MED FILL — ESTRADIOL PATCH 0.0375: 0.0375 | 84 days supply | Qty: 24 | Fill #1

## 2019-12-28 ENCOUNTER — Ambulatory Visit: Payer: 59 | Admitting: Family Medicine

## 2019-12-28 ENCOUNTER — Encounter: Payer: Self-pay | Admitting: Family Medicine

## 2019-12-28 ENCOUNTER — Other Ambulatory Visit: Payer: Self-pay

## 2019-12-28 ENCOUNTER — Ambulatory Visit (INDEPENDENT_AMBULATORY_CARE_PROVIDER_SITE_OTHER): Payer: 59

## 2019-12-28 VITALS — BP 140/84 | HR 58 | Ht 66.0 in | Wt 148.0 lb

## 2019-12-28 DIAGNOSIS — M999 Biomechanical lesion, unspecified: Secondary | ICD-10-CM | POA: Diagnosis not present

## 2019-12-28 DIAGNOSIS — M79671 Pain in right foot: Secondary | ICD-10-CM | POA: Diagnosis not present

## 2019-12-28 DIAGNOSIS — M545 Low back pain, unspecified: Secondary | ICD-10-CM

## 2019-12-28 DIAGNOSIS — D3610 Benign neoplasm of peripheral nerves and autonomic nervous system, unspecified: Secondary | ICD-10-CM | POA: Diagnosis not present

## 2019-12-28 DIAGNOSIS — G8929 Other chronic pain: Secondary | ICD-10-CM | POA: Diagnosis not present

## 2019-12-28 NOTE — Assessment & Plan Note (Signed)
Moderate overall.  Has had an exacerbation.  Patient is doing relatively well with conservative therapy.  Discussed icing regimen and home exercise, which activities to do which wants to avoid.  Responded well to manipulation.  Follow-up again in 4 to 8 weeks

## 2019-12-28 NOTE — Progress Notes (Signed)
[place] [address] [place] Phone: [phone] Subjective:   I [name] am serving as a Education administrator for Dr. Hulan Saas.  This visit occurred during the SARS-CoV-2 public health emergency.  Safety protocols were in place, including screening questions prior to the visit, additional usage of staff PPE, and extensive cleaning of exam room while observing appropriate contact time as indicated for disinfecting solutions.   I'm seeing this patient by the request  of:  [name], [name], MD  CC: Back pain follow-up, foot pain  [id number]  [name] is a 64 y.o. female coming in with complaint of foot pain. Last seen on 11/16/2019 for OMT. Patient states she has purchased orthotics. Doing exercises. States the neuroma is painful with biking (standing position) and walking. Feels the pain the most with riding her bike.       Past Medical History:  Diagnosis Date  . Cancer ([place])    skin cancer  . Diverticulosis    CT  . Fibroids 09/07/2013  . PONV (postoperative nausea and vomiting)    non recent   . S/P hysterectomy with oophorectomy 09/07/2013  . Seasonal allergies   . SVD (spontaneous vaginal delivery)    x 1   Past Surgical History:  Procedure Laterality Date  . ABDOMINAL HYSTERECTOMY    . COLONOSCOPY     2 or 3  . COLONOSCOPY N/A 01/04/2017   Dr. Oneida Alar: Significant looping of the rectosigmoid colon, 4 mm hyperplastic polyp removed from rectum, internal hemorrhoids.  Next colonoscopy in 5 to 10 years with MAC and ultra slim colonoscope.  . COLONOSCOPY WITH PROPOFOL N/A 12/05/2019   Procedure: COLONOSCOPY WITH PROPOFOL;  Surgeon: Danie Binder, MD;  [facility];  Service: Endoscopy;  Laterality: N/A;  7:30am  . DILATION AND CURETTAGE OF UTERUS     x 3  . EUS N/A 11/16/2013   Dr. Ardis Hughs: parenchyma in tail of pancreas suggests chronic pancreatitis, abrupt changes in main pancreatic duct in tail becomes dilated  up to 4-109m, numerous associated dilated side branches, no solid mass. bx most c/w IPMN  . EYE SURGERY     lasik bilateral  . LAPAROSCOPY N/A 09/25/2013   Procedure: LAPAROSCOPY OPERATIVE;  Surgeon: VElveria Royals MD;  [facility];  Service: Gynecology;  Laterality: N/A;  . PANCREATECTOMY N/A 12/07/2013   Procedure: LAPAROSCOPIC DISTAL  PANCREATECTOMY;  Surgeon: FStark Klein MD;  [facility]  Service: General;  Laterality: N/A;  . POLYPECTOMY  01/04/2017   Procedure: POLYPECTOMY;  Surgeon: FDanie Binder MD;  [facility];  Service: Endoscopy;;  rectal  . POLYPECTOMY  12/05/2019   Procedure: POLYPECTOMY;  Surgeon: FDanie Binder MD;  [facility];  Service: Endoscopy;;  . ROBOTIC ASSISTED TOTAL HYSTERECTOMY WITH BILATERAL SALPINGO OOPHERECTOMY Bilateral 09/07/2013   Procedure: ROBOTIC ASSISTED TOTAL HYSTERECTOMY WITH BILATERAL SALPINGO OOPHORECTOMY;  Surgeon: VElveria Royals MD;  [facility];  Service: Gynecology;  Laterality: Bilateral;  . WISDOM TOOTH EXTRACTION     Social History   Socioeconomic History  . Marital status: Married    Spouse name: Not on file  . Number of children: Not on file  . Years of education: Not on file  . 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 Smoking status: Former Smoker  Packs/day: 1.00    Years: 10.00    Pack years: 10.00    Types: Cigarettes    Quit date: 01/02/1987    Years since quitting: 33.0  . Smokeless tobacco: Never Used  Substance and Sexual Activity  . Alcohol use: Yes    Comment: 2-3 glases of wine on weekend nights  . Drug use: No  . Sexual activity: Not Currently    Birth control/protection: Post-menopausal, Surgical  Other Topics Concern  . Not on file  Social History Narrative  . Not on file   Social Determinants of Health   Financial Resource Strain:   . Difficulty of  Paying Living Expenses:   Food Insecurity:   . Worried About Charity fundraiser in the Last Year:   . Arboriculturist in the Last Year:   Transportation Needs:   . Film/video editor (Medical):   Marland Kitchen Lack of Transportation (Non-Medical):   Physical Activity:   . Days of Exercise per Week:   . Minutes of Exercise per Session:   Stress:   . Feeling of Stress :   Social Connections:   . Frequency of Communication with Friends and Family:   . Frequency of Social Gatherings with Friends and Family:   . Attends Religious Services:   . Active Member of Clubs or Organizations:   . Attends Archivist Meetings:   Marland Kitchen Marital Status:    No Known Allergies Family History  Problem Relation Age of Onset  . Cancer Mother 50       colon,lung   . COPD Mother   . Colon cancer Mother     Current Outpatient Medications (Endocrine & Metabolic):  .  estradiol (VIVELLE-DOT) 0.1 MG/24HR patch, Place 1 patch onto the skin 2 (two) times a week. Wednesdays and Saturdays   Current Outpatient Medications (Respiratory):  .  fexofenadine (ALLEGRA) 180 MG tablet, Take 180 mg by mouth daily as needed for allergies or rhinitis. .  fexofenadine-pseudoephedrine (ALLEGRA-D) 60-120 MG per tablet, Take 1 tablet by mouth daily. .  fluticasone (FLONASE) 50 MCG/ACT nasal spray, Place 1 spray into both nostrils in the morning and at bedtime.   Current Outpatient Medications (Analgesics):  .  meloxicam (MOBIC) 15 MG tablet, TAKE 1 TABLET BY MOUTH DAILY. (Patient taking differently: Take 15 mg by mouth daily as needed for pain. )   Current Outpatient Medications (Other):  Marland Kitchen  ALPRAZolam (XANAX) 0.5 MG tablet, Take 0.5 mg by mouth daily as needed for anxiety. Marland Kitchen  ascorbic acid (VITAMIN C) 100 MG tablet, Take 100 mg by mouth daily.  Marland Kitchen  b complex vitamins tablet, Take 1 tablet by mouth daily. .  Calcium Carb-Cholecalciferol (CALCIUM 600+D3) 600-800 MG-UNIT TABS, Take 1 tablet by mouth daily. .  Diclofenac  Sodium 2 % SOLN, Place 2 g onto the skin 2 (two) times daily. (Patient taking differently: Place 2 g onto the skin 2 (two) times daily as needed (pain). ) .  dimenhyDRINATE (DRAMAMINE) 50 MG tablet, Take 50 mg by mouth in the morning and at bedtime. .  gabapentin (NEURONTIN) 100 MG capsule, Take 2 capsules (200 mg total) by mouth at bedtime. .  Lactobacillus (ACIDOPHILUS PO), Take 1 capsule by mouth daily.  Marland Kitchen  MELATONIN PO, Take 1 tablet by mouth at bedtime. .  Multiple Vitamin (MULTIVITAMIN) tablet, Take 1 tablet by mouth daily. .  Omega-3 Fatty Acids (FISH OIL PO), Take 1 capsule by mouth daily. Vladimir Faster Glycol-Propyl Glycol (SYSTANE OP), Place  1 drop into both eyes 2 (two) times daily as needed (dry eyes).  .  RESTASIS 0.05 % ophthalmic emulsion, Place 1 drop into both eyes 2 (two) times daily. Marland Kitchen  venlafaxine XR (EFFEXOR-XR) 37.5 MG 24 hr capsule, TAKE 1 CAPSULE (37.5 MG TOTAL) BY MOUTH DAILY WITH BREAKFAST.   Reviewed prior external information including notes and imaging from  primary care provider As well as notes that were available from care everywhere and other healthcare systems.  Past medical history, social, surgical and family history all reviewed in electronic medical record.  No pertanent information unless stated regarding to the chief complaint.   Review of Systems:  No headache, visual changes, nausea, vomiting, diarrhea, constipation, dizziness, abdominal pain, skin rash, fevers, chills, night sweats, weight loss, swollen lymph nodes, body aches, joint swelling, chest pain, shortness of breath, mood changes. POSITIVE muscle aches  Objective  Blood pressure 140/84, pulse (!) 58, height _0  (1.676 m), weight 148 lb (67.1 kg), SpO2 98 %.   General: No apparent distress alert and oriented x3 mood and affect normal, dressed appropriately.  HEENT: Pupils equal, extraocular movements intact  Respiratory: Patient's speak in full sentences and does not appear short of breath   Cardiovascular: No lower extremity edema, non tender, no erythema  Neuro: Cranial nerves II through XII are intact, neurovascularly intact in all extremities with 2+ DTRs and 2+ pulses.  Gait normal with good balance and coordination.  MSK: Back exam shows the patient does have some tightness noted in the parascapular region more in the thoracolumbar juncture.  Patient does have tightness with FABER test bilaterally.  Negative straight leg test.  More pain though with extension of the back.  Foot exam shows the patient does have breakdown of the transverse arch right greater than left.  Severe tenderness actually on the plantar aspect under the fourth toe.  Positive squeeze test noted.  Procedure: Real-time Ultrasound Guided Injection of right foot bursitis Device: GE Logiq Q7 Ultrasound guided injection is preferred based studies that show increased duration, increased effect, greater accuracy, decreased procedural pain, increased response rate, and decreased cost with ultrasound guided versus blind injection.  Verbal informed consent obtained.  Time-out conducted.  Noted no overlying erythema, induration, or other signs of local infection.  Skin prepped in a sterile fashion.  Local anesthesia: Topical Ethyl chloride.  With sterile technique and under real time ultrasound guidance: With a 25-gauge half inch needle injected with 0.5 cc of 0.5% Marcaine and 0.5 cc of Kenalog 40 mg/mL Completed without difficulty  Pain immediately resolved suggesting accurate placement of the medication.  Advised to call if fevers/chills, erythema, induration, drainage, or persistent bleeding.  Images permanently stored and available for review in the ultrasound unit.  Impression: Technically successful ultrasound guided injection.    Osteopathic findings  C4 flexed rotated and side bent left C7 flexed rotated and side bent left T3 extended rotated and side bent right inhaled third rib T9 extended rotated  and side bent left L2 flexed rotated and side bent right Sacrum right on right  Impression and Recommendations:     This case required medical decision making of moderate complexity. The above documentation has been reviewed and is accurate and complete [name], DO       Note: This dictation was prepared with Dragon dictation along with smaller phrase technology. Any transcriptional errors that result from this process are unintentional.

## 2019-12-28 NOTE — Assessment & Plan Note (Signed)
Patient did have changes to the orthotic done today.  Chronic problem with mild exacerbation.  Discussed again about how the gabapentin could be helping the neuroma.  Patient given an injection for more of a bursitis in the toe today that was noted underneath the fourth metatarsal head.  Hopefully this will be beneficial.  Discussed continuing the home exercises icing regimen.  Follow-up again in 4 to 8 weeks

## 2019-12-28 NOTE — Assessment & Plan Note (Signed)

## 2019-12-28 NOTE — Patient Instructions (Signed)
Have a good weekend Made changes to orthotics Injected bursitis  See me again in 6-7 weeks

## 2020-01-04 ENCOUNTER — Other Ambulatory Visit: Payer: Self-pay | Admitting: Family Medicine

## 2020-01-04 MED FILL — VENLAFAXINE HCL ER 37.5 MG: 37.5 | 30 days supply | Qty: 30 | Fill #0

## 2020-01-05 ENCOUNTER — Other Ambulatory Visit: Payer: Self-pay | Admitting: Obstetrics & Gynecology

## 2020-01-05 DIAGNOSIS — Z1231 Encounter for screening mammogram for malignant neoplasm of breast: Secondary | ICD-10-CM

## 2020-01-16 DIAGNOSIS — J309 Allergic rhinitis, unspecified: Secondary | ICD-10-CM | POA: Diagnosis not present

## 2020-01-16 MED FILL — AMOX-CLAV 875-125 MG TABLET: 875-125 | 7 days supply | Qty: 14 | Fill #0

## 2020-01-19 MED FILL — MONTELUKAST SOD 10 MG TAB: 10 | 90 days supply | Qty: 90 | Fill #0

## 2020-01-30 ENCOUNTER — Encounter: Payer: Self-pay | Admitting: Family Medicine

## 2020-02-06 ENCOUNTER — Ambulatory Visit
Admission: RE | Admit: 2020-02-06 | Discharge: 2020-02-06 | Disposition: A | Discharge status: Home / Self Care | Payer: 59 | Source: Ambulatory Visit

## 2020-02-06 ENCOUNTER — Other Ambulatory Visit: Payer: Self-pay

## 2020-02-06 DIAGNOSIS — Z1231 Encounter for screening mammogram for malignant neoplasm of breast: Secondary | ICD-10-CM | POA: Diagnosis not present

## 2020-02-07 ENCOUNTER — Other Ambulatory Visit: Payer: Self-pay | Admitting: Family Medicine

## 2020-02-07 MED FILL — VENLAFAXINE HCL ER 37.5 MG: 37.5 | 30 days supply | Qty: 30 | Fill #0

## 2020-02-08 ENCOUNTER — Encounter: Payer: Self-pay | Admitting: Family Medicine

## 2020-02-26 ENCOUNTER — Other Ambulatory Visit (HOSPITAL_COMMUNITY): Payer: Self-pay | Admitting: Allergy and Immunology

## 2020-02-26 DIAGNOSIS — H1045 Other chronic allergic conjunctivitis: Secondary | ICD-10-CM | POA: Diagnosis not present

## 2020-02-26 DIAGNOSIS — J019 Acute sinusitis, unspecified: Secondary | ICD-10-CM | POA: Diagnosis not present

## 2020-02-26 DIAGNOSIS — J3089 Other allergic rhinitis: Secondary | ICD-10-CM | POA: Diagnosis not present

## 2020-02-26 DIAGNOSIS — J300 Vasomotor rhinitis: Secondary | ICD-10-CM | POA: Diagnosis not present

## 2020-02-26 MED FILL — LEVOCETIRIZINE 5 MG TABLET: 5 | 30 days supply | Qty: 30 | Fill #0

## 2020-02-26 MED FILL — IPRATROPIUM 0.03% SPRAY: 0.03 | 30 days supply | Qty: 30 | Fill #0

## 2020-02-27 MED FILL — RESTASIS 0.05% EYE EMULSION: 0.05 | 30 days supply | Qty: 60 | Fill #2

## 2020-03-12 ENCOUNTER — Other Ambulatory Visit: Payer: Self-pay | Admitting: Family Medicine

## 2020-03-12 MED FILL — ESTRADIOL PATCH 0.0375: 0.0375 | 84 days supply | Qty: 24 | Fill #2

## 2020-03-13 MED FILL — VENLAFAXINE HCL ER 37.5 MG: 37.5 | 30 days supply | Qty: 30 | Fill #0

## 2020-03-13 MED FILL — MELOXICAM 7.5 MG TABLET: 7.5 | 30 days supply | Qty: 30 | Fill #0

## 2020-03-20 MED FILL — FLUCONAZOLE 150 MG TABS: 150 | 7 days supply | Qty: 3 | Fill #0

## 2020-03-28 MED FILL — IPRATROPIUM 0.03% SPRAY: 0.03 | 30 days supply | Qty: 30 | Fill #1

## 2020-03-28 MED FILL — LEVOCETIRIZINE 5 MG TABLET: 5 | 30 days supply | Qty: 30 | Fill #1

## 2020-04-01 NOTE — Progress Notes (Signed)
[place] Phone: [phone] Subjective:   [name], am serving as a scribe for Dr. Hulan Saas. This visit occurred during the SARS-CoV-2 public health emergency.  Safety protocols were in place, including screening questions prior to the visit, additional usage of staff PPE, and extensive cleaning of exam room while observing appropriate contact time as indicated for disinfecting solutions.   I'm seeing this patient by the request  of:  [name], [name], MD  CC: Neck pain, upper back pain  [id number]   12/28/2019 Moderate overall.  Has had an exacerbation.  Patient is doing relatively well with conservative therapy.  Discussed icing regimen and home exercise, which activities to do which wants to avoid.  Responded well to manipulation.  Follow-up again in 4 to 8 weeks  Patient did have changes to the orthotic done today.  Chronic problem with mild exacerbation.  Discussed again about how the gabapentin could be helping the neuroma.  Patient given an injection for more of a bursitis in the toe today that was noted underneath the fourth metatarsal head.  Hopefully this will be beneficial.  Discussed continuing the home exercises icing regimen.  Follow-up again in 4 to 8 weeks  Update 04/02/2020 [name] is a 64 y.o. female coming in with complaint of back pain. Hip GT injection last visit as well as OMT performed. Patient states that her neck started to bother her 2 weeks ago. Pain is in right side of neck and shoulder. Rotation to right is painful. Using Pennsaid on neck as well as meloxicam.  Patient states it is more of a tightness.  Has been significantly more stress recently as well.  Patient has been care still have her husband. Patient is taking Effexor which is still very helpful    Past Medical History:  Diagnosis Date  . Cancer ([place])    skin cancer  . Diverticulosis    CT  . Fibroids  09/07/2013  . PONV (postoperative nausea and vomiting)    non recent   . S/P hysterectomy with oophorectomy 09/07/2013  . Seasonal allergies   . SVD (spontaneous vaginal delivery)    x 1   Past Surgical History:  Procedure Laterality Date  . ABDOMINAL HYSTERECTOMY    . COLONOSCOPY     2 or 3  . COLONOSCOPY N/A 01/04/2017   Dr. Oneida Alar: Significant looping of the rectosigmoid colon, 4 mm hyperplastic polyp removed from rectum, internal hemorrhoids.  Next colonoscopy in 5 to 10 years with MAC and ultra slim colonoscope.  . COLONOSCOPY WITH PROPOFOL N/A 12/05/2019   Procedure: COLONOSCOPY WITH PROPOFOL;  Surgeon: Danie Binder, MD;  [facility];  Service: Endoscopy;  Laterality: N/A;  7:30am  . DILATION AND CURETTAGE OF UTERUS     x 3  . EUS N/A 11/16/2013   Dr. Ardis Hughs: parenchyma in tail of pancreas suggests chronic pancreatitis, abrupt changes in main pancreatic duct in tail becomes dilated up to 4-67m, numerous associated dilated side branches, no solid mass. bx most c/w IPMN  . EYE SURGERY     lasik bilateral  . LAPAROSCOPY N/A 09/25/2013   Procedure: LAPAROSCOPY OPERATIVE;  Surgeon: VElveria Royals MD;  [facility];  Service: Gynecology;  Laterality: N/A;  . PANCREATECTOMY N/A 12/07/2013   Procedure: LAPAROSCOPIC DISTAL  PANCREATECTOMY;  Surgeon: FStark Klein MD;  [facility]  Service: General;  Laterality: N/A;  . POLYPECTOMY  01/04/2017  Procedure: POLYPECTOMY;  Surgeon: Danie Binder, MD;  [facility];  Service: Endoscopy;;  rectal  . POLYPECTOMY  12/05/2019   Procedure: POLYPECTOMY;  Surgeon: Danie Binder, MD;  [facility];  Service: Endoscopy;;  . ROBOTIC ASSISTED TOTAL HYSTERECTOMY WITH BILATERAL SALPINGO OOPHERECTOMY Bilateral 09/07/2013   Procedure: ROBOTIC ASSISTED TOTAL HYSTERECTOMY WITH BILATERAL SALPINGO OOPHORECTOMY;  Surgeon: Elveria Royals, MD;  Location: [facility];  Service: Gynecology;  Laterality: Bilateral;  . WISDOM TOOTH  EXTRACTION     Social History   Socioeconomic History  . Marital status: Married    Spouse name: Not on file  . Number of children: Not on file  . Years of education: Not on file  . Highest education level: Not on file  Occupational History    Employer: [employer], VP Advertising account executive  Tobacco Use  . Smoking status: Former Smoker    Packs/day: 1.00    Years: 10.00    Pack years: 10.00    Types: Cigarettes    Quit date: 01/02/1987    Years since quitting: 33.2  . Smokeless tobacco: Never Used  Vaping Use  . Vaping Use: Never used  Substance and Sexual Activity  . Alcohol use: Yes    Comment: 2-3 glases of wine on weekend nights  . Drug use: No  . Sexual activity: Not Currently    Birth control/protection: Post-menopausal, Surgical  Other Topics Concern  . Not on file  Social History Narrative  . Not on file   Social Determinants of Health   Financial Resource Strain:   . Difficulty of Paying Living Expenses: Not on file  Food Insecurity:   . Worried About Charity fundraiser in the Last Year: Not on file  . Ran Out of Food in the Last Year: Not on file  Transportation Needs:   . Lack of Transportation (Medical): Not on file  . Lack of Transportation (Non-Medical): Not on file  Physical Activity:   . Days of Exercise per Week: Not on file  . Minutes of Exercise per Session: Not on file  Stress:   . Feeling of Stress : Not on file  Social Connections:   . Frequency of Communication with Friends and Family: Not on file  . Frequency of Social Gatherings with Friends and Family: Not on file  . Attends Religious Services: Not on file  . Active Member of Clubs or Organizations: Not on file  . Attends Archivist Meetings: Not on file  . Marital Status: Not on file   No Known Allergies Family History  Problem Relation Age of Onset  . Cancer Mother 13       colon,lung   . COPD Mother   . Colon cancer Mother     Current  Outpatient Medications (Endocrine & Metabolic):  .  estradiol (VIVELLE-DOT) 0.1 MG/24HR patch, Place 1 patch onto the skin 2 (two) times a week. Wednesdays and Saturdays   Current Outpatient Medications (Respiratory):  .  fexofenadine (ALLEGRA) 180 MG tablet, Take 180 mg by mouth daily as needed for allergies or rhinitis. .  fexofenadine-pseudoephedrine (ALLEGRA-D) 60-120 MG per tablet, Take 1 tablet by mouth daily. .  fluticasone (FLONASE) 50 MCG/ACT nasal spray, Place 1 spray into both nostrils in the morning and at bedtime.   Current Outpatient Medications (Analgesics):  .  meloxicam (MOBIC) 15 MG tablet, TAKE 1 TABLET BY MOUTH DAILY. (Patient taking differently: Take 15 mg by  mouth daily as needed for pain. ) .  meloxicam (MOBIC) 7.5 MG tablet, TAKE 1 TABLET (7.5 MG TOTAL) BY MOUTH DAILY.   Current Outpatient Medications (Other):  Marland Kitchen  ALPRAZolam (XANAX) 0.5 MG tablet, Take 0.5 mg by mouth daily as needed for anxiety. Marland Kitchen  ascorbic acid (VITAMIN C) 100 MG tablet, Take 100 mg by mouth daily.  Marland Kitchen  b complex vitamins tablet, Take 1 tablet by mouth daily. .  Calcium Carb-Cholecalciferol (CALCIUM 600+D3) 600-800 MG-UNIT TABS, Take 1 tablet by mouth daily. .  Diclofenac Sodium 2 % SOLN, Place 2 g onto the skin 2 (two) times daily. (Patient taking differently: Place 2 g onto the skin 2 (two) times daily as needed (pain). ) .  dimenhyDRINATE (DRAMAMINE) 50 MG tablet, Take 50 mg by mouth in the morning and at bedtime. .  gabapentin (NEURONTIN) 100 MG capsule, Take 2 capsules (200 mg total) by mouth at bedtime. .  Lactobacillus (ACIDOPHILUS PO), Take 1 capsule by mouth daily.  Marland Kitchen  MELATONIN PO, Take 1 tablet by mouth at bedtime. .  Multiple Vitamin (MULTIVITAMIN) tablet, Take 1 tablet by mouth daily. .  Omega-3 Fatty Acids (FISH OIL PO), Take 1 capsule by mouth daily. Vladimir Faster Glycol-Propyl Glycol (SYSTANE OP), Place 1 drop into both eyes 2 (two) times daily as needed (dry eyes).  .  RESTASIS 0.05  % ophthalmic emulsion, Place 1 drop into both eyes 2 (two) times daily. Marland Kitchen  venlafaxine XR (EFFEXOR-XR) 37.5 MG 24 hr capsule, TAKE 1 CAPSULE (37.5 MG TOTAL) BY MOUTH DAILY WITH BREAKFAST.   Reviewed prior external information including notes and imaging from  primary care provider As well as notes that were available from care everywhere and other healthcare systems.  Past medical history, social, surgical and family history all reviewed in electronic medical record.  No pertanent information unless stated regarding to the chief complaint.   Review of Systems:  No headache, visual changes, nausea, vomiting, diarrhea, constipation, dizziness, abdominal pain, skin rash, fevers, chills, night sweats, weight loss, swollen lymph nodes, body aches, joint swelling, chest pain, shortness of breath, mood changes. POSITIVE muscle aches  Objective  Blood pressure 116/72, pulse 64, height _0  (1.676 m), weight 148 lb (67.1 kg), SpO2 99 %.   General: No apparent distress alert and oriented x3 mood and affect normal, dressed appropriately.  HEENT: Pupils equal, extraocular movements intact  Respiratory: Patient's speak in full sentences and does not appear short of breath  Cardiovascular: No lower extremity edema, non tender, no erythema  Neuro: Cranial nerves II through XII are intact, neurovascularly intact in all extremities with 2+ DTRs and 2+ pulses.  Gait normal with good balance and coordination.  MSK:  Non tender with full range of motion and good stability and symmetric strength and tone of shoulders, elbows, wrist, hip, knee and ankles bilaterally.  Neck exam does have some mild loss of lordosis, patient does have tightness in the parascapular region right greater than left.  Tightness noted in the R scapula which is new.  Negative Spurling's.  Does have some limited left-sided sidebending and right-sided rotation  Osteopathic findings  C6 flexed rotated and side bent left T3 extended  rotated and side bent right inhaled third rib T5 extended rotated and side bent left L2 flexed rotated and side bent right Sacrum right on right    Impression and Recommendations:     The above documentation has been reviewed and is accurate and complete [name], DO  Note: This dictation was prepared with Dragon dictation along with smaller phrase technology. Any transcriptional errors that result from this process are unintentional.

## 2020-04-02 ENCOUNTER — Encounter: Payer: Self-pay | Admitting: Family Medicine

## 2020-04-02 ENCOUNTER — Ambulatory Visit: Payer: 59 | Admitting: Family Medicine

## 2020-04-02 ENCOUNTER — Other Ambulatory Visit: Payer: Self-pay

## 2020-04-02 VITALS — BP 116/72 | HR 64 | Ht 66.0 in | Wt 148.0 lb

## 2020-04-02 DIAGNOSIS — S134XXA Sprain of ligaments of cervical spine, initial encounter: Secondary | ICD-10-CM

## 2020-04-02 DIAGNOSIS — M999 Biomechanical lesion, unspecified: Secondary | ICD-10-CM | POA: Diagnosis not present

## 2020-04-02 DIAGNOSIS — M542 Cervicalgia: Secondary | ICD-10-CM | POA: Diagnosis not present

## 2020-04-02 NOTE — Assessment & Plan Note (Signed)
Patient initially did have some more whiplash syndrome but seems to be doing some improvement.  Scapular dyskinesis and poor posture is likely more the underlying factors at this time as well as some underlying stress with patient still having difficulty after her husband having a traumatic brain injury.  Patient is going to continue to try to be active.  Responding well to manipulation.  No change in medication and did discuss Effexor.  Follow-up again in 4 to 6 weeks

## 2020-04-02 NOTE — Patient Instructions (Signed)
Standing desk every 2 hours Vertical mouse See me in 7-8 weeks

## 2020-04-12 ENCOUNTER — Other Ambulatory Visit: Payer: Self-pay | Admitting: Family Medicine

## 2020-04-15 ENCOUNTER — Encounter: Payer: Self-pay | Admitting: Family Medicine

## 2020-04-15 DIAGNOSIS — F4323 Adjustment disorder with mixed anxiety and depressed mood: Secondary | ICD-10-CM | POA: Diagnosis not present

## 2020-04-16 MED FILL — VENLAFAXINE HCL ER 37.5 MG: 37.5 | 30 days supply | Qty: 30 | Fill #0

## 2020-04-22 MED FILL — FLUARIX QUADRIVALENT 0.5 ML: 0.5 | 1 days supply | Qty: 1 | Fill #0

## 2020-04-22 MED FILL — MONTELUKAST SOD 10 MG TAB: 10 | 90 days supply | Qty: 90 | Fill #1

## 2020-04-22 MED FILL — LEVOCETIRIZINE 5 MG TABLET: 5 | 30 days supply | Qty: 30 | Fill #2

## 2020-05-02 ENCOUNTER — Other Ambulatory Visit: Payer: Self-pay | Admitting: Family Medicine

## 2020-05-02 MED FILL — MELOXICAM 7.5 MG TABLET: 7.5 | 30 days supply | Qty: 30 | Fill #0

## 2020-05-06 ENCOUNTER — Ambulatory Visit: Payer: 59 | Admitting: Family Medicine

## 2020-05-17 ENCOUNTER — Other Ambulatory Visit: Payer: Self-pay | Admitting: Family Medicine

## 2020-05-17 ENCOUNTER — Other Ambulatory Visit (HOSPITAL_COMMUNITY): Payer: Self-pay | Admitting: Nurse Practitioner

## 2020-05-17 MED FILL — VENLAFAXINE HCL ER 37.5 MG: 37.5 | 90 days supply | Qty: 90 | Fill #0

## 2020-05-17 NOTE — Telephone Encounter (Signed)
Patient called following up on this refill request. 

## 2020-05-28 NOTE — Progress Notes (Signed)
[name] [place] Phone: [phone] Subjective:   [name], am serving as a scribe for Dr. Hulan Saas. This visit occurred during the SARS-CoV-2 public health emergency.  Safety protocols were in place, including screening questions prior to the visit, additional usage of staff PPE, and extensive cleaning of exam room while observing appropriate contact time as indicated for disinfecting solutions.   I'm seeing this patient by the request  of:  [name], [name], MD  CC: Neck and back pain follow-up  [id number]  Sabrina [name] is a 64 y.o. female coming in with complaint of back and neck pain. OMT 04/02/2020. Patient states that her neck pain is improving but not 100%. Also complaining of popping in right knee when ascending stairs. No pain associated with popping.   Medications patient has been prescribed: Gabapentin, Meloxicam, Effexor  Taking: Yes         Reviewed prior external information including notes and imaging from previsou exam, outside providers and external EMR if available.   As well as notes that were available from care everywhere and other healthcare systems.  Past medical history, social, surgical and family history all reviewed in electronic medical record.  No pertanent information unless stated regarding to the chief complaint.   Past Medical History:  Diagnosis Date  . Cancer ([place])    skin cancer  . Diverticulosis    CT  . Fibroids 09/07/2013  . PONV (postoperative nausea and vomiting)    non recent   . S/P hysterectomy with oophorectomy 09/07/2013  . Seasonal allergies   . SVD (spontaneous vaginal delivery)    x 1    No Known Allergies   Review of Systems:  No headache, visual changes, nausea, vomiting, diarrhea, constipation, dizziness, abdominal pain, skin rash, fevers, chills, night sweats, weight loss, swollen lymph nodes, body aches, joint swelling, chest pain,  shortness of breath, mood changes. POSITIVE muscle aches  Objective  Blood pressure 120/78, pulse 83, height 5\' 6"  (1.676 m), SpO2 99 %.   General: No apparent distress alert and oriented x3 mood and affect normal, dressed appropriately.  HEENT: Pupils equal, extraocular movements intact  Respiratory: Patient's speak in full sentences and does not appear short of breath  Cardiovascular: No lower extremity edema, non tender, no erythema  Neuro: Cranial nerves II through XII are intact, neurovascularly intact in all extremities with 2+ DTRs and 2+ pulses.  Gait normal with good balance and coordination.  MSK:  Non tender with full range of motion and good stability and symmetric strength and tone of shoulders, elbows, wrist, hip, and ankles bilaterally.  Knee: Right Normal to inspection with no erythema or effusion or obvious bony abnormalities. Palpation normal with no warmth, joint line tenderness, patellar tenderness, or condyle tenderness. ROM full in flexion and extension and lower leg rotation. Ligaments with solid consistent endpoints including ACL, PCL, LCL, MCL. Negative Mcmurray's, Apley's, and Thessalonian tests. Non painful patellar compression. Patellar glide without crepitus. Patellar and quadriceps tendons unremarkable. Hamstring and quadriceps strength is normal. Back -neck exam does have some very mild loss of lordosis still.  Lacks last 5 degrees of extension in the last 5 degrees of sidebending bilaterally.  Negative Spurling's.  Low back exam tightness in the thoracolumbar juncture noted.  Patient does have some mild tightness with Corky Sox on the right.  Negative straight leg test.  Osteopathic findings   C4 flexed rotated and side bent left T3 extended rotated and  side bent right inhaled rib T9 extended rotated and side bent left L2 flexed rotated and side bent left  Sacrum right on right       Assessment and Plan:  Whiplash injury syndrome, initial  encounter Patient is doing significantly better at this time.  Making progress.  We discussed the Effexor and continuing that medication at this moment.  Responds well to osteopathic manipulation.  Follow-up again in 6 to 8 weeks    Nonallopathic problems  Decision today to treat with OMT was based on Physical Exam  After verbal consent patient was treated with HVLA, ME, FPR techniques in cervical, rib, thoracic, lumbar, and sacral  areas  Patient tolerated the procedure well with improvement in symptoms  Patient given exercises, stretches and lifestyle modifications  See medications in patient instructions if given  Patient will follow up in 4-8 weeks      The above documentation has been reviewed and is accurate and complete [name], DO       Note: This dictation was prepared with Dragon dictation along with smaller phrase technology. Any transcriptional errors that result from this process are unintentional.

## 2020-05-29 ENCOUNTER — Other Ambulatory Visit: Payer: Self-pay

## 2020-05-29 ENCOUNTER — Ambulatory Visit: Payer: 59 | Admitting: Family Medicine

## 2020-05-29 ENCOUNTER — Encounter: Payer: Self-pay | Admitting: Family Medicine

## 2020-05-29 VITALS — BP 120/78 | HR 83 | Ht 66.0 in

## 2020-05-29 DIAGNOSIS — M999 Biomechanical lesion, unspecified: Secondary | ICD-10-CM | POA: Diagnosis not present

## 2020-05-29 DIAGNOSIS — S134XXA Sprain of ligaments of cervical spine, initial encounter: Secondary | ICD-10-CM | POA: Diagnosis not present

## 2020-05-29 NOTE — Assessment & Plan Note (Signed)
Patient is doing significantly better at this time.  Making progress.  We discussed the Effexor and continuing that medication at this moment.  Responds well to osteopathic manipulation.  Follow-up again in 6 to 8 weeks

## 2020-05-29 NOTE — Patient Instructions (Signed)
Good to see you See me again in 6-8 weeks 

## 2020-05-30 MED FILL — LEVOCETIRIZINE 5 MG TABLET: 5 | 30 days supply | Qty: 30 | Fill #3

## 2020-06-05 DIAGNOSIS — F4323 Adjustment disorder with mixed anxiety and depressed mood: Secondary | ICD-10-CM | POA: Diagnosis not present

## 2020-06-11 DIAGNOSIS — Z0000 Encounter for general adult medical examination without abnormal findings: Secondary | ICD-10-CM | POA: Diagnosis not present

## 2020-06-11 DIAGNOSIS — E785 Hyperlipidemia, unspecified: Secondary | ICD-10-CM | POA: Diagnosis not present

## 2020-06-17 ENCOUNTER — Other Ambulatory Visit: Payer: Self-pay | Admitting: Family Medicine

## 2020-06-17 MED FILL — ESTRADIOL PATCH 0.0375: 0.0375 | 84 days supply | Qty: 24 | Fill #3

## 2020-06-17 MED FILL — MELOXICAM 7.5 MG TABLET: 7.5 | 30 days supply | Qty: 30 | Fill #0

## 2020-06-18 DIAGNOSIS — Z800 Family history of malignant neoplasm of digestive organs: Secondary | ICD-10-CM | POA: Diagnosis not present

## 2020-06-18 DIAGNOSIS — M5380 Other specified dorsopathies, site unspecified: Secondary | ICD-10-CM | POA: Diagnosis not present

## 2020-06-18 DIAGNOSIS — N393 Stress incontinence (female) (male): Secondary | ICD-10-CM | POA: Diagnosis not present

## 2020-06-18 DIAGNOSIS — Z0000 Encounter for general adult medical examination without abnormal findings: Secondary | ICD-10-CM | POA: Diagnosis not present

## 2020-06-18 DIAGNOSIS — K5790 Diverticulosis of intestine, part unspecified, without perforation or abscess without bleeding: Secondary | ICD-10-CM | POA: Diagnosis not present

## 2020-06-18 DIAGNOSIS — R82998 Other abnormal findings in urine: Secondary | ICD-10-CM | POA: Diagnosis not present

## 2020-06-18 DIAGNOSIS — D490 Neoplasm of unspecified behavior of digestive system: Secondary | ICD-10-CM | POA: Diagnosis not present

## 2020-06-18 DIAGNOSIS — M1990 Unspecified osteoarthritis, unspecified site: Secondary | ICD-10-CM | POA: Diagnosis not present

## 2020-06-18 DIAGNOSIS — E785 Hyperlipidemia, unspecified: Secondary | ICD-10-CM | POA: Diagnosis not present

## 2020-06-18 DIAGNOSIS — J309 Allergic rhinitis, unspecified: Secondary | ICD-10-CM | POA: Diagnosis not present

## 2020-06-19 DIAGNOSIS — F4323 Adjustment disorder with mixed anxiety and depressed mood: Secondary | ICD-10-CM | POA: Diagnosis not present

## 2020-07-05 DIAGNOSIS — Z1212 Encounter for screening for malignant neoplasm of rectum: Secondary | ICD-10-CM | POA: Diagnosis not present

## 2020-07-05 MED FILL — LEVOCETIRIZINE 5 MG TABLET: 5 | 30 days supply | Qty: 30 | Fill #4

## 2020-07-05 MED FILL — IPRATROPIUM 0.03% SPRAY: 0.03 | 30 days supply | Qty: 30 | Fill #2

## 2020-07-11 ENCOUNTER — Ambulatory Visit: Payer: 59 | Admitting: Family Medicine

## 2020-07-11 ENCOUNTER — Ambulatory Visit: Payer: Self-pay

## 2020-07-11 ENCOUNTER — Other Ambulatory Visit: Payer: Self-pay

## 2020-07-11 ENCOUNTER — Encounter: Payer: Self-pay | Admitting: Family Medicine

## 2020-07-11 VITALS — BP 122/72 | HR 74 | Ht 66.0 in

## 2020-07-11 DIAGNOSIS — F4323 Adjustment disorder with mixed anxiety and depressed mood: Secondary | ICD-10-CM | POA: Diagnosis not present

## 2020-07-11 DIAGNOSIS — S838X1A Sprain of other specified parts of right knee, initial encounter: Secondary | ICD-10-CM | POA: Diagnosis not present

## 2020-07-11 DIAGNOSIS — G8929 Other chronic pain: Secondary | ICD-10-CM

## 2020-07-11 DIAGNOSIS — M545 Low back pain, unspecified: Secondary | ICD-10-CM

## 2020-07-11 DIAGNOSIS — M25561 Pain in right knee: Secondary | ICD-10-CM

## 2020-07-11 DIAGNOSIS — M999 Biomechanical lesion, unspecified: Secondary | ICD-10-CM | POA: Diagnosis not present

## 2020-07-11 NOTE — Assessment & Plan Note (Signed)
Patient having pain on the lateral aspect of the knee.  On ultrasound today, nothing was found specifically.  We will continue to monitor.  If any locking or giving out possible advanced imaging would be warranted or consider the possibility of injection.

## 2020-07-11 NOTE — Assessment & Plan Note (Signed)
Chronic problem with mild exacerbation.  Patient does have the meloxicam as needed as well as the gabapentin.  May need that on a more regular basis.  Does respond fairly well to osteopathic manipulation.  Patient does have the Effexor as well we have discussed previously.  Follow-up again in 6 weeks

## 2020-07-11 NOTE — Progress Notes (Signed)
[place] Phone: [phone] Subjective:   [name], am serving as a scribe for Dr. Hulan Saas. This visit occurred during the SARS-CoV-2 public health emergency.  Safety protocols were in place, including screening questions prior to the visit, additional usage of staff PPE, and extensive cleaning of exam room while observing appropriate contact time as indicated for disinfecting solutions.   I'm seeing this patient by the request  of:  [name], [name], MD  CC: Knee pain, low back pain follow-up  [id number]  [name] is a 64 y.o. female coming in with complaint of back and neck pain. OMT 05/29/2020. Patient states that she continues to have stiffness in her neck.  Mild low back pain as well.  Trying to be active but finds it difficult sometimes.  Still not stopping her though from any true activities.  Also complaining of right knee pain. Pain over lateral aspect of knee. Pain with deep knee flexion. Active 6 days a week on [employer].  Patient states this was on the anterior lateral aspect of the knee.  [name] locking or giving out on her otherwise.  Various sensitive to even light palpation she states  Medications patient has been prescribed: Meloxicam, Effexor  Taking: Intermittently         Reviewed prior external information including notes and imaging from previsou exam, outside providers and external EMR if available.   As well as notes that were available from care everywhere and other healthcare systems.  Past medical history, social, surgical and family history all reviewed in electronic medical record.  [name] pertanent information unless stated regarding to the chief complaint.   Past Medical History:  Diagnosis Date  . Cancer ([place])    skin cancer  . Diverticulosis    CT  . Fibroids 09/07/2013  . PONV (postoperative nausea and vomiting)    non recent   . S/P hysterectomy with  oophorectomy 09/07/2013  . Seasonal allergies   . SVD (spontaneous vaginal delivery)    x 1    [name] Known Allergies   Review of Systems:  [name] headache, visual changes, nausea, vomiting, diarrhea, constipation, dizziness, abdominal pain, skin rash, fevers, chills, night sweats, weight loss, swollen lymph nodes, body aches, joint swelling, chest pain, shortness of breath, mood changes. POSITIVE muscle aches  Objective  Blood pressure 122/72, pulse 74, height 5\' 6"  (1.676 m), SpO2 98 %.   General: [name] apparent distress alert and oriented x3 mood and affect normal, dressed appropriately.  HEENT: Pupils equal, extraocular movements intact  Respiratory: Patient's speak in full sentences and does not appear short of breath  Cardiovascular: [name] lower extremity edema, non tender, [name] erythema  Neuro: Cranial nerves II through XII are intact, neurovascularly intact in all extremities with 2+ DTRs and 2+ pulses.  Gait normal with good balance and coordination.  Right knee exam shows the patient is minorly tender to palpation in the anterior lateral joint space.  Patient has [name] tightness of the hamstring.  Very mild positive McMurray's.  [name] locking or giving out on either.  5 out of 5 strength and neurovascular intact distally  Low back exam does have some tightness noted.  More pain in the thoracolumbar juncture.  All in the paraspinal musculature.  Tightness of the neck with some mild loss of sidebending bilaterally   Limited musculoskeletal ultrasound was performed and interpreted by [name]  Limited ultrasound of patient's right knee shows [name]  significant findings other than the degenerative lateral meniscal tear that has been previous.  [name] significant displacement.  [name] swelling noted.  [name] abnormality of the bony prominence on the lateral aspect of the knee.   Osteopathic findings  C7 flexed rotated and side bent left T5 extended rotated and side bent right inhaled rib T8 extended rotated and  side bent left L1 flexed rotated and side bent right Sacrum right on right       Assessment and Plan:  Acute lateral meniscal injury of the knee, right, initial encounter Patient having pain on the lateral aspect of the knee.  On ultrasound today, nothing was found specifically.  We will continue to monitor.  If any locking or giving out possible advanced imaging would be warranted or consider the possibility of injection.  Low back pain Chronic problem with mild exacerbation.  Patient does have the meloxicam as needed as well as the gabapentin.  May need that on a more regular basis.  Does respond fairly well to osteopathic manipulation.  Patient does have the Effexor as well we have discussed previously.  Follow-up again in 6 weeks    Nonallopathic problems  Decision today to treat with OMT was based on Physical Exam  After verbal consent patient was treated with HVLA, ME, FPR techniques in cervical, rib, thoracic, lumbar, and sacral  areas  Patient tolerated the procedure well with improvement in symptoms  Patient given exercises, stretches and lifestyle modifications  See medications in patient instructions if given  Patient will follow up in 4-8 weeks      The above documentation has been reviewed and is accurate and complete [name], DO       Note: This dictation was prepared with Dragon dictation along with smaller phrase technology. Any transcriptional errors that result from this process are unintentional.

## 2020-07-11 NOTE — Patient Instructions (Signed)
Good to see you Sorry for being late Continue everything else See me again in 6-8 weeks

## 2020-07-24 DIAGNOSIS — Z419 Encounter for procedure for purposes other than remedying health state, unspecified: Secondary | ICD-10-CM | POA: Diagnosis not present

## 2020-08-06 ENCOUNTER — Other Ambulatory Visit (HOSPITAL_COMMUNITY): Payer: Self-pay | Admitting: Ophthalmology

## 2020-08-06 MED FILL — MONTELUKAST SOD 10 MG TAB: 10 | 90 days supply | Qty: 90 | Fill #0

## 2020-08-06 MED FILL — LEVOCETIRIZINE 5 MG TABLET: 5 | 30 days supply | Qty: 30 | Fill #5

## 2020-08-06 MED FILL — RESTASIS 0.05% EYE EMULSION: 0.05 | 30 days supply | Qty: 60 | Fill #0

## 2020-08-13 ENCOUNTER — Other Ambulatory Visit (HOSPITAL_COMMUNITY): Payer: Self-pay | Admitting: Family Medicine

## 2020-08-13 DIAGNOSIS — R059 Cough, unspecified: Secondary | ICD-10-CM | POA: Diagnosis not present

## 2020-08-13 DIAGNOSIS — J309 Allergic rhinitis, unspecified: Secondary | ICD-10-CM | POA: Diagnosis not present

## 2020-08-13 DIAGNOSIS — Z1152 Encounter for screening for COVID-19: Secondary | ICD-10-CM | POA: Diagnosis not present

## 2020-08-13 MED FILL — AZITHROMYCIN 250 MG TABLET: 250 | 5 days supply | Qty: 6 | Fill #0

## 2020-08-13 MED FILL — PROMETHAZINE W/DM SYRUP: 6.25-15 | 10 days supply | Qty: 200 | Fill #0

## 2020-08-13 MED FILL — IPRATROPIUM 0.03% SPRAY: 0.03 | 43 days supply | Qty: 30 | Fill #0

## 2020-08-14 ENCOUNTER — Other Ambulatory Visit: Payer: Self-pay

## 2020-08-14 ENCOUNTER — Encounter: Payer: Self-pay | Admitting: Family Medicine

## 2020-08-14 MED ORDER — VENLAFAXINE HCL ER 37.5 MG PO CP24: 37.5000 mg | ORAL_CAPSULE | Freq: Every day | ORAL | 0 refills | Status: DC

## 2020-08-14 MED FILL — VENLAFAXINE HCL ER 37.5 MG: 37.5 | 30 days supply | Qty: 30 | Fill #0

## 2020-09-02 ENCOUNTER — Other Ambulatory Visit: Payer: Self-pay | Admitting: Family Medicine

## 2020-09-02 MED FILL — MELOXICAM 7.5 MG TABLET: 7.5 | 90 days supply | Qty: 90 | Fill #0

## 2020-09-02 MED FILL — LEVOCETIRIZINE 5 MG TABLET: 5 | 30 days supply | Qty: 30 | Fill #0

## 2020-09-02 NOTE — Telephone Encounter (Signed)
Refill done.  

## 2020-09-04 NOTE — Progress Notes (Signed)
[name] [name] Phone: [phone] Subjective:   [name], am serving as a scribe for Dr. Hulan Saas. This visit occurred during the SARS-CoV-2 public health emergency.  Safety protocols were in place, including screening questions prior to the visit, additional usage of staff PPE, and extensive cleaning of exam room while observing appropriate contact time as indicated for disinfecting solutions.   I'm seeing this patient by the request  of:  Sabrina, [name], MD  CC: Back and neck pain follow-up  [id number]   07/11/2020 Patient having pain on the lateral aspect of the knee.  On ultrasound today, nothing was found specifically.  We will continue to monitor.  If any locking or giving out possible advanced imaging would be warranted or consider the possibility of injection.   Update 09/05/2020 [name] is a 65 y.o. female coming in with complaint of back and neck pain. OMT 07/11/2020 and right knee pain.  Patient states that she is having stiffness in back.  Patient denies of any radiation down the legs.  With some mild pain on the right side of the hip.  Would like to reduce Effexor. Feels like she has been doing well.  Patient did not feel that it helped with some of the anxiety but also help with some of the pain that would like to come off of it if possible.  Medications patient has been prescribed: Effexor  Taking: Yes         Reviewed prior external information including notes and imaging from previsou exam, outside providers and external EMR if available.   As well as notes that were available from care everywhere and other healthcare systems.  Past medical history, social, surgical and family history all reviewed in electronic medical record.  No pertanent information unless stated regarding to the chief complaint.   Past Medical History:  Diagnosis Date  . Cancer ([place])    skin cancer  .  Diverticulosis    CT  . Fibroids 09/07/2013  . PONV (postoperative nausea and vomiting)    non recent   . S/P hysterectomy with oophorectomy 09/07/2013  . Seasonal allergies   . SVD (spontaneous vaginal delivery)    x 1    No Known Allergies   Review of Systems:  No headache, visual changes, nausea, vomiting, diarrhea, constipation, dizziness, abdominal pain, skin rash, fevers, chills, night sweats, weight loss, swollen lymph nodes, body aches, joint swelling, chest pain, shortness of breath, mood changes. POSITIVE muscle aches  Objective  Blood pressure 122/82, pulse 66, height 5\' 6"  (1.676 m), weight 148 lb (67.1 kg), SpO2 99 %.   General: No apparent distress alert and oriented x3 mood and affect normal, dressed appropriately.  HEENT: Pupils equal, extraocular movements intact  Respiratory: Patient's speak in full sentences and does not appear short of breath  Cardiovascular: No lower extremity edema, non tender, no erythema  Gait normal with good balance and coordination.  MSK:  Back -low back exam does have some tightness noted.  Tender to palpation of the paraspinal musculature of the lumbar spine right greater than left.  Tightness around the right sacroiliac joint.  Does have some tenderness over the right greater trochanteric area as well.  Osteopathic findings  C5 flexed rotated and side bent right T3 extended rotated and side bent right inhaled ribt L4 flexed rotated and side bent right Sacrum right on right       Assessment and Plan:  Greater trochanteric bursitis of right hip History of bursitis and is having some mild recurrent.  Worsening will consider potential injection.  Can discuss at follow-up  Low back pain Chronic low back pain.  Mild exacerbation.  Patient now is doing relatively well when it comes to how active she has been.  Patient was encouraged to continue to work on the weight.  Patient wants to get off the Effexor decrease her to a total of 25 mg  a day, 37.5 mg.  We will see how patient responds.  We discussed also the potential for change of BuSpar if necessary.  Patient will follow up with me again 4 weeks and will make sure she is doing well.    Nonallopathic problems  Decision today to treat with OMT was based on Physical Exam  After verbal consent patient was treated with HVLA, ME, FPR techniques in cervical, rib, thoracic, lumbar, and sacral  areas  Patient tolerated the procedure well with improvement in symptoms  Patient given exercises, stretches and lifestyle modifications  See medications in patient instructions if given  Patient will follow up in 4-8 weeks      The above documentation has been reviewed and is accurate and complete [name], DO       Note: This dictation was prepared with Dragon dictation along with smaller phrase technology. Any transcriptional errors that result from this process are unintentional.

## 2020-09-05 ENCOUNTER — Encounter: Payer: Self-pay | Admitting: Family Medicine

## 2020-09-05 ENCOUNTER — Other Ambulatory Visit: Payer: Self-pay

## 2020-09-05 ENCOUNTER — Other Ambulatory Visit: Payer: Self-pay | Admitting: Family Medicine

## 2020-09-05 ENCOUNTER — Ambulatory Visit: Payer: 59 | Admitting: Family Medicine

## 2020-09-05 VITALS — BP 122/82 | HR 66 | Ht 66.0 in | Wt 148.0 lb

## 2020-09-05 DIAGNOSIS — M999 Biomechanical lesion, unspecified: Secondary | ICD-10-CM | POA: Diagnosis not present

## 2020-09-05 DIAGNOSIS — M545 Low back pain, unspecified: Secondary | ICD-10-CM

## 2020-09-05 DIAGNOSIS — G8929 Other chronic pain: Secondary | ICD-10-CM | POA: Diagnosis not present

## 2020-09-05 DIAGNOSIS — M7061 Trochanteric bursitis, right hip: Secondary | ICD-10-CM

## 2020-09-05 MED ORDER — VENLAFAXINE HCL 25 MG PO TABS: 12.5000 mg | ORAL_TABLET | Freq: Two times a day (BID) | ORAL | 1 refills | Status: DC

## 2020-09-05 MED FILL — VENLAFAXINE HCL 25 MG TAB: 25 | 30 days supply | Qty: 30 | Fill #0

## 2020-09-05 NOTE — Assessment & Plan Note (Signed)
History of bursitis and is having some mild recurrent.  Worsening will consider potential injection.  Can discuss at follow-up

## 2020-09-05 NOTE — Patient Instructions (Signed)
Try to use Effexor 12.5mg  2x a day for 2 weeks Then 12.5 mg for one week then try to stop  You know everything else See me in 4-6 weeks

## 2020-09-05 NOTE — Assessment & Plan Note (Signed)
Chronic low back pain.  Mild exacerbation.  Patient now is doing relatively well when it comes to how active she has been.  Patient was encouraged to continue to work on the weight.  Patient wants to get off the Effexor decrease her to a total of 25 mg a day, 37.5 mg.  We will see how patient responds.  We discussed also the potential for change of BuSpar if necessary.  Patient will follow up with me again 4 weeks and will make sure she is doing well.

## 2020-09-11 ENCOUNTER — Other Ambulatory Visit (HOSPITAL_COMMUNITY): Payer: Self-pay | Admitting: Obstetrics & Gynecology

## 2020-09-11 DIAGNOSIS — L570 Actinic keratosis: Secondary | ICD-10-CM | POA: Diagnosis not present

## 2020-09-11 DIAGNOSIS — I788 Other diseases of capillaries: Secondary | ICD-10-CM | POA: Diagnosis not present

## 2020-09-11 DIAGNOSIS — L814 Other melanin hyperpigmentation: Secondary | ICD-10-CM | POA: Diagnosis not present

## 2020-09-11 DIAGNOSIS — L700 Acne vulgaris: Secondary | ICD-10-CM | POA: Diagnosis not present

## 2020-09-11 MED FILL — ESTRADIOL PATCH 0.0375: 0.0375 | 84 days supply | Qty: 24 | Fill #0

## 2020-09-11 MED FILL — AZELAIC ACID 15 % GEL: 15 | 50 days supply | Qty: 50 | Fill #0

## 2020-09-17 MED FILL — TRETINOIN 0.05 % CREA: 0.05 | 20 days supply | Qty: 20 | Fill #0

## 2020-09-27 ENCOUNTER — Other Ambulatory Visit (HOSPITAL_COMMUNITY): Payer: Self-pay | Admitting: Allergy and Immunology

## 2020-09-27 DIAGNOSIS — H1045 Other chronic allergic conjunctivitis: Secondary | ICD-10-CM | POA: Diagnosis not present

## 2020-09-27 DIAGNOSIS — J300 Vasomotor rhinitis: Secondary | ICD-10-CM | POA: Diagnosis not present

## 2020-09-27 DIAGNOSIS — K219 Gastro-esophageal reflux disease without esophagitis: Secondary | ICD-10-CM | POA: Diagnosis not present

## 2020-09-27 DIAGNOSIS — J3089 Other allergic rhinitis: Secondary | ICD-10-CM | POA: Diagnosis not present

## 2020-09-28 MED FILL — IPRATROPIUM 0.06% SPRAY: 0.06 | 21 days supply | Qty: 15 | Fill #0

## 2020-10-01 MED FILL — LEVOCETIRIZINE 5 MG TABLET: 5 | 30 days supply | Qty: 30 | Fill #1

## 2020-10-10 ENCOUNTER — Other Ambulatory Visit (HOSPITAL_COMMUNITY): Payer: Self-pay | Admitting: Obstetrics & Gynecology

## 2020-10-10 DIAGNOSIS — Z79890 Hormone replacement therapy (postmenopausal): Secondary | ICD-10-CM | POA: Diagnosis not present

## 2020-10-10 DIAGNOSIS — Z01419 Encounter for gynecological examination (general) (routine) without abnormal findings: Secondary | ICD-10-CM | POA: Diagnosis not present

## 2020-10-10 DIAGNOSIS — Z113 Encounter for screening for infections with a predominantly sexual mode of transmission: Secondary | ICD-10-CM | POA: Diagnosis not present

## 2020-10-10 DIAGNOSIS — Z01411 Encounter for gynecological examination (general) (routine) with abnormal findings: Secondary | ICD-10-CM | POA: Diagnosis not present

## 2020-10-10 DIAGNOSIS — Z6828 Body mass index (BMI) 28.0-28.9, adult: Secondary | ICD-10-CM | POA: Diagnosis not present

## 2020-10-10 DIAGNOSIS — Z124 Encounter for screening for malignant neoplasm of cervix: Secondary | ICD-10-CM | POA: Diagnosis not present

## 2020-10-10 DIAGNOSIS — N951 Menopausal and female climacteric states: Secondary | ICD-10-CM | POA: Diagnosis not present

## 2020-10-10 MED FILL — ESTRADIOL 0.025 MG/24HR PTT: 0.025 | 84 days supply | Qty: 24 | Fill #0

## 2020-10-11 ENCOUNTER — Other Ambulatory Visit (HOSPITAL_COMMUNITY): Payer: Self-pay | Admitting: Obstetrics & Gynecology

## 2020-10-16 ENCOUNTER — Other Ambulatory Visit (HOSPITAL_COMMUNITY): Payer: Self-pay | Admitting: Obstetrics & Gynecology

## 2020-10-16 NOTE — Progress Notes (Signed)
[place] Phone: [phone] Subjective:    [name], am serving as a scribe for Dr. Hulan Saas. This visit occurred during the SARS-CoV-2 public health emergency.  Safety protocols were in place, including screening questions prior to the visit, additional usage of staff PPE, and extensive cleaning of exam room while observing appropriate contact time as indicated for disinfecting solutions.   I'm seeing this patient by the request  of:  [name], [name], MD  CC: Bilateral hip pain and back pain  [id number]  [name] is a 65 y.o. female coming in with complaint of back and neck pain. OMT 09/05/2020. Patient states that she has gained weight recently. Pain over GT bilaterally. Tender when lying on side and notices some pain with cycling.   DEXA scan on Tuesday.   Medications patient has been prescribed: Meloxicam  Taking: Intermittently patient has discontinued the Effexor         Reviewed prior external information including notes and imaging from previsou exam, outside providers and external EMR if available.   As well as notes that were available from care everywhere and other healthcare systems.  Past medical history, social, surgical and family history all reviewed in electronic medical record.  No pertanent information unless stated regarding to the chief complaint.   Past Medical History:  Diagnosis Date   Cancer ([place])    skin cancer   Diverticulosis    CT   Fibroids 09/07/2013   PONV (postoperative nausea and vomiting)    non recent    S/P hysterectomy with oophorectomy 09/07/2013   Seasonal allergies    SVD (spontaneous vaginal delivery)    x 1    No Known Allergies   Review of Systems:  No headache, visual changes, nausea, vomiting, diarrhea, constipation, dizziness, abdominal pain, skin rash, fevers, chills, night sweats, weight loss, swollen lymph nodes,  joint  swelling, chest pain, shortness of breath, mood changes. POSITIVE muscle aches, body aches, weight gain  Objective  Blood pressure 112/80, height 5\' 6"  (1.676 m), weight 164 lb (74.4 kg).   General: No apparent distress alert and oriented x3 mood and affect normal, dressed appropriately.  HEENT: Pupils equal, extraocular movements intact  Respiratory: Patient's speak in full sentences and does not appear short of breath  Cardiovascular: No lower extremity edema, non tender, no erythema  Neuro: Cranial nerves II through XII are intact, neurovascularly intact in all extremities with 2+ DTRs and 2+ pulses.  Gait normal with good balance and coordination.  MSK:  Non tender with full range of motion and good stability and symmetric strength and tone of shoulders, elbows, wrist, hip, knee and ankles bilaterally.  Back -low back exam does have some mild loss of lordosis.  Some tenderness to palpation in the paraspinal musculature of the lumbar spine.  Severe tenderness over the greater trochanteric area bilaterally.  Positive FABER test.  Neck exam mild tightness but no true radicular symptoms.  Osteopathic findings  C7 flexed rotated and side bent right T3 extended rotated and side bent right inhaled rib T9 extended rotated and side bent left L2 flexed rotated and side bent right Sacrum right on right  After verbal consent patient was prepped with alcohol swab and with a 21-gauge 2 inch needle injected with 0.5 cc of 0.5% Marcaine and 1 cc of Kenalog 40 mg/mL into the right greater trochanteric area.  No blood loss.  Band-Aid placed.  Postinjection instructions given.  After verbal consent patient was prepped with alcohol swabs and with a 21-gauge 2 inch needle injected into the left greater trochanteric area with a total of 1 cc of 0.5% Marcaine and 1 cc of Kenalog 40 mg/mL.  No blood loss.  Band-Aid placed.  Postinjection instructions given     Assessment and Plan:  Greater trochanteric  bursitis of both hips Bilateral injections given today.  Tolerated the procedure well, discussed icing regimen and home exercises.  Discussed topical anti-inflammatories.  Patient is encouraged to continue to work on the weight loss.  Patient will follow up with me again in 4 to 8 weeks.  If continuing to have aches and pains will consider possible laboratory work-up    Nonallopathic problems  Decision today to treat with OMT was based on Physical Exam  After verbal consent patient was treated with HVLA, ME, FPR techniques in cervical, rib, thoracic, lumbar, and sacral  areas  Patient tolerated the procedure well with improvement in symptoms  Patient given exercises, stretches and lifestyle modifications  See medications in patient instructions if given  Patient will follow up in 4-8 weeks      The above documentation has been reviewed and is accurate and complete [name], DO       Note: This dictation was prepared with Dragon dictation along with smaller phrase technology. Any transcriptional errors that result from this process are unintentional.

## 2020-10-17 ENCOUNTER — Encounter: Payer: Self-pay | Admitting: Family Medicine

## 2020-10-17 ENCOUNTER — Other Ambulatory Visit: Payer: Self-pay

## 2020-10-17 ENCOUNTER — Ambulatory Visit: Payer: 59 | Admitting: Family Medicine

## 2020-10-17 VITALS — BP 112/80 | Ht 66.0 in | Wt 164.0 lb

## 2020-10-17 DIAGNOSIS — M7061 Trochanteric bursitis, right hip: Secondary | ICD-10-CM

## 2020-10-17 DIAGNOSIS — M7062 Trochanteric bursitis, left hip: Secondary | ICD-10-CM

## 2020-10-17 DIAGNOSIS — M999 Biomechanical lesion, unspecified: Secondary | ICD-10-CM

## 2020-10-17 NOTE — Assessment & Plan Note (Signed)
Bilateral injections given today.  Tolerated the procedure well, discussed icing regimen and home exercises.  Discussed topical anti-inflammatories.  Patient is encouraged to continue to work on the weight loss.  Patient will follow up with me again in 4 to 8 weeks.  If continuing to have aches and pains will consider possible laboratory work-up

## 2020-10-17 NOTE — Patient Instructions (Addendum)
Good to see you Hip injections today Exercise 3 times a week   Access your exercises here View at [email or web address] using code: [id number] Pennsaid 2 times a day small amount over most painful spot Ice 20 mins 2 times a day See me again in 6-7 weeks

## 2020-10-21 DIAGNOSIS — H524 Presbyopia: Secondary | ICD-10-CM | POA: Diagnosis not present

## 2020-10-21 DIAGNOSIS — Z961 Presence of intraocular lens: Secondary | ICD-10-CM | POA: Diagnosis not present

## 2020-10-22 ENCOUNTER — Encounter: Payer: Self-pay | Admitting: Gastroenterology

## 2020-10-24 DIAGNOSIS — Z13820 Encounter for screening for osteoporosis: Secondary | ICD-10-CM | POA: Diagnosis not present

## 2020-11-08 ENCOUNTER — Other Ambulatory Visit (HOSPITAL_COMMUNITY): Payer: Self-pay

## 2020-11-08 MED ORDER — LEVOCETIRIZINE DIHYDROCHLORIDE 5 MG PO TABS
5.0000 mg | ORAL_TABLET | Freq: Every day | ORAL | 3 refills | Status: DC
  Filled 2020-11-08: qty 30, 30d supply, fill #0
  Filled 2020-12-06: qty 30, 30d supply, fill #1
  Filled 2021-01-06: qty 30, 30d supply, fill #2
  Filled 2021-02-07: qty 30, 30d supply, fill #0

## 2020-11-12 ENCOUNTER — Other Ambulatory Visit (HOSPITAL_COMMUNITY): Payer: Self-pay

## 2020-11-14 ENCOUNTER — Other Ambulatory Visit (HOSPITAL_COMMUNITY): Payer: Self-pay

## 2020-11-14 MED ORDER — MONTELUKAST SODIUM 10 MG PO TABS
10.0000 mg | ORAL_TABLET | Freq: Every day | ORAL | 2 refills | Status: DC
  Filled 2020-11-14 – 2021-02-07 (×2): qty 90, 90d supply, fill #0
  Filled 2021-05-15: qty 90, 90d supply, fill #1

## 2020-11-18 ENCOUNTER — Other Ambulatory Visit (HOSPITAL_BASED_OUTPATIENT_CLINIC_OR_DEPARTMENT_OTHER): Payer: Self-pay

## 2020-11-19 ENCOUNTER — Ambulatory Visit: Payer: 59 | Attending: Internal Medicine

## 2020-11-19 ENCOUNTER — Other Ambulatory Visit: Payer: Self-pay

## 2020-11-19 ENCOUNTER — Other Ambulatory Visit (HOSPITAL_BASED_OUTPATIENT_CLINIC_OR_DEPARTMENT_OTHER): Payer: Self-pay

## 2020-11-19 DIAGNOSIS — Z23 Encounter for immunization: Secondary | ICD-10-CM

## 2020-11-19 MED ORDER — PFIZER-BIONT COVID-19 VAC-TRIS 30 MCG/0.3ML IM SUSP
INTRAMUSCULAR | 0 refills | Status: DC
  Filled 2020-11-19: qty 0.3, 1d supply, fill #0

## 2020-11-19 NOTE — Progress Notes (Signed)
   Covid-19 Vaccination Clinic  Name:  [name]    MRN: [id number] DOB: Jun 24, 1956  11/19/2020  [name] was observed post Covid-19 immunization for 15 minutes without incident. She was provided with Vaccine Information Sheet and instruction to access the V-Safe system.   [name] was instructed to call 911 with any severe reactions post vaccine: Marland Kitchen Difficulty breathing  . Swelling of face and throat  . A fast heartbeat  . A bad rash all over body  . Dizziness and weakness   Immunizations Administered    Name Date Dose VIS Date Route   PFIZER Comrnaty(Gray TOP) Covid-19 Vaccine 11/19/2020  8:51 AM 0.3 mL 07/11/2020 Intramuscular   Manufacturer: [employer], [employer]   Lot: [id number]   NDC: [phone]

## 2020-11-24 NOTE — Progress Notes (Signed)
[place] Phone: [phone] Subjective:   [name], am serving as a scribe for Dr. Hulan Saas. This visit occurred during the SARS-CoV-2 public health emergency.  Safety protocols were in place, including screening questions prior to the visit, additional usage of staff PPE, and extensive cleaning of exam room while observing appropriate contact time as indicated for disinfecting solutions.   I'm seeing this patient by the request  of:  [name], [name], MD  CC: back and hip pain follow up   [id number]  [name] is a 65 y.o. female coming in with complaint of back and neck pain as well as B GT bursitis. OMT 10/17/2020. Patient states that she tweeked her back after doing a leg press machine at [employer]. Used meloxicam and rest to treat her pain.   R side hip pain work patient up on Saturday night in pain. [name] more activity during that day.   Medications patient has been prescribed: Meloxicam  Taking: Intermittently         Reviewed prior external information including notes and imaging from previsou exam, outside providers and external EMR if available.   As well as notes that were available from care everywhere and other healthcare systems.  Past medical history, social, surgical and family history all reviewed in electronic medical record.  [name] pertanent information unless stated regarding to the chief complaint.   Past Medical History:  Diagnosis Date  . Cancer ([place])    skin cancer  . Diverticulosis    CT  . Fibroids 09/07/2013  . PONV (postoperative nausea and vomiting)    non recent   . S/P hysterectomy with oophorectomy 09/07/2013  . Seasonal allergies   . SVD (spontaneous vaginal delivery)    x 1    [name] Known Allergies   Review of Systems:  [name] headache, visual changes, nausea, vomiting, diarrhea, constipation, dizziness, abdominal pain, skin rash, fevers, chills, night  sweats, weight loss, swollen lymph nodes, body aches, joint swelling, chest pain, shortness of breath, mood changes. POSITIVE muscle aches  Objective  Blood pressure 120/76, pulse 61, height 5\' 6"  (1.676 m), weight 163 lb (73.9 kg), SpO2 97 %.   General: [name] apparent distress alert and oriented x3 mood and affect normal, dressed appropriately.  HEENT: Pupils equal, extraocular movements intact  Respiratory: Patient's speak in full sentences and does not appear short of breath  Cardiovascular: [name] lower extremity edema, non tender, [name] erythema  Gait normal with good balance and coordination.  MSK:  Non tender with full range of motion and good stability and symmetric strength and tone of shoulders, elbows, wrist, hip, knee and ankles bilaterally.  Back -low back exam does have some tenderness to palpation of the paraspinal musculature.  Patient does have some tenderness more over the right sacroiliac joint and the paraspinal musculature on the right side.  Osteopathic findings  C3 flexed rotated and side bent right T6 extended rotated and side bent right inhaled rib T9 extended rotated and side bent left L2 flexed rotated and side bent right Sacrum right on right     Assessment and Plan:   Greater trochanteric bursitis of both hips Patient continues to have some discomfort and pain.  I am more concerned that this is secondary to more of the nerve root impingement.  Patient did have the MRI in 2020 that did show a right-sided S1 nerve root impingement but could be contributing.  Patient will continue  with the home exercises, icing regimen, [name] change in medication at this time but may need to consider the possibility of increasing the gabapentin.  Follow-up again in 6 weeks  Low back pain Patient has had low back pain for quite some time.  Still concerned that there could be a potential nerve root impingement.  We discussed the possibility of advanced imaging.  Patient does not want to do  this at the moment or injection.  Patient responds fairly well to osteopathic manipulation.  We will continue to monitor closely.  Follow-up with me again 6 weeks.   Nonallopathic problems  Decision today to treat with OMT was based on Physical Exam  After verbal consent patient was treated with HVLA, ME, FPR techniques in cervical, rib, thoracic, lumbar, and sacral  areas  Patient tolerated the procedure well with improvement in symptoms  Patient given exercises, stretches and lifestyle modifications  See medications in patient instructions if given  Patient will follow up in 4-8 weeks      The above documentation has been reviewed and is accurate and complete [name], DO       Note: This dictation was prepared with Dragon dictation along with smaller phrase technology. Any transcriptional errors that result from this process are unintentional.

## 2020-11-25 ENCOUNTER — Other Ambulatory Visit: Payer: Self-pay

## 2020-11-25 ENCOUNTER — Encounter: Payer: Self-pay | Admitting: Family Medicine

## 2020-11-25 ENCOUNTER — Ambulatory Visit: Payer: 59 | Admitting: Family Medicine

## 2020-11-25 VITALS — BP 120/76 | HR 61 | Ht 66.0 in | Wt 163.0 lb

## 2020-11-25 DIAGNOSIS — M9908 Segmental and somatic dysfunction of rib cage: Secondary | ICD-10-CM | POA: Diagnosis not present

## 2020-11-25 DIAGNOSIS — M9902 Segmental and somatic dysfunction of thoracic region: Secondary | ICD-10-CM | POA: Diagnosis not present

## 2020-11-25 DIAGNOSIS — M545 Low back pain, unspecified: Secondary | ICD-10-CM

## 2020-11-25 DIAGNOSIS — M7061 Trochanteric bursitis, right hip: Secondary | ICD-10-CM | POA: Diagnosis not present

## 2020-11-25 DIAGNOSIS — M9904 Segmental and somatic dysfunction of sacral region: Secondary | ICD-10-CM

## 2020-11-25 DIAGNOSIS — G8929 Other chronic pain: Secondary | ICD-10-CM

## 2020-11-25 DIAGNOSIS — M7062 Trochanteric bursitis, left hip: Secondary | ICD-10-CM

## 2020-11-25 DIAGNOSIS — M9901 Segmental and somatic dysfunction of cervical region: Secondary | ICD-10-CM | POA: Diagnosis not present

## 2020-11-25 DIAGNOSIS — M9903 Segmental and somatic dysfunction of lumbar region: Secondary | ICD-10-CM

## 2020-11-25 NOTE — Assessment & Plan Note (Signed)
Patient continues to have some discomfort and pain.  I am more concerned that this is secondary to more of the nerve root impingement.  Patient did have the MRI in 2020 that did show a right-sided S1 nerve root impingement but could be contributing.  Patient will continue with the home exercises, icing regimen, no change in medication at this time but may need to consider the possibility of increasing the gabapentin.  Follow-up again in 6 weeks

## 2020-11-25 NOTE — Assessment & Plan Note (Signed)
Patient has had low back pain for quite some time.  Still concerned that there could be a potential nerve root impingement.  We discussed the possibility of advanced imaging.  Patient does not want to do this at the moment or injection.  Patient responds fairly well to osteopathic manipulation.  We will continue to monitor closely.  Follow-up with me again 6 weeks.

## 2020-11-25 NOTE — Patient Instructions (Signed)
Great to see you as always  Stay active Stay away from that leg press.  Call me if things change   See me again in 6-8 weeks

## 2020-11-28 DIAGNOSIS — L578 Other skin changes due to chronic exposure to nonionizing radiation: Secondary | ICD-10-CM | POA: Diagnosis not present

## 2020-11-28 DIAGNOSIS — L550 Sunburn of first degree: Secondary | ICD-10-CM | POA: Diagnosis not present

## 2020-11-28 DIAGNOSIS — L814 Other melanin hyperpigmentation: Secondary | ICD-10-CM | POA: Diagnosis not present

## 2020-11-28 DIAGNOSIS — L918 Other hypertrophic disorders of the skin: Secondary | ICD-10-CM | POA: Diagnosis not present

## 2020-11-28 DIAGNOSIS — D225 Melanocytic nevi of trunk: Secondary | ICD-10-CM | POA: Diagnosis not present

## 2020-11-28 DIAGNOSIS — L905 Scar conditions and fibrosis of skin: Secondary | ICD-10-CM | POA: Diagnosis not present

## 2020-11-28 DIAGNOSIS — L570 Actinic keratosis: Secondary | ICD-10-CM | POA: Diagnosis not present

## 2020-11-28 DIAGNOSIS — L821 Other seborrheic keratosis: Secondary | ICD-10-CM | POA: Diagnosis not present

## 2020-11-28 DIAGNOSIS — D2239 Melanocytic nevi of other parts of face: Secondary | ICD-10-CM | POA: Diagnosis not present

## 2020-12-02 DIAGNOSIS — N393 Stress incontinence (female) (male): Secondary | ICD-10-CM | POA: Diagnosis not present

## 2020-12-02 DIAGNOSIS — M5380 Other specified dorsopathies, site unspecified: Secondary | ICD-10-CM | POA: Diagnosis not present

## 2020-12-02 DIAGNOSIS — D490 Neoplasm of unspecified behavior of digestive system: Secondary | ICD-10-CM | POA: Diagnosis not present

## 2020-12-02 DIAGNOSIS — M1990 Unspecified osteoarthritis, unspecified site: Secondary | ICD-10-CM | POA: Diagnosis not present

## 2020-12-02 DIAGNOSIS — G4700 Insomnia, unspecified: Secondary | ICD-10-CM | POA: Diagnosis not present

## 2020-12-02 DIAGNOSIS — Z800 Family history of malignant neoplasm of digestive organs: Secondary | ICD-10-CM | POA: Diagnosis not present

## 2020-12-02 DIAGNOSIS — K5790 Diverticulosis of intestine, part unspecified, without perforation or abscess without bleeding: Secondary | ICD-10-CM | POA: Diagnosis not present

## 2020-12-02 DIAGNOSIS — R635 Abnormal weight gain: Secondary | ICD-10-CM | POA: Diagnosis not present

## 2020-12-02 DIAGNOSIS — E785 Hyperlipidemia, unspecified: Secondary | ICD-10-CM | POA: Diagnosis not present

## 2020-12-06 ENCOUNTER — Other Ambulatory Visit (HOSPITAL_COMMUNITY): Payer: Self-pay

## 2020-12-13 ENCOUNTER — Encounter: Payer: Self-pay | Admitting: Gastroenterology

## 2020-12-13 ENCOUNTER — Telehealth: Payer: Self-pay

## 2020-12-13 ENCOUNTER — Ambulatory Visit: Payer: 59 | Admitting: Gastroenterology

## 2020-12-13 ENCOUNTER — Other Ambulatory Visit: Payer: 59

## 2020-12-13 VITALS — BP 122/70 | HR 56 | Ht 65.0 in | Wt 157.2 lb

## 2020-12-13 DIAGNOSIS — R195 Other fecal abnormalities: Secondary | ICD-10-CM

## 2020-12-13 DIAGNOSIS — R194 Change in bowel habit: Secondary | ICD-10-CM

## 2020-12-13 DIAGNOSIS — D490 Neoplasm of unspecified behavior of digestive system: Secondary | ICD-10-CM

## 2020-12-13 NOTE — Telephone Encounter (Signed)
-----   Message from Loralie Champagne, PA-C sent at 12/13/2020 12:51 PM EDT ----- Thank you!  Jacklin Zwick, will you contact the patient let her know that I discussed with Dr. Ardis Hughs and he said that she does not need ongoing surveillance imaging of her pancreas.  Thank you,  Jess  ----- Message ----- From: Milus Banister, MD Sent: 12/13/2020  12:40 PM EDT To: Laban Emperor Zehr, PA-C  She does not need surveillance imaging of her pancreas.   ----- Message ----- From: Loralie Champagne, PA-C Sent: 12/13/2020  12:36 PM EDT To: Milus Banister, MD  Hello.  You performed this lady's EUS back in 2015 before she underwent distal pancreatectomy for IPMN.  She is here today to establish care with Dr. Silverio Decamp.  Her previous GI was doing almost yearly CT scans to follow that issue.  I didn't know if you were able to review her chart and let me know what your thoughts are or what the guidelines say about continued CT scan surveillance.  I appreciate your help!  Thank you!   Jess

## 2020-12-13 NOTE — Progress Notes (Signed)
12/13/2020 [name] [id number] 06-Jul-1956   HISTORY OF PRESENT ILLNESS: This is a 65 year old female who is new to our office.  She is actually here today to establish care with Dr. Silverio Decamp.  She actually had an appoint with Dr. Silverio Decamp and apparently was contacted to reschedule, but was unaware.  Dr. Silverio Decamp is not in the office today so she was placed on my schedule.  She previously was following with Dr. Barney Drain in [place].  She has a history of IPMN with distal pancreatectomy in 2015 by Dr. Barry Dienes.  Has essentially been undergoing CT scans almost yearly for surveillance since that time.  She was just wondering about ongoing/continued surveillance and if another CT scan is needed since her last was over a year ago in January 2021.  Everything looked good at that time.  She also reports a change in her bowel habits over the past year.  She reports alternating loose stool/diarrhea and constipation.  She says that she visits the bathroom frequently throughout the day.  She said the stools will change even throughout the course of the day from diarrhea to constipation, etc.  She has had a colonoscopy and May 2021 by Dr. Oneida Alar that showed the following:  - The examined portion of the ileum was normal. - One 3 mm polyp in the cecum, removed with a cold snare. Resected and retrieved. - MILD Diverticulosis at the hepatic flexure. - MODERATE Diverticulosis in the recto-sigmoid colon and in the sigmoid colon. - HEME POSITIVE STOOLS/INTERMITTENT RECTAL BLEEDING DUE TO and internal hemorrhoids. - Tortuous LEFT colon.  Polyp was a tubular adenoma.  Past Medical History:  Diagnosis Date  . Cancer ([place])    skin cancer  . Diverticulosis    CT  . Fibroids 09/07/2013  . PONV (postoperative nausea and vomiting)    non recent   . S/P hysterectomy with oophorectomy 09/07/2013  . Seasonal allergies   . SVD (spontaneous vaginal delivery)    x 1   Past Surgical History:  Procedure  Laterality Date  . ABDOMINAL HYSTERECTOMY    . COLONOSCOPY     2 or 3  . COLONOSCOPY N/A 01/04/2017   Dr. Oneida Alar: Significant looping of the rectosigmoid colon, 4 mm hyperplastic polyp removed from rectum, internal hemorrhoids.  Next colonoscopy in 5 to 10 years with MAC and ultra slim colonoscope.  . COLONOSCOPY WITH PROPOFOL N/A 12/05/2019   Procedure: COLONOSCOPY WITH PROPOFOL;  Surgeon: Danie Binder, MD;  [facility];  Service: Endoscopy;  Laterality: N/A;  7:30am  . DILATION AND CURETTAGE OF UTERUS     x 3  . EUS N/A 11/16/2013   Dr. Ardis Hughs: parenchyma in tail of pancreas suggests chronic pancreatitis, abrupt changes in main pancreatic duct in tail becomes dilated up to 4-72m, numerous associated dilated side branches, no solid mass. bx most c/w IPMN  . EYE SURGERY     lasik bilateral  . LAPAROSCOPY N/A 09/25/2013   Procedure: LAPAROSCOPY OPERATIVE;  Surgeon: VElveria Royals MD;  [facility];  Service: Gynecology;  Laterality: N/A;  . PANCREATECTOMY N/A 12/07/2013   Procedure: LAPAROSCOPIC DISTAL  PANCREATECTOMY;  Surgeon: FStark Klein MD;  [facility]  Service: General;  Laterality: N/A;  . POLYPECTOMY  01/04/2017   Procedure: POLYPECTOMY;  Surgeon: FDanie Binder MD;  [facility];  Service: Endoscopy;;  rectal  . POLYPECTOMY  12/05/2019   Procedure: POLYPECTOMY;  Surgeon: FDanie Binder MD;  [facility];  Service: Endoscopy;;  . ROBOTIC ASSISTED TOTAL HYSTERECTOMY WITH BILATERAL SALPINGO OOPHERECTOMY Bilateral 09/07/2013   Procedure: ROBOTIC ASSISTED TOTAL HYSTERECTOMY WITH BILATERAL SALPINGO OOPHORECTOMY;  Surgeon: Elveria Royals, MD;  Location: [facility];  Service: Gynecology;  Laterality: Bilateral;  . WISDOM TOOTH EXTRACTION      reports that she quit smoking about 33 years ago. Her smoking use included cigarettes. She has a 10.00 pack-year smoking history. She has never used smokeless tobacco. She reports current alcohol use. She reports  that she does not use drugs. family history includes COPD in her mother; Cancer (age of onset: 71) in her mother; Colon cancer in her mother. No Known Allergies    Outpatient Encounter Medications as of 12/13/2020  Medication Sig  . ALPRAZolam (XANAX) 0.5 MG tablet Take 0.5 mg by mouth daily as needed for anxiety.  Marland Kitchen ascorbic acid (VITAMIN C) 100 MG tablet Take 100 mg by mouth daily.   . Azelaic Acid 15 % cream APPLY 1 GRAM TOPICALLY ON THE SKIN ONCE DAILY.  Marland Kitchen b complex vitamins tablet Take 1 tablet by mouth daily.  . Calcium Carb-Cholecalciferol 600-800 MG-UNIT TABS Take 1 tablet by mouth daily.  . cycloSPORINE (RESTASIS) 0.05 % ophthalmic emulsion PLACE 1 DROP INTO BOTH EYES 2 TIMES DAILY.  . Diclofenac Sodium 2 % SOLN Place 2 g onto the skin 2 (two) times daily. (Patient taking differently: Place 2 g onto the skin 2 (two) times daily as needed (pain).)  . dimenhyDRINATE (DRAMAMINE) 50 MG tablet Take 50 mg by mouth in the morning and at bedtime.  Marland Kitchen estradiol (VIVELLE-DOT) 0.025 MG/24HR APPLY 1 PATCH TO SKIN TWICE A WEEK  . fexofenadine (ALLEGRA) 180 MG tablet Take 180 mg by mouth daily as needed for allergies or rhinitis.  . fexofenadine-pseudoephedrine (ALLEGRA-D) 60-120 MG per tablet Take 1 tablet by mouth daily.  Marland Kitchen ipratropium (ATROVENT) 0.06 % nasal spray INSTILL 1 TO 2 SPRAYS IN EACH NOSTRIL 3-4 TIMES A DAY AS NEEDED  . levocetirizine (XYZAL) 5 MG tablet Take 1 tablet (5 mg total) by mouth daily in the evening  . MELATONIN PO Take 1 tablet by mouth at bedtime.  . meloxicam (MOBIC) 7.5 MG tablet TAKE 1 TABLET (7.5 MG TOTAL) BY MOUTH DAILY.  . montelukast (SINGULAIR) 10 MG tablet Take 1 tablet by mouth once daily  . Multiple Vitamin (MULTIVITAMIN) tablet Take 1 tablet by mouth daily.  . Omega-3 Fatty Acids (FISH OIL PO) Take 1 capsule by mouth daily.  . RESTASIS 0.05 % ophthalmic emulsion Place 1 drop into both eyes 2 (two) times daily.  Marland Kitchen tretinoin (RETIN-A) 0.05 % cream APPLY  TOPICALLY TO AFFECTED AREA ON THE SKIN NIGHTLY.  . Lactobacillus (ACIDOPHILUS PO) Take 1 capsule by mouth daily.  (Patient not taking: Reported on 12/13/2020)  . [DISCONTINUED] azithromycin (ZITHROMAX) 250 MG tablet TAKE 2 TABLETS BY MOUTH ON DAY 1 THEN TAKE 1 TABLET DAILY FOR THE NEXT 4 DAYS (Patient taking differently: Take by mouth daily. for the next 4 days)  . [DISCONTINUED] COVID-19 mRNA Vac-TriS, Pfizer, (PFIZER-BIONT COVID-19 VAC-TRIS) SUSP injection Inject into the muscle.  . [DISCONTINUED] estradiol (VIVELLE-DOT) 0.0375 MG/24HR APPLY 1 PATCH TWICE A WEEK BY TRANSDERMAL ROUTE.  . [DISCONTINUED] estradiol (VIVELLE-DOT) 0.1 MG/24HR patch Place 1 patch onto the skin 2 (two) times a week. Wednesdays and Saturdays  . [DISCONTINUED] fluticasone (FLONASE) 50 MCG/ACT nasal spray Place 1 spray into both nostrils in the morning and at bedtime.   . [DISCONTINUED] gabapentin (NEURONTIN) 100 MG  capsule Take 2 capsules (200 mg total) by mouth at bedtime.  . [DISCONTINUED] Insulin Pen Needle 32G X 4 MM MISC USE ONE PEN NEEDLE DAILY AS DIRECTED.  . [DISCONTINUED] ipratropium (ATROVENT) 0.03 % nasal spray PLACE 2 SPRAYS IN EACH NOSTRIL TWICE A DAY AS NEEDED FOR NASAL DRAINAGE  . [DISCONTINUED] ipratropium (ATROVENT) 0.03 % nasal spray USE 1 TO 2 SPRAYS IN EACH NOSTRIL 3 TIMES A DAY AS NEEDED  . [DISCONTINUED] Liraglutide -Weight Management 18 MG/3ML SOPN WK1: INJECT 0.6 INTO THE SKIN IN ABDOMEN, THIGH OR UPPER ARMS ONCE DAILY. WK2: INJECT 1.2 MG DAILY. WK3: INJECT 1.8 MG DAILY. WK4: INJECT 3MG DAILY.  . [DISCONTINUED] meloxicam (MOBIC) 15 MG tablet TAKE 1 TABLET BY MOUTH DAILY. (Patient taking differently: Take 15 mg by mouth daily as needed for pain.)  . [DISCONTINUED] montelukast (SINGULAIR) 10 MG tablet TAKE 1 TABLET BY MOUTH EVERY DAY  . [DISCONTINUED] Polyethyl Glycol-Propyl Glycol (SYSTANE OP) Place 1 drop into both eyes 2 (two) times daily as needed (dry eyes).   . [DISCONTINUED]  promethazine-dextromethorphan (PROMETHAZINE-DM) 6.25-15 MG/5ML syrup TAKE 5 ML BY MOUTH AS NEEDED EVERY 6 HRS FOR COUGH, CAUTION SEDATION  . [DISCONTINUED] Semaglutide-Weight Management 0.25 MG/0.5ML SOAJ INJECT 0.25MG INTO THE SKIN ONCE A WEEK   No facility-administered encounter medications on file as of 12/13/2020.     REVIEW OF SYSTEMS  : All other systems reviewed and negative except where noted in the History of Present Illness.   PHYSICAL EXAM: Ht _0  (1.651 m) Comment: height measured without shoes  Wt 157 lb 4 oz (71.3 kg)   BMI 26.17 kg/m  General: Well developed white female in no acute distress Head: Normocephalic and atraumatic Eyes:  Sclerae anicteric, conjunctiva pink. Ears: Normal auditory acuity Lungs: Clear throughout to auscultation; no W/R/R. Heart: Regular rate and rhythm; no M/R/G. Abdomen: Soft, non-distended.  BS present.  Non-tender. Musculoskeletal: Symmetrical with no gross deformities  Skin: No lesions on visible extremities Extremities: No edema  Neurological: Alert oriented x 4, grossly non-focal Psychological:  Alert and cooperative. Normal mood and affect  ASSESSMENT AND PLAN: *Change in bowel habits over the past year with alternating diarrhea/loose stools and constipation at times as well as increased frequency which sounds like incomplete emptying and the right need to return to the bathroom several times throughout the day.  I think EPI is unlikely but will check pancreatic fecal elastase to rule it out.  Otherwise she just had a colonoscopy last year.  Will try Benefiber 2 teaspoons mixed in 8 ounces of liquid daily and increase to twice daily if needed/tolerated.  She will follow-up with Dr. Silverio Decamp. *IPMN status post distal pancreatectomy in 2015.  Has been getting about yearly CT scans to follow this.  Will discuss with Dr. Ardis Hughs regarding need for ongoing surveillance. *Family history of colon cancer in her mother: Will need repeat  colonoscopy May 2026.   CC:  Prince Solian, MD

## 2020-12-13 NOTE — Patient Instructions (Signed)
If you are age 64 or older, your body mass index should be between 23-30. Your Body mass index is 26.17 kg/m. If this is out of the aforementioned range listed, please consider follow up with your Primary Care Provider.  If you are age 65 or younger, your body mass index should be between 19-25. Your Body mass index is 26.17 kg/m. If this is out of the aformentioned range listed, please consider follow up with your Primary Care Provider.   Your provider has requested that you go to the basement level for lab work before leaving today. Press "B" on the elevator. The lab is located at the first door on the left as you exit the elevator.  Start Benefiber 2 teaspoons in 8 ounces of liquid daily and may increase to twice daily if tolerated.   Follow up with Dr. Silverio Decamp

## 2020-12-13 NOTE — Progress Notes (Signed)
No need for pancreatic surveillance imaging

## 2020-12-13 NOTE — Telephone Encounter (Signed)
The pt has been advised and thanked me for the call  

## 2020-12-20 NOTE — Progress Notes (Signed)
Reviewed and agree with documentation and assessment and plan. K. Veena Joscelyne Renville , MD   

## 2020-12-25 DIAGNOSIS — F4323 Adjustment disorder with mixed anxiety and depressed mood: Secondary | ICD-10-CM | POA: Diagnosis not present

## 2020-12-31 ENCOUNTER — Encounter: Payer: Self-pay | Admitting: Family Medicine

## 2021-01-01 ENCOUNTER — Ambulatory Visit (INDEPENDENT_AMBULATORY_CARE_PROVIDER_SITE_OTHER): Payer: 59

## 2021-01-01 ENCOUNTER — Encounter: Payer: Self-pay | Admitting: Family Medicine

## 2021-01-01 ENCOUNTER — Other Ambulatory Visit: Payer: Self-pay

## 2021-01-01 ENCOUNTER — Ambulatory Visit: Payer: Self-pay

## 2021-01-01 ENCOUNTER — Ambulatory Visit: Payer: 59 | Admitting: Family Medicine

## 2021-01-01 VITALS — BP 128/80 | HR 61 | Ht 65.0 in

## 2021-01-01 DIAGNOSIS — M25552 Pain in left hip: Secondary | ICD-10-CM | POA: Diagnosis not present

## 2021-01-01 DIAGNOSIS — M9904 Segmental and somatic dysfunction of sacral region: Secondary | ICD-10-CM | POA: Diagnosis not present

## 2021-01-01 DIAGNOSIS — M9902 Segmental and somatic dysfunction of thoracic region: Secondary | ICD-10-CM

## 2021-01-01 DIAGNOSIS — M7061 Trochanteric bursitis, right hip: Secondary | ICD-10-CM | POA: Diagnosis not present

## 2021-01-01 DIAGNOSIS — M545 Low back pain, unspecified: Secondary | ICD-10-CM

## 2021-01-01 DIAGNOSIS — M25559 Pain in unspecified hip: Secondary | ICD-10-CM | POA: Diagnosis not present

## 2021-01-01 DIAGNOSIS — M9908 Segmental and somatic dysfunction of rib cage: Secondary | ICD-10-CM

## 2021-01-01 DIAGNOSIS — G8929 Other chronic pain: Secondary | ICD-10-CM

## 2021-01-01 DIAGNOSIS — M9901 Segmental and somatic dysfunction of cervical region: Secondary | ICD-10-CM

## 2021-01-01 DIAGNOSIS — M25551 Pain in right hip: Secondary | ICD-10-CM | POA: Diagnosis not present

## 2021-01-01 DIAGNOSIS — M7062 Trochanteric bursitis, left hip: Secondary | ICD-10-CM

## 2021-01-01 DIAGNOSIS — M9903 Segmental and somatic dysfunction of lumbar region: Secondary | ICD-10-CM | POA: Diagnosis not present

## 2021-01-01 NOTE — Progress Notes (Signed)
[facility] [place] Phone: [phone] Subjective:   [name], am serving as a scribe for Dr. Hulan Saas. This visit occurred during the SARS-CoV-2 public health emergency.  Safety protocols were in place, including screening questions prior to the visit, additional usage of staff PPE, and extensive cleaning of exam room while observing appropriate contact time as indicated for disinfecting solutions.   I'm seeing this patient by the request  of:  [name], [name], MD  CC: Bilateral hip pain  [id number]  [name] is a 65 y.o. female coming in with complaint of B hip pain and back pain. States that her hips are still sore over greater trochanter. Pain is bad enough that it wakes her up. Bilateral injections given on 10/17/2020 but hip pain has started to increase recently. Patient has been using meloxicam for pain relief.  Patient describes the pain as a dull, throbbing aching pain that seems to be somewhat worse at night.  Denies any fevers, chills, any abnormal weight loss      Past Medical History:  Diagnosis Date  . Basal cell carcinoma    skin cancer  . Colon polyps   . Diverticulosis    CT  . Fibroids 09/07/2013  . PONV (postoperative nausea and vomiting)    non recent   . S/P hysterectomy with oophorectomy 09/07/2013  . Seasonal allergies   . SVD (spontaneous vaginal delivery)    x 1   Past Surgical History:  Procedure Laterality Date  . ABDOMINAL HYSTERECTOMY    . COLONOSCOPY     2 or 3  . COLONOSCOPY N/A 01/04/2017   Dr. Oneida Alar: Significant looping of the rectosigmoid colon, 4 mm hyperplastic polyp removed from rectum, internal hemorrhoids.  Next colonoscopy in 5 to 10 years with MAC and ultra slim colonoscope.  . COLONOSCOPY WITH PROPOFOL N/A 12/05/2019   Procedure: COLONOSCOPY WITH PROPOFOL;  Surgeon: Danie Binder, MD;  [facility];  Service: Endoscopy;  Laterality: N/A;  7:30am   . DILATION AND CURETTAGE OF UTERUS     x 3  . EUS N/A 11/16/2013   Dr. Ardis Hughs: parenchyma in tail of pancreas suggests chronic pancreatitis, abrupt changes in main pancreatic duct in tail becomes dilated up to 4-58m, numerous associated dilated side branches, [name] solid mass. bx most c/w IPMN  . EYE SURGERY     lasik bilateral  . LAPAROSCOPY N/A 09/25/2013   Procedure: LAPAROSCOPY OPERATIVE;  Surgeon: VElveria Royals MD;  [facility];  Service: Gynecology;  Laterality: N/A;  . PANCREATECTOMY N/A 12/07/2013   Procedure: LAPAROSCOPIC DISTAL  PANCREATECTOMY;  Surgeon: FStark Klein MD;  [facility]  Service: General;  Laterality: N/A;  . POLYPECTOMY  01/04/2017   Procedure: POLYPECTOMY;  Surgeon: FDanie Binder MD;  [facility];  Service: Endoscopy;;  rectal  . POLYPECTOMY  12/05/2019   Procedure: POLYPECTOMY;  Surgeon: FDanie Binder MD;  [facility];  Service: Endoscopy;;  . ROBOTIC ASSISTED TOTAL HYSTERECTOMY WITH BILATERAL SALPINGO OOPHERECTOMY Bilateral 09/07/2013   Procedure: ROBOTIC ASSISTED TOTAL HYSTERECTOMY WITH BILATERAL SALPINGO OOPHORECTOMY;  Surgeon: VElveria Royals MD;  [facility];  Service: Gynecology;  Laterality: Bilateral;  . WISDOM TOOTH EXTRACTION     Social History   Socioeconomic History  . Marital status: Married    Spouse name: Not on file  . Number of children: 1  . Years of education: Not on file  .  Highest education level: Not on file  Occupational History  . Occupation: Programmer, multimedia: [employer], [employer] Advertising account executive  Tobacco Use  . Smoking status: Former Smoker    Packs/day: 1.00    Years: 10.00    Pack years: 10.00    Types: Cigarettes    Quit date: 01/02/1987    Years since quitting: 34.0  . Smokeless tobacco: Never Used  Vaping Use  . Vaping Use: Never used  Substance and Sexual Activity  . Alcohol use: Yes    Comment: 2-3 glases of wine on weekend nights  . Drug use: [name]   . Sexual activity: Not Currently    Birth control/protection: Post-menopausal, Surgical  Other Topics Concern  . Not on file  Social History Narrative  . Not on file   Social Determinants of Health   Financial Resource Strain: Not on file  Food Insecurity: Not on file  Transportation Needs: Not on file  Physical Activity: Not on file  Stress: Not on file  Social Connections: Not on file   [name] Known Allergies Family History  Problem Relation Age of Onset  . COPD Mother   . Colon cancer Mother   . Lung cancer Mother 44  . Diabetes Brother   . Heart disease Maternal Grandfather   . Diverticulitis Brother     Current Outpatient Medications (Endocrine & Metabolic):  .  estradiol (VIVELLE-DOT) 0.025 MG/24HR, APPLY 1 PATCH TO SKIN TWICE A WEEK   Current Outpatient Medications (Respiratory):  .  fexofenadine (ALLEGRA) 180 MG tablet, Take 180 mg by mouth daily as needed for allergies or rhinitis. .  fexofenadine-pseudoephedrine (ALLEGRA-D) 60-120 MG per tablet, Take 1 tablet by mouth daily. Marland Kitchen  ipratropium (ATROVENT) 0.06 % nasal spray, INSTILL 1 TO 2 SPRAYS IN EACH NOSTRIL 3-4 TIMES A DAY AS NEEDED .  levocetirizine (XYZAL) 5 MG tablet, Take 1 tablet (5 mg total) by mouth daily in the evening .  montelukast (SINGULAIR) 10 MG tablet, Take 1 tablet by mouth once daily  Current Outpatient Medications (Analgesics):  .  meloxicam (MOBIC) 7.5 MG tablet, TAKE 1 TABLET (7.5 MG TOTAL) BY MOUTH DAILY.   Current Outpatient Medications (Other):  Marland Kitchen  ALPRAZolam (XANAX) 0.5 MG tablet, Take 0.5 mg by mouth daily as needed for anxiety. Marland Kitchen  ascorbic acid (VITAMIN C) 100 MG tablet, Take 100 mg by mouth daily.  .  Azelaic Acid 15 % cream, APPLY 1 GRAM TOPICALLY ON THE SKIN ONCE DAILY. Marland Kitchen  b complex vitamins tablet, Take 1 tablet by mouth daily. .  Calcium Carb-Cholecalciferol 600-800 MG-UNIT TABS, Take 1 tablet by mouth daily. .  cycloSPORINE (RESTASIS) 0.05 % ophthalmic emulsion, PLACE 1 DROP INTO  BOTH EYES 2 TIMES DAILY. .  Diclofenac Sodium 2 % SOLN, Place 2 g onto the skin 2 (two) times daily. (Patient taking differently: Place 2 g onto the skin 2 (two) times daily as needed (pain).) .  dimenhyDRINATE (DRAMAMINE) 50 MG tablet, Take 50 mg by mouth in the morning and at bedtime. .  Lactobacillus (ACIDOPHILUS PO), Take 1 capsule by mouth daily. Marland Kitchen  MELATONIN PO, Take 1 tablet by mouth at bedtime. .  Multiple Vitamin (MULTIVITAMIN) tablet, Take 1 tablet by mouth daily. .  Omega-3 Fatty Acids (FISH OIL PO), Take 1 capsule by mouth daily. .  RESTASIS 0.05 % ophthalmic emulsion, Place 1 drop into both eyes 2 (two) times daily. Marland Kitchen  tretinoin (RETIN-A) 0.05 % cream, APPLY TOPICALLY  TO AFFECTED AREA ON THE SKIN NIGHTLY.   Reviewed prior external information including notes and imaging from  primary care provider As well as notes that were available from care everywhere and other healthcare systems.  Past medical history, social, surgical and family history all reviewed in electronic medical record.  [name] pertanent information unless stated regarding to the chief complaint.   Review of Systems:  [name] headache, visual changes, nausea, vomiting, diarrhea, constipation, dizziness, abdominal pain, skin rash, fevers, chills, night sweats, weight loss, swollen lymph nodes,  joint swelling, chest pain, shortness of breath, mood changes. POSITIVE muscle aches, body aches  Objective  Blood pressure 128/80, pulse 61, height _0  (1.651 m), SpO2 98 %.   General: [name] apparent distress alert and oriented x3 mood and affect normal, dressed appropriately.  HEENT: Pupils equal, extraocular movements intact  Respiratory: Patient's speak in full sentences and does not appear short of breath  Cardiovascular: [name] lower extremity edema, non tender, [name] erythema  Gait normal with good balance and coordination.  MSK: Low back exam does have some mild loss of lordosis.  Some tenderness to palpation of the paraspinal  musculature.  Tightness noted of the FABER test bilaterally.  Patient does have severe tenderness over the greater trochanteric area bilaterally.  Good strength both her lower extremities.   Procedure: Real-time Ultrasound Guided Injection of right greater trochanteric bursitis secondary to patient's body habitus Device: GE Logiq Q7 Ultrasound guided injection is preferred based studies that show increased duration, increased effect, greater accuracy, decreased procedural pain, increased response rate, and decreased cost with ultrasound guided versus blind injection.  Verbal informed consent obtained.  Time-out conducted.  Noted [name] overlying erythema, induration, or other signs of local infection.  Skin prepped in a sterile fashion.  Local anesthesia: Topical Ethyl chloride.  With sterile technique and under real time ultrasound guidance:  Greater trochanteric area was visualized and patient's bursa was noted. A 22-gauge 3 inch needle was inserted and 2 cc of 0.5% Marcaine and 1 cc of Kenalog 40 mg/dL was injected. Pictures taken Completed without difficulty  Pain immediately resolved suggesting accurate placement of the medication.  Advised to call if fevers/chills, erythema, induration, drainage, or persistent bleeding.  Impression: Technically successful ultrasound guided injection.   Procedure: Real-time Ultrasound Guided Injection of left  greater trochanteric bursitis secondary to patient's body habitus Device: GE Logiq Q7  Ultrasound guided injection is preferred based studies that show increased duration, increased effect, greater accuracy, decreased procedural pain, increased response rate, and decreased cost with ultrasound guided versus blind injection.  Verbal informed consent obtained.  Time-out conducted.  Noted [name] overlying erythema, induration, or other signs of local infection.  Skin prepped in a sterile fashion.  Local anesthesia: Topical Ethyl chloride.  With sterile  technique and under real time ultrasound guidance:  Greater trochanteric area was visualized and patient's bursa was noted. A 22-gauge 3 inch needle was inserted and 2 cc of 0.5% Marcaine and 1 cc of Kenalog 40 mg/dL was injected. Pictures taken Completed without difficulty  Pain immediately resolved suggesting accurate placement of the medication.  Advised to call if fevers/chills, erythema, induration, drainage, or persistent bleeding.  Impression: Technically successful ultrasound guided injection.  Osteopathic findings C7 flexed rotated and side bent right T8 extended rotated and side bent right with inhaled rib L3 flexed rotated and side bent left Sacrum right on right   Impression and Recommendations:     The above documentation has been reviewed and is accurate and  complete [name], DO

## 2021-01-01 NOTE — Assessment & Plan Note (Signed)
History of an S1 nerve root impingement.  Patient did respond well to a nerve root injection previously but this was left-sided.  Now having bilateral hip pain.  We will consider the possibility of epidurals versus possible advanced imaging.  Last MRI was in 2020.  Hopefully patient responds well to the greater trochanteric injections.

## 2021-01-01 NOTE — Assessment & Plan Note (Signed)
Repeat injection given again today.  Discussed with patient about icing regimen and home exercises.  Discussed patient's biking as well as doing stretches.  Discussed topical anti-inflammatories and icing regimen.  Patient is to increase activity slowly.  Due to patient's low back pain history as well as her pancreatic history would consider advanced imaging if needed imaging if he had more difficulty or repeating the epidural.  Patient will write [place] again in 2 weeks to see how she is doing.  Follow-up again in 4 weeks

## 2021-01-01 NOTE — Patient Instructions (Addendum)
Xray today Injected both hips Drop seat on [place] Keep appt until Monday but if doing better move appt to 3 weeks

## 2021-01-06 ENCOUNTER — Other Ambulatory Visit (HOSPITAL_COMMUNITY): Payer: Self-pay

## 2021-01-06 ENCOUNTER — Encounter: Payer: Self-pay | Admitting: Family Medicine

## 2021-01-06 ENCOUNTER — Other Ambulatory Visit: Payer: Self-pay | Admitting: Internal Medicine

## 2021-01-06 DIAGNOSIS — Z1231 Encounter for screening mammogram for malignant neoplasm of breast: Secondary | ICD-10-CM

## 2021-01-06 MED FILL — Ipratropium Bromide Nasal Soln 0.06% (42 MCG/SPRAY): NASAL | 20 days supply | Qty: 15 | Fill #0 | Status: AC

## 2021-01-06 MED FILL — Estradiol TD Patch Twice Weekly 0.025 MG/24HR: TRANSDERMAL | 84 days supply | Qty: 24 | Fill #0 | Status: AC

## 2021-01-08 ENCOUNTER — Ambulatory Visit: Payer: 59 | Admitting: Family Medicine

## 2021-01-22 NOTE — Progress Notes (Signed)
[place] Phone: [phone] Subjective:   [name], am serving as a scribe for Dr. Hulan Saas.  This visit occurred during the SARS-CoV-2 public health emergency.  Safety protocols were in place, including screening questions prior to the visit, additional usage of staff PPE, and extensive cleaning of exam room while observing appropriate contact time as indicated for disinfecting solutions.    I'm seeing this patient by the request  of:  [name], [name], MD  CC: hip, back and neck pain   [id number]  [name] is a 65 y.o. female coming in with complaint of back and neck pain. OMT 01/01/2021. GT injections on 01/01/2021. Patient states that her hip pain has improved.  Patient states it is very minimal at this time.  Did take approximately a week for the injections to work.  Patient now just regular activities give her some discomfort but nothing that is stopping her from working out.  Patient still has the low back pain.  Denies fevers chills or any abnormal weight loss  Medications patient has been prescribed: Meloxicam          Reviewed prior external information including notes and imaging from previsou exam, outside providers and external EMR if available.   As well as notes that were available from care everywhere and other healthcare systems.  Past medical history, social, surgical and family history all reviewed in electronic medical record.  [name] pertanent information unless stated regarding to the chief complaint.   Past Medical History:  Diagnosis Date   Basal cell carcinoma    skin cancer   Colon polyps    Diverticulosis    CT   Fibroids 09/07/2013   PONV (postoperative nausea and vomiting)    non recent    S/P hysterectomy with oophorectomy 09/07/2013   Seasonal allergies    SVD (spontaneous vaginal delivery)    x 1    [name] Known Allergies   Review of Systems:  [name] headache,  visual changes, nausea, vomiting, diarrhea, constipation, dizziness, abdominal pain, skin rash, fevers, chills, night sweats, weight loss, swollen lymph nodes, oint swelling, chest pain, shortness of breath, mood changes. POSITIVE muscle aches, body aches  Objective  Blood pressure 118/78, pulse 69, height 5\' 5"  (1.651 m), SpO2 98 %.   General: [name] apparent distress alert and oriented x3 mood and affect normal, dressed appropriately.  HEENT: Pupils equal, extraocular movements intact  Respiratory: Patient's speak in full sentences and does not appear short of breath  Cardiovascular: [name] lower extremity edema, non tender, [name] erythema  Gait normal with good balance and coordination.  MSK:  Non tender with full range of motion and good stability and symmetric strength and tone of shoulders, elbows, wrist,  knee and ankles bilaterally.  Back -low back exam does have some mild loss of lordosis.  Tightness noted in the sacroiliac joints bilaterally.  He does have tightness also at the thoracolumbar juncture.  5 out of 5 strength of the lower extremities deep tendon reflexes are intact.  Lacks last 5 degrees of extension but otherwise full range of motion minimal discomfort noted over the greater trochanteric area  Osteopathic findings  C2 flexed rotated and side bent right T3 extended rotated and side bent right inhaled rib T8 extended rotated and side bent left L2 flexed rotated and side bent right Sacrum right on right       Assessment and Plan:  Low back pain Chronic  problem but mild exacerbation.  Has done well with nerve root injection previously.  Patient did respond well to the greater trochanteric injection at this time we will continue to monitor.  Worsening pain once again secondary to patient's past medical history would consider advanced imaging but patient did make some improvement.  Follow-up with me again 6 to 8 weeks   Nonallopathic problems  Decision today to treat with OMT  was based on Physical Exam  After verbal consent patient was treated with HVLA, ME, FPR techniques in cervical, rib, thoracic, lumbar, and sacral  areas  Patient tolerated the procedure well with improvement in symptoms  Patient given exercises, stretches and lifestyle modifications  See medications in patient instructions if given  Patient will follow up in 4-8 weeks     The above documentation has been reviewed and is accurate and complete Sabrina Pulley, DO        Note: This dictation was prepared with Dragon dictation along with smaller phrase technology. Any transcriptional errors that result from this process are unintentional.

## 2021-01-23 ENCOUNTER — Encounter: Payer: Self-pay | Admitting: Family Medicine

## 2021-01-23 ENCOUNTER — Other Ambulatory Visit: Payer: Self-pay

## 2021-01-23 ENCOUNTER — Ambulatory Visit: Payer: 59 | Admitting: Family Medicine

## 2021-01-23 VITALS — BP 118/78 | HR 69 | Ht 65.0 in

## 2021-01-23 DIAGNOSIS — M9908 Segmental and somatic dysfunction of rib cage: Secondary | ICD-10-CM

## 2021-01-23 DIAGNOSIS — M545 Low back pain, unspecified: Secondary | ICD-10-CM | POA: Diagnosis not present

## 2021-01-23 DIAGNOSIS — M7061 Trochanteric bursitis, right hip: Secondary | ICD-10-CM

## 2021-01-23 DIAGNOSIS — M9904 Segmental and somatic dysfunction of sacral region: Secondary | ICD-10-CM | POA: Diagnosis not present

## 2021-01-23 DIAGNOSIS — M9903 Segmental and somatic dysfunction of lumbar region: Secondary | ICD-10-CM | POA: Diagnosis not present

## 2021-01-23 DIAGNOSIS — M9901 Segmental and somatic dysfunction of cervical region: Secondary | ICD-10-CM

## 2021-01-23 DIAGNOSIS — M7062 Trochanteric bursitis, left hip: Secondary | ICD-10-CM

## 2021-01-23 DIAGNOSIS — M9902 Segmental and somatic dysfunction of thoracic region: Secondary | ICD-10-CM

## 2021-01-23 DIAGNOSIS — G8929 Other chronic pain: Secondary | ICD-10-CM

## 2021-01-23 NOTE — Assessment & Plan Note (Signed)
Chronic problem but mild exacerbation.  Has done well with nerve root injection previously.  Patient did respond well to the greater trochanteric injection at this time we will continue to monitor.  Worsening pain once again secondary to patient's past medical history would consider advanced imaging but patient did make some improvement.  Follow-up with me again 6 to 8 weeks

## 2021-01-23 NOTE — Assessment & Plan Note (Signed)
Improvement noted after the injections

## 2021-01-23 NOTE — Patient Instructions (Signed)
Good to see you Glad hip is doing better Continue everything else See me again in 6 weeks

## 2021-01-27 ENCOUNTER — Other Ambulatory Visit (HOSPITAL_COMMUNITY): Payer: Self-pay

## 2021-02-06 IMAGING — DX LUMBAR SPINE - 2-3 VIEW
3 series · 3 of 3 positions shown · non-contrast
Comparison: Lumbar MRI June 08, 2016.

CLINICAL DATA: Lumbago

EXAM:
LUMBAR SPINE - 2-3 VIEW

[l-spine ap]
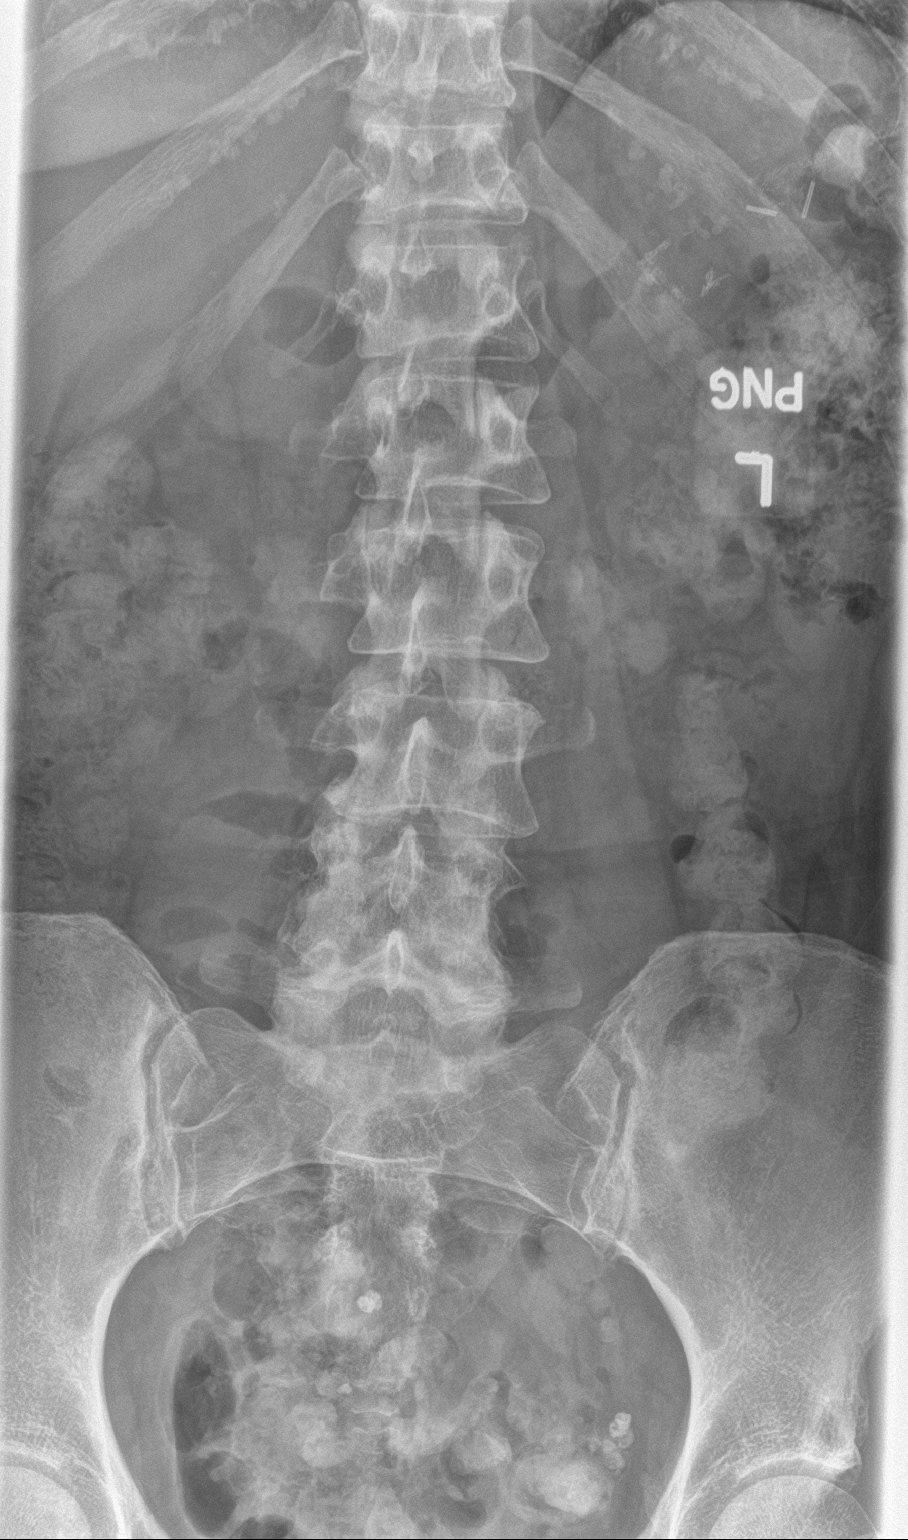

[l-spine lat]
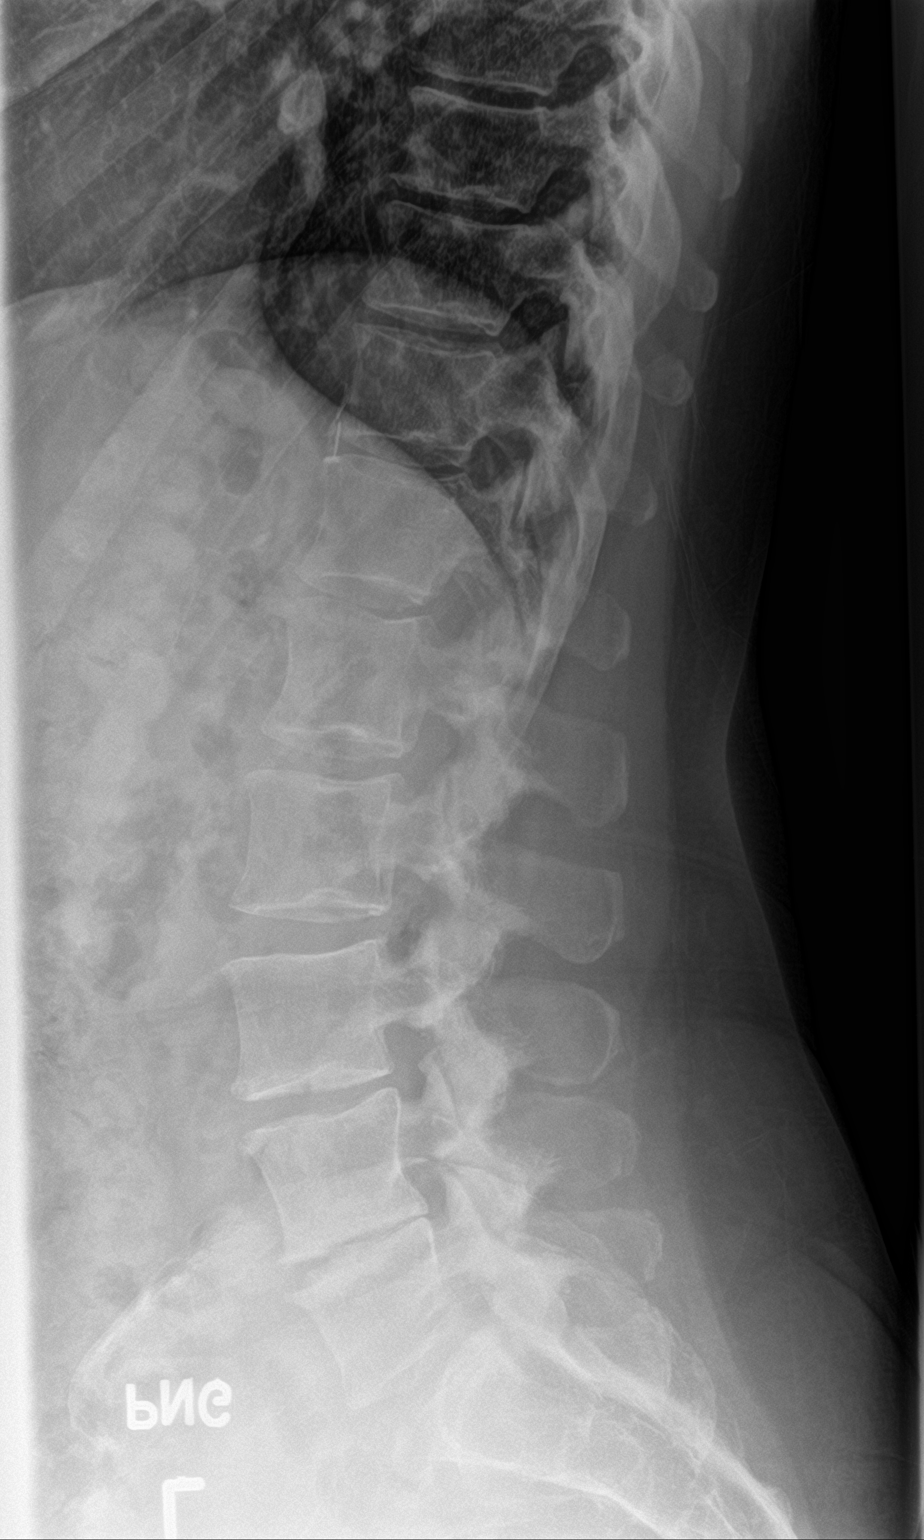

[l-spine spot]
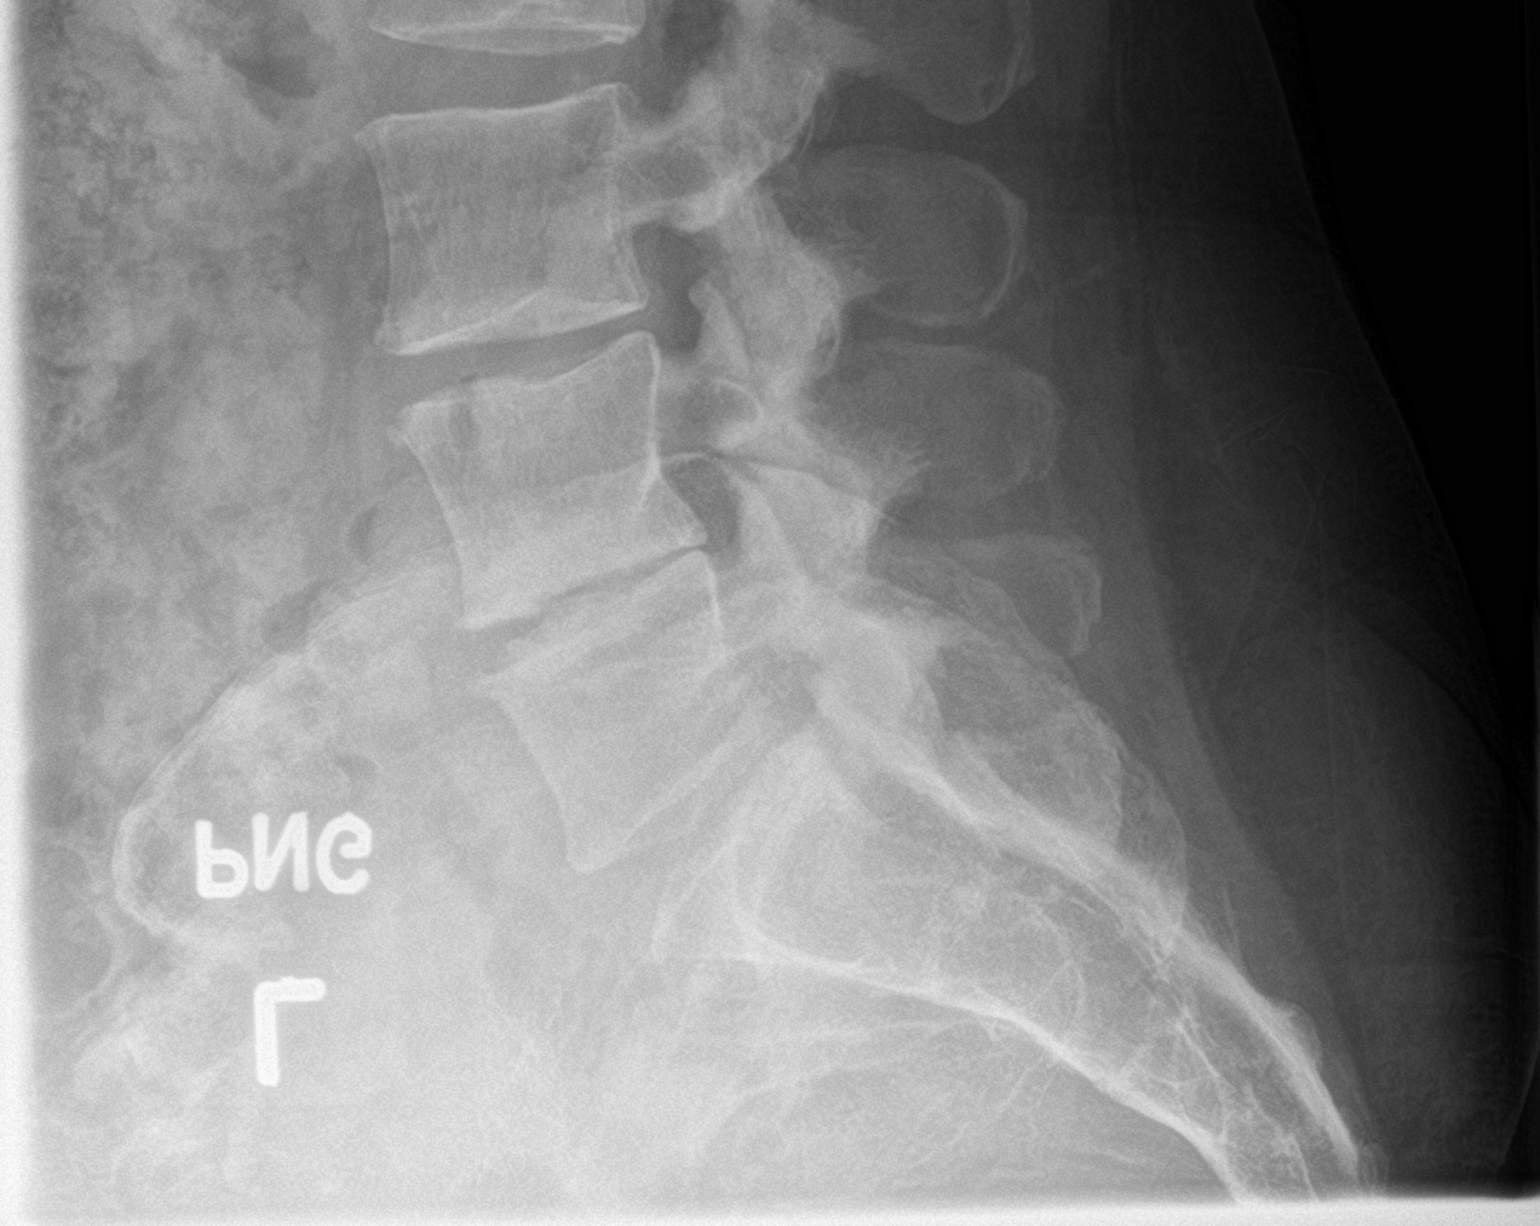

[3 of 3 positions shown; findings below may reference images not displayed]

FINDINGS: Frontal, lateral, and spot lumbosacral lateral images were obtained.
There are 5 non-rib-bearing lumbar type vertebral bodies. The T12
ribs are hypoplastic. There is slight lumbar levoscoliosis. No
fracture is evident. There is 3 mm of anterolisthesis of L5 on S1.
There is no other evident spondylolisthesis. There is marked disc
space narrowing at L4-5. There is moderate disc space narrowing at
L3-4 and L5-S1. No erosive changes are evident.
IMPRESSION: Mild scoliosis. No fracture. Mild spondylolisthesis at L5-S1, likely
secondary to spondylosis. No other spondylolisthesis.

There is osteoarthritic change at L3-4, L4-5, and L5-S1 with disc
space narrowing most marked at L4-5.

## 2021-02-06 IMAGING — DX CHEST - 2 VIEW
2 series · 2 of 2 positions shown · non-contrast
Comparison: None.

CLINICAL DATA: No chest complaints.  Routine exam

EXAM:
CHEST - 2 VIEW

[chest pa]
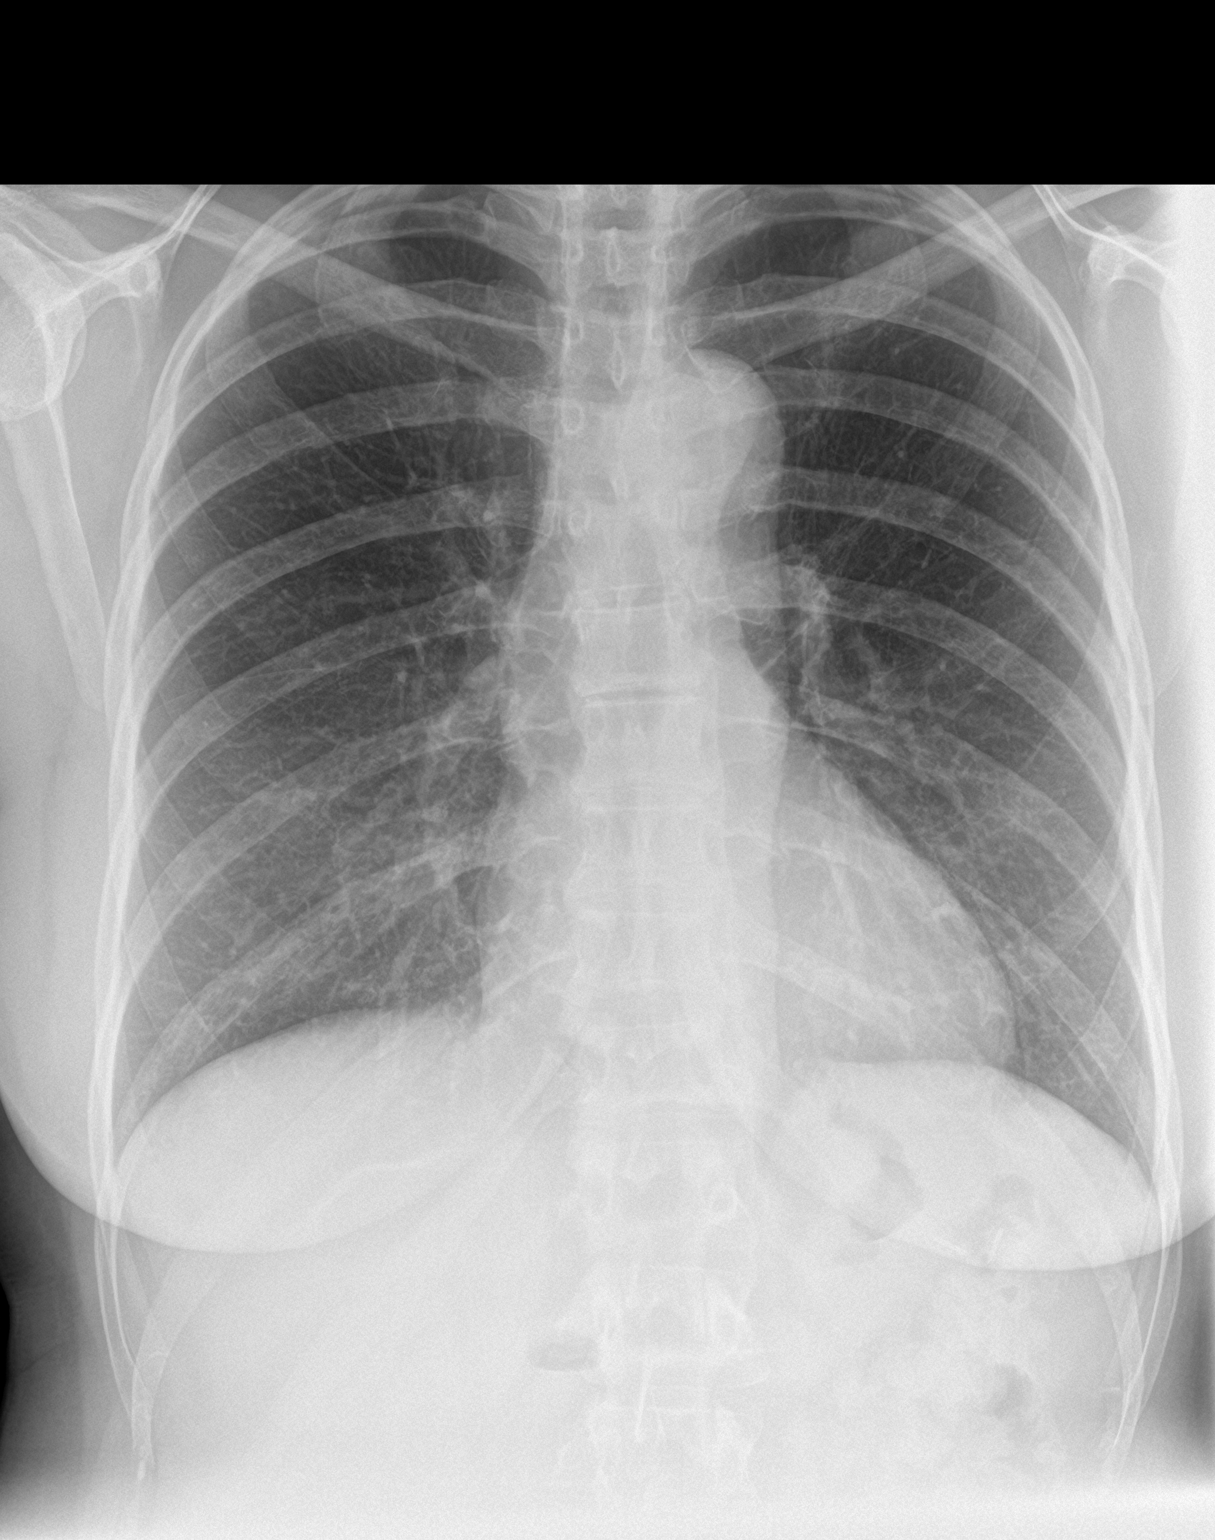

[chest lat]
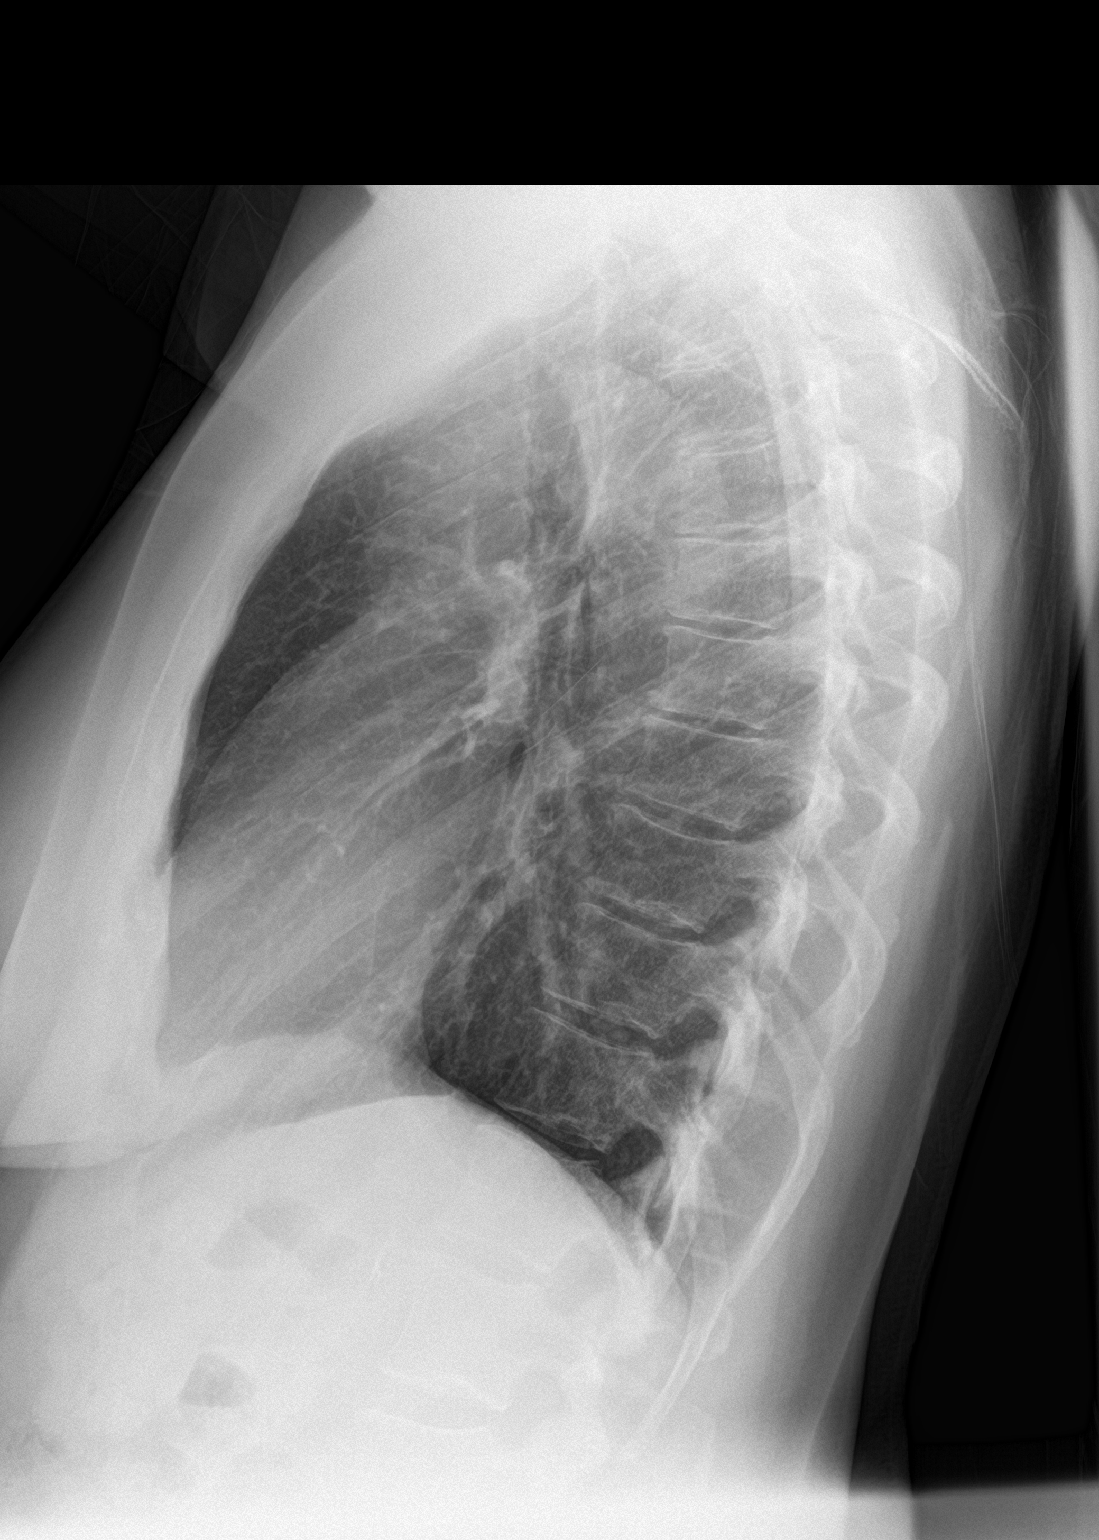

[2 of 2 positions shown; findings below may reference images not displayed]

FINDINGS: Normal cardiac and mediastinal contours. No consolidative pulmonary
opacities. No pleural effusion or pneumothorax. Regional skeleton is
unremarkable.
IMPRESSION: No acute cardiopulmonary process.

## 2021-02-07 ENCOUNTER — Other Ambulatory Visit (HOSPITAL_BASED_OUTPATIENT_CLINIC_OR_DEPARTMENT_OTHER): Payer: Self-pay

## 2021-02-07 MED ORDER — CARESTART COVID-19 HOME TEST VI KIT
PACK | 0 refills | Status: DC
  Filled 2021-02-07: qty 4, 8d supply, fill #0

## 2021-02-07 MED FILL — Ipratropium Bromide Nasal Soln 0.06% (42 MCG/SPRAY): NASAL | 20 days supply | Qty: 15 | Fill #0 | Status: AC

## 2021-02-19 ENCOUNTER — Other Ambulatory Visit (HOSPITAL_COMMUNITY): Payer: Self-pay

## 2021-02-19 DIAGNOSIS — D235 Other benign neoplasm of skin of trunk: Secondary | ICD-10-CM | POA: Diagnosis not present

## 2021-02-19 DIAGNOSIS — L570 Actinic keratosis: Secondary | ICD-10-CM | POA: Diagnosis not present

## 2021-02-19 MED ORDER — TRIAMCINOLONE ACETONIDE 0.1 % EX CREA
1.0000 "application " | TOPICAL_CREAM | Freq: Two times a day (BID) | CUTANEOUS | 0 refills | Status: DC | PRN
  Filled 2021-02-19: qty 45, 23d supply, fill #0

## 2021-02-24 ENCOUNTER — Other Ambulatory Visit: Payer: Self-pay | Admitting: Family Medicine

## 2021-02-24 ENCOUNTER — Other Ambulatory Visit (HOSPITAL_BASED_OUTPATIENT_CLINIC_OR_DEPARTMENT_OTHER): Payer: Self-pay

## 2021-02-24 MED ORDER — MELOXICAM 7.5 MG PO TABS
ORAL_TABLET | Freq: Every day | ORAL | 0 refills | Status: DC
  Filled 2021-02-24: qty 90, 90d supply, fill #0

## 2021-02-24 MED FILL — Cyclosporine (Ophth) Emulsion 0.05%: OPHTHALMIC | 30 days supply | Qty: 60 | Fill #0 | Status: AC

## 2021-02-27 ENCOUNTER — Other Ambulatory Visit: Payer: Self-pay

## 2021-02-27 ENCOUNTER — Ambulatory Visit
Admission: RE | Admit: 2021-02-27 | Discharge: 2021-02-27 | Disposition: A | Discharge status: Home / Self Care | Payer: 59 | Source: Ambulatory Visit

## 2021-02-27 DIAGNOSIS — Z1231 Encounter for screening mammogram for malignant neoplasm of breast: Secondary | ICD-10-CM | POA: Diagnosis not present

## 2021-03-06 ENCOUNTER — Ambulatory Visit: Payer: Self-pay

## 2021-03-06 ENCOUNTER — Ambulatory Visit: Payer: 59 | Admitting: Family Medicine

## 2021-03-06 ENCOUNTER — Encounter: Payer: Self-pay | Admitting: Family Medicine

## 2021-03-06 ENCOUNTER — Other Ambulatory Visit (HOSPITAL_COMMUNITY): Payer: Self-pay

## 2021-03-06 ENCOUNTER — Other Ambulatory Visit: Payer: Self-pay

## 2021-03-06 VITALS — BP 110/72 | HR 64 | Ht 65.0 in

## 2021-03-06 DIAGNOSIS — M9901 Segmental and somatic dysfunction of cervical region: Secondary | ICD-10-CM | POA: Diagnosis not present

## 2021-03-06 DIAGNOSIS — M9903 Segmental and somatic dysfunction of lumbar region: Secondary | ICD-10-CM

## 2021-03-06 DIAGNOSIS — M9902 Segmental and somatic dysfunction of thoracic region: Secondary | ICD-10-CM | POA: Diagnosis not present

## 2021-03-06 DIAGNOSIS — M545 Low back pain, unspecified: Secondary | ICD-10-CM | POA: Diagnosis not present

## 2021-03-06 DIAGNOSIS — M7061 Trochanteric bursitis, right hip: Secondary | ICD-10-CM | POA: Diagnosis not present

## 2021-03-06 DIAGNOSIS — M9904 Segmental and somatic dysfunction of sacral region: Secondary | ICD-10-CM | POA: Diagnosis not present

## 2021-03-06 DIAGNOSIS — M7062 Trochanteric bursitis, left hip: Secondary | ICD-10-CM

## 2021-03-06 DIAGNOSIS — G8929 Other chronic pain: Secondary | ICD-10-CM

## 2021-03-06 DIAGNOSIS — M25551 Pain in right hip: Secondary | ICD-10-CM

## 2021-03-06 DIAGNOSIS — M9908 Segmental and somatic dysfunction of rib cage: Secondary | ICD-10-CM

## 2021-03-06 NOTE — Assessment & Plan Note (Signed)
Patient given injection today and tolerated the procedure well.  Discussed icing regimen and home exercises, discussed avoiding certain activities.  Discussed if this continues we may need to consider the possibility of either MRI of the hip or the possibility of another epidural of the back more at the elbow through the L4 level.  Patient declined both of those today but will follow up with me again in 2 months

## 2021-03-06 NOTE — Assessment & Plan Note (Signed)
Chronic problem and stable.  Still responds well to the meloxicam.  Patient does take it in 5-day burst when needed.  Discussed which activities to doing which wants to avoid.  Continue to work on core strength.  Follow-up again in 6 weeks.

## 2021-03-06 NOTE — Patient Instructions (Addendum)
Good to see you  Injections given today  Keep back in mind for another epidural [email or web address] See me again in 7-8 weeks

## 2021-03-06 NOTE — Progress Notes (Signed)
[place] Phone: [phone] Subjective:   [name], am serving as a scribe for Dr. Hulan Saas.  This visit occurred during the SARS-CoV-2 public health emergency.  Safety protocols were in place, including screening questions prior to the visit, additional usage of staff PPE, and extensive cleaning of exam room while observing appropriate contact time as indicated for disinfecting solutions.   I'm seeing this patient by the request  of:  [name], [name], MD  CC: Back and neck pain and hip pain follow-up  [id number]  ALISSA KNECHT is a 65 y.o. female coming in with complaint of back and neck pain. OMT 01/23/2021. Patient states that R hip over GT has been bothering her more. Pain after activity and at night. Using meloxicam prn. B GT injections 01/01/2021.   Medications patient has been prescribed: Meloxicam  Taking:         Reviewed prior external information including notes and imaging from previsou exam, outside providers and external EMR if available.   As well as notes that were available from care everywhere and other healthcare systems.  Past medical history, social, surgical and family history all reviewed in electronic medical record.  No pertanent information unless stated regarding to the chief complaint.   Past Medical History:  Diagnosis Date   Basal cell carcinoma    skin cancer   Colon polyps    Diverticulosis    CT   Fibroids 09/07/2013   PONV (postoperative nausea and vomiting)    non recent    S/P hysterectomy with oophorectomy 09/07/2013   Seasonal allergies    SVD (spontaneous vaginal delivery)    x 1    No Known Allergies   Review of Systems:  No headache, visual changes, nausea, vomiting, diarrhea, constipation, dizziness, abdominal pain, skin rash, fevers, chills, night sweats, weight loss, swollen lymph nodes, body aches, joint swelling, chest pain, shortness of  breath, mood changes. POSITIVE muscle aches  Objective  Blood pressure 110/72, pulse 64, height '5\' 5"'$  (1.651 m), SpO2 98 %.   General: No apparent distress alert and oriented x3 mood and affect normal, dressed appropriately.  HEENT: Pupils equal, extraocular movements intact  Respiratory: Patient's speak in full sentences and does not appear short of breath  Cardiovascular: No lower extremity edema, non tender, no erythema   Back -low back exam does have some mild loss of lordosis.  Some tightness noted in the paraspinal musculature.  Patient does have severe tenderness to palpation of the greater trochanteric area bilaterally. Osteopathic findings  C2 flexed rotated and side bent right C6 flexed rotated and side bent left T3 extended rotated and side bent right inhaled rib T9 extended rotated and side bent left L2 flexed rotated and side bent right Sacrum right on right   Procedure: Real-time Ultrasound Guided Injection of right greater trochanteric bursitis secondary to patient's body habitus Device: GE Logiq Q7 Ultrasound guided injection is preferred based studies that show increased duration, increased effect, greater accuracy, decreased procedural pain, increased response rate, and decreased cost with ultrasound guided versus blind injection.  Verbal informed consent obtained.  Time-out conducted.  Noted no overlying erythema, induration, or other signs of local infection.  Skin prepped in a sterile fashion.  Local anesthesia: Topical Ethyl chloride.  With sterile technique and under real time ultrasound guidance:  Greater trochanteric area was visualized and patient's bursa was noted. A 22-gauge 3 inch needle was inserted and 4 cc  of 0.5% Marcaine and 1 cc of Kenalog 40 mg/dL was injected. Pictures taken Completed without difficulty  Pain immediately resolved suggesting accurate placement of the medication.  Advised to call if fevers/chills, erythema, induration, drainage, or  persistent bleeding.  Impression: Technically successful ultrasound guided injection.   Procedure: Real-time Ultrasound Guided Injection of left  greater trochanteric bursitis secondary to patient's body habitus Device: GE Logiq Q7  Ultrasound guided injection is preferred based studies that show increased duration, increased effect, greater accuracy, decreased procedural pain, increased response rate, and decreased cost with ultrasound guided versus blind injection.  Verbal informed consent obtained.  Time-out conducted.  Noted no overlying erythema, induration, or other signs of local infection.  Skin prepped in a sterile fashion.  Local anesthesia: Topical Ethyl chloride.  With sterile technique and under real time ultrasound guidance:  Greater trochanteric area was visualized and patient's bursa was noted. A 22-gauge 3 inch needle was inserted and 4 cc of 0.5% Marcaine and 1 cc of Kenalog 40 mg/dL was injected. Pictures taken Completed without difficulty  Pain immediately resolved suggesting accurate placement of the medication.  Advised to call if fevers/chills, erythema, induration, drainage, or persistent bleeding.  Impression: Technically successful ultrasound guided injection.     Assessment and Plan:  Greater trochanteric bursitis of both hips Patient given injection today and tolerated the procedure well.  Discussed icing regimen and home exercises, discussed avoiding certain activities.  Discussed if this continues we may need to consider the possibility of either MRI of the hip or the possibility of another epidural of the back more at the elbow through the L4 level.  Patient declined both of those today but will follow up with me again in 2 months  Low back pain Chronic problem and stable.  Still responds well to the meloxicam.  Patient does take it in 5-day burst when needed.  Discussed which activities to doing which wants to avoid.  Continue to work on core strength.   Follow-up again in 6 weeks.   Nonallopathic problems  Decision today to treat with OMT was based on Physical Exam  After verbal consent patient was treated with HVLA, ME, FPR techniques in cervical, rib, thoracic, lumbar, and sacral  areas  Patient tolerated the procedure well with improvement in symptoms  Patient given exercises, stretches and lifestyle modifications  See medications in patient instructions if given  Patient will follow up in 4-8 weeks      The above documentation has been reviewed and is accurate and complete [name], DO       Note: This dictation was prepared with Dragon dictation along with smaller phrase technology. Any transcriptional errors that result from this process are unintentional.

## 2021-03-12 DIAGNOSIS — F4323 Adjustment disorder with mixed anxiety and depressed mood: Secondary | ICD-10-CM | POA: Diagnosis not present

## 2021-03-13 ENCOUNTER — Other Ambulatory Visit (HOSPITAL_COMMUNITY): Payer: Self-pay

## 2021-03-20 ENCOUNTER — Ambulatory Visit: Payer: Self-pay | Admitting: Gastroenterology

## 2021-03-27 ENCOUNTER — Other Ambulatory Visit: Payer: 59

## 2021-03-27 DIAGNOSIS — R194 Change in bowel habit: Secondary | ICD-10-CM | POA: Diagnosis not present

## 2021-03-27 DIAGNOSIS — R195 Other fecal abnormalities: Secondary | ICD-10-CM

## 2021-03-27 DIAGNOSIS — D490 Neoplasm of unspecified behavior of digestive system: Secondary | ICD-10-CM

## 2021-04-01 ENCOUNTER — Other Ambulatory Visit (HOSPITAL_BASED_OUTPATIENT_CLINIC_OR_DEPARTMENT_OTHER): Payer: Self-pay

## 2021-04-02 LAB — PANCREATIC ELASTASE, FECAL: Pancreatic Elastase-1, Stool: >500 mcg/g

## 2021-04-08 ENCOUNTER — Other Ambulatory Visit: Payer: Self-pay

## 2021-04-08 DIAGNOSIS — R194 Change in bowel habit: Secondary | ICD-10-CM

## 2021-04-08 DIAGNOSIS — R195 Other fecal abnormalities: Secondary | ICD-10-CM

## 2021-04-14 ENCOUNTER — Other Ambulatory Visit: Payer: 59

## 2021-04-14 DIAGNOSIS — R194 Change in bowel habit: Secondary | ICD-10-CM | POA: Diagnosis not present

## 2021-04-14 DIAGNOSIS — R195 Other fecal abnormalities: Secondary | ICD-10-CM | POA: Diagnosis not present

## 2021-04-15 ENCOUNTER — Other Ambulatory Visit (HOSPITAL_BASED_OUTPATIENT_CLINIC_OR_DEPARTMENT_OTHER): Payer: Self-pay

## 2021-04-16 DIAGNOSIS — F4323 Adjustment disorder with mixed anxiety and depressed mood: Secondary | ICD-10-CM | POA: Diagnosis not present

## 2021-04-16 LAB — CLOSTRIDIUM DIFFICILE TOXIN B, QUALITATIVE, REAL-TIME PCR: Toxigenic C. Difficile by PCR: NOT DETECTED

## 2021-04-18 NOTE — Progress Notes (Signed)
SECOND ATTEMPT: ° °LVM requesting returned call. °

## 2021-04-21 ENCOUNTER — Other Ambulatory Visit (HOSPITAL_BASED_OUTPATIENT_CLINIC_OR_DEPARTMENT_OTHER): Payer: Self-pay

## 2021-04-21 MED FILL — Estradiol TD Patch Twice Weekly 0.025 MG/24HR: TRANSDERMAL | 84 days supply | Qty: 24 | Fill #0 | Status: AC

## 2021-04-22 ENCOUNTER — Other Ambulatory Visit (HOSPITAL_BASED_OUTPATIENT_CLINIC_OR_DEPARTMENT_OTHER): Payer: Self-pay

## 2021-04-22 DIAGNOSIS — L299 Pruritus, unspecified: Secondary | ICD-10-CM | POA: Diagnosis not present

## 2021-04-22 DIAGNOSIS — L237 Allergic contact dermatitis due to plants, except food: Secondary | ICD-10-CM | POA: Diagnosis not present

## 2021-04-22 MED ORDER — TRIAMCINOLONE ACETONIDE 0.1 % EX CREA
TOPICAL_CREAM | CUTANEOUS | 1 refills | Status: DC
  Filled 2021-04-22: qty 15, 10d supply, fill #0

## 2021-04-25 ENCOUNTER — Other Ambulatory Visit: Payer: Self-pay

## 2021-04-25 ENCOUNTER — Other Ambulatory Visit (HOSPITAL_BASED_OUTPATIENT_CLINIC_OR_DEPARTMENT_OTHER): Payer: Self-pay

## 2021-04-25 ENCOUNTER — Ambulatory Visit: Payer: 59 | Attending: Internal Medicine

## 2021-04-25 DIAGNOSIS — Z23 Encounter for immunization: Secondary | ICD-10-CM

## 2021-04-25 MED ORDER — INFLUENZA VAC SPLIT QUAD 0.5 ML IM SUSY
PREFILLED_SYRINGE | INTRAMUSCULAR | 0 refills | Status: DC
  Filled 2021-04-25: qty 0.5, 1d supply, fill #0

## 2021-04-25 MED ORDER — PFIZER COVID-19 VAC BIVALENT 30 MCG/0.3ML IM SUSP
INTRAMUSCULAR | 0 refills | Status: DC
  Filled 2021-04-25: qty 0.3, 1d supply, fill #0

## 2021-04-25 NOTE — Progress Notes (Signed)
   Covid-19 Vaccination Clinic  Name:  MALEEAH CROSSMAN    MRN: [id number] DOB: 23-Feb-1956  04/25/2021  Ms. Norcia was observed post Covid-19 immunization for 15 minutes without incident. She was provided with Vaccine Information Sheet and instruction to access the V-Safe system.   Ms. Hamidi was instructed to call 911 with any severe reactions post vaccine: Difficulty breathing  Swelling of face and throat  A fast heartbeat  A bad rash all over body  Dizziness and weakness

## 2021-04-28 ENCOUNTER — Other Ambulatory Visit (HOSPITAL_BASED_OUTPATIENT_CLINIC_OR_DEPARTMENT_OTHER): Payer: Self-pay

## 2021-04-28 NOTE — Progress Notes (Deleted)
[place] Phone: [phone] Subjective:    I'm seeing this patient by the request  of:  [name], [name], MD  CC:   [facility]:[id number]  03/06/2021 Patient given injection today and tolerated the procedure well.  Discussed icing regimen and home exercises, discussed avoiding certain activities.  Discussed if this continues we may need to consider the possibility of either MRI of the hip or the possibility of another epidural of the back more at the elbow through the L4 level.  Patient declined both of those today but will follow up with me again in 2 months Chronic problem and stable.  Still responds well to the meloxicam.  Patient does take it in 5-day burst when needed.  Discussed which activities to doing which wants to avoid.  Continue to work on core strength.  Follow-up again in 6 weeks.  Updated 04/29/2021 [name] is a 65 y.o. female coming in with complaint of bilateral hip pain and low back pain.  Onset-  Location Duration-  Character- Aggravating factors- Reliving factors-  Therapies tried-  Severity-     Past Medical History:  Diagnosis Date   Basal cell carcinoma    skin cancer   Colon polyps    Diverticulosis    CT   Fibroids 09/07/2013   PONV (postoperative nausea and vomiting)    non recent    S/P hysterectomy with oophorectomy 09/07/2013   Seasonal allergies    SVD (spontaneous vaginal delivery)    x 1   Past Surgical History:  Procedure Laterality Date   ABDOMINAL HYSTERECTOMY     COLONOSCOPY     2 or 3   COLONOSCOPY N/A 01/04/2017   Dr. Oneida Alar: Significant looping of the rectosigmoid colon, 4 mm hyperplastic polyp removed from rectum, internal hemorrhoids.  Next colonoscopy in 5 to 10 years with MAC and ultra slim colonoscope.   COLONOSCOPY WITH PROPOFOL N/A 12/05/2019   Procedure: COLONOSCOPY WITH PROPOFOL;  Surgeon: Danie Binder, MD;  [facility];  Service: Endoscopy;   Laterality: N/A;  7:30am   DILATION AND CURETTAGE OF UTERUS     x 3   EUS N/A 11/16/2013   Dr. Ardis Hughs: parenchyma in tail of pancreas suggests chronic pancreatitis, abrupt changes in main pancreatic duct in tail becomes dilated up to 4-14m, numerous associated dilated side branches, no solid mass. bx most c/w IPMN   EYE SURGERY     lasik bilateral   LAPAROSCOPY N/A 09/25/2013   Procedure: LAPAROSCOPY OPERATIVE;  Surgeon: VElveria Royals MD;  [facility];  Service: Gynecology;  Laterality: N/A;   PANCREATECTOMY N/A 12/07/2013   Procedure: LAPAROSCOPIC DISTAL  PANCREATECTOMY;  Surgeon: FStark Klein MD;  [place] [facility]  Service: General;  Laterality: N/A;   POLYPECTOMY  01/04/2017   Procedure: POLYPECTOMY;  Surgeon: FDanie Binder MD;  [facility];  Service: Endoscopy;;  rectal   POLYPECTOMY  12/05/2019   Procedure: POLYPECTOMY;  Surgeon: FDanie Binder MD;  [facility];  Service: Endoscopy;;   ROBOTIC ASSISTED TOTAL HYSTERECTOMY WITH BILATERAL SALPINGO OOPHERECTOMY Bilateral 09/07/2013   Procedure: ROBOTIC ASSISTED TOTAL HYSTERECTOMY WITH BILATERAL SALPINGO OOPHORECTOMY;  Surgeon: VElveria Royals MD;  [facility];  Service: Gynecology;  Laterality: Bilateral;   WISDOM TOOTH EXTRACTION     Social History   Socioeconomic History   Marital status: Married    Spouse name: Not on file   Number of children:  1   Years of education: Not on file   Highest education level: Not on file  Occupational History   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[employer]    Comment: [place] [employer], VP Advertising account executive  Tobacco Use   Smoking status: Former    Packs/day: 1.00    Years: 10.00    Pack years: 10.00    Types: Cigarettes    Quit date: 01/02/1987    Years since quitting: 34.3   Smokeless tobacco: Never  Vaping Use   Vaping Use: Never used  Substance and Sexual Activity   Alcohol use: Yes    Comment: 2-3 glases of wine on weekend nights   Drug use: No    Sexual activity: Not Currently    Birth control/protection: Post-menopausal, Surgical  Other Topics Concern   Not on file  Social History Narrative   Not on file   Social Determinants of Health   Financial Resource Strain: Not on file  Food Insecurity: Not on file  Transportation Needs: Not on file  Physical Activity: Not on file  Stress: Not on file  Social Connections: Not on file   No Known Allergies Family History  Problem Relation Age of Onset   COPD Mother    Colon cancer Mother    Lung cancer Mother 22   Diabetes Brother    Heart disease Maternal Grandfather    Diverticulitis Brother     Current Outpatient Medications (Endocrine & Metabolic):    estradiol (VIVELLE-DOT) 0.025 MG/24HR, APPLY 1 PATCH TO SKIN TWICE A WEEK   Current Outpatient Medications (Respiratory):    fexofenadine (ALLEGRA) 180 MG tablet, Take 180 mg by mouth daily as needed for allergies or rhinitis.   fexofenadine-pseudoephedrine (ALLEGRA-D) 60-120 MG per tablet, Take 1 tablet by mouth daily.   ipratropium (ATROVENT) 0.06 % nasal spray, INSTILL 1 TO 2 SPRAYS IN EACH NOSTRIL 3-4 TIMES A DAY AS NEEDED   levocetirizine (XYZAL) 5 MG tablet, Take 1 tablet (5 mg total) by mouth daily in the evening   montelukast (SINGULAIR) 10 MG tablet, Take 1 tablet by mouth once daily  Current Outpatient Medications (Analgesics):    meloxicam (MOBIC) 7.5 MG tablet, TAKE 1 TABLET (7.5 MG TOTAL) BY MOUTH DAILY.   Current Outpatient Medications (Other):    ALPRAZolam (XANAX) 0.5 MG tablet, Take 0.5 mg by mouth daily as needed for anxiety.   ascorbic acid (VITAMIN C) 100 MG tablet, Take 100 mg by mouth daily.    Azelaic Acid 15 % cream, APPLY 1 GRAM TOPICALLY ON THE SKIN ONCE DAILY.   b complex vitamins tablet, Take 1 tablet by mouth daily.   Calcium Carb-Cholecalciferol 600-800 MG-UNIT TABS, Take 1 tablet by mouth daily.   COVID-19 At Home Antigen Test [facility] COVID-19 HOME TEST) KIT, Use as directed within  package instructions   COVID-19 mRNA bivalent vaccine, Pfizer, (PFIZER COVID-19 VAC BIVALENT) injection, Inject into the muscle.   cycloSPORINE (RESTASIS) 0.05 % ophthalmic emulsion, PLACE 1 DROP INTO BOTH EYES 2 TIMES DAILY.   Diclofenac Sodium 2 % SOLN, Place 2 g onto the skin 2 (two) times daily. (Patient taking differently: Place 2 g onto the skin 2 (two) times daily as needed (pain).)   dimenhyDRINATE (DRAMAMINE) 50 MG tablet, Take 50 mg by mouth in the morning and at bedtime.   influenza vac split quadrivalent PF (FLUARIX) 0.5 ML injection, Inject into the muscle.   Lactobacillus (ACIDOPHILUS PO), Take 1 capsule by mouth daily.   MELATONIN PO, Take  1 tablet by mouth at bedtime.   Multiple Vitamin (MULTIVITAMIN) tablet, Take 1 tablet by mouth daily.   Omega-3 Fatty Acids (FISH OIL PO), Take 1 capsule by mouth daily.   RESTASIS 0.05 % ophthalmic emulsion, Place 1 drop into both eyes 2 (two) times daily.   tretinoin (RETIN-A) 0.05 % cream, APPLY TOPICALLY TO AFFECTED AREA ON THE SKIN NIGHTLY.   triamcinolone cream (KENALOG) 0.1 %, Apply 1 application topically to affected area on skin 2 (two) times daily as needed for itching.   triamcinolone cream (KENALOG) 0.1 %, Apply 1 application to affected area three times daily for 10 days; do not place on face or genitals   Reviewed prior external information including notes and imaging from  primary care provider As well as notes that were available from care everywhere and other healthcare systems.  Past medical history, social, surgical and family history all reviewed in electronic medical record.  No pertanent information unless stated regarding to the chief complaint.   Review of Systems:  No headache, visual changes, nausea, vomiting, diarrhea, constipation, dizziness, abdominal pain, skin rash, fevers, chills, night sweats, weight loss, swollen lymph nodes, body aches, joint swelling, chest pain, shortness of breath, mood changes. POSITIVE  muscle aches  Objective  There were no vitals taken for this visit.   General: No apparent distress alert and oriented x3 mood and affect normal, dressed appropriately.  HEENT: Pupils equal, extraocular movements intact  Respiratory: Patient's speak in full sentences and does not appear short of breath  Cardiovascular: No lower extremity edema, non tender, no erythema  Gait normal with good balance and coordination.  MSK:  Non tender with full range of motion and good stability and symmetric strength and tone of shoulders, elbows, wrist, hip, knee and ankles bilaterally.     Impression and Recommendations:     The above documentation has been reviewed and is accurate and complete Belva Agee

## 2021-04-29 ENCOUNTER — Ambulatory Visit: Payer: 59 | Admitting: Family Medicine

## 2021-04-30 NOTE — Progress Notes (Signed)
[place] Phone: [phone] Subjective:   [name], am serving as a scribe for Dr. Hulan Saas.  This visit occurred during the SARS-CoV-2 public health emergency.  Safety protocols were in place, including screening questions prior to the visit, additional usage of staff PPE, and extensive cleaning of exam room while observing appropriate contact time as indicated for disinfecting solutions.   I'm seeing this patient by the request  of:  [name], [name], MD  CC: Back and neck pain follow-up  [id number]  KRISTYL ATHENS is a 65 y.o. female coming in with complaint of back and neck pain. OMT 03/06/2021 and f/u for R hip. Patient states that injection helped allevaite her hp pain.  Patient is feeling significantly better overall.  Has been able to bike a lot more frequently and longer durations without as much discomfort.  Staying very active.  Medications patient has been prescribed: Meloxicam  Taking: Intermittently          Past Medical History:  Diagnosis Date   Basal cell carcinoma    skin cancer   Colon polyps    Diverticulosis    CT   Fibroids 09/07/2013   PONV (postoperative nausea and vomiting)    non recent    S/P hysterectomy with oophorectomy 09/07/2013   Seasonal allergies    SVD (spontaneous vaginal delivery)    x 1    No Known Allergies   Review of Systems:  No headache, visual changes, nausea, vomiting, diarrhea, constipation, dizziness, abdominal pain, skin rash, fevers, chills, night sweats, weight loss, swollen lymph nodes, body aches, joint swelling, chest pain, shortness of breath, mood changes. POSITIVE muscle aches  Objective  Blood pressure 126/78, pulse 62, height 5\' 5"  (1.651 m).   General: No apparent distress alert and oriented x3 mood and affect normal, dressed appropriately.  HEENT: Pupils equal, extraocular movements intact  Respiratory: Patient's speak in  full sentences and does not appear short of breath  Cardiovascular: No lower extremity edema, non tender, no erythema  Low back exam still has some mild tightness noted in the paraspinal musculature mostly of the thoracolumbar and lumbosacral areas.  Patient has very mild tightness of the FABER test right side.  Negative straight leg test.  Still some decrease in extension of the back.  Osteopathic findings  C6 flexed rotated and side bent left T3 extended rotated and side bent right inhaled rib T9 extended rotated and side bent left L2 flexed rotated and side bent right Sacrum right on right       Assessment and Plan:  Low back pain Patient is responding significantly better to the epidural in the back and likely this is more the primary concern than the greater trochanteric bursitis.  Discussed with patient about icing regimen and home exercises.  Increase activity slowly.  Patient has no significant restrictions at this time and will follow up with me again in 2 months   Nonallopathic problems  Decision today to treat with OMT was based on Physical Exam  After verbal consent patient was treated with HVLA, ME, FPR techniques in cervical, rib, thoracic, lumbar, and sacral  areas  Patient tolerated the procedure well with improvement in symptoms  Patient given exercises, stretches and lifestyle modifications  See medications in patient instructions if given  Patient will follow up in 4-8 weeks      The above documentation has been reviewed and is accurate and complete Olevia Bowens  Tamala Julian, DO        Note: This dictation was prepared with Dragon dictation along with smaller phrase technology. Any transcriptional errors that result from this process are unintentional.

## 2021-05-01 ENCOUNTER — Other Ambulatory Visit: Payer: Self-pay

## 2021-05-01 ENCOUNTER — Encounter: Payer: Self-pay | Admitting: Family Medicine

## 2021-05-01 ENCOUNTER — Ambulatory Visit: Payer: 59 | Admitting: Family Medicine

## 2021-05-01 ENCOUNTER — Ambulatory Visit: Payer: Self-pay

## 2021-05-01 VITALS — BP 126/78 | HR 62 | Ht 65.0 in

## 2021-05-01 DIAGNOSIS — M9903 Segmental and somatic dysfunction of lumbar region: Secondary | ICD-10-CM

## 2021-05-01 DIAGNOSIS — M9908 Segmental and somatic dysfunction of rib cage: Secondary | ICD-10-CM | POA: Diagnosis not present

## 2021-05-01 DIAGNOSIS — M25551 Pain in right hip: Secondary | ICD-10-CM

## 2021-05-01 DIAGNOSIS — G8929 Other chronic pain: Secondary | ICD-10-CM | POA: Diagnosis not present

## 2021-05-01 DIAGNOSIS — M9902 Segmental and somatic dysfunction of thoracic region: Secondary | ICD-10-CM | POA: Diagnosis not present

## 2021-05-01 DIAGNOSIS — M545 Low back pain, unspecified: Secondary | ICD-10-CM | POA: Diagnosis not present

## 2021-05-01 DIAGNOSIS — M9901 Segmental and somatic dysfunction of cervical region: Secondary | ICD-10-CM | POA: Diagnosis not present

## 2021-05-01 DIAGNOSIS — M9904 Segmental and somatic dysfunction of sacral region: Secondary | ICD-10-CM

## 2021-05-01 NOTE — Patient Instructions (Signed)
See me in 2 months Have fun in [place]

## 2021-05-01 NOTE — Assessment & Plan Note (Signed)
Patient is responding significantly better to the epidural in the back and likely this is more the primary concern than the greater trochanteric bursitis.  Discussed with patient about icing regimen and home exercises.  Increase activity slowly.  Patient has no significant restrictions at this time and will follow up with me again in 2 months

## 2021-05-02 ENCOUNTER — Other Ambulatory Visit (HOSPITAL_BASED_OUTPATIENT_CLINIC_OR_DEPARTMENT_OTHER): Payer: Self-pay

## 2021-05-13 ENCOUNTER — Encounter: Payer: Self-pay | Admitting: Gastroenterology

## 2021-05-13 ENCOUNTER — Ambulatory Visit (INDEPENDENT_AMBULATORY_CARE_PROVIDER_SITE_OTHER): Payer: 59 | Admitting: Gastroenterology

## 2021-05-13 VITALS — BP 118/72 | HR 63 | Ht 65.0 in | Wt 159.0 lb

## 2021-05-13 DIAGNOSIS — Z86010 Personal history of colonic polyps: Secondary | ICD-10-CM

## 2021-05-13 DIAGNOSIS — K580 Irritable bowel syndrome with diarrhea: Secondary | ICD-10-CM | POA: Diagnosis not present

## 2021-05-13 DIAGNOSIS — Z90411 Acquired partial absence of pancreas: Secondary | ICD-10-CM

## 2021-05-13 DIAGNOSIS — Z800 Family history of malignant neoplasm of digestive organs: Secondary | ICD-10-CM

## 2021-05-13 DIAGNOSIS — K602 Anal fissure, unspecified: Secondary | ICD-10-CM

## 2021-05-13 DIAGNOSIS — K641 Second degree hemorrhoids: Secondary | ICD-10-CM | POA: Diagnosis not present

## 2021-05-13 MED ORDER — AMBULATORY NON FORMULARY MEDICATION: 0 refills | Status: DC

## 2021-05-13 MED ORDER — RIFAXIMIN 550 MG PO TABS: 550.0000 mg | ORAL_TABLET | Freq: Three times a day (TID) | ORAL | 0 refills | Status: DC

## 2021-05-13 NOTE — Patient Instructions (Addendum)
We have give you CREON samples today  Take Xifaxian 550 mg three times a day for 14 days   We have sent a prescription for nitroglycerin 0.125% gel to [facility] You should apply a pea size amount to your rectum three times daily x 6-8 weeks.  [facility]'s information is below: Address: [address], [place], [place] [address]  [phone]  *Please DO NOT go directly from our office to pick up this medication! Give the pharmacy 1 day to process the prescription as this is compounded and takes time to make.   We have sent your demographic information and a prescription for Xifaxan to [facility]. This pharmacy is able to get medication approved through insurance and get you the lowest copay possible. If you have not heard from them within 1 week, please call our office at [phone] to let us know.   If you are age 64 or older, your body mass index should be between 23-30. Your Body mass index is 26.46 kg/m. If this is out of the aforementioned range listed, please consider follow up with your Primary Care Provider.  If you are age 81 or younger, your body mass index should be between 19-25. Your Body mass index is 26.46 kg/m. If this is out of the aformentioned range listed, please consider follow up with your Primary Care Provider.   __________________________________________________________  The [place] [facility] [facility] providers would like to encourage you to use [facility] to communicate with providers for non-urgent requests or questions.  Due to long hold times on the telephone, sending your provider a message by [facility] may be a faster and more efficient way to get a response.  Please allow 48 business hours for a response.  Please remember that this is for non-urgent requests.           I appreciate the  opportunity to care for you  Thank You   Harl Bowie , MD

## 2021-05-13 NOTE — Progress Notes (Signed)
[name]    [id number]    1956-06-04  Primary Care Physician:[name], Steva Ready, MD  Referring Physician: Prince Solian, MD [address] [place],   [id number]   Chief complaint:  Hemorrhoids, change in bowel habits  HPI: 65 year old very pleasant female here for follow-up visit with complaints of change in bowel habits and hemorrhoids.  She was followed by Dr. 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  On average she has 1-2 bowel movements followed by 3-4 bowel movements the next day associated with fecal urgency.  She has intermittent bright red blood per rectum.  Occasionally she has hard stool with constipation.  She has rectal discomfort and burning sensation, feels from hemorrhoids, exacerbated after she has episodes of diarrhea Denies any nocturnal diarrhea  colonoscopy May 2021 by Dr. Oneida Alar   3 mm tubular adenoma removed from cecum, diverticulosis and internal hemorrhoids  Family history of colon cancer in mother  CT abdomen pelvis with contrast January 2021: Negative for any acute pathology, no new pancreatic cysts or lesions.  Outpatient Encounter Medications as of 05/13/2021  Medication Sig   ALPRAZolam (XANAX) 0.5 MG tablet Take 0.5 mg by mouth daily as needed for anxiety.   ascorbic acid (VITAMIN C) 100 MG tablet Take 100 mg by mouth daily.    Azelaic Acid 15 % cream APPLY 1 GRAM TOPICALLY ON THE SKIN ONCE DAILY.   b complex vitamins tablet Take 1 tablet by mouth daily.   Calcium Carb-Cholecalciferol 600-800 MG-UNIT TABS Take 1 tablet by mouth daily.   COVID-19 At Home Antigen Test [facility] COVID-19 HOME TEST) KIT Use as directed within package instructions   COVID-19 mRNA bivalent vaccine, Pfizer, (PFIZER COVID-19 VAC BIVALENT) injection Inject into the muscle.   cycloSPORINE (RESTASIS)  0.05 % ophthalmic emulsion PLACE 1 DROP INTO BOTH EYES 2 TIMES DAILY.   Diclofenac Sodium 2 % SOLN Place 2 g onto the skin 2 (two) times daily. (Patient taking differently: Place 2 g onto the skin 2 (two) times daily as needed (pain).)   dimenhyDRINATE (DRAMAMINE) 50 MG tablet Take 50 mg by mouth in the morning and at bedtime.   estradiol (VIVELLE-DOT) 0.025 MG/24HR APPLY 1 PATCH TO SKIN TWICE A WEEK   fexofenadine (ALLEGRA) 180 MG tablet Take 180 mg by mouth daily as needed for allergies or rhinitis.   fexofenadine-pseudoephedrine (ALLEGRA-D) 60-120 MG per tablet Take 1 tablet by mouth daily.   influenza vac split quadrivalent PF (FLUARIX) 0.5 ML injection Inject into the muscle.   ipratropium (ATROVENT) 0.06 % nasal spray INSTILL 1 TO 2 SPRAYS IN EACH NOSTRIL 3-4 TIMES A DAY AS NEEDED   Lactobacillus (ACIDOPHILUS PO) Take 1 capsule by mouth daily.   levocetirizine (XYZAL) 5 MG tablet Take 1 tablet (5 mg total) by mouth daily in the evening   MELATONIN PO Take 1 tablet by mouth at bedtime.   meloxicam (MOBIC) 7.5 MG tablet TAKE 1 TABLET (7.5 MG TOTAL) BY MOUTH DAILY.   montelukast (SINGULAIR) 10 MG tablet Take 1 tablet by mouth once daily   Multiple Vitamin (MULTIVITAMIN) tablet Take 1 tablet by mouth daily.   Omega-3 Fatty Acids (FISH OIL PO) Take 1 capsule by mouth daily.   RESTASIS 0.05 % ophthalmic emulsion Place 1 drop into both eyes 2 (two) times daily.   tretinoin (  RETIN-A) 0.05 % cream APPLY TOPICALLY TO AFFECTED AREA ON THE SKIN NIGHTLY.   triamcinolone cream (KENALOG) 0.1 % Apply 1 application to affected area three times daily for 10 days; do not place on face or genitals   [DISCONTINUED] triamcinolone cream (KENALOG) 0.1 % Apply 1 application topically to affected area on skin 2 (two) times daily as needed for itching.   No facility-administered encounter medications on file as of 05/13/2021.    Allergies as of 05/13/2021   (No Known Allergies)    Past Medical History:   Diagnosis Date   Basal cell carcinoma    skin cancer   Colon polyps    Diverticulosis    CT   Fibroids 09/07/2013   PONV (postoperative nausea and vomiting)    non recent    S/P hysterectomy with oophorectomy 09/07/2013   Seasonal allergies    SVD (spontaneous vaginal delivery)    x 1    Past Surgical History:  Procedure Laterality Date   ABDOMINAL HYSTERECTOMY     COLONOSCOPY     2 or 3   COLONOSCOPY N/A 01/04/2017   Dr. Oneida Alar: Significant looping of the rectosigmoid colon, 4 mm hyperplastic polyp removed from rectum, internal hemorrhoids.  Next colonoscopy in 5 to 10 years with MAC and ultra slim colonoscope.   COLONOSCOPY WITH PROPOFOL N/A 12/05/2019   Procedure: COLONOSCOPY WITH PROPOFOL;  Surgeon: Danie Binder, MD;  [facility];  Service: Endoscopy;  Laterality: N/A;  7:30am   DILATION AND CURETTAGE OF UTERUS     x 3   EUS N/A 11/16/2013   Dr. Ardis Hughs: parenchyma in tail of pancreas suggests chronic pancreatitis, abrupt changes in main pancreatic duct in tail becomes dilated up to 4-68m, numerous associated dilated side branches, no solid mass. bx most c/w IPMN   EYE SURGERY     lasik bilateral   LAPAROSCOPY N/A 09/25/2013   Procedure: LAPAROSCOPY OPERATIVE;  Surgeon: VElveria Royals MD;  [facility];  Service: Gynecology;  Laterality: N/A;   PANCREATECTOMY N/A 12/07/2013   Procedure: LAPAROSCOPIC DISTAL  PANCREATECTOMY;  Surgeon: FStark Klein MD;  [facility]  Service: General;  Laterality: N/A;   POLYPECTOMY  01/04/2017   Procedure: POLYPECTOMY;  Surgeon: FDanie Binder MD;  [facility];  Service: Endoscopy;;  rectal   POLYPECTOMY  12/05/2019   Procedure: POLYPECTOMY;  Surgeon: FDanie Binder MD;  [facility];  Service: Endoscopy;;   ROBOTIC ASSISTED TOTAL HYSTERECTOMY WITH BILATERAL SALPINGO OOPHERECTOMY Bilateral 09/07/2013   Procedure: ROBOTIC ASSISTED TOTAL HYSTERECTOMY WITH BILATERAL SALPINGO OOPHORECTOMY;  Surgeon: VElveria Royals MD;  [facility];  Service: Gynecology;  Laterality: Bilateral;   WISDOM TOOTH EXTRACTION      Family History  Problem Relation Age of Onset   COPD Mother    Colon cancer Mother    Lung cancer Mother [id number]  Diabetes Brother    Diverticulitis Brother    Heart disease Maternal Grandfather    Stomach cancer Neg Hx    Esophageal cancer Neg Hx    Pancreatic cancer Neg Hx     Social History   Socioeconomic History   Marital status: Married    Spouse name: Not on file   Number of children: 1   Years of education: Not on file   Highest education level: Not on file  Occupational History   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[employer]  Tobacco Use   Smoking status: Former    Packs/day: 1.00    Years: 10.00    Pack years: 10.00    Types: Cigarettes    Quit date: 01/02/1987    Years since quitting: 34.3   Smokeless tobacco: Never  Vaping Use   Vaping Use: Never used  Substance and Sexual Activity   Alcohol use: Yes    Comment: 2-3 glases of wine on weekend nights   Drug use: No   Sexual activity: Not Currently    Birth control/protection: Post-menopausal, Surgical  Other Topics Concern   Not on file  Social History Narrative   Not on file   Social Determinants of Health   Financial Resource Strain: Not on file  Food Insecurity: Not on file  Transportation Needs: Not on file  Physical Activity: Not on file  Stress: Not on file  Social Connections: Not on file  Intimate Partner Violence: Not on file      Review of systems: All other review of systems negative except as mentioned in the HPI.   Physical Exam: Vitals:   05/13/21 0834  BP: 118/72  Pulse: 63   Body mass index is 26.46 kg/m. Gen:      No acute distress HEENT:  sclera anicteric Abd:      soft, non-tender; no palpable masses, no distension Ext:    No edema Neuro: alert and oriented x 3 Psych: normal mood and affect Rectal exam:  Normal anal sphincter tone, no  external hemorrhoids Anoscopy: Posterior anal fissure, anorectal tenderness, Small internal hemorrhoids, no active bleeding, normal dentate line, no visible nodules   Data Reviewed:  Reviewed labs, radiology imaging, old records and pertinent past GI work up   Assessment and Plan/Recommendations:  65 year old very pleasant female with complaints of change in bowel habits, intermittent diarrhea likely irritable bowel syndrome predominant diarrhea Will need to exclude exocrine pancreatic insufficiency though had normal fecal elastase Will do empiric trial of Creon for 3 to 4 days  If no improvement, will consider course of Xifaxan 550 mg 3 times daily for 14 days for possible SIBO associated IBS diarrhea  Anal fissure on rectal exam: Use nitroglycerin 0.125% 3 times daily for 6 to 8 weeks Avoid excessive straining during defecation  Pancreatic IPMN s/p distal pancreatectomy 2015.  She has been getting surveillance CT early, no new pancreatic lesions, overall low risk.  We will hold off on going surveillance CT due to increased radiation burden.  May consider MRI/MRCP in the future if has any change in symptoms  Personal history of colon polyps and family history of colon cancer: Due for recall colonoscopy May 2026  Return in 2 months or sooner if needed  This visit required 40 minutes of patient care (this includes precharting, chart review, review of results, face-to-face time used for counseling as well as treatment plan and follow-up. The patient was provided an opportunity to ask questions and all were answered. The patient agreed with the plan and demonstrated an understanding of the instructions.  Damaris Hippo , MD    CC: Prince Solian, MD

## 2021-05-15 ENCOUNTER — Other Ambulatory Visit (HOSPITAL_BASED_OUTPATIENT_CLINIC_OR_DEPARTMENT_OTHER): Payer: Self-pay

## 2021-05-15 MED FILL — Ipratropium Bromide Nasal Soln 0.06% (42 MCG/SPRAY): NASAL | 20 days supply | Qty: 15 | Fill #1 | Status: AC

## 2021-05-19 ENCOUNTER — Encounter: Payer: Self-pay | Admitting: Family Medicine

## 2021-05-19 DIAGNOSIS — F4323 Adjustment disorder with mixed anxiety and depressed mood: Secondary | ICD-10-CM | POA: Diagnosis not present

## 2021-05-20 ENCOUNTER — Other Ambulatory Visit: Payer: Self-pay

## 2021-05-20 DIAGNOSIS — M545 Low back pain, unspecified: Secondary | ICD-10-CM

## 2021-05-20 DIAGNOSIS — M25552 Pain in left hip: Secondary | ICD-10-CM

## 2021-05-20 DIAGNOSIS — M25551 Pain in right hip: Secondary | ICD-10-CM

## 2021-05-27 DIAGNOSIS — F4323 Adjustment disorder with mixed anxiety and depressed mood: Secondary | ICD-10-CM | POA: Diagnosis not present

## 2021-06-02 ENCOUNTER — Ambulatory Visit
Admission: RE | Admit: 2021-06-02 | Discharge: 2021-06-02 | Disposition: A | Discharge status: Home / Self Care | Payer: 59 | Source: Ambulatory Visit | Attending: Family Medicine | Admitting: Family Medicine

## 2021-06-02 DIAGNOSIS — M160 Bilateral primary osteoarthritis of hip: Secondary | ICD-10-CM | POA: Diagnosis not present

## 2021-06-02 DIAGNOSIS — M25552 Pain in left hip: Secondary | ICD-10-CM

## 2021-06-02 DIAGNOSIS — J300 Vasomotor rhinitis: Secondary | ICD-10-CM | POA: Diagnosis not present

## 2021-06-02 DIAGNOSIS — S76311A Strain of muscle, fascia and tendon of the posterior muscle group at thigh level, right thigh, initial encounter: Secondary | ICD-10-CM | POA: Diagnosis not present

## 2021-06-02 DIAGNOSIS — K219 Gastro-esophageal reflux disease without esophagitis: Secondary | ICD-10-CM | POA: Diagnosis not present

## 2021-06-02 DIAGNOSIS — J3089 Other allergic rhinitis: Secondary | ICD-10-CM | POA: Diagnosis not present

## 2021-06-02 DIAGNOSIS — M25551 Pain in right hip: Secondary | ICD-10-CM

## 2021-06-02 DIAGNOSIS — M48061 Spinal stenosis, lumbar region without neurogenic claudication: Secondary | ICD-10-CM | POA: Diagnosis not present

## 2021-06-02 DIAGNOSIS — M545 Low back pain, unspecified: Secondary | ICD-10-CM

## 2021-06-02 DIAGNOSIS — H1045 Other chronic allergic conjunctivitis: Secondary | ICD-10-CM | POA: Diagnosis not present

## 2021-06-02 MED ORDER — GADOBENATE DIMEGLUMINE 529 MG/ML IV SOLN
14.0000 mL | Freq: Once | INTRAVENOUS | Status: AC | PRN
  Administered 2021-06-02: 14 mL via INTRAVENOUS

## 2021-06-04 DIAGNOSIS — R350 Frequency of micturition: Secondary | ICD-10-CM | POA: Diagnosis not present

## 2021-06-10 ENCOUNTER — Other Ambulatory Visit (HOSPITAL_BASED_OUTPATIENT_CLINIC_OR_DEPARTMENT_OTHER): Payer: Self-pay

## 2021-06-10 MED ORDER — NITROFURANTOIN MONOHYD MACRO 100 MG PO CAPS
ORAL_CAPSULE | ORAL | 0 refills | Status: DC
  Filled 2021-06-10: qty 10, 5d supply, fill #0

## 2021-06-11 DIAGNOSIS — L989 Disorder of the skin and subcutaneous tissue, unspecified: Secondary | ICD-10-CM | POA: Diagnosis not present

## 2021-06-11 DIAGNOSIS — W57XXXA Bitten or stung by nonvenomous insect and other nonvenomous arthropods, initial encounter: Secondary | ICD-10-CM | POA: Diagnosis not present

## 2021-06-11 DIAGNOSIS — Z91038 Other insect allergy status: Secondary | ICD-10-CM | POA: Diagnosis not present

## 2021-06-12 NOTE — Progress Notes (Signed)
[place] Phone: [phone] Subjective:   [name], am serving as a scribe for Dr. Hulan Saas. This visit occurred during the SARS-CoV-2 public health emergency.  Safety protocols were in place, including screening questions prior to the visit, additional usage of staff PPE, and extensive cleaning of exam room while observing appropriate contact time as indicated for disinfecting solutions.   I'm seeing this patient by the request  of:  [name], [name], MD  CC: Gluteal pain and back pain  [id number]  05/01/2021 Patient is responding significantly better to the epidural in the back and likely this is more the primary concern than the greater trochanteric bursitis.  Discussed with patient about icing regimen and home exercises.  Increase activity slowly.  Patient has no significant restrictions at this time and will follow up with me again in 2 months  Updated 06/17/2021 ELISKA HAMIL is a 65 y.o. female coming in with complaint of right hip pain. Here for PRP. Patient is having lateral R hip.   MRI IMPRESSION: 1. 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 No residual impingement. 2. Unchanged mild spinal canal stenosis at L3-L4. MRI though of the gluteal region shows partial tearing noted of the gluteus minimus.  Patient also has reactive bursitis noted.     Past Medical History:  Diagnosis Date   Basal cell carcinoma    skin cancer   Colon polyps    Diverticulosis    CT   Fibroids 09/07/2013   PONV (postoperative nausea and vomiting)    non recent    S/P hysterectomy with oophorectomy 09/07/2013   Seasonal allergies    SVD (spontaneous vaginal delivery)    x 1   Past Surgical History:  Procedure Laterality Date   ABDOMINAL HYSTERECTOMY     COLONOSCOPY     2 or 3   COLONOSCOPY N/A 01/04/2017   Dr. Oneida Alar: Significant looping of the rectosigmoid colon, 4 mm hyperplastic  polyp removed from rectum, internal hemorrhoids.  Next colonoscopy in 5 to 10 years with MAC and ultra slim colonoscope.   COLONOSCOPY WITH PROPOFOL N/A 12/05/2019   Procedure: COLONOSCOPY WITH PROPOFOL;  Surgeon: Danie Binder, MD;  [facility];  Service: Endoscopy;  Laterality: N/A;  7:30am   DILATION AND CURETTAGE OF UTERUS     x 3   EUS N/A 11/16/2013   Dr. Ardis Hughs: parenchyma in tail of pancreas suggests chronic pancreatitis, abrupt changes in main pancreatic duct in tail becomes dilated up to 4-31m, numerous associated dilated side branches, no solid mass. bx most c/w IPMN   EYE SURGERY     lasik bilateral   LAPAROSCOPY N/A 09/25/2013   Procedure: LAPAROSCOPY OPERATIVE;  Surgeon: VElveria Royals MD;  [facility];  Service: Gynecology;  Laterality: N/A;   PANCREATECTOMY N/A 12/07/2013   Procedure: LAPAROSCOPIC DISTAL  PANCREATECTOMY;  Surgeon: FStark Klein MD;  [facility]  Service: General;  Laterality: N/A;   POLYPECTOMY  01/04/2017   Procedure: POLYPECTOMY;  Surgeon: FDanie Binder MD;  [facility];  Service: Endoscopy;;  rectal   POLYPECTOMY  12/05/2019   Procedure: POLYPECTOMY;  Surgeon: FDanie Binder MD;  [facility];  Service: Endoscopy;;   ROBOTIC ASSISTED TOTAL HYSTERECTOMY WITH BILATERAL SALPINGO OOPHERECTOMY Bilateral 09/07/2013   Procedure: ROBOTIC ASSISTED TOTAL HYSTERECTOMY WITH BILATERAL SALPINGO OOPHORECTOMY;  Surgeon: VElveria Royals MD;  [facility];  Service: Gynecology;  Laterality: Bilateral;   WISDOM TOOTH EXTRACTION     Social History   Socioeconomic History   Marital status: Married    Spouse name: Not on file   Number of children: 1   Years of education: Not on file   Highest education level: Not on file  Occupational History   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[employer], VP Advertising account executive  Tobacco Use   Smoking status: Former    Packs/day: 1.00    Years: 10.00    Pack  years: 10.00    Types: Cigarettes    Quit date: 01/02/1987    Years since quitting: 34.4   Smokeless tobacco: Never  Vaping Use   Vaping Use: Never used  Substance and Sexual Activity   Alcohol use: Yes    Comment: 2-3 glases of wine on weekend nights   Drug use: No   Sexual activity: Not Currently    Birth control/protection: Post-menopausal, Surgical  Other Topics Concern   Not on file  Social History Narrative   Not on file   Social Determinants of Health   Financial Resource Strain: Not on file  Food Insecurity: Not on file  Transportation Needs: Not on file  Physical Activity: Not on file  Stress: Not on file  Social Connections: Not on file   No Known Allergies Family History  Problem Relation Age of Onset   COPD Mother    Colon cancer Mother    Lung cancer Mother 28   Diabetes Brother    Diverticulitis Brother    Heart disease Maternal Grandfather    Stomach cancer Neg Hx    Esophageal cancer Neg Hx    Pancreatic cancer Neg Hx     Current Outpatient Medications (Endocrine & Metabolic):    estradiol (VIVELLE-DOT) 0.025 MG/24HR, APPLY 1 PATCH TO SKIN TWICE A WEEK   Current Outpatient Medications (Respiratory):    fexofenadine (ALLEGRA) 180 MG tablet, Take 180 mg by mouth daily as needed for allergies or rhinitis.   fexofenadine-pseudoephedrine (ALLEGRA-D) 60-120 MG per tablet, Take 1 tablet by mouth daily.   ipratropium (ATROVENT) 0.06 % nasal spray, INSTILL 1 TO 2 SPRAYS IN EACH NOSTRIL 3-4 TIMES A DAY AS NEEDED   levocetirizine (XYZAL) 5 MG tablet, Take 1 tablet (5 mg total) by mouth daily in the evening   montelukast (SINGULAIR) 10 MG tablet, Take 1 tablet by mouth once daily  Current Outpatient Medications (Analgesics):    meloxicam (MOBIC) 7.5 MG tablet, TAKE 1 TABLET (7.5 MG TOTAL) BY MOUTH DAILY.   Current Outpatient Medications (Other):    ALPRAZolam (XANAX) 0.5 MG tablet, Take 0.5 mg by mouth daily as needed for anxiety.   AMBULATORY NON FORMULARY  MEDICATION, Medication Name: Nitroglycerin ointment 0.125% use pea sized amount three times a day for 6-8 weeks   ascorbic acid (VITAMIN C) 100 MG tablet, Take 100 mg by mouth daily.    Azelaic Acid 15 % cream, APPLY 1 GRAM TOPICALLY ON THE SKIN ONCE DAILY.   b complex vitamins tablet, Take 1 tablet by mouth daily.   Calcium Carb-Cholecalciferol 600-800 MG-UNIT TABS, Take 1 tablet by mouth daily.   COVID-19 At Home Antigen Test [facility] COVID-19 HOME TEST) KIT, Use as directed within package instructions   COVID-19 mRNA bivalent vaccine, Pfizer, (PFIZER COVID-19 VAC BIVALENT) injection, Inject into the muscle.   cycloSPORINE (RESTASIS) 0.05 % ophthalmic emulsion, PLACE 1 DROP INTO BOTH EYES 2 TIMES DAILY.   Diclofenac Sodium  2 % SOLN, Place 2 g onto the skin 2 (two) times daily. (Patient taking differently: Place 2 g onto the skin 2 (two) times daily as needed (pain).)   dimenhyDRINATE (DRAMAMINE) 50 MG tablet, Take 50 mg by mouth in the morning and at bedtime.   influenza vac split quadrivalent PF (FLUARIX) 0.5 ML injection, Inject into the muscle.   Lactobacillus (ACIDOPHILUS PO), Take 1 capsule by mouth daily.   MELATONIN PO, Take 1 tablet by mouth at bedtime.   Multiple Vitamin (MULTIVITAMIN) tablet, Take 1 tablet by mouth daily.   nitrofurantoin, macrocrystal-monohydrate, (MACROBID) 100 MG capsule, Take 1 capsule twice a day by oral route for 5 days.   Omega-3 Fatty Acids (FISH OIL PO), Take 1 capsule by mouth daily.   RESTASIS 0.05 % ophthalmic emulsion, Place 1 drop into both eyes 2 (two) times daily.   rifaximin (XIFAXAN) 550 MG TABS tablet, Take 1 tablet (550 mg total) by mouth 3 (three) times daily.   tretinoin (RETIN-A) 0.05 % cream, APPLY TOPICALLY TO AFFECTED AREA ON THE SKIN NIGHTLY.   triamcinolone cream (KENALOG) 0.1 %, Apply 1 application to affected area three times daily for 10 days; do not place on face or genitals     Objective  Blood pressure 112/70, pulse 69, height  _0  (1.651 m), SpO2 99 %.   General: No apparent distress alert and oriented x3 mood and affect normal, dressed appropriately.    MSK:   Procedure: Real-time Ultrasound Guided Injection of right gluteal tendon sheath Device: GE Logiq Q7 Ultrasound guided injection is preferred based studies that show increased duration, increased effect, greater accuracy, decreased procedural pain, increased response rate, and decreased cost with ultrasound guided versus blind injection.  Verbal informed consent obtained.  Time-out conducted.  Noted no overlying erythema, induration, or other signs of local infection.  Skin prepped in a sterile fashion.  Local anesthesia: Topical Ethyl chloride.  With sterile technique and under real time ultrasound guidance: With a 21-gauge 2 inch needle injected with 0.5 cc of 0.5% Marcaine then injected with 5 cc of precentrifuge PRP tendon sheath. Completed without difficulty  Pain immediately improved suggesting accurate placement of the medication.  Advised to call if fevers/chills, erythema, induration, drainage, or persistent bleeding.  Impression: Technically successful ultrasound guided injection.   Impression and Recommendations:     The above documentation has been reviewed and is accurate and complete [name], DO

## 2021-06-17 ENCOUNTER — Other Ambulatory Visit: Payer: Self-pay

## 2021-06-17 ENCOUNTER — Ambulatory Visit: Payer: Self-pay | Admitting: Family Medicine

## 2021-06-17 ENCOUNTER — Encounter: Payer: Self-pay | Admitting: Family Medicine

## 2021-06-17 ENCOUNTER — Ambulatory Visit: Payer: Self-pay

## 2021-06-17 VITALS — BP 112/70 | HR 69 | Ht 65.0 in

## 2021-06-17 DIAGNOSIS — M25551 Pain in right hip: Secondary | ICD-10-CM

## 2021-06-17 DIAGNOSIS — F4323 Adjustment disorder with mixed anxiety and depressed mood: Secondary | ICD-10-CM | POA: Diagnosis not present

## 2021-06-17 DIAGNOSIS — M7601 Gluteal tendinitis, right hip: Secondary | ICD-10-CM

## 2021-06-17 NOTE — Assessment & Plan Note (Signed)
Patient given PRP with post PRP injection protocol given.  Discussed with patient about increasing activity slowly.  Patient will be traveling for the holidays and hopefully will have some time to heal.  Follow-up with me again in 6 weeks

## 2021-06-17 NOTE — Patient Instructions (Addendum)
No ICE or IBU for 3 days Enjoy [place]! Heat and Tylenol are ok See me again in 6 weeks

## 2021-07-07 DIAGNOSIS — E785 Hyperlipidemia, unspecified: Secondary | ICD-10-CM | POA: Diagnosis not present

## 2021-07-07 DIAGNOSIS — M1990 Unspecified osteoarthritis, unspecified site: Secondary | ICD-10-CM | POA: Diagnosis not present

## 2021-07-08 ENCOUNTER — Ambulatory Visit: Payer: 59 | Admitting: Gastroenterology

## 2021-07-08 ENCOUNTER — Other Ambulatory Visit: Payer: 59

## 2021-07-08 ENCOUNTER — Telehealth: Payer: Self-pay | Admitting: *Deleted

## 2021-07-08 ENCOUNTER — Other Ambulatory Visit (HOSPITAL_BASED_OUTPATIENT_CLINIC_OR_DEPARTMENT_OTHER): Payer: Self-pay

## 2021-07-08 ENCOUNTER — Encounter: Payer: Self-pay | Admitting: Gastroenterology

## 2021-07-08 VITALS — BP 130/80 | HR 69 | Ht 65.0 in | Wt 162.0 lb

## 2021-07-08 DIAGNOSIS — K602 Anal fissure, unspecified: Secondary | ICD-10-CM

## 2021-07-08 DIAGNOSIS — K580 Irritable bowel syndrome with diarrhea: Secondary | ICD-10-CM | POA: Diagnosis not present

## 2021-07-08 MED ORDER — AMBULATORY NON FORMULARY MEDICATION: 0 refills | Status: DC

## 2021-07-08 MED ORDER — RIFAXIMIN 550 MG PO TABS
550.0000 mg | ORAL_TABLET | Freq: Three times a day (TID) | ORAL | 0 refills | Status: DC
  Filled 2021-07-08: qty 42, 14d supply, fill #0

## 2021-07-08 NOTE — Progress Notes (Signed)
[name]    [id number]    September 16, 1955  Primary Care Physician:[name], Steva Ready, MD  Referring Physician: Prince Solian, MD [address] [place],  [place] [address]   Chief complaint:  IBS-D  HPI:  65 year old very pleasant female here for follow-up visit for diarrhea  No improvement of diarrhea with trial of Creon.   She has not received the prescription of Xifaxan was sent to the pharmacy.  She had an episode of UTI earlier last month, was experiencing left lower quadrant abdominal discomfort, improved after she took antibiotics.  Diarrhea was worse with watery liquid stool 4-5 times a day around Thanksgiving when she was traveling, her bowel habits are back to baseline with 2-4 semiformed bowel movements.  No longer having watery diarrhea.  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 Oneida Alar   3 mm tubular adenoma removed from cecum, diverticulosis and internal hemorrhoids   Family history of colon cancer in mother   CT abdomen pelvis with contrast January 2021: Negative for any acute pathology, no new pancreatic cysts or lesions.   Outpatient Encounter Medications as of 07/08/2021  Medication Sig   AMBULATORY NON FORMULARY MEDICATION Medication Name: Nitroglycerin ointment 0.125% use pea sized amount three times a day for 6-8 weeks   ascorbic acid (VITAMIN C) 100 MG tablet Take 100 mg by mouth daily.    Azelaic Acid 15 % cream APPLY 1 GRAM TOPICALLY ON THE SKIN ONCE DAILY.   b complex vitamins tablet Take 1 tablet by mouth daily.   Calcium Carb-Cholecalciferol 600-800 MG-UNIT TABS Take 1 tablet by mouth daily.   COVID-19 At Home Antigen Test [facility] COVID-19 HOME TEST) KIT Use as directed within package instructions   COVID-19 mRNA bivalent vaccine, Pfizer, (PFIZER COVID-19 VAC BIVALENT)  injection Inject into the muscle.   cycloSPORINE (RESTASIS) 0.05 % ophthalmic emulsion PLACE 1 DROP INTO BOTH EYES 2 TIMES DAILY.   Diclofenac Sodium 2 % SOLN Place 2 g onto the skin 2 (two) times daily. (Patient taking differently: Place 2 g onto the skin 2 (two) times daily as needed (pain).)   dimenhyDRINATE (DRAMAMINE) 50 MG tablet Take 50 mg by mouth in the morning and at bedtime.   estradiol (VIVELLE-DOT) 0.025 MG/24HR APPLY 1 PATCH TO SKIN TWICE A WEEK   fexofenadine (ALLEGRA) 180 MG tablet Take 180 mg by mouth daily as needed for allergies or rhinitis.   fexofenadine-pseudoephedrine (ALLEGRA-D) 60-120 MG per tablet Take 1 tablet by mouth daily.   influenza vac split quadrivalent PF (FLUARIX) 0.5 ML injection Inject into the muscle.   ipratropium (ATROVENT) 0.06 % nasal spray INSTILL 1 TO 2 SPRAYS IN EACH NOSTRIL 3-4 TIMES A DAY AS NEEDED   Lactobacillus (ACIDOPHILUS PO) Take 1 capsule by mouth daily.   levocetirizine (XYZAL) 5 MG tablet Take 1 tablet (5 mg total) by mouth daily in the evening   MELATONIN PO Take 1 tablet by mouth at bedtime.   meloxicam (MOBIC) 7.5 MG tablet TAKE 1 TABLET (7.5 MG TOTAL) BY MOUTH DAILY.   montelukast (SINGULAIR) 10 MG tablet Take 1 tablet by mouth once daily   Multiple Vitamin (MULTIVITAMIN) tablet Take 1 tablet by mouth daily.   nitrofurantoin, macrocrystal-monohydrate, (MACROBID) 100 MG capsule Take 1 capsule twice a day by oral route for 5 days.  Omega-3 Fatty Acids (FISH OIL PO) Take 1 capsule by mouth daily.   RESTASIS 0.05 % ophthalmic emulsion Place 1 drop into both eyes 2 (two) times daily.   rifaximin (XIFAXAN) 550 MG TABS tablet Take 1 tablet (550 mg total) by mouth 3 (three) times daily.   tretinoin (RETIN-A) 0.05 % cream APPLY TOPICALLY TO AFFECTED AREA ON THE SKIN NIGHTLY.   triamcinolone cream (KENALOG) 0.1 % Apply 1 application to affected area three times daily for 10 days; do not place on face or genitals   [DISCONTINUED] ALPRAZolam  (XANAX) 0.5 MG tablet Take 0.5 mg by mouth daily as needed for anxiety.   No facility-administered encounter medications on file as of 07/08/2021.    Allergies as of 07/08/2021   (No Known Allergies)    Past Medical History:  Diagnosis Date   Basal cell carcinoma    skin cancer   Colon polyps    Diverticulosis    CT   Fibroids 09/07/2013   PONV (postoperative nausea and vomiting)    non recent    S/P hysterectomy with oophorectomy 09/07/2013   Seasonal allergies    SVD (spontaneous vaginal delivery)    x 1    Past Surgical History:  Procedure Laterality Date   ABDOMINAL HYSTERECTOMY     COLONOSCOPY     2 or 3   COLONOSCOPY N/A 01/04/2017   Dr. Oneida Alar: Significant looping of the rectosigmoid colon, 4 mm hyperplastic polyp removed from rectum, internal hemorrhoids.  Next colonoscopy in 5 to 10 years with MAC and ultra slim colonoscope.   COLONOSCOPY WITH PROPOFOL N/A 12/05/2019   Procedure: COLONOSCOPY WITH PROPOFOL;  Surgeon: Danie Binder, MD;  [facility];  Service: Endoscopy;  Laterality: N/A;  7:30am   DILATION AND CURETTAGE OF UTERUS     x 3   EUS N/A 11/16/2013   Dr. Ardis Hughs: parenchyma in tail of pancreas suggests chronic pancreatitis, abrupt changes in main pancreatic duct in tail becomes dilated up to 4-51m, numerous associated dilated side branches, no solid mass. bx most c/w IPMN   EYE SURGERY     lasik bilateral   LAPAROSCOPY N/A 09/25/2013   Procedure: LAPAROSCOPY OPERATIVE;  Surgeon: VElveria Royals MD;  [facility];  Service: Gynecology;  Laterality: N/A;   PANCREATECTOMY N/A 12/07/2013   Procedure: LAPAROSCOPIC DISTAL  PANCREATECTOMY;  Surgeon: FStark Klein MD;  [facility]  Service: General;  Laterality: N/A;   POLYPECTOMY  01/04/2017   Procedure: POLYPECTOMY;  Surgeon: FDanie Binder MD;  [facility];  Service: Endoscopy;;  rectal   POLYPECTOMY  12/05/2019   Procedure: POLYPECTOMY;  Surgeon: FDanie Binder MD;  [facility];  Service: Endoscopy;;   ROBOTIC ASSISTED TOTAL HYSTERECTOMY WITH BILATERAL SALPINGO OOPHERECTOMY Bilateral 09/07/2013   Procedure: ROBOTIC ASSISTED TOTAL HYSTERECTOMY WITH BILATERAL SALPINGO OOPHORECTOMY;  Surgeon: VElveria Royals MD;  [facility];  Service: Gynecology;  Laterality: Bilateral;   WISDOM TOOTH EXTRACTION      Family History  Problem Relation Age of Onset   COPD Mother    Colon cancer Mother    Lung cancer Mother [phone]  Diabetes Brother    Diverticulitis Brother    Heart disease Maternal Grandfather    Stomach cancer Neg Hx    Esophageal cancer Neg Hx    Pancreatic cancer Neg Hx     Social History   Socioeconomic History   Marital status: Married    Spouse name: Not on  file   Number of children: 1   Years of education: Not on file   Highest education level: Not on file  Occupational History   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[employer], VP Advertising account executive  Tobacco Use   Smoking status: Former    Packs/day: 1.00    Years: 10.00    Pack years: 10.00    Types: Cigarettes    Quit date: 01/02/1987    Years since quitting: 34.5   Smokeless tobacco: Never  Vaping Use   Vaping Use: Never used  Substance and Sexual Activity   Alcohol use: Yes    Comment: 2-3 glases of wine on weekend nights   Drug use: No   Sexual activity: Not Currently    Birth control/protection: Post-menopausal, Surgical  Other Topics Concern   Not on file  Social History Narrative   Not on file   Social Determinants of Health   Financial Resource Strain: Not on file  Food Insecurity: Not on file  Transportation Needs: Not on file  Physical Activity: Not on file  Stress: Not on file  Social Connections: Not on file  Intimate Partner Violence: Not on file      Review of systems: All other review of systems negative except as mentioned in the HPI.   Physical Exam: Vitals:   07/08/21 1043  BP: 130/80  Pulse: 69   Body mass index  is 26.96 kg/m. Gen:      No acute distress HEENT:  sclera anicteric Abd:      soft, non-tender; no palpable masses, no distension Ext:    No edema Neuro: alert and oriented x 3 Psych: normal mood and affect  Data Reviewed:  Reviewed labs, radiology imaging, old records and pertinent past GI work up   Assessment and Plan/Recommendations:  65 year old very pleasant female with complaints of change in bowel habits, intermittent diarrhea likely irritable bowel syndrome predominant diarrhea She had an episode of acute exacerbation of diarrhea while she was traveling, has since resolved.  If recurs will obtain GI pathogen panel to exclude acute infectious etiology  Plan to do a course of Xifaxan 550 mg 3 times daily for 14 days for possible SIBO associated IBS diarrhea   Anal fissure: Use nitroglycerin 0.125% 3 times daily for 4 additional 4 to 6 weeks. Benefiber 1 tablespoon 2-3 times daily with meals. Avoid excessive straining during defecation   Pancreatic IPMN s/p distal pancreatectomy 2015.  She has been getting surveillance CT early, no new pancreatic lesions, overall low risk.  We will hold off on going surveillance CT due to increased radiation burden.  May consider MRI/MRCP in the future if has any change in symptoms   Personal history of colon polyps and family history of colon cancer: Due for recall colonoscopy May 2026   Return in 2 months or sooner if needed   This visit required 40 minutes of patient care (this includes precharting, chart review, review of results, face-to-face time used for counseling as well as treatment plan and follow-up. The patient was provided an opportunity to ask questions and all were answered. The patient agreed with the plan and demonstrated an understanding of the instructions.  Damaris Hippo , MD    CC: Prince Solian, MD

## 2021-07-08 NOTE — Telephone Encounter (Signed)
Prior authorization approved through Cover My Meds until 09/30/2021 #[id number]  Patient going to call pharmacy to see what her co-pay is

## 2021-07-08 NOTE — Patient Instructions (Addendum)
We have sent a prescription for nitroglycerin 0.125% gel to [facility]. You should apply a pea size amount to your rectum three times daily x 6-8 weeks.  [facility]'s information is below: Address: [address] [place], [place] [address]  [phone]  *Please DO NOT go directly from our office to pick up this medication! Give the pharmacy 1 day to process the prescription as this is compounded and takes time to make.   Your provider has requested that you go to the basement level for lab work before leaving today. Press "B" on the elevator. The lab is located at the first door on the left as you exit the elevator.    We have sent Xifaxian to your regular pharmacy for now  Continue Nitroglycerin  Take Benefiber 1 tablespoon twice a day   If you are age 70 or older, your body mass index should be between 23-30. Your Body mass index is 26.96 kg/m. If this is out of the aforementioned range listed, please consider follow up with your Primary Care Provider.  If you are age 46 or younger, your body mass index should be between 19-25. Your Body mass index is 26.96 kg/m. If this is out of the aformentioned range listed, please consider follow up with your Primary Care Provider.   ________________________________________________________  The [facility] providers would like to encourage you to use [facility] to communicate with providers for non-urgent requests or questions.  Due to long hold times on the telephone, sending your provider a message by [facility] may be a faster and more efficient way to get a response.  Please allow 48 business hours for a response.  Please remember that this is for non-urgent requests.  _______________________________________________________   Thank you for choosing [facility]  Kavitha Nandigam,MD

## 2021-07-09 ENCOUNTER — Other Ambulatory Visit (HOSPITAL_BASED_OUTPATIENT_CLINIC_OR_DEPARTMENT_OTHER): Payer: Self-pay

## 2021-07-14 ENCOUNTER — Other Ambulatory Visit (HOSPITAL_BASED_OUTPATIENT_CLINIC_OR_DEPARTMENT_OTHER): Payer: Self-pay

## 2021-07-14 MED FILL — Estradiol TD Patch Twice Weekly 0.025 MG/24HR: TRANSDERMAL | 84 days supply | Qty: 24 | Fill #1 | Status: AC

## 2021-07-14 MED FILL — Cyclosporine (Ophth) Emulsion 0.05%: OPHTHALMIC | 90 days supply | Qty: 180 | Fill #1 | Status: AC

## 2021-07-15 ENCOUNTER — Other Ambulatory Visit: Payer: Self-pay | Admitting: Internal Medicine

## 2021-07-15 DIAGNOSIS — E785 Hyperlipidemia, unspecified: Secondary | ICD-10-CM

## 2021-07-23 NOTE — Progress Notes (Deleted)
[place] Phone: [phone] Subjective:    I'm seeing this patient by the request  of:  [name], [name], MD  CC: right hip and glute pain   VEL:[id number]  06/17/2021 Patient given PRP with post PRP injection protocol given.  Discussed with patient about increasing activity slowly.  Patient will be traveling for the holidays and hopefully will have some time to heal.  Follow-up with me again in 6 weeks  Update 07/29/2021 [name] is a 65 y.o. female coming in with complaint of R hip and glute pain.  Patient is 6 weeks out from PRP of the gluteal tendon.  Patient on MRI did have partial tearing of the right gluteal minimus tendon.  Patient states     Past Medical History:  Diagnosis Date   Basal cell carcinoma    skin cancer   Colon polyps    Diverticulosis    CT   Fibroids 09/07/2013   IBS (irritable bowel syndrome)    PONV (postoperative nausea and vomiting)    non recent    S/P hysterectomy with oophorectomy 09/07/2013   Seasonal allergies    SVD (spontaneous vaginal delivery)    x 1   Past Surgical History:  Procedure Laterality Date   ABDOMINAL HYSTERECTOMY     COLONOSCOPY     2 or 3   COLONOSCOPY N/A 01/04/2017   Dr. Oneida Alar: Significant looping of the rectosigmoid colon, 4 mm hyperplastic polyp removed from rectum, internal hemorrhoids.  Next colonoscopy in 5 to 10 years with MAC and ultra slim colonoscope.   COLONOSCOPY WITH PROPOFOL N/A 12/05/2019   Procedure: COLONOSCOPY WITH PROPOFOL;  Surgeon: Danie Binder, MD;  [facility];  Service: Endoscopy;  Laterality: N/A;  7:30am   DILATION AND CURETTAGE OF UTERUS     x 3   EUS N/A 11/16/2013   Dr. Ardis Hughs: parenchyma in tail of pancreas suggests chronic pancreatitis, abrupt changes in main pancreatic duct in tail becomes dilated up to 4-7m, numerous associated dilated side branches, no solid mass. bx most c/w IPMN   EYE SURGERY      lasik bilateral   LAPAROSCOPY N/A 09/25/2013   Procedure: LAPAROSCOPY OPERATIVE;  Surgeon: VElveria Royals MD;  [facility];  Service: Gynecology;  Laterality: N/A;   PANCREATECTOMY N/A 12/07/2013   Procedure: LAPAROSCOPIC DISTAL  PANCREATECTOMY;  Surgeon: FStark Klein MD;  [facility]  Service: General;  Laterality: N/A;   POLYPECTOMY  01/04/2017   Procedure: POLYPECTOMY;  Surgeon: FDanie Binder MD;  [facility];  Service: Endoscopy;;  rectal   POLYPECTOMY  12/05/2019   Procedure: POLYPECTOMY;  Surgeon: FDanie Binder MD;  [facility];  Service: Endoscopy;;   ROBOTIC ASSISTED TOTAL HYSTERECTOMY WITH BILATERAL SALPINGO OOPHERECTOMY Bilateral 09/07/2013   Procedure: ROBOTIC ASSISTED TOTAL HYSTERECTOMY WITH BILATERAL SALPINGO OOPHORECTOMY;  Surgeon: VElveria Royals MD;  [facility];  Service: Gynecology;  Laterality: Bilateral;   WISDOM TOOTH EXTRACTION     Social History   Socioeconomic History   Marital status: Married    Spouse name: Not on file   Number of children: 1   Years of education: Not on file   Highest education level: Not on file  Occupational History   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 Tobacco Use   Smoking status: Former    Packs/day:  1.00    Years: 10.00    Pack years: 10.00    Types: Cigarettes    Quit date: 01/02/1987    Years since quitting: 34.5   Smokeless tobacco: Never  Vaping Use   Vaping Use: Never used  Substance and Sexual Activity   Alcohol use: Yes    Comment: 2-3 glases of wine on weekend nights   Drug use: No   Sexual activity: Not Currently    Birth control/protection: Post-menopausal, Surgical  Other Topics Concern   Not on file  Social History Narrative   Not on file   Social Determinants of Health   Financial Resource Strain: Not on file  Food Insecurity: Not on file  Transportation Needs: Not on file  Physical Activity: Not on  file  Stress: Not on file  Social Connections: Not on file   No Known Allergies Family History  Problem Relation Age of Onset   COPD Mother    Colon cancer Mother    Lung cancer Mother 73   Diabetes Brother    Diverticulitis Brother    Heart disease Maternal Grandfather    Stomach cancer Neg Hx    Esophageal cancer Neg Hx    Pancreatic cancer Neg Hx     Current Outpatient Medications (Endocrine & Metabolic):    estradiol (VIVELLE-DOT) 0.025 MG/24HR, APPLY 1 PATCH TO SKIN TWICE A WEEK   Current Outpatient Medications (Respiratory):    fexofenadine (ALLEGRA) 180 MG tablet, Take 180 mg by mouth daily as needed for allergies or rhinitis.   fexofenadine-pseudoephedrine (ALLEGRA-D) 60-120 MG per tablet, Take 1 tablet by mouth daily.   ipratropium (ATROVENT) 0.06 % nasal spray, INSTILL 1 TO 2 SPRAYS IN EACH NOSTRIL 3-4 TIMES A DAY AS NEEDED   levocetirizine (XYZAL) 5 MG tablet, Take 1 tablet (5 mg total) by mouth daily in the evening   montelukast (SINGULAIR) 10 MG tablet, Take 1 tablet by mouth once daily  Current Outpatient Medications (Analgesics):    meloxicam (MOBIC) 7.5 MG tablet, TAKE 1 TABLET (7.5 MG TOTAL) BY MOUTH DAILY. (Patient taking differently: Take by mouth daily. As needed)   Current Outpatient Medications (Other):    AMBULATORY NON FORMULARY MEDICATION, Medication Name: Nitroglycerin ointment 0.125% use pea sized amount three times a day for 6-8 weeks   ascorbic acid (VITAMIN C) 100 MG tablet, Take 100 mg by mouth daily.    Azelaic Acid 15 % cream, APPLY 1 GRAM TOPICALLY ON THE SKIN ONCE DAILY.   b complex vitamins tablet, Take 1 tablet by mouth daily.   Calcium Carb-Cholecalciferol 600-800 MG-UNIT TABS, Take 1 tablet by mouth daily.   COVID-19 At Home Antigen Test [facility] COVID-19 HOME TEST) KIT, Use as directed within package instructions   COVID-19 mRNA bivalent vaccine, Pfizer, (PFIZER COVID-19 VAC BIVALENT) injection, Inject into the muscle.   cycloSPORINE  (RESTASIS) 0.05 % ophthalmic emulsion, PLACE 1 DROP INTO BOTH EYES 2 TIMES DAILY.   Diclofenac Sodium 2 % SOLN, Place 2 g onto the skin 2 (two) times daily. (Patient taking differently: Place 2 g onto the skin 2 (two) times daily as needed (pain).)   dimenhyDRINATE (DRAMAMINE) 50 MG tablet, Take 50 mg by mouth in the morning and at bedtime.   influenza vac split quadrivalent PF (FLUARIX) 0.5 ML injection, Inject into the muscle.   Lactobacillus (ACIDOPHILUS PO), Take 1 capsule by mouth daily.   MELATONIN PO, Take 1 tablet by mouth at bedtime.   Multiple Vitamin (MULTIVITAMIN) tablet, Take 1 tablet by mouth  daily.   Omega-3 Fatty Acids (FISH OIL PO), Take 1 capsule by mouth daily.   RESTASIS 0.05 % ophthalmic emulsion, Place 1 drop into both eyes 2 (two) times daily.   rifaximin (XIFAXAN) 550 MG TABS tablet, Take 1 tablet (550 mg total) by mouth 3 (three) times daily.   tretinoin (RETIN-A) 0.05 % cream, APPLY TOPICALLY TO AFFECTED AREA ON THE SKIN NIGHTLY.   triamcinolone cream (KENALOG) 0.1 %, Apply 1 application to affected area three times daily for 10 days; do not place on face or genitals   Reviewed prior external information including notes and imaging from  primary care provider As well as notes that were available from care everywhere and other healthcare systems.  Past medical history, social, surgical and family history all reviewed in electronic medical record.  No pertanent information unless stated regarding to the chief complaint.   Review of Systems:  No headache, visual changes, nausea, vomiting, diarrhea, constipation, dizziness, abdominal pain, skin rash, fevers, chills, night sweats, weight loss, swollen lymph nodes, body aches, joint swelling, chest pain, shortness of breath, mood changes. POSITIVE muscle aches  Objective  There were no vitals taken for this visit.   General: No apparent distress alert and oriented x3 mood and affect normal, dressed appropriately.   HEENT: Pupils equal, extraocular movements intact  Respiratory: Patient's speak in full sentences and does not appear short of breath  Cardiovascular: No lower extremity edema, non tender, no erythema  Gait normal with good balance and coordination.  MSK:    Osteopathic findings Cervical C2 flexed rotated and side bent right C4 flexed rotated and side bent left C6 flexed rotated and side bent left T3 extended rotated and side bent right inhaled third rib T9 extended rotated and side bent left L2 flexed rotated and side bent right Sacrum right on right     Impression and Recommendations:     The above documentation has been reviewed and is accurate and complete [name], DO

## 2021-07-29 ENCOUNTER — Other Ambulatory Visit (HOSPITAL_BASED_OUTPATIENT_CLINIC_OR_DEPARTMENT_OTHER): Payer: Self-pay

## 2021-07-29 ENCOUNTER — Ambulatory Visit: Payer: 59 | Admitting: Family Medicine

## 2021-07-29 DIAGNOSIS — D490 Neoplasm of unspecified behavior of digestive system: Secondary | ICD-10-CM | POA: Diagnosis not present

## 2021-07-29 DIAGNOSIS — J029 Acute pharyngitis, unspecified: Secondary | ICD-10-CM | POA: Diagnosis not present

## 2021-07-29 DIAGNOSIS — R059 Cough, unspecified: Secondary | ICD-10-CM | POA: Diagnosis not present

## 2021-07-29 DIAGNOSIS — E785 Hyperlipidemia, unspecified: Secondary | ICD-10-CM | POA: Diagnosis not present

## 2021-07-29 DIAGNOSIS — U071 COVID-19: Secondary | ICD-10-CM | POA: Diagnosis not present

## 2021-07-29 DIAGNOSIS — Z1152 Encounter for screening for COVID-19: Secondary | ICD-10-CM | POA: Diagnosis not present

## 2021-07-29 DIAGNOSIS — R519 Headache, unspecified: Secondary | ICD-10-CM | POA: Diagnosis not present

## 2021-07-29 DIAGNOSIS — R0981 Nasal congestion: Secondary | ICD-10-CM | POA: Diagnosis not present

## 2021-07-29 DIAGNOSIS — R509 Fever, unspecified: Secondary | ICD-10-CM | POA: Diagnosis not present

## 2021-07-29 MED ORDER — LAGEVRIO 200 MG PO CAPS
ORAL_CAPSULE | ORAL | 0 refills | Status: DC
  Filled 2021-07-29: qty 40, 5d supply, fill #0

## 2021-07-30 ENCOUNTER — Encounter: Payer: Self-pay | Admitting: Family Medicine

## 2021-07-30 ENCOUNTER — Other Ambulatory Visit: Payer: Self-pay

## 2021-07-30 MED ORDER — DICLOFENAC SODIUM 2 % EX SOLN: 2.0000 g | Freq: Two times a day (BID) | CUTANEOUS | 3 refills | Status: DC

## 2021-07-31 ENCOUNTER — Other Ambulatory Visit (HOSPITAL_BASED_OUTPATIENT_CLINIC_OR_DEPARTMENT_OTHER): Payer: Self-pay

## 2021-08-21 DIAGNOSIS — L570 Actinic keratosis: Secondary | ICD-10-CM | POA: Diagnosis not present

## 2021-08-21 DIAGNOSIS — L820 Inflamed seborrheic keratosis: Secondary | ICD-10-CM | POA: Diagnosis not present

## 2021-08-21 DIAGNOSIS — D485 Neoplasm of uncertain behavior of skin: Secondary | ICD-10-CM | POA: Diagnosis not present

## 2021-08-22 ENCOUNTER — Other Ambulatory Visit (HOSPITAL_BASED_OUTPATIENT_CLINIC_OR_DEPARTMENT_OTHER): Payer: Self-pay

## 2021-08-22 MED FILL — Ipratropium Bromide Nasal Soln 0.06% (42 MCG/SPRAY): NASAL | 20 days supply | Qty: 15 | Fill #2 | Status: AC

## 2021-09-01 ENCOUNTER — Ambulatory Visit: Payer: 59 | Admitting: Gastroenterology

## 2021-09-01 ENCOUNTER — Encounter: Payer: Self-pay | Admitting: Gastroenterology

## 2021-09-01 VITALS — BP 106/72 | HR 60 | Ht 65.0 in | Wt 159.5 lb

## 2021-09-01 DIAGNOSIS — K602 Anal fissure, unspecified: Secondary | ICD-10-CM | POA: Diagnosis not present

## 2021-09-01 DIAGNOSIS — D490 Neoplasm of unspecified behavior of digestive system: Secondary | ICD-10-CM

## 2021-09-01 DIAGNOSIS — A09 Infectious gastroenteritis and colitis, unspecified: Secondary | ICD-10-CM | POA: Diagnosis not present

## 2021-09-01 DIAGNOSIS — K580 Irritable bowel syndrome with diarrhea: Secondary | ICD-10-CM | POA: Diagnosis not present

## 2021-09-01 DIAGNOSIS — Z800 Family history of malignant neoplasm of digestive organs: Secondary | ICD-10-CM | POA: Diagnosis not present

## 2021-09-01 MED ORDER — AMBULATORY NON FORMULARY MEDICATION: 0 refills | Status: DC

## 2021-09-01 NOTE — Patient Instructions (Addendum)
We have sent a prescription for nitroglycerin 0.125% gel to [facility]. You should apply a pea size amount to your rectum three times daily x 6-8 weeks.  [facility]'s information is below: Address: [address] [place], [place] [address]  [phone]  *Please DO NOT go directly from our office to pick up this medication! Give the pharmacy 1 day to process the prescription as this is compounded and takes time to make.   Your provider has requested that you go to the basement level for lab work before leaving today. Press "B" on the elevator. The lab is located at the first door on the left as you exit the elevator.   Due to recent changes in healthcare laws, you may see the results of your imaging and laboratory studies on MyChart before your provider has had a chance to review them.  We understand that in some cases there may be results that are confusing or concerning to you. Not all laboratory results come back in the same time frame and the provider may be waiting for multiple results in order to interpret others.  Please give [place] 48 hours in order for your provider to thoroughly review all the results before contacting the office for clarification of your results.    I appreciate the  opportunity to care for you  Thank You   Harl Bowie , MD

## 2021-09-01 NOTE — Progress Notes (Signed)
[name]    [id number]    07/15/1956  Primary Care Physician:[name], Steva Ready, MD  Referring Physician: Prince Solian, MD [address] [place],  [place] [address]   Chief complaint:  Anal fissure  HPI:  66 year old very pleasant female here for follow-up visit for anal fissure  Diarrhea is somewhat better after Xifaxan, in the past few days she started having increased bowel frequency and diarrhea.    She had tried Creon in the past, did not notice any significant improvement with that  Denies any rectal bleeding or melena.  No abdominal pain.  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 Oneida Alar   3 mm tubular adenoma removed from cecum, diverticulosis and internal hemorrhoids   Family history of colon cancer in mother   CT abdomen pelvis with contrast January 2021: Negative for any acute pathology, no new pancreatic cysts or lesions.   Outpatient Encounter Medications as of 09/01/2021  Medication Sig   AMBULATORY NON FORMULARY MEDICATION Medication Name: Nitroglycerin ointment 0.125% use pea sized amount three times a day for 6-8 weeks   ascorbic acid (VITAMIN C) 100 MG tablet Take 100 mg by mouth daily.    Azelaic Acid 15 % cream APPLY 1 GRAM TOPICALLY ON THE SKIN ONCE DAILY.   b complex vitamins tablet Take 1 tablet by mouth daily.   Calcium Carb-Cholecalciferol 600-800 MG-UNIT TABS Take 1 tablet by mouth daily.   cycloSPORINE (RESTASIS) 0.05 % ophthalmic emulsion PLACE 1 DROP INTO BOTH EYES 2 TIMES DAILY.   Diclofenac Sodium 2 % SOLN Place 2 g onto the skin 2 (two) times daily.   dimenhyDRINATE (DRAMAMINE) 50 MG tablet Take 50 mg by mouth in the morning and at bedtime.   estradiol (VIVELLE-DOT) 0.025 MG/24HR APPLY 1 PATCH TO SKIN TWICE A WEEK   fexofenadine (ALLEGRA) 180 MG tablet Take 180 mg by mouth daily as needed for allergies or rhinitis.   fexofenadine-pseudoephedrine (ALLEGRA-D) 60-120 MG  per tablet Take 1 tablet by mouth daily.   influenza vac split quadrivalent PF (FLUARIX) 0.5 ML injection Inject into the muscle.   ipratropium (ATROVENT) 0.06 % nasal spray INSTILL 1 TO 2 SPRAYS IN EACH NOSTRIL 3-4 TIMES A DAY AS NEEDED   Lactobacillus (ACIDOPHILUS PO) Take 1 capsule by mouth daily.   levocetirizine (XYZAL) 5 MG tablet Take 1 tablet (5 mg total) by mouth daily in the evening   MELATONIN PO Take 1 tablet by mouth at bedtime.   meloxicam (MOBIC) 7.5 MG tablet TAKE 1 TABLET (7.5 MG TOTAL) BY MOUTH DAILY. (Patient taking differently: Take by mouth daily. As needed)   molnupiravir EUA (LAGEVRIO) 200 MG CAPS capsule 4 capsules Orally every 12 hrs 5 day(s)   montelukast (SINGULAIR) 10 MG tablet Take 1 tablet by mouth once daily   Multiple Vitamin (MULTIVITAMIN) tablet Take 1 tablet by mouth daily.   Omega-3 Fatty Acids (FISH OIL PO) Take 1 capsule by mouth daily.   RESTASIS 0.05 % ophthalmic emulsion Place 1 drop into both eyes 2 (two) times daily.   tretinoin (RETIN-A) 0.05 % cream APPLY TOPICALLY TO AFFECTED AREA ON THE SKIN NIGHTLY.   triamcinolone cream (KENALOG) 0.1 % Apply 1 application to affected area three times daily for 10 days; do not place on face or genitals   [DISCONTINUED] COVID-19 At Home Antigen Test (CARESTART COVID-19 HOME TEST) KIT Use as directed within package instructions (  Patient not taking: Reported on 09/01/2021)   [DISCONTINUED] COVID-19 mRNA bivalent vaccine, Pfizer, (PFIZER COVID-19 VAC BIVALENT) injection Inject into the muscle. (Patient not taking: Reported on 09/01/2021)   [DISCONTINUED] rifaximin (XIFAXAN) 550 MG TABS tablet Take 1 tablet (550 mg total) by mouth 3 (three) times daily. (Patient not taking: Reported on 09/01/2021)   No facility-administered encounter medications on file as of 09/01/2021.    Allergies as of 09/01/2021   (No Known Allergies)    Past Medical History:  Diagnosis Date   Basal cell carcinoma    skin cancer   Colon polyps     Diverticulosis    CT   Fibroids 09/07/2013   IBS (irritable bowel syndrome)    PONV (postoperative nausea and vomiting)    non recent    S/P hysterectomy with oophorectomy 09/07/2013   Seasonal allergies    SVD (spontaneous vaginal delivery)    x 1    Past Surgical History:  Procedure Laterality Date   ABDOMINAL HYSTERECTOMY     COLONOSCOPY     2 or 3   COLONOSCOPY N/A 01/04/2017   Dr. Oneida Alar: Significant looping of the rectosigmoid colon, 4 mm hyperplastic polyp removed from rectum, internal hemorrhoids.  Next colonoscopy in 5 to 10 years with MAC and ultra slim colonoscope.   COLONOSCOPY WITH PROPOFOL N/A 12/05/2019   Procedure: COLONOSCOPY WITH PROPOFOL;  Surgeon: Danie Binder, MD;  [facility];  Service: Endoscopy;  Laterality: N/A;  7:30am   DILATION AND CURETTAGE OF UTERUS     x 3   EUS N/A 11/16/2013   Dr. Ardis Hughs: parenchyma in tail of pancreas suggests chronic pancreatitis, abrupt changes in main pancreatic duct in tail becomes dilated up to 4-61m, numerous associated dilated side branches, no solid mass. bx most c/w IPMN   EYE SURGERY     lasik bilateral   LAPAROSCOPY N/A 09/25/2013   Procedure: LAPAROSCOPY OPERATIVE;  Surgeon: VElveria Royals MD;  [facility];  Service: Gynecology;  Laterality: N/A;   PANCREATECTOMY N/A 12/07/2013   Procedure: LAPAROSCOPIC DISTAL  PANCREATECTOMY;  Surgeon: FStark Klein MD;  [facility]  Service: General;  Laterality: N/A;   POLYPECTOMY  01/04/2017   Procedure: POLYPECTOMY;  Surgeon: FDanie Binder MD;  [facility];  Service: Endoscopy;;  rectal   POLYPECTOMY  12/05/2019   Procedure: POLYPECTOMY;  Surgeon: FDanie Binder MD;  [facility];  Service: Endoscopy;;   ROBOTIC ASSISTED TOTAL HYSTERECTOMY WITH BILATERAL SALPINGO OOPHERECTOMY Bilateral 09/07/2013   Procedure: ROBOTIC ASSISTED TOTAL HYSTERECTOMY WITH BILATERAL SALPINGO OOPHORECTOMY;  Surgeon: VElveria Royals MD;  [facility];   Service: Gynecology;  Laterality: Bilateral;   WISDOM TOOTH EXTRACTION      Family History  Problem Relation Age of Onset   COPD Mother    Colon cancer Mother    Lung cancer Mother 714  Diabetes Brother    Diverticulitis Brother    Heart disease Maternal Grandfather    Stomach cancer Neg Hx    Esophageal cancer Neg Hx    Pancreatic cancer Neg Hx     Social History   Socioeconomic History   Marital status: Married    Spouse name: Not on file   Number of children: 1   Years of education: Not on file   Highest education level: Not on file  Occupational History   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[employer]  Tobacco Use   Smoking status: Former    Packs/day: 1.00    Years: 10.00    Pack years: 10.00    Types: Cigarettes    Quit date: 01/02/1987    Years since quitting: 34.6   Smokeless tobacco: Never  Vaping Use   Vaping Use: Never used  Substance and Sexual Activity   Alcohol use: Yes    Comment: 2-3 glases of wine on weekend nights   Drug use: No   Sexual activity: Not Currently    Birth control/protection: Post-menopausal, Surgical  Other Topics Concern   Not on file  Social History Narrative   Not on file   Social Determinants of Health   Financial Resource Strain: Not on file  Food Insecurity: Not on file  Transportation Needs: Not on file  Physical Activity: Not on file  Stress: Not on file  Social Connections: Not on file  Intimate Partner Violence: Not on file      Review of systems: All other review of systems negative except as mentioned in the HPI.   Physical Exam: Vitals:   09/01/21 0824  BP: 106/72  Pulse: 60  SpO2: 98%   Body mass index is 26.54 kg/m. Gen:      No acute distress HEENT:  sclera anicteric Abd:      soft, non-tender; no palpable masses, no distension Ext:    No edema Neuro: alert and oriented x 3 Psych: normal mood and affect  Data Reviewed:  Reviewed labs,  radiology imaging, old records and pertinent past GI work up   Assessment and Plan/Recommendations:  66 year old very pleasant female with complaints of change in bowel habits, intermittent diarrhea likely irritable bowel syndrome predominant diarrhea Diarrhea had improved with Xifaxan but is recurring Check GI pathogen panel to exclude any infectious etiology  Check fecal elastase to exclude pancreatic insufficiency  Anal fissure: Use nitroglycerin 0.125% 3 times daily for additional 4 to 6 weeks. Benefiber 1 tablespoon 2-3 times daily with meals. Avoid excessive straining during defecation   Pancreatic IPMN s/p distal pancreatectomy 2015.  She has been getting surveillance CT early, no new pancreatic lesions, overall low risk.  We will hold off on going surveillance CT due to increased radiation burden.  May consider MRI/MRCP in the future if has any change in symptoms   Personal history of colon polyps and family history of colon cancer: Due for recall colonoscopy May 2026   Return in 3 months  This visit required 40 minutes of patient care (this includes precharting, chart review, review of results, face-to-face time used for counseling as well as treatment plan and follow-up. The patient was provided an opportunity to ask questions and all were answered. The patient agreed with the plan and demonstrated an understanding of the instructions.  Damaris Hippo , MD    CC: Prince Solian, MD

## 2021-09-02 ENCOUNTER — Other Ambulatory Visit (HOSPITAL_BASED_OUTPATIENT_CLINIC_OR_DEPARTMENT_OTHER): Payer: Self-pay

## 2021-09-02 MED ORDER — MONTELUKAST SODIUM 10 MG PO TABS
10.0000 mg | ORAL_TABLET | Freq: Every day | ORAL | 2 refills | Status: DC
  Filled 2021-09-02: qty 90, 90d supply, fill #0
  Filled 2021-12-08: qty 90, 90d supply, fill #1
  Filled 2022-03-12: qty 90, 90d supply, fill #2

## 2021-09-02 NOTE — Progress Notes (Signed)
[name] Phone: [phone] Subjective:   [name], am serving as a scribe for Dr. Hulan Saas. This visit occurred during the SARS-CoV-2 public health emergency.  Safety protocols were in place, including screening questions prior to the visit, additional usage of staff PPE, and extensive cleaning of exam room while observing appropriate contact time as indicated for disinfecting solutions.  I'm seeing this patient by the request  of:  [name], [name], MD  CC: right hip pain   [id number]  06/17/2021 Patient given PRP with post PRP injection protocol given.  Discussed with patient about increasing activity slowly.  Patient will be traveling for the holidays and hopefully will have some time to heal.  Follow-up with me again in 6 weeks  Updated 09/03/2021 [name] is a 66 y.o. female coming in with complaint of R hip pain. Patient states that she did not have any relief from PRP injection. Patient had dramatic increase in back pain after getting COVID. Back is maybe somewhat better but both hips are now bothering her over Gts. Also having R knee pain over medial aspect. Patient has had pain on and off for a while.  Right hip pain       Past Medical History:  Diagnosis Date   Basal cell carcinoma    skin cancer   Colon polyps    Diverticulosis    CT   Fibroids 09/07/2013   IBS (irritable bowel syndrome)    PONV (postoperative nausea and vomiting)    non recent    S/P hysterectomy with oophorectomy 09/07/2013   Seasonal allergies    SVD (spontaneous vaginal delivery)    x 1   Past Surgical History:  Procedure Laterality Date   ABDOMINAL HYSTERECTOMY     COLONOSCOPY     2 or 3   COLONOSCOPY N/A 01/04/2017   Dr. Oneida Alar: Significant looping of the rectosigmoid colon, 4 mm hyperplastic polyp removed from rectum, internal hemorrhoids.  Next colonoscopy in 5 to 10 years with MAC and ultra slim  colonoscope.   COLONOSCOPY WITH PROPOFOL N/A 12/05/2019   Procedure: COLONOSCOPY WITH PROPOFOL;  Surgeon: Danie Binder, MD;  [facility];  Service: Endoscopy;  Laterality: N/A;  7:30am   DILATION AND CURETTAGE OF UTERUS     x 3   EUS N/A 11/16/2013   Dr. Ardis Hughs: parenchyma in tail of pancreas suggests chronic pancreatitis, abrupt changes in main pancreatic duct in tail becomes dilated up to 4-60m, numerous associated dilated side branches, [name] solid mass. bx most c/w IPMN   EYE SURGERY     lasik bilateral   LAPAROSCOPY N/A 09/25/2013   Procedure: LAPAROSCOPY OPERATIVE;  Surgeon: VElveria Royals MD;  [facility];  Service: Gynecology;  Laterality: N/A;   PANCREATECTOMY N/A 12/07/2013   Procedure: LAPAROSCOPIC DISTAL  PANCREATECTOMY;  Surgeon: FStark Klein MD;  [facility]  Service: General;  Laterality: N/A;   POLYPECTOMY  01/04/2017   Procedure: POLYPECTOMY;  Surgeon: FDanie Binder MD;  [facility];  Service: Endoscopy;;  rectal   POLYPECTOMY  12/05/2019   Procedure: POLYPECTOMY;  Surgeon: FDanie Binder MD;  [facility];  Service: Endoscopy;;   ROBOTIC ASSISTED TOTAL HYSTERECTOMY WITH BILATERAL SALPINGO OOPHERECTOMY Bilateral 09/07/2013   Procedure: ROBOTIC ASSISTED TOTAL HYSTERECTOMY WITH BILATERAL SALPINGO OOPHORECTOMY;  Surgeon: VElveria Royals MD;  [facility];  Service: Gynecology;  Laterality: Bilateral;   WISDOM  TOOTH EXTRACTION     Social History   Socioeconomic History   Marital status: Married    Spouse name: Not on file   Number of children: 1   Years of education: Not on file   Highest education level: Not on file  Occupational History   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  Tobacco Use   Smoking status: Former    Packs/day: 1.00    Years: 10.00    Pack years: 10.00    Types: Cigarettes    Quit date: 01/02/1987    Years since quitting: 34.6   Smokeless  tobacco: Never  Vaping Use   Vaping Use: Never used  Substance and Sexual Activity   Alcohol use: Yes    Comment: 2-3 glases of wine on weekend nights   Drug use: [name]   Sexual activity: Not Currently    Birth control/protection: Post-menopausal, Surgical  Other Topics Concern   Not on file  Social History Narrative   Not on file   Social Determinants of Health   Financial Resource Strain: Not on file  Food Insecurity: Not on file  Transportation Needs: Not on file  Physical Activity: Not on file  Stress: Not on file  Social Connections: Not on file   [name] Known Allergies Family History  Problem Relation Age of Onset   COPD Mother    Colon cancer Mother    Lung cancer Mother 10   Diabetes Brother    Diverticulitis Brother    Heart disease Maternal Grandfather    Stomach cancer Neg Hx    Esophageal cancer Neg Hx    Pancreatic cancer Neg Hx     Current Outpatient Medications (Endocrine & Metabolic):    estradiol (VIVELLE-DOT) 0.025 MG/24HR, APPLY 1 PATCH TO SKIN TWICE A WEEK   Current Outpatient Medications (Respiratory):    fexofenadine (ALLEGRA) 180 MG tablet, Take 180 mg by mouth daily as needed for allergies or rhinitis.   fexofenadine-pseudoephedrine (ALLEGRA-D) 60-120 MG per tablet, Take 1 tablet by mouth daily.   ipratropium (ATROVENT) 0.06 % nasal spray, INSTILL 1 TO 2 SPRAYS IN EACH NOSTRIL 3-4 TIMES A DAY AS NEEDED   levocetirizine (XYZAL) 5 MG tablet, Take 1 tablet (5 mg total) by mouth daily in the evening   montelukast (SINGULAIR) 10 MG tablet, Take 1 tablet by mouth once daily  Current Outpatient Medications (Analgesics):    meloxicam (MOBIC) 7.5 MG tablet, TAKE 1 TABLET (7.5 MG TOTAL) BY MOUTH DAILY. (Patient taking differently: Take by mouth daily. As needed)   Current Outpatient Medications (Other):    AMBULATORY NON FORMULARY MEDICATION, Medication Name: Nitroglycerin ointment 0.125% use pea sized amount three times a day for 6-8 weeks   ascorbic acid  (VITAMIN C) 100 MG tablet, Take 100 mg by mouth daily.    Azelaic Acid 15 % cream, APPLY 1 GRAM TOPICALLY ON THE SKIN ONCE DAILY.   b complex vitamins tablet, Take 1 tablet by mouth daily.   Calcium Carb-Cholecalciferol 600-800 MG-UNIT TABS, Take 1 tablet by mouth daily.   cycloSPORINE (RESTASIS) 0.05 % ophthalmic emulsion, PLACE 1 DROP INTO BOTH EYES 2 TIMES DAILY.   Diclofenac Sodium 2 % SOLN, Place 2 g onto the skin 2 (two) times daily.   dimenhyDRINATE (DRAMAMINE) 50 MG tablet, Take 50 mg by mouth in the morning and at bedtime.   influenza vac split quadrivalent PF (FLUARIX) 0.5 ML injection, Inject into the muscle.  Lactobacillus (ACIDOPHILUS PO), Take 1 capsule by mouth daily.   MELATONIN PO, Take 1 tablet by mouth at bedtime.   molnupiravir EUA (LAGEVRIO) 200 MG CAPS capsule, 4 capsules Orally every 12 hrs 5 day(s)   Multiple Vitamin (MULTIVITAMIN) tablet, Take 1 tablet by mouth daily.   Omega-3 Fatty Acids (FISH OIL PO), Take 1 capsule by mouth daily.   RESTASIS 0.05 % ophthalmic emulsion, Place 1 drop into both eyes 2 (two) times daily.   tretinoin (RETIN-A) 0.05 % cream, APPLY TOPICALLY TO AFFECTED AREA ON THE SKIN NIGHTLY.   triamcinolone cream (KENALOG) 0.1 %, Apply 1 application to affected area three times daily for 10 days; do not place on face or genitals   Reviewed prior external information including notes and imaging from  primary care provider As well as notes that were available from care everywhere and other healthcare systems.  Past medical history, social, surgical and family history all reviewed in electronic medical record.  [name] pertanent information unless stated regarding to the chief complaint.   Review of Systems:  [name] headache, visual changes, nausea, vomiting, diarrhea, constipation, dizziness, abdominal pain, skin rash, fevers, chills, night sweats, weight loss, swollen lymph nodes, body aches, joint swelling, chest pain, shortness of breath, mood changes.  POSITIVE muscle aches  Objective  Blood pressure 122/82, pulse 70, height _0  (1.651 m), SpO2 97 %.   General: [name] apparent distress alert and oriented x3 mood and affect normal, dressed appropriately.  HEENT: Pupils equal, extraocular movements intact  Respiratory: Patient's speak in full sentences and does not appear short of breath  Cardiovascular: [name] lower extremity edema, non tender, [name] erythema  MSK: Right hip exam still shows some tenderness to palpation over the greater trochanteric area.  Patient does have some loss of lordosis noted as well of the lumbar spine.  Patient does have tenderness in the parascapular region as well right greater than left.   Limited muscular skeletal ultrasound was performed and interpreted by Hulan Saas, M  Limited results shows the patient right greater trochanteric area does have some mild hypoechoic changes noted.  Gluteal tendon though does have what appears to be scar tissue formation in it which does show interval improvement from previous exam. Impression: Interval improvement in gluteal tendinitis but mild reactive bursitis.Osteopathic findings  Omt findings   C4 flexed rotated and side bent left T3 extended rotated and side bent right inhaled third rib T8 extended rotated and side bent left L2 flexed rotated and side bent right Sacrum right on right    Impression and Recommendations:     The above documentation has been reviewed and is accurate and complete [name], DO

## 2021-09-03 ENCOUNTER — Other Ambulatory Visit: Payer: Self-pay

## 2021-09-03 ENCOUNTER — Ambulatory Visit: Payer: 59 | Admitting: Family Medicine

## 2021-09-03 ENCOUNTER — Encounter: Payer: Self-pay | Admitting: Family Medicine

## 2021-09-03 ENCOUNTER — Ambulatory Visit: Payer: Self-pay

## 2021-09-03 VITALS — BP 122/82 | HR 70 | Ht 65.0 in

## 2021-09-03 DIAGNOSIS — M9904 Segmental and somatic dysfunction of sacral region: Secondary | ICD-10-CM

## 2021-09-03 DIAGNOSIS — M9903 Segmental and somatic dysfunction of lumbar region: Secondary | ICD-10-CM | POA: Diagnosis not present

## 2021-09-03 DIAGNOSIS — M9908 Segmental and somatic dysfunction of rib cage: Secondary | ICD-10-CM | POA: Diagnosis not present

## 2021-09-03 DIAGNOSIS — M25551 Pain in right hip: Secondary | ICD-10-CM | POA: Diagnosis not present

## 2021-09-03 DIAGNOSIS — M7601 Gluteal tendinitis, right hip: Secondary | ICD-10-CM

## 2021-09-03 DIAGNOSIS — M5416 Radiculopathy, lumbar region: Secondary | ICD-10-CM

## 2021-09-03 DIAGNOSIS — M999 Biomechanical lesion, unspecified: Secondary | ICD-10-CM

## 2021-09-03 DIAGNOSIS — G8929 Other chronic pain: Secondary | ICD-10-CM

## 2021-09-03 DIAGNOSIS — M9901 Segmental and somatic dysfunction of cervical region: Secondary | ICD-10-CM

## 2021-09-03 DIAGNOSIS — M9902 Segmental and somatic dysfunction of thoracic region: Secondary | ICD-10-CM

## 2021-09-03 DIAGNOSIS — M545 Low back pain, unspecified: Secondary | ICD-10-CM

## 2021-09-03 NOTE — Assessment & Plan Note (Signed)
Patient did have the PRP.  Do show some scar tissue formation but still having pain on the lateral aspect of the hip.  May respond well to an injection as well.  Follow-up again in 6 to 8 weeks.

## 2021-09-03 NOTE — Assessment & Plan Note (Signed)
Do believe that some of the hip pain could be secondary to more of a lumbar radiculopathy.  Would like to order an epidural at the L3-L4 level that could be contributing.  This is seen on patient's previous imaging.  Depending on how patient responds to this and then we will decide if we need to see if it injection for the reactive bursitis.  Wanted to hold at this point with patient seeming to make some improvement with the gluteal tear.  Follow-up again in 4 to 6 weeks

## 2021-09-03 NOTE — Assessment & Plan Note (Signed)

## 2021-09-03 NOTE — Patient Instructions (Signed)
Make yourself Youtube sensation L3/L4 epidural [phone] See me in 4-6 weeks

## 2021-09-05 ENCOUNTER — Other Ambulatory Visit: Payer: 59

## 2021-09-05 DIAGNOSIS — A09 Infectious gastroenteritis and colitis, unspecified: Secondary | ICD-10-CM

## 2021-09-05 DIAGNOSIS — K580 Irritable bowel syndrome with diarrhea: Secondary | ICD-10-CM

## 2021-09-05 DIAGNOSIS — K602 Anal fissure, unspecified: Secondary | ICD-10-CM | POA: Diagnosis not present

## 2021-09-08 ENCOUNTER — Telehealth: Payer: Self-pay | Admitting: Gastroenterology

## 2021-09-08 LAB — GI PROFILE, STOOL, PCR

## 2021-09-08 NOTE — Telephone Encounter (Signed)
Please inform patient results and send Rx for Ciprofloxacin 500mg  BID X 5 days with no refills. Advise patient to call with any change or worsening symptoms. Thanks

## 2021-09-08 NOTE — Telephone Encounter (Signed)
Received Critical Alert from [facility] on patient, as follows:  E-Coli - detected

## 2021-09-09 NOTE — Telephone Encounter (Signed)
Spoke with pt and let her know the results and recommendations. Pt states she is getting ready to go out of town on Thursday for 9 days and she wants to know if it is ok for her to take the antibiotic and travel. She is concerned as she feels Cipro is a strong antibiotic, she wants to know what Dr. Silverio Decamp recommends. Please advise.

## 2021-09-09 NOTE — Telephone Encounter (Signed)
Patient returning Dr. Woodward Ku call, please advise.

## 2021-09-09 NOTE — Telephone Encounter (Signed)
Called the patient back. Voicemail. Left her a message offering a different antibiotic as per Dr Silverio Decamp. Asked she call us back or send a message through My Chart if she does want a different antibiotic.

## 2021-09-09 NOTE — Telephone Encounter (Signed)
Called patient to discuss antibiotics and management of E. coli infection/diarrhea.  Unable to reach, left a voice message. If she is hesitant to use ciprofloxacin, we can switch the prescription to azithromycin.

## 2021-09-09 NOTE — Telephone Encounter (Signed)
Nandigam pt, see note below

## 2021-09-10 ENCOUNTER — Other Ambulatory Visit (HOSPITAL_BASED_OUTPATIENT_CLINIC_OR_DEPARTMENT_OTHER): Payer: Self-pay

## 2021-09-10 ENCOUNTER — Other Ambulatory Visit: Payer: Self-pay

## 2021-09-10 MED ORDER — CIPROFLOXACIN HCL 500 MG PO TABS
500.0000 mg | ORAL_TABLET | Freq: Two times a day (BID) | ORAL | 0 refills | Status: AC
  Filled 2021-09-10: qty 10, 5d supply, fill #0

## 2021-09-10 NOTE — Telephone Encounter (Signed)
Spoke with the patient. She is traveling out of the country and was concerned about starting an antibiotic at this time. Encouraged to start the medication.  Patient agrees to begin today.

## 2021-09-10 NOTE — Telephone Encounter (Signed)
Patient returned call. Would like to start Cipro prescription

## 2021-09-12 ENCOUNTER — Encounter: Payer: Self-pay | Admitting: Gastroenterology

## 2021-09-22 ENCOUNTER — Other Ambulatory Visit (HOSPITAL_BASED_OUTPATIENT_CLINIC_OR_DEPARTMENT_OTHER): Payer: Self-pay

## 2021-09-23 ENCOUNTER — Ambulatory Visit
Admission: RE | Admit: 2021-09-23 | Discharge: 2021-09-23 | Disposition: A | Discharge status: Home / Self Care | Payer: No Typology Code available for payment source | Source: Ambulatory Visit | Attending: Internal Medicine | Admitting: Internal Medicine

## 2021-09-23 ENCOUNTER — Other Ambulatory Visit: Payer: Self-pay

## 2021-09-23 DIAGNOSIS — E785 Hyperlipidemia, unspecified: Secondary | ICD-10-CM

## 2021-09-24 ENCOUNTER — Ambulatory Visit
Admission: RE | Admit: 2021-09-24 | Discharge: 2021-09-24 | Disposition: A | Discharge status: Home / Self Care | Payer: 59 | Source: Ambulatory Visit | Attending: Family Medicine | Admitting: Family Medicine

## 2021-09-24 DIAGNOSIS — M5416 Radiculopathy, lumbar region: Secondary | ICD-10-CM

## 2021-09-24 DIAGNOSIS — M5116 Intervertebral disc disorders with radiculopathy, lumbar region: Secondary | ICD-10-CM | POA: Diagnosis not present

## 2021-09-24 MED ORDER — METHYLPREDNISOLONE ACETATE 40 MG/ML INJ SUSP (RADIOLOG
80.0000 mg | Freq: Once | INTRAMUSCULAR | Status: AC
  Administered 2021-09-24: 80 mg via EPIDURAL

## 2021-09-24 MED ORDER — IOPAMIDOL (ISOVUE-M 200) INJECTION 41%
1.0000 mL | Freq: Once | INTRAMUSCULAR | Status: AC
  Administered 2021-09-24: 1 mL via EPIDURAL

## 2021-09-24 NOTE — Discharge Instructions (Signed)

## 2021-10-01 NOTE — Progress Notes (Signed)
[name] [facility] [address] [place] [name] Phone: [phone] Subjective:   [name], am serving as a scribe for Dr. Hulan Saas. This visit occurred during the SARS-CoV-2 public health emergency.  Safety protocols were in place, including screening questions prior to the visit, additional usage of staff PPE, and extensive cleaning of exam room while observing appropriate contact time as indicated for disinfecting solutions.   I'm seeing this patient by the request  of:  [name], [name], MD  CC: low back pain   [id number]  09/03/2021 Do believe that some of the hip pain could be secondary to more of a lumbar radiculopathy.  Would like to order an epidural at the L3-L4 level that could be contributing.  This is seen on patient's previous imaging.  Depending on how patient responds to this and then we will decide if we need to see if it injection for the reactive bursitis.  Wanted to hold at this point with patient seeming to make some improvement with the gluteal tear.  Follow-up again in 4 to 6 weeks  Patient did have the PRP.  Do show some scar tissue formation but still having pain on the lateral aspect of the hip.  May respond well to an injection as well.  Follow-up again in 6 to 8 weeks.  Performed OMT  Updated 10/02/2021 [name] is a 66 y.o. female coming in with complaint of glute pain. Epidural helped hip pain. Giving it more time. Heel on the right side for about 3 weeks. Feels the pain mostly when extending knee. Has video of her on bike for your analysis. Heel pain with walkingonly       Past Medical History:  Diagnosis Date   Basal cell carcinoma    skin cancer   Colon polyps    Diverticulosis    CT   Fibroids 09/07/2013   IBS (irritable bowel syndrome)    PONV (postoperative nausea and vomiting)    non recent    S/P hysterectomy with oophorectomy 09/07/2013   Seasonal allergies    SVD (spontaneous vaginal  delivery)    x 1   Past Surgical History:  Procedure Laterality Date   ABDOMINAL HYSTERECTOMY     COLONOSCOPY     2 or 3   COLONOSCOPY N/A 01/04/2017   Dr. Oneida Alar: Significant looping of the rectosigmoid colon, 4 mm hyperplastic polyp removed from rectum, internal hemorrhoids.  Next colonoscopy in 5 to 10 years with MAC and ultra slim colonoscope.   COLONOSCOPY WITH PROPOFOL N/A 12/05/2019   Procedure: COLONOSCOPY WITH PROPOFOL;  Surgeon: Danie Binder, MD;  [facility];  Service: Endoscopy;  Laterality: N/A;  7:30am   DILATION AND CURETTAGE OF UTERUS     x 3   EUS N/A 11/16/2013   Dr. Ardis Hughs: parenchyma in tail of pancreas suggests chronic pancreatitis, abrupt changes in main pancreatic duct in tail becomes dilated up to 4-43m, numerous associated dilated side branches, no solid mass. bx most c/w IPMN   EYE SURGERY     lasik bilateral   LAPAROSCOPY N/A 09/25/2013   Procedure: LAPAROSCOPY OPERATIVE;  Surgeon: VElveria Royals MD;  [facility];  Service: Gynecology;  Laterality: N/A;   PANCREATECTOMY N/A 12/07/2013   Procedure: LAPAROSCOPIC DISTAL  PANCREATECTOMY;  Surgeon: FStark Klein MD;  [facility]  Service: General;  Laterality: N/A;   POLYPECTOMY  01/04/2017   Procedure: POLYPECTOMY;  Surgeon: FDanie Binder MD;  [facility];  Service: Endoscopy;;  rectal   POLYPECTOMY  12/05/2019   Procedure: POLYPECTOMY;  Surgeon: Danie Binder, MD;  [facility];  Service: Endoscopy;;   ROBOTIC ASSISTED TOTAL HYSTERECTOMY WITH BILATERAL SALPINGO OOPHERECTOMY Bilateral 09/07/2013   Procedure: ROBOTIC ASSISTED TOTAL HYSTERECTOMY WITH BILATERAL SALPINGO OOPHORECTOMY;  Surgeon: Elveria Royals, MD;  Location: [facility];  Service: Gynecology;  Laterality: Bilateral;   WISDOM TOOTH EXTRACTION     Social History   Socioeconomic History   Marital status: Married    Spouse name: Not on file   Number of children: 1   Years of education: Not on file   Highest  education level: Not on file  Occupational History   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[employer]: [name], VP Advertising account executive  Tobacco Use   Smoking status: Former    Packs/day: 1.00    Years: 10.00    Pack years: 10.00    Types: Cigarettes    Quit date: 01/02/1987    Years since quitting: 34.7   Smokeless tobacco: Never  Vaping Use   Vaping Use: Never used  Substance and Sexual Activity   Alcohol use: Yes    Comment: 2-3 glases of wine on weekend nights   Drug use: No   Sexual activity: Not Currently    Birth control/protection: Post-menopausal, Surgical  Other Topics Concern   Not on file  Social History Narrative   Not on file   Social Determinants of Health   Financial Resource Strain: Not on file  Food Insecurity: Not on file  Transportation Needs: Not on file  Physical Activity: Not on file  Stress: Not on file  Social Connections: Not on file   No Known Allergies Family History  Problem Relation Age of Onset   COPD Mother    Colon cancer Mother    Lung cancer Mother 38   Diabetes Brother    Diverticulitis Brother    Heart disease Maternal Grandfather    Stomach cancer Neg Hx    Esophageal cancer Neg Hx    Pancreatic cancer Neg Hx     Current Outpatient Medications (Endocrine & Metabolic):    estradiol (VIVELLE-DOT) 0.025 MG/24HR, APPLY 1 PATCH TO SKIN TWICE A WEEK   Current Outpatient Medications (Respiratory):    fexofenadine (ALLEGRA) 180 MG tablet, Take 180 mg by mouth daily as needed for allergies or rhinitis.   fexofenadine-pseudoephedrine (ALLEGRA-D) 60-120 MG per tablet, Take 1 tablet by mouth daily.   ipratropium (ATROVENT) 0.06 % nasal spray, INSTILL 1 TO 2 SPRAYS IN EACH NOSTRIL 3-4 TIMES A DAY AS NEEDED   levocetirizine (XYZAL) 5 MG tablet, Take 1 tablet (5 mg total) by mouth daily in the evening   montelukast (SINGULAIR) 10 MG tablet, Take 1 tablet by mouth once daily  Current Outpatient Medications (Analgesics):     meloxicam (MOBIC) 7.5 MG tablet, TAKE 1 TABLET (7.5 MG TOTAL) BY MOUTH DAILY. (Patient taking differently: Take by mouth daily. As needed)   Current Outpatient Medications (Other):    AMBULATORY NON FORMULARY MEDICATION, Medication Name: Nitroglycerin ointment 0.125% use pea sized amount three times a day for 6-8 weeks   ascorbic acid (VITAMIN C) 100 MG tablet, Take 100 mg by mouth daily.    b complex vitamins tablet, Take 1 tablet by mouth daily.   Calcium Carb-Cholecalciferol 600-800 MG-UNIT TABS, Take 1 tablet by mouth daily.   cycloSPORINE (RESTASIS) 0.05 % ophthalmic emulsion, PLACE 1 DROP  INTO BOTH EYES 2 TIMES DAILY.   Diclofenac Sodium 2 % SOLN, Place 2 g onto the skin 2 (two) times daily.   dimenhyDRINATE (DRAMAMINE) 50 MG tablet, Take 50 mg by mouth in the morning and at bedtime.   influenza vac split quadrivalent PF (FLUARIX) 0.5 ML injection, Inject into the muscle.   Lactobacillus (ACIDOPHILUS PO), Take 1 capsule by mouth daily.   MELATONIN PO, Take 1 tablet by mouth at bedtime.   molnupiravir EUA (LAGEVRIO) 200 MG CAPS capsule, 4 capsules Orally every 12 hrs 5 day(s)   Multiple Vitamin (MULTIVITAMIN) tablet, Take 1 tablet by mouth daily.   Omega-3 Fatty Acids (FISH OIL PO), Take 1 capsule by mouth daily.   RESTASIS 0.05 % ophthalmic emulsion, Place 1 drop into both eyes 2 (two) times daily.   triamcinolone cream (KENALOG) 0.1 %, Apply 1 application to affected area three times daily for 10 days; do not place on face or genitals   Reviewed prior external information including notes and imaging from  primary care provider As well as notes that were available from care everywhere and other healthcare systems.  Past medical history, social, surgical and family history all reviewed in electronic medical record.  No pertanent information unless stated regarding to the chief complaint.   Review of Systems:  No headache, visual changes, nausea, vomiting, diarrhea, constipation,  dizziness, abdominal pain, skin rash, fevers, chills, night sweats, weight loss, swollen lymph nodes, body aches, joint swelling, chest pain, shortness of breath, mood changes. POSITIVE muscle aches  Objective  Blood pressure 130/86, temperature (!) 79 F (26.1 C), height _0  (1.651 m), SpO2 96 %.   General: No apparent distress alert and oriented x3 mood and affect normal, dressed appropriately.  HEENT: Pupils equal, extraocular movements intact  Respiratory: Patient's speak in full sentences and does not appear short of breath  Cardiovascular: No lower extremity edema, non tender, no erythema  Gait normal with good balance and coordination.  MSK:  low back does have some mild loss of lordosis tightness in the SI joint bilaterally. Lack of 5 degrees of extension  5/5 strength  Neck has loss of lordosis as well   Heel ttp on plantar aspect of heel on right side, no swelling   Limited muscular skeletal ultrasound was performed and interpreted by Hulan Saas, M  Limited ultrasound shows mild hypoechoic changes in the fat pad.   Osteopathic findings C4 flexed rotated and side bent left C5 flexed rotated and side bent left T3 extended rotated and side bent right inhaled third rib T6 extended rotated and side bent left L1 flexed rotated and side bent right Sacrum right on right    Impression and Recommendations:     The above documentation has been reviewed and is accurate and complete [name], DO

## 2021-10-02 ENCOUNTER — Ambulatory Visit: Payer: 59 | Admitting: Family Medicine

## 2021-10-02 ENCOUNTER — Other Ambulatory Visit: Payer: Self-pay

## 2021-10-02 ENCOUNTER — Ambulatory Visit: Payer: Self-pay

## 2021-10-02 VITALS — BP 130/86 | HR 79 | Ht 65.0 in

## 2021-10-02 DIAGNOSIS — S9031XA Contusion of right foot, initial encounter: Secondary | ICD-10-CM | POA: Diagnosis not present

## 2021-10-02 DIAGNOSIS — M9904 Segmental and somatic dysfunction of sacral region: Secondary | ICD-10-CM | POA: Diagnosis not present

## 2021-10-02 DIAGNOSIS — M9902 Segmental and somatic dysfunction of thoracic region: Secondary | ICD-10-CM

## 2021-10-02 DIAGNOSIS — G8929 Other chronic pain: Secondary | ICD-10-CM

## 2021-10-02 DIAGNOSIS — M545 Low back pain, unspecified: Secondary | ICD-10-CM

## 2021-10-02 DIAGNOSIS — M9901 Segmental and somatic dysfunction of cervical region: Secondary | ICD-10-CM

## 2021-10-02 DIAGNOSIS — M9903 Segmental and somatic dysfunction of lumbar region: Secondary | ICD-10-CM

## 2021-10-02 DIAGNOSIS — M9908 Segmental and somatic dysfunction of rib cage: Secondary | ICD-10-CM

## 2021-10-02 DIAGNOSIS — M999 Biomechanical lesion, unspecified: Secondary | ICD-10-CM

## 2021-10-02 DIAGNOSIS — M79671 Pain in right foot: Secondary | ICD-10-CM | POA: Diagnosis not present

## 2021-10-02 NOTE — Patient Instructions (Signed)
Good to see you! ?Fat pad irritation ?Get a heel cup ?No more walking barefoot ?See you again in 6-8 weeks ?

## 2021-10-03 DIAGNOSIS — S9031XA Contusion of right foot, initial encounter: Secondary | ICD-10-CM | POA: Insufficient documentation

## 2021-10-03 NOTE — Assessment & Plan Note (Signed)
Decision today to treat with OMT was based on Physical Exam ? ?After verbal consent patient was treated with HVLA, ME techniques in cervical, thoracic, rib, lumbar and sacral  areas ? ?Patient tolerated the procedure well with improvement in symptoms ? ?Patient given exercises, stretches and lifestyle modifications ? ?See medications in patient instructions if given ? ?Patient will follow up in 6-8 weeks ? ?

## 2021-10-03 NOTE — Assessment & Plan Note (Signed)
Chronic but stable. Discussed HEP, discussed stretching, taking meloxicam as needed.  ?RTC in 6-8 weeks  ?

## 2021-10-03 NOTE — Assessment & Plan Note (Signed)
Contusion of the heel. Discussed heel pad, discussed avoiding being barefoot.  ?rtc in 6-8 weeks  ?

## 2021-10-20 ENCOUNTER — Encounter (HOSPITAL_BASED_OUTPATIENT_CLINIC_OR_DEPARTMENT_OTHER): Payer: Self-pay | Admitting: Pharmacist

## 2021-10-20 ENCOUNTER — Other Ambulatory Visit (HOSPITAL_BASED_OUTPATIENT_CLINIC_OR_DEPARTMENT_OTHER): Payer: Self-pay

## 2021-10-20 MED ORDER — ESTRADIOL 0.025 MG/24HR TD PTTW
MEDICATED_PATCH | TRANSDERMAL | 0 refills | Status: DC
  Filled 2021-10-20: qty 8, 28d supply, fill #0

## 2021-10-23 DIAGNOSIS — H524 Presbyopia: Secondary | ICD-10-CM | POA: Diagnosis not present

## 2021-10-23 DIAGNOSIS — Z961 Presence of intraocular lens: Secondary | ICD-10-CM | POA: Diagnosis not present

## 2021-10-23 DIAGNOSIS — H04123 Dry eye syndrome of bilateral lacrimal glands: Secondary | ICD-10-CM | POA: Diagnosis not present

## 2021-11-04 ENCOUNTER — Other Ambulatory Visit (HOSPITAL_BASED_OUTPATIENT_CLINIC_OR_DEPARTMENT_OTHER): Payer: Self-pay

## 2021-11-04 MED ORDER — IPRATROPIUM BROMIDE 0.06 % NA SOLN
NASAL | 3 refills | Status: AC
  Filled 2021-11-04: qty 15, 30d supply, fill #0
  Filled 2022-04-21: qty 15, 30d supply, fill #1
  Filled 2022-05-21: qty 15, 30d supply, fill #2

## 2021-11-11 ENCOUNTER — Other Ambulatory Visit (HOSPITAL_BASED_OUTPATIENT_CLINIC_OR_DEPARTMENT_OTHER): Payer: Self-pay

## 2021-11-11 DIAGNOSIS — K5790 Diverticulosis of intestine, part unspecified, without perforation or abscess without bleeding: Secondary | ICD-10-CM | POA: Diagnosis not present

## 2021-11-11 DIAGNOSIS — Z1152 Encounter for screening for COVID-19: Secondary | ICD-10-CM | POA: Diagnosis not present

## 2021-11-11 DIAGNOSIS — R1084 Generalized abdominal pain: Secondary | ICD-10-CM | POA: Diagnosis not present

## 2021-11-11 DIAGNOSIS — K5732 Diverticulitis of large intestine without perforation or abscess without bleeding: Secondary | ICD-10-CM | POA: Diagnosis not present

## 2021-11-11 MED ORDER — AMOXICILLIN-POT CLAVULANATE 875-125 MG PO TABS
ORAL_TABLET | ORAL | 0 refills | Status: DC
  Filled 2021-11-11: qty 20, 10d supply, fill #0

## 2021-11-19 NOTE — Progress Notes (Signed)
?[name] D.O. ?[name] ?[name] ?Phone: [phone] ?Subjective:   ?I, [name], am serving as a scribe for Dr. Hulan Saas. ? ?I'm seeing this patient by the request  of:  [name], [name], MD ? ?CC: neck and back pain  ? ?[id number]  ?[name] is a 66 y.o. female coming in with complaint of back and neck pain. OMT on 10/02/2021. Also seen for heel pain. Patient states that the heel is close to being 100% better, back is doing okay , but wants to talk about her right knee pain. Patient states that she is a side sleeper and the knee would start hurting with them stacked. No pain when she take the 5 days spurts of Meloxicam. Patient locates the pain to medial right knee but has since noticed pain in the back of the knee.   ? ?Medications patient has been prescribed: None ? ?Taking: Meloxicam  ? ? ?  ? ? ? ? ?Reviewed prior external information including notes and imaging from previsou exam, outside providers and external EMR if available.  ? ?As well as notes that were available from care everywhere and other healthcare systems. ? ?Past medical history, social, surgical and family history all reviewed in electronic medical record.  No pertanent information unless stated regarding to the chief complaint.  ? ?Past Medical History:  ?Diagnosis Date  ? Basal cell carcinoma   ? skin cancer  ? Colon polyps   ? Diverticulosis   ? CT  ? Fibroids 09/07/2013  ? IBS (irritable bowel syndrome)   ? PONV (postoperative nausea and vomiting)   ? non recent   ? S/P hysterectomy with oophorectomy 09/07/2013  ? Seasonal allergies   ? SVD (spontaneous vaginal delivery)   ? x 1  ?  ?No Known Allergies ? ? ?Review of Systems: ? No headache, visual changes, nausea, vomiting, diarrhea, constipation, dizziness, abdominal pain, skin rash, fevers, chills, night sweats, weight loss, swollen lymph nodes, body aches, joint swelling, chest pain, shortness of breath, mood changes.  POSITIVE muscle aches ? ?Objective  ?Blood pressure 128/72, pulse (!) 51, height '5\' 5"'$  (1.651 m), SpO2 98 %. ?  ?General: No apparent distress alert and oriented x3 mood and affect normal, dressed appropriately.  ?HEENT: Pupils equal, extraocular movements intact  ?Respiratory: Patient's speak in full sentences and does not appear short of breath  ?Cardiovascular: No lower extremity edema, non tender, no erythema  ?Loss of lordosis noted on the right side. Ttp in the right SI joint  ?Right knee exam shows the patient is tender to palpation over the Pez anserine area.  Minimal over the knee joint itself.  Very mild crepitus in the lateral tracking noted. ? ?Procedure: Real-time Ultrasound Guided Injection of right pes anserine ?Device: GE Logiq Q7 ?Ultrasound guided injection is preferred based studies that show increased duration, increased effect, greater accuracy, decreased procedural pain, increased response rate, and decreased cost with ultrasound guided versus blind injection.  ?Verbal informed consent obtained.  ?Time-out conducted.  ?Noted no overlying erythema, induration, or other signs of local infection.  ?Skin prepped in a sterile fashion.  ?Local anesthesia: Topical Ethyl chloride.  ?With sterile technique and under real time ultrasound guidance: With a 25-gauge half inch needle injected with 0.5 cc of 0.5% Marcaine and 0.5 cc of Kenalog 40 mg/mL ?Completed without difficulty  ?Pain immediately resolved suggesting accurate placement of the medication.  ?Advised to call if fevers/chills, erythema, induration, drainage, or persistent  bleeding.  ?Impression: Technically successful ultrasound guided injection. ? ?Osteopathic findings ? ?C2 flexed rotated and side bent right ?C5 flexed rotated and side bent left ?T3 extended rotated and side bent right inhaled rib ?T6 extended rotated and side bent left ?L3 flexed rotated and side bent right ?Sacrum right on right ? ? ? ?  ?Assessment and Plan: ? ?Pes  anserine bursitis ?Patient given injection and tolerated the procedure well, discussed icing regimen and home exercises.  Hopefully patient does get some improvement.  Patient has had difficulty with the knee and could consider the possibility of advanced imaging but it does seem to be very intermittent and not stopping her from significant amount of activity.  Discussed icing regimen.  Increase activity as tolerated and follow-up again in 6 weeks ? ?Low back pain ?Chronic problem with mild exacerbation.  Has been trying to do the exercises on a more regular basis.  Patient does have the meloxicam and is using it more on an as-needed basis.  We discussed using it in 3 to 5-day burst.  Increase activity otherwise.  Follow-up with me again in 6 to 8 weeks. ?  ? ?Nonallopathic problems ? ?Decision today to treat with OMT was based on Physical Exam ? ?After verbal consent patient was treated with HVLA, ME, FPR techniques in cervical, rib, thoracic, lumbar, and sacral  areas ? ?Patient tolerated the procedure well with improvement in symptoms ? ?Patient given exercises, stretches and lifestyle modifications ? ?See medications in patient instructions if given ? ?Patient will follow up in 4-8 weeks ? ?  ? ? ?The above documentation has been reviewed and is accurate and complete [name], DO ? ? ? ?  ? ? Note: This dictation was prepared with Dragon dictation along with smaller phrase technology. Any transcriptional errors that result from this process are unintentional.    ?  ?  ? ?

## 2021-11-20 ENCOUNTER — Other Ambulatory Visit: Payer: Self-pay | Admitting: Family Medicine

## 2021-11-20 ENCOUNTER — Ambulatory Visit: Payer: Self-pay

## 2021-11-20 ENCOUNTER — Ambulatory Visit (INDEPENDENT_AMBULATORY_CARE_PROVIDER_SITE_OTHER): Payer: 59 | Admitting: Family Medicine

## 2021-11-20 ENCOUNTER — Ambulatory Visit (INDEPENDENT_AMBULATORY_CARE_PROVIDER_SITE_OTHER): Payer: 59

## 2021-11-20 VITALS — BP 128/72 | HR 51 | Ht 65.0 in

## 2021-11-20 DIAGNOSIS — M9903 Segmental and somatic dysfunction of lumbar region: Secondary | ICD-10-CM

## 2021-11-20 DIAGNOSIS — M9902 Segmental and somatic dysfunction of thoracic region: Secondary | ICD-10-CM | POA: Diagnosis not present

## 2021-11-20 DIAGNOSIS — M9901 Segmental and somatic dysfunction of cervical region: Secondary | ICD-10-CM

## 2021-11-20 DIAGNOSIS — M545 Low back pain, unspecified: Secondary | ICD-10-CM | POA: Diagnosis not present

## 2021-11-20 DIAGNOSIS — M9908 Segmental and somatic dysfunction of rib cage: Secondary | ICD-10-CM

## 2021-11-20 DIAGNOSIS — M9904 Segmental and somatic dysfunction of sacral region: Secondary | ICD-10-CM | POA: Diagnosis not present

## 2021-11-20 DIAGNOSIS — M25561 Pain in right knee: Secondary | ICD-10-CM

## 2021-11-20 DIAGNOSIS — G8929 Other chronic pain: Secondary | ICD-10-CM | POA: Diagnosis not present

## 2021-11-20 DIAGNOSIS — M7050 Other bursitis of knee, unspecified knee: Secondary | ICD-10-CM

## 2021-11-20 NOTE — Patient Instructions (Addendum)
Good to see you ?Injection given today ?Get X-rays on your way out ?See me again in 6-8 weeks  ? ?

## 2021-11-21 NOTE — Assessment & Plan Note (Signed)
Patient given injection and tolerated the procedure well, discussed icing regimen and home exercises.  Hopefully patient does get some improvement.  Patient has had difficulty with the knee and could consider the possibility of advanced imaging but it does seem to be very intermittent and not stopping her from significant amount of activity.  Discussed icing regimen.  Increase activity as tolerated and follow-up again in 6 weeks ?

## 2021-11-21 NOTE — Assessment & Plan Note (Signed)
Chronic problem with mild exacerbation.  Has been trying to do the exercises on a more regular basis.  Patient does have the meloxicam and is using it more on an as-needed basis.  We discussed using it in 3 to 5-day burst.  Increase activity otherwise.  Follow-up with me again in 6 to 8 weeks. ?

## 2021-11-24 ENCOUNTER — Other Ambulatory Visit (HOSPITAL_BASED_OUTPATIENT_CLINIC_OR_DEPARTMENT_OTHER): Payer: Self-pay

## 2021-11-24 MED ORDER — ESTRADIOL 0.025 MG/24HR TD PTTW
MEDICATED_PATCH | TRANSDERMAL | 0 refills | Status: DC
  Filled 2021-11-24: qty 8, 28d supply, fill #0

## 2021-12-01 DIAGNOSIS — L821 Other seborrheic keratosis: Secondary | ICD-10-CM | POA: Diagnosis not present

## 2021-12-01 DIAGNOSIS — L820 Inflamed seborrheic keratosis: Secondary | ICD-10-CM | POA: Diagnosis not present

## 2021-12-01 DIAGNOSIS — L814 Other melanin hyperpigmentation: Secondary | ICD-10-CM | POA: Diagnosis not present

## 2021-12-01 DIAGNOSIS — L570 Actinic keratosis: Secondary | ICD-10-CM | POA: Diagnosis not present

## 2021-12-01 DIAGNOSIS — D1801 Hemangioma of skin and subcutaneous tissue: Secondary | ICD-10-CM | POA: Diagnosis not present

## 2021-12-01 DIAGNOSIS — D225 Melanocytic nevi of trunk: Secondary | ICD-10-CM | POA: Diagnosis not present

## 2021-12-01 DIAGNOSIS — D2272 Melanocytic nevi of left lower limb, including hip: Secondary | ICD-10-CM | POA: Diagnosis not present

## 2021-12-01 DIAGNOSIS — D2262 Melanocytic nevi of left upper limb, including shoulder: Secondary | ICD-10-CM | POA: Diagnosis not present

## 2021-12-08 ENCOUNTER — Other Ambulatory Visit (HOSPITAL_BASED_OUTPATIENT_CLINIC_OR_DEPARTMENT_OTHER): Payer: Self-pay

## 2021-12-16 ENCOUNTER — Telehealth: Payer: Self-pay | Admitting: Gastroenterology

## 2021-12-24 ENCOUNTER — Other Ambulatory Visit (HOSPITAL_BASED_OUTPATIENT_CLINIC_OR_DEPARTMENT_OTHER): Payer: Self-pay

## 2021-12-25 ENCOUNTER — Other Ambulatory Visit (HOSPITAL_BASED_OUTPATIENT_CLINIC_OR_DEPARTMENT_OTHER): Payer: Self-pay

## 2021-12-25 MED ORDER — ESTRADIOL 0.025 MG/24HR TD PTTW
MEDICATED_PATCH | TRANSDERMAL | 0 refills | Status: DC
  Filled 2021-12-25: qty 8, 28d supply, fill #0

## 2021-12-26 ENCOUNTER — Telehealth: Payer: Self-pay | Admitting: Gastroenterology

## 2021-12-26 NOTE — Telephone Encounter (Signed)
Please advise patient to add Benefiber 1 tablespoon 3 times daily with meals.  Will refer to pelvic floor physical therapy to improve anal sphincter tone.  Agree with office follow-up.  Thank you

## 2021-12-26 NOTE — Telephone Encounter (Signed)
Inbound call from patient stating she is still having reoccurring bowel issues and is wanting to discuss with the nurse. Please advise.

## 2021-12-26 NOTE — Telephone Encounter (Signed)
Patient report 2 spells of urgent fecal incontinence this month. Describes soft brown stool. No abdominal pain. She is aware of this happening but is unable to control it.  She was on Augmentin for diverticulitis about 60 days ago. She has not had any diarrhea or excessive stools. I scheduled her a follow up appointment in July.  Do you have any recommendations?

## 2021-12-31 ENCOUNTER — Other Ambulatory Visit: Payer: Self-pay

## 2021-12-31 DIAGNOSIS — R159 Full incontinence of feces: Secondary | ICD-10-CM

## 2021-12-31 DIAGNOSIS — R152 Fecal urgency: Secondary | ICD-10-CM

## 2021-12-31 NOTE — Telephone Encounter (Signed)
Patient advised of the plan and agrees to this.

## 2022-01-01 ENCOUNTER — Ambulatory Visit: Payer: 59 | Admitting: Family Medicine

## 2022-01-09 ENCOUNTER — Other Ambulatory Visit (HOSPITAL_BASED_OUTPATIENT_CLINIC_OR_DEPARTMENT_OTHER): Payer: Self-pay

## 2022-01-09 DIAGNOSIS — Z6826 Body mass index (BMI) 26.0-26.9, adult: Secondary | ICD-10-CM | POA: Diagnosis not present

## 2022-01-09 DIAGNOSIS — Z01419 Encounter for gynecological examination (general) (routine) without abnormal findings: Secondary | ICD-10-CM | POA: Diagnosis not present

## 2022-01-09 MED ORDER — ESTRADIOL 0.025 MG/24HR TD PTTW
MEDICATED_PATCH | TRANSDERMAL | 4 refills | Status: DC
  Filled 2022-01-09: qty 24, 84d supply, fill #0
  Filled 2022-01-22: qty 16, 56d supply, fill #0
  Filled 2022-01-22: qty 8, 28d supply, fill #0
  Filled 2022-04-08: qty 24, 84d supply, fill #1
  Filled 2022-07-02: qty 24, 84d supply, fill #2
  Filled 2022-10-23: qty 24, 84d supply, fill #3

## 2022-01-12 ENCOUNTER — Other Ambulatory Visit: Payer: Self-pay | Admitting: Obstetrics & Gynecology

## 2022-01-12 DIAGNOSIS — E2839 Other primary ovarian failure: Secondary | ICD-10-CM

## 2022-01-16 NOTE — Progress Notes (Deleted)
  [place] Phone: [phone] Subjective:    I'm seeing this patient by the request  of:  [name], [name], MD  CC:   [id number]  [name] is a 66 y.o. female coming in with complaint of back and neck pain. OMT on 11/20/2021. Also seen for knee pain. Patient states   Medications patient has been prescribed: None  Taking:         Reviewed prior external information including notes and imaging from previsou exam, outside providers and external EMR if available.   As well as notes that were available from care everywhere and other healthcare systems.  Past medical history, social, surgical and family history all reviewed in electronic medical record.  No pertanent information unless stated regarding to the chief complaint.   Past Medical History:  Diagnosis Date   Basal cell carcinoma    skin cancer   Colon polyps    Diverticulosis    CT   Fibroids 09/07/2013   IBS (irritable bowel syndrome)    PONV (postoperative nausea and vomiting)    non recent    S/P hysterectomy with oophorectomy 09/07/2013   Seasonal allergies    SVD (spontaneous vaginal delivery)    x 1    No Known Allergies   Review of Systems:  No headache, visual changes, nausea, vomiting, diarrhea, constipation, dizziness, abdominal pain, skin rash, fevers, chills, night sweats, weight loss, swollen lymph nodes, body aches, joint swelling, chest pain, shortness of breath, mood changes. POSITIVE muscle aches  Objective  There were no vitals taken for this visit.   General: No apparent distress alert and oriented x3 mood and affect normal, dressed appropriately.  HEENT: Pupils equal, extraocular movements intact  Respiratory: Patient's speak in full sentences and does not appear short of breath  Cardiovascular: No lower extremity edema, non tender, no erythema  Gait MSK:  Back   Osteopathic findings  C2 flexed rotated  and side bent right C6 flexed rotated and side bent left T3 extended rotated and side bent right inhaled rib T9 extended rotated and side bent left L2 flexed rotated and side bent right Sacrum right on right       Assessment and Plan:  No problem-specific Assessment & Plan notes found for this encounter.    Nonallopathic problems  Decision today to treat with OMT was based on Physical Exam  After verbal consent patient was treated with HVLA, ME, FPR techniques in cervical, rib, thoracic, lumbar, and sacral  areas  Patient tolerated the procedure well with improvement in symptoms  Patient given exercises, stretches and lifestyle modifications  See medications in patient instructions if given  Patient will follow up in 4-8 weeks             Note: This dictation was prepared with Dragon dictation along with smaller phrase technology. Any transcriptional errors that result from this process are unintentional.

## 2022-01-19 ENCOUNTER — Ambulatory Visit: Payer: 59 | Admitting: Family Medicine

## 2022-01-22 ENCOUNTER — Other Ambulatory Visit (HOSPITAL_BASED_OUTPATIENT_CLINIC_OR_DEPARTMENT_OTHER): Payer: Self-pay

## 2022-02-09 ENCOUNTER — Other Ambulatory Visit (INDEPENDENT_AMBULATORY_CARE_PROVIDER_SITE_OTHER): Payer: 59

## 2022-02-09 ENCOUNTER — Encounter: Payer: Self-pay | Admitting: Gastroenterology

## 2022-02-09 ENCOUNTER — Ambulatory Visit: Payer: 59 | Admitting: Gastroenterology

## 2022-02-09 ENCOUNTER — Other Ambulatory Visit (HOSPITAL_BASED_OUTPATIENT_CLINIC_OR_DEPARTMENT_OTHER): Payer: Self-pay

## 2022-02-09 ENCOUNTER — Encounter (HOSPITAL_BASED_OUTPATIENT_CLINIC_OR_DEPARTMENT_OTHER): Payer: Self-pay

## 2022-02-09 VITALS — BP 106/64 | HR 65 | Ht 65.0 in | Wt 161.5 lb

## 2022-02-09 DIAGNOSIS — Z800 Family history of malignant neoplasm of digestive organs: Secondary | ICD-10-CM

## 2022-02-09 DIAGNOSIS — K580 Irritable bowel syndrome with diarrhea: Secondary | ICD-10-CM

## 2022-02-09 DIAGNOSIS — Z86010 Personal history of colonic polyps: Secondary | ICD-10-CM

## 2022-02-09 DIAGNOSIS — Z90411 Acquired partial absence of pancreas: Secondary | ICD-10-CM | POA: Diagnosis not present

## 2022-02-09 DIAGNOSIS — D490 Neoplasm of unspecified behavior of digestive system: Secondary | ICD-10-CM

## 2022-02-09 LAB — BASIC METABOLIC PANEL
BUN: 15 mg/dL (ref 6–23)
CO2: 29 mEq/L (ref 19–32)
Calcium: 9.5 mg/dL (ref 8.4–10.5)
Chloride: 104 mEq/L (ref 96–112)
Creatinine, Ser: 0.92 mg/dL (ref 0.40–1.20)
GFR: 65.77 mL/min (ref >60.00)
Glucose, Bld: 98 mg/dL (ref 70–99)
Potassium: 4 mEq/L (ref 3.5–5.1)
Sodium: 139 mEq/L (ref 135–145)

## 2022-02-09 MED ORDER — TRETINOIN 0.05 % EX CREA
TOPICAL_CREAM | CUTANEOUS | 2 refills | Status: AC
  Filled 2022-02-09: qty 45, 30d supply, fill #0

## 2022-02-09 NOTE — Patient Instructions (Addendum)
You have been scheduled for an MRI/MRCP at _____________ on ____________. Your appointment time is _______________. Please arrive to admitting (at main entrance of the hospital) 15 minutes prior to your appointment time for registration purposes. Please make certain not to have anything to eat or drink 6 hours prior to your test. In addition, if you have any metal in your body, have a pacemaker or defibrillator, please be sure to let your ordering physician know. This test typically takes 45 minutes to 1 hour to complete. Should you need to reschedule, please call [phone] to do so.   You will be contacted by [facility] in the next 2 days to arrange a MRI/MRCP.  The number on your caller ID will be [phone], please answer when they call.  If you have not heard from them in 2 days please call [phone] to schedule.     Your provider has requested that you go to the basement level for lab work before leaving today. Press "B" on the elevator. The lab is located at the first door on the left as you exit the elevator.   Continue Benefiber  Due to recent changes in healthcare laws, you may see the results of your imaging and laboratory studies on MyChart before your provider has had a chance to review them.  We understand that in some cases there may be results that are confusing or concerning to you. Not all laboratory results come back in the same time frame and the provider may be waiting for multiple results in order to interpret others.  Please give [place] 48 hours in order for your provider to thoroughly review all the results before contacting the office for clarification of your results.    I appreciate the  opportunity to care for you  Thank You   Harl Bowie , MD

## 2022-02-09 NOTE — Progress Notes (Signed)
[name]    [id number]    08-31-1955  Primary Care Physician:[name], Steva Ready, MD  Referring Physician: Prince Solian, MD [address] [place],  [place] [address]   Chief complaint:  IBS  HPI:  66 year old very pleasant female here for follow-up visit for irritable bowel syndrome.  Overall she is doing much better.  No longer has diarrhea.  Bowel habits have improved with Benefiber daily  Pancreas fecal elastase within normal range   She was treated empirically with Xifaxan for IBS diarrhea with some improvement in the past   Denies any rectal bleeding or melena.  No abdominal pain.   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 Oneida Alar   3 mm tubular adenoma removed from cecum, diverticulosis and internal hemorrhoids   Family history of colon cancer in mother   CT abdomen pelvis with contrast January 2021: Negative for any acute pathology, no new pancreatic cysts or lesions.   Outpatient Encounter Medications as of 02/09/2022  Medication Sig   Diclofenac Sodium 2 % SOLN Place 2 g onto the skin 2 (two) times daily.   dimenhyDRINATE (DRAMAMINE) 50 MG tablet Take 50 mg by mouth in the morning and at bedtime.   estradiol (VIVELLE-DOT) 0.025 MG/24HR Apply 1 patch twice a week by transdermal route   fexofenadine (ALLEGRA) 180 MG tablet Take 180 mg by mouth daily as needed for allergies or rhinitis.   ipratropium (ATROVENT) 0.06 % nasal spray INSTILL 1 TO 2 SPRAYS IN EACH NOSTRIL 3-4 TIMES A DAY AS NEEDED   levocetirizine (XYZAL) 5 MG tablet Take 1 tablet (5 mg total) by mouth daily in the evening   MELATONIN PO Take 1 tablet by mouth at bedtime.   meloxicam (MOBIC) 7.5 MG tablet TAKE 1 TABLET (7.5 MG TOTAL) BY MOUTH DAILY. (Patient taking differently: Take by mouth daily. As needed)   montelukast (SINGULAIR) 10 MG tablet Take 1 tablet by mouth once daily   RESTASIS 0.05 % ophthalmic emulsion Place 1 drop  into both eyes 2 (two) times daily.   tretinoin (RETIN-A) 0.05 % cream Apply 1 application on the skin NIGHTLY   Wheat Dextrin (BENEFIBER PO) Take 1 packet by mouth daily.   ascorbic acid (VITAMIN C) 100 MG tablet Take 100 mg by mouth daily.  (Patient not taking: Reported on 02/09/2022)   b complex vitamins tablet Take 1 tablet by mouth daily. (Patient not taking: Reported on 02/09/2022)   Calcium Carb-Cholecalciferol 600-800 MG-UNIT TABS Take 1 tablet by mouth daily. (Patient not taking: Reported on 02/09/2022)   Lactobacillus (ACIDOPHILUS PO) Take 1 capsule by mouth daily. (Patient not taking: Reported on 02/09/2022)   Multiple Vitamin (MULTIVITAMIN) tablet Take 1 tablet by mouth daily. (Patient not taking: Reported on 02/09/2022)   Omega-3 Fatty Acids (FISH OIL PO) Take 1 capsule by mouth daily. (Patient not taking: Reported on 02/09/2022)   [DISCONTINUED] AMBULATORY NON FORMULARY MEDICATION Medication Name: Nitroglycerin ointment 0.125% use pea sized amount three times a day for 6-8 weeks (Patient not taking: Reported on 02/09/2022)   [DISCONTINUED] amoxicillin-clavulanate (AUGMENTIN) 875-125 MG tablet 1 tablet Orally bid with food 10 day(s) (Patient not taking: Reported on 02/09/2022)   [DISCONTINUED] estradiol (VIVELLE-DOT) 0.025 MG/24HR APPLY 1 PATCH TO SKIN TWICE A WEEK (Patient not taking: Reported on 02/09/2022)   [DISCONTINUED] fexofenadine-pseudoephedrine (ALLEGRA-D) 60-120 MG per tablet Take 1 tablet by mouth daily. (Patient not taking: Reported  on 02/09/2022)   [DISCONTINUED] influenza vac split quadrivalent PF (FLUARIX) 0.5 ML injection Inject into the muscle. (Patient not taking: Reported on 02/09/2022)   [DISCONTINUED] ipratropium (ATROVENT) 0.06 % nasal spray INSTILL 1 TO 2 SPRAYS IN EACH NOSTRIL 3-4 TIMES A DAY AS NEEDED (Patient not taking: Reported on 02/09/2022)   [DISCONTINUED] molnupiravir EUA (LAGEVRIO) 200 MG CAPS capsule 4 capsules Orally every 12 hrs 5 day(s) (Patient not taking:  Reported on 02/09/2022)   [DISCONTINUED] triamcinolone cream (KENALOG) 0.1 % Apply 1 application to affected area three times daily for 10 days; do not place on face or genitals (Patient not taking: Reported on 02/09/2022)   No facility-administered encounter medications on file as of 02/09/2022.    Allergies as of 02/09/2022   (No Known Allergies)    Past Medical History:  Diagnosis Date   Basal cell carcinoma    skin cancer   Colon polyps    Diverticulosis    CT   Fibroids 09/07/2013   IBS (irritable bowel syndrome)    PONV (postoperative nausea and vomiting)    non recent    S/P hysterectomy with oophorectomy 09/07/2013   Seasonal allergies    SVD (spontaneous vaginal delivery)    x 1    Past Surgical History:  Procedure Laterality Date   ABDOMINAL HYSTERECTOMY     COLONOSCOPY     2 or 3   COLONOSCOPY N/A 01/04/2017   Dr. Oneida Alar: Significant looping of the rectosigmoid colon, 4 mm hyperplastic polyp removed from rectum, internal hemorrhoids.  Next colonoscopy in 5 to 10 years with MAC and ultra slim colonoscope.   COLONOSCOPY WITH PROPOFOL N/A 12/05/2019   Procedure: COLONOSCOPY WITH PROPOFOL;  Surgeon: Danie Binder, MD;  [facility];  Service: Endoscopy;  Laterality: N/A;  7:30am   DILATION AND CURETTAGE OF UTERUS     x 3   EUS N/A 11/16/2013   Dr. Ardis Hughs: parenchyma in tail of pancreas suggests chronic pancreatitis, abrupt changes in main pancreatic duct in tail becomes dilated up to 4-41m, numerous associated dilated side branches, no solid mass. bx most c/w IPMN   EYE SURGERY     lasik bilateral   LAPAROSCOPY N/A 09/25/2013   Procedure: LAPAROSCOPY OPERATIVE;  Surgeon: VElveria Royals MD;  [facility];  Service: Gynecology;  Laterality: N/A;   PANCREATECTOMY N/A 12/07/2013   Procedure: LAPAROSCOPIC DISTAL  PANCREATECTOMY;  Surgeon: FStark Klein MD;  [facility]  Service: General;  Laterality: N/A;   POLYPECTOMY  01/04/2017   Procedure: POLYPECTOMY;   Surgeon: FDanie Binder MD;  [facility];  Service: Endoscopy;;  rectal   POLYPECTOMY  12/05/2019   Procedure: POLYPECTOMY;  Surgeon: FDanie Binder MD;  [facility];  Service: Endoscopy;;   ROBOTIC ASSISTED TOTAL HYSTERECTOMY WITH BILATERAL SALPINGO OOPHERECTOMY Bilateral 09/07/2013   Procedure: ROBOTIC ASSISTED TOTAL HYSTERECTOMY WITH BILATERAL SALPINGO OOPHORECTOMY;  Surgeon: VElveria Royals MD;  [facility];  Service: Gynecology;  Laterality: Bilateral;   WISDOM TOOTH EXTRACTION      Family History  Problem Relation Age of Onset   COPD Mother    Colon cancer Mother    Lung cancer Mother 770  Diabetes Brother    Diverticulitis Brother    Heart disease Maternal Grandfather    Stomach cancer Neg Hx    Esophageal cancer Neg Hx    Pancreatic cancer Neg Hx     Social History   Socioeconomic History   Marital status:  Married    Spouse name: Not on file   Number of children: 1   Years of education: Not on file   Highest education level: Not on file  Occupational History   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[employer], VP Advertising account executive  Tobacco Use   Smoking status: Former    Packs/day: 1.00    Years: 10.00    Total pack years: 10.00    Types: Cigarettes    Quit date: 01/02/1987    Years since quitting: 35.1   Smokeless tobacco: Never  Vaping Use   Vaping Use: Never used  Substance and Sexual Activity   Alcohol use: Yes    Comment: 2-3 glases of wine on weekend nights   Drug use: No   Sexual activity: Not Currently    Birth control/protection: Post-menopausal, Surgical  Other Topics Concern   Not on file  Social History Narrative   Not on file   Social Determinants of Health   Financial Resource Strain: Not on file  Food Insecurity: Not on file  Transportation Needs: Not on file  Physical Activity: Not on file  Stress: Not on file  Social Connections: Not on file  Intimate Partner Violence: Not on  file      Review of systems: All other review of systems negative except as mentioned in the HPI.   Physical Exam: Vitals:   02/09/22 1357  BP: 106/64  Pulse: 65   Body mass index is 26.88 kg/m. Gen:      No acute distress HEENT:  sclera anicteric Abd:      soft, non-tender; no palpable masses, no distension Ext:    No edema Neuro: alert and oriented x 3 Psych: normal mood and affect  Data Reviewed:  Reviewed labs, radiology imaging, old records and pertinent past GI work up   Assessment and Plan/Recommendations:  66 year old very pleasant female with complaints of change in bowel habits, intermittent diarrhea likely irritable bowel syndrome predominant diarrhea Improved with dietary modifications and Benefiber Continue with high-fiber diet and increase water intake    Pancreatic IPMN s/p distal pancreatectomy 2015.  She has been getting surveillance CT early, no new pancreatic lesions, overall low risk.   We will schedule MRI MRCP for surveillance  Personal history of colon polyps and family history of colon cancer: Due for recall colonoscopy May 2026   Return in 3 months   The patient was provided an opportunity to ask questions and all were answered. The patient agreed with the plan and demonstrated an understanding of the instructions.  Damaris Hippo , MD    CC: Prince Solian, MD

## 2022-02-09 NOTE — Progress Notes (Signed)
[place] Phone: [phone] Subjective:   [name], am serving as a scribe for Dr. Hulan Saas.  I'm seeing this patient by the request  of:  [name], [name], MD  CC: Hip pain being the worst  [id number]  11/20/2021 Patient given injection and tolerated the procedure well, discussed icing regimen and home exercises.  Hopefully patient does get some improvement.  Patient has had difficulty with the knee and could consider the possibility of advanced imaging but it does seem to be very intermittent and not stopping her from significant amount of activity.  Discussed icing regimen.  Increase activity as tolerated and follow-up again in 6 weeks  OMT as well  Updated 02/13/2022 BATYA CITRON is a 66 y.o. female coming in with complaint of R hip pain. Pain is over greater trochanter. Painful to lie on that side. Meloxicam in bursts does help but she tried to be mindful and not overuse it. Using Voltaren as well.   Shoulder and knee are doing much better.        Past Medical History:  Diagnosis Date   Basal cell carcinoma    skin cancer   Colon polyps    Diverticulosis    CT   Fibroids 09/07/2013   IBS (irritable bowel syndrome)    PONV (postoperative nausea and vomiting)    non recent    S/P hysterectomy with oophorectomy 09/07/2013   Seasonal allergies    SVD (spontaneous vaginal delivery)    x 1   Past Surgical History:  Procedure Laterality Date   ABDOMINAL HYSTERECTOMY     COLONOSCOPY     2 or 3   COLONOSCOPY N/A 01/04/2017   Dr. Oneida Alar: Significant looping of the rectosigmoid colon, 4 mm hyperplastic polyp removed from rectum, internal hemorrhoids.  Next colonoscopy in 5 to 10 years with MAC and ultra slim colonoscope.   COLONOSCOPY WITH PROPOFOL N/A 12/05/2019   Procedure: COLONOSCOPY WITH PROPOFOL;  Surgeon: Danie Binder, MD;  [facility];  Service: Endoscopy;   Laterality: N/A;  7:30am   DILATION AND CURETTAGE OF UTERUS     x 3   EUS N/A 11/16/2013   Dr. Ardis Hughs: parenchyma in tail of pancreas suggests chronic pancreatitis, abrupt changes in main pancreatic duct in tail becomes dilated up to 4-30m, numerous associated dilated side branches, no solid mass. bx most c/w IPMN   EYE SURGERY     lasik bilateral   LAPAROSCOPY N/A 09/25/2013   Procedure: LAPAROSCOPY OPERATIVE;  Surgeon: VElveria Royals MD;  [facility];  Service: Gynecology;  Laterality: N/A;   PANCREATECTOMY N/A 12/07/2013   Procedure: LAPAROSCOPIC DISTAL  PANCREATECTOMY;  Surgeon: FStark Klein MD;  [facility]  Service: General;  Laterality: N/A;   POLYPECTOMY  01/04/2017   Procedure: POLYPECTOMY;  Surgeon: FDanie Binder MD;  [facility];  Service: Endoscopy;;  rectal   POLYPECTOMY  12/05/2019   Procedure: POLYPECTOMY;  Surgeon: FDanie Binder MD;  [facility];  Service: Endoscopy;;   ROBOTIC ASSISTED TOTAL HYSTERECTOMY WITH BILATERAL SALPINGO OOPHERECTOMY Bilateral 09/07/2013   Procedure: ROBOTIC ASSISTED TOTAL HYSTERECTOMY WITH BILATERAL SALPINGO OOPHORECTOMY;  Surgeon: VElveria Royals MD;  [facility];  Service: Gynecology;  Laterality: Bilateral;   WISDOM TOOTH EXTRACTION     Social History   Socioeconomic History   Marital status: Married    Spouse name: Not on file   Number  of children: 1   Years of education: Not on file   Highest education level: Not on file  Occupational History   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[employer], VP Advertising account executive  Tobacco Use   Smoking status: Former    Packs/day: 1.00    Years: 10.00    Total pack years: 10.00    Types: Cigarettes    Quit date: 01/02/1987    Years since quitting: 35.1   Smokeless tobacco: Never  Vaping Use   Vaping Use: Never used  Substance and Sexual Activity   Alcohol use: Yes    Comment: 2-3 glases of wine on weekend nights   Drug use: No    Sexual activity: Not Currently    Birth control/protection: Post-menopausal, Surgical  Other Topics Concern   Not on file  Social History Narrative   Not on file   Social Determinants of Health   Financial Resource Strain: Not on file  Food Insecurity: Not on file  Transportation Needs: Not on file  Physical Activity: Not on file  Stress: Not on file  Social Connections: Not on file   No Known Allergies Family History  Problem Relation Age of Onset   COPD Mother    Colon cancer Mother    Lung cancer Mother 22   Diabetes Brother    Diverticulitis Brother    Heart disease Maternal Grandfather    Stomach cancer Neg Hx    Esophageal cancer Neg Hx    Pancreatic cancer Neg Hx     Current Outpatient Medications (Endocrine & Metabolic):    estradiol (VIVELLE-DOT) 0.025 MG/24HR, Apply 1 patch twice a week by transdermal route   Current Outpatient Medications (Respiratory):    fexofenadine (ALLEGRA) 180 MG tablet, Take 180 mg by mouth daily as needed for allergies or rhinitis.   ipratropium (ATROVENT) 0.06 % nasal spray, INSTILL 1 TO 2 SPRAYS IN EACH NOSTRIL 3-4 TIMES A DAY AS NEEDED   levocetirizine (XYZAL) 5 MG tablet, Take 1 tablet (5 mg total) by mouth daily in the evening   montelukast (SINGULAIR) 10 MG tablet, Take 1 tablet by mouth once daily  Current Outpatient Medications (Analgesics):    meloxicam (MOBIC) 7.5 MG tablet, TAKE 1 TABLET (7.5 MG TOTAL) BY MOUTH DAILY.   Current Outpatient Medications (Other):    ascorbic acid (VITAMIN C) 100 MG tablet, Take 100 mg by mouth daily.  (Patient not taking: Reported on 02/09/2022)   b complex vitamins tablet, Take 1 tablet by mouth daily. (Patient not taking: Reported on 02/09/2022)   Calcium Carb-Cholecalciferol 600-800 MG-UNIT TABS, Take 1 tablet by mouth daily. (Patient not taking: Reported on 02/09/2022)   Diclofenac Sodium 2 % SOLN, Place 2 g onto the skin 2 (two) times daily.   dimenhyDRINATE (DRAMAMINE) 50 MG tablet,  Take 50 mg by mouth in the morning and at bedtime.   Lactobacillus (ACIDOPHILUS PO), Take 1 capsule by mouth daily. (Patient not taking: Reported on 02/09/2022)   MELATONIN PO, Take 1 tablet by mouth at bedtime.   Multiple Vitamin (MULTIVITAMIN) tablet, Take 1 tablet by mouth daily. (Patient not taking: Reported on 02/09/2022)   Omega-3 Fatty Acids (FISH OIL PO), Take 1 capsule by mouth daily. (Patient not taking: Reported on 02/09/2022)   RESTASIS 0.05 % ophthalmic emulsion, Place 1 drop into both eyes 2 (two) times daily.   tretinoin (RETIN-A) 0.05 % cream, Apply 1 application on the skin NIGHTLY   Wheat Dextrin (BENEFIBER  PO), Take 1 packet by mouth daily.   Reviewed prior external information including notes and imaging from  primary care provider As well as notes that were available from care everywhere and other healthcare systems.  Past medical history, social, surgical and family history all reviewed in electronic medical record.  No pertanent information unless stated regarding to the chief complaint.   Review of Systems:  No headache, visual changes, nausea, vomiting, diarrhea, constipation, dizziness, abdominal pain, skin rash, fevers, chills, night sweats, weight loss, swollen lymph nodes, body aches, joint swelling, chest pain, shortness of breath, mood changes. POSITIVE muscle aches  Objective  Blood pressure 120/78, pulse 76, height _0  (1.651 m), SpO2 98 %.   General: No apparent distress alert and oriented x3 mood and affect normal, dressed appropriately.  HEENT: Pupils equal, extraocular movements intact  Respiratory: Patient's speak in full sentences and does not appear short of breath  Cardiovascular: No lower extremity edema, non tender, no erythema  Right hip exam does have severe tenderness to palpation over the greater trochanteric area.  Patient has some difficulty with FABER test.  Negative straight leg test noted.   Procedure: Real-time Ultrasound Guided  Injection of right greater trochanteric bursitis secondary to patient's body habitus Device: GE Logiq Q7 Ultrasound guided injection is preferred based studies that show increased duration, increased effect, greater accuracy, decreased procedural pain, increased response rate, and decreased cost with ultrasound guided versus blind injection.  Verbal informed consent obtained.  Time-out conducted.  Noted no overlying erythema, induration, or other signs of local infection.  Skin prepped in a sterile fashion.  Local anesthesia: Topical Ethyl chloride.  With sterile technique and under real time ultrasound guidance:  Greater trochanteric area was visualized and patient's bursa was noted. A 22-gauge 3 inch needle was inserted and 2 cc of 0.5% Marcaine and 1 cc of Kenalog 40 mg/dL was injected. Pictures taken Completed without difficulty  Pain immediately resolved suggesting accurate placement of the medication.  Advised to call if fevers/chills, erythema, induration, drainage, or persistent bleeding.  Impression: Technically successful ultrasound guided injection.    Impression and Recommendations:     The above documentation has been reviewed and is accurate and complete [name], DO

## 2022-02-12 ENCOUNTER — Other Ambulatory Visit: Payer: Self-pay | Admitting: Family Medicine

## 2022-02-12 ENCOUNTER — Other Ambulatory Visit (HOSPITAL_BASED_OUTPATIENT_CLINIC_OR_DEPARTMENT_OTHER): Payer: Self-pay

## 2022-02-12 MED ORDER — MELOXICAM 7.5 MG PO TABS
ORAL_TABLET | Freq: Every day | ORAL | 0 refills | Status: DC
  Filled 2022-02-12: qty 90, 90d supply, fill #0

## 2022-02-13 ENCOUNTER — Ambulatory Visit: Payer: Self-pay

## 2022-02-13 ENCOUNTER — Other Ambulatory Visit (HOSPITAL_BASED_OUTPATIENT_CLINIC_OR_DEPARTMENT_OTHER): Payer: Self-pay

## 2022-02-13 ENCOUNTER — Ambulatory Visit (INDEPENDENT_AMBULATORY_CARE_PROVIDER_SITE_OTHER): Payer: 59 | Admitting: Family Medicine

## 2022-02-13 VITALS — BP 120/78 | HR 76 | Ht 65.0 in

## 2022-02-13 DIAGNOSIS — M7062 Trochanteric bursitis, left hip: Secondary | ICD-10-CM | POA: Diagnosis not present

## 2022-02-13 DIAGNOSIS — M7061 Trochanteric bursitis, right hip: Secondary | ICD-10-CM

## 2022-02-13 DIAGNOSIS — M25551 Pain in right hip: Secondary | ICD-10-CM | POA: Diagnosis not present

## 2022-02-13 NOTE — Assessment & Plan Note (Signed)
Patient given a repeat injection again.  Tolerated the procedure well, discussed icing regimen and home exercises.  Discussed that this could be more lumbar radiculopathy and may need to consider further evaluation.  May need to consider epidurals in the back.  Follow-up again in 6 to 8 weeks

## 2022-02-26 ENCOUNTER — Other Ambulatory Visit: Payer: Self-pay | Admitting: Internal Medicine

## 2022-02-26 DIAGNOSIS — Z1231 Encounter for screening mammogram for malignant neoplasm of breast: Secondary | ICD-10-CM

## 2022-03-05 ENCOUNTER — Ambulatory Visit (HOSPITAL_COMMUNITY)
Admission: RE | Admit: 2022-03-05 | Discharge: 2022-03-05 | Disposition: A | Discharge status: Home / Self Care | Payer: 59 | Source: Ambulatory Visit | Attending: Gastroenterology | Admitting: Gastroenterology

## 2022-03-05 ENCOUNTER — Other Ambulatory Visit: Payer: Self-pay | Admitting: Gastroenterology

## 2022-03-05 ENCOUNTER — Other Ambulatory Visit (HOSPITAL_BASED_OUTPATIENT_CLINIC_OR_DEPARTMENT_OTHER): Payer: Self-pay

## 2022-03-05 DIAGNOSIS — D735 Infarction of spleen: Secondary | ICD-10-CM | POA: Diagnosis not present

## 2022-03-05 DIAGNOSIS — K8689 Other specified diseases of pancreas: Secondary | ICD-10-CM | POA: Diagnosis not present

## 2022-03-05 DIAGNOSIS — D490 Neoplasm of unspecified behavior of digestive system: Secondary | ICD-10-CM

## 2022-03-05 DIAGNOSIS — I700 Atherosclerosis of aorta: Secondary | ICD-10-CM | POA: Diagnosis not present

## 2022-03-05 DIAGNOSIS — R935 Abnormal findings on diagnostic imaging of other abdominal regions, including retroperitoneum: Secondary | ICD-10-CM | POA: Diagnosis not present

## 2022-03-05 MED ORDER — FLUTICASONE PROPIONATE 50 MCG/ACT NA SUSP
NASAL | 5 refills | Status: AC
Start: 2022-03-05 — End: ?
  Filled 2022-03-05: qty 16, 30d supply, fill #0
  Filled 2022-05-18: qty 16, 30d supply, fill #1
  Filled 2022-07-02: qty 16, 30d supply, fill #2
  Filled 2022-10-30: qty 16, 30d supply, fill #3

## 2022-03-05 MED ORDER — GADOBUTROL 1 MMOL/ML IV SOLN
7.5000 mL | Freq: Once | INTRAVENOUS | Status: AC | PRN
Start: 2022-03-05 — End: 2022-03-05
  Administered 2022-03-05: 7.5 mL via INTRAVENOUS

## 2022-03-12 ENCOUNTER — Encounter: Payer: Self-pay | Admitting: Gastroenterology

## 2022-03-13 ENCOUNTER — Other Ambulatory Visit (HOSPITAL_BASED_OUTPATIENT_CLINIC_OR_DEPARTMENT_OTHER): Payer: Self-pay

## 2022-03-19 ENCOUNTER — Ambulatory Visit
Admission: RE | Admit: 2022-03-19 | Discharge: 2022-03-19 | Disposition: A | Discharge status: Home / Self Care | Payer: 59 | Source: Ambulatory Visit | Attending: Internal Medicine | Admitting: Internal Medicine

## 2022-03-19 DIAGNOSIS — Z1231 Encounter for screening mammogram for malignant neoplasm of breast: Secondary | ICD-10-CM | POA: Diagnosis not present

## 2022-03-25 NOTE — Progress Notes (Unsigned)
[name] [address] [place] Phone: [phone] Subjective:   [name], am serving as a scribe for Dr. Hulan Saas.  I'm seeing this patient by the request  of:  [name], [name], MD  CC: Back and neck pain follow-up  [id number]  [name] is a 66 y.o. female coming in with complaint of back and neck pain. OMT 02/13/2022 and f/u for R hip pain. Injected GT last visit. Patient states has been doing great since GT injection. No new issues.  Medications patient has been prescribed: Meloxicam  Taking:         Reviewed prior external information including notes and imaging from previsou exam, outside providers and external EMR if available.   As well as notes that were available from care everywhere and other healthcare systems.  Past medical history, social, surgical and family history all reviewed in electronic medical record.  No pertanent information unless stated regarding to the chief complaint.   Past Medical History:  Diagnosis Date   Basal cell carcinoma    skin cancer   Colon polyps    Diverticulosis    CT   Fibroids 09/07/2013   IBS (irritable bowel syndrome)    PONV (postoperative nausea and vomiting)    non recent    S/P hysterectomy with oophorectomy 09/07/2013   Seasonal allergies    SVD (spontaneous vaginal delivery)    x 1    No Known Allergies   Review of Systems:  No headache, visual changes, nausea, vomiting, diarrhea, constipation, dizziness, abdominal pain, skin rash, fevers, chills, night sweats, weight loss, swollen lymph nodes, body aches, joint swelling, chest pain, shortness of breath, mood changes. POSITIVE muscle aches  Objective  Blood pressure 116/72, pulse 75, height '5\' 5"'$  (1.651 m), SpO2 95 %.   General: No apparent distress alert and oriented x3 mood and affect normal, dressed appropriately.  HEENT: Pupils equal, extraocular movements intact  Respiratory:  Patient's speak in full sentences and does not appear short of breath  Cardiovascular: No lower extremity edema, non tender, no erythema  Gait MSK:  Back does have loss of lordosis.  Some tenderness to palpation in the paraspinal musculature.  Tender over the gluteal area bilaterally significantly less than previous exam.  Lacks last 5 degrees of extension of the back  Osteopathic findings  C2 flexed rotated and side bent right C6 flexed rotated and side bent left T3 extended rotated and side bent right inhaled rib T8 extended rotated and side bent left L2 flexed rotated and side bent right L5 flexed rotated and side bent left Sacrum right on right       Assessment and Plan:  Greater trochanteric bursitis of both hips Patient is doing much better overall at this time.  Responding extremely well to the injections.  The patient's back is doing much better as well even though she has started increasing training.  Patient is also doing Pilates and working on core strength.  Discussed most medications such as oral anti-inflammatories, meloxicam follow-up again in 8-12 weeks    Nonallopathic problems  Decision today to treat with OMT was based on Physical Exam  After verbal consent patient was treated with HVLA, ME, FPR techniques in cervical, rib, thoracic, lumbar, and sacral  areas  Patient tolerated the procedure well with improvement in symptoms  Patient given exercises, stretches and lifestyle modifications  See medications in patient instructions if given  Patient will follow up in 4-8 weeks  Note: This dictation was prepared with Dragon dictation along with smaller phrase technology. Any transcriptional errors that result from this process are unintentional.

## 2022-03-26 ENCOUNTER — Ambulatory Visit: Payer: 59 | Admitting: Family Medicine

## 2022-03-26 VITALS — BP 116/72 | HR 75 | Ht 65.0 in

## 2022-03-26 DIAGNOSIS — M7061 Trochanteric bursitis, right hip: Secondary | ICD-10-CM | POA: Diagnosis not present

## 2022-03-26 DIAGNOSIS — M7062 Trochanteric bursitis, left hip: Secondary | ICD-10-CM | POA: Diagnosis not present

## 2022-03-26 DIAGNOSIS — M9904 Segmental and somatic dysfunction of sacral region: Secondary | ICD-10-CM

## 2022-03-26 DIAGNOSIS — M9908 Segmental and somatic dysfunction of rib cage: Secondary | ICD-10-CM | POA: Diagnosis not present

## 2022-03-26 DIAGNOSIS — M9902 Segmental and somatic dysfunction of thoracic region: Secondary | ICD-10-CM

## 2022-03-26 DIAGNOSIS — M9903 Segmental and somatic dysfunction of lumbar region: Secondary | ICD-10-CM

## 2022-03-26 DIAGNOSIS — M9901 Segmental and somatic dysfunction of cervical region: Secondary | ICD-10-CM

## 2022-03-26 NOTE — Patient Instructions (Signed)
Good to see you! Coop pillow Otherwise doing so much better

## 2022-03-26 NOTE — Assessment & Plan Note (Signed)
Patient is doing much better overall at this time.  Responding extremely well to the injections.  The patient's back is doing much better as well even though she has started increasing training.  Patient is also doing Pilates and working on core strength.  Discussed most medications such as oral anti-inflammatories, meloxicam follow-up again in 8-12 weeks

## 2022-04-08 ENCOUNTER — Other Ambulatory Visit (HOSPITAL_BASED_OUTPATIENT_CLINIC_OR_DEPARTMENT_OTHER): Payer: Self-pay

## 2022-04-08 DIAGNOSIS — H6991 Unspecified Eustachian tube disorder, right ear: Secondary | ICD-10-CM | POA: Diagnosis not present

## 2022-04-08 DIAGNOSIS — J0100 Acute maxillary sinusitis, unspecified: Secondary | ICD-10-CM | POA: Diagnosis not present

## 2022-04-08 DIAGNOSIS — J309 Allergic rhinitis, unspecified: Secondary | ICD-10-CM | POA: Diagnosis not present

## 2022-04-08 MED ORDER — PREDNISONE 20 MG PO TABS
20.0000 mg | ORAL_TABLET | Freq: Every day | ORAL | 0 refills | Status: AC
  Filled 2022-04-08: qty 5, 5d supply, fill #0

## 2022-04-08 MED ORDER — AMOXICILLIN-POT CLAVULANATE 875-125 MG PO TABS
1.0000 | ORAL_TABLET | Freq: Two times a day (BID) | ORAL | 0 refills | Status: AC
  Filled 2022-04-08: qty 20, 10d supply, fill #0

## 2022-04-16 ENCOUNTER — Other Ambulatory Visit (HOSPITAL_BASED_OUTPATIENT_CLINIC_OR_DEPARTMENT_OTHER): Payer: Self-pay

## 2022-04-21 ENCOUNTER — Other Ambulatory Visit (HOSPITAL_BASED_OUTPATIENT_CLINIC_OR_DEPARTMENT_OTHER): Payer: Self-pay

## 2022-05-15 ENCOUNTER — Other Ambulatory Visit (HOSPITAL_BASED_OUTPATIENT_CLINIC_OR_DEPARTMENT_OTHER): Payer: Self-pay

## 2022-05-15 MED ORDER — AREXVY 120 MCG/0.5ML IM SUSR
INTRAMUSCULAR | 0 refills | Status: AC
  Filled 2022-05-15: qty 0.5, 1d supply, fill #0

## 2022-05-16 ENCOUNTER — Other Ambulatory Visit (HOSPITAL_BASED_OUTPATIENT_CLINIC_OR_DEPARTMENT_OTHER): Payer: Self-pay

## 2022-05-18 ENCOUNTER — Other Ambulatory Visit (HOSPITAL_BASED_OUTPATIENT_CLINIC_OR_DEPARTMENT_OTHER): Payer: Self-pay

## 2022-05-21 ENCOUNTER — Other Ambulatory Visit (HOSPITAL_BASED_OUTPATIENT_CLINIC_OR_DEPARTMENT_OTHER): Payer: Self-pay

## 2022-07-02 ENCOUNTER — Other Ambulatory Visit (HOSPITAL_BASED_OUTPATIENT_CLINIC_OR_DEPARTMENT_OTHER): Payer: Self-pay

## 2022-07-02 MED ORDER — MONTELUKAST SODIUM 10 MG PO TABS
10.0000 mg | ORAL_TABLET | Freq: Every day | ORAL | 3 refills | Status: DC
  Filled 2022-07-02: qty 90, 90d supply, fill #0
  Filled 2022-11-05: qty 90, 90d supply, fill #1
  Filled 2023-02-11 (×2): qty 90, 90d supply, fill #2
  Filled 2023-06-18: qty 90, 90d supply, fill #3

## 2022-07-03 ENCOUNTER — Other Ambulatory Visit (HOSPITAL_BASED_OUTPATIENT_CLINIC_OR_DEPARTMENT_OTHER): Payer: Self-pay

## 2022-08-06 ENCOUNTER — Ambulatory Visit: Payer: Commercial Managed Care - PPO | Admitting: Family Medicine

## 2022-08-06 NOTE — Progress Notes (Deleted)
  [place] Phone: [phone] Subjective:    I'm seeing this patient by the request  of:  [name], [name], MD  CC:   [id number]  [name] is a 67 y.o. female coming in with complaint of back and neck pain. OMT 03/26/2022. Patient states   Medications patient has been prescribed: None  Taking:         Reviewed prior external information including notes and imaging from previsou exam, outside providers and external EMR if available.   As well as notes that were available from care everywhere and other healthcare systems.  Past medical history, social, surgical and family history all reviewed in electronic medical record.  No pertanent information unless stated regarding to the chief complaint.   Past Medical History:  Diagnosis Date   Basal cell carcinoma    skin cancer   Colon polyps    Diverticulosis    CT   Fibroids 09/07/2013   IBS (irritable bowel syndrome)    PONV (postoperative nausea and vomiting)    non recent    S/P hysterectomy with oophorectomy 09/07/2013   Seasonal allergies    SVD (spontaneous vaginal delivery)    x 1    No Known Allergies   Review of Systems:  No headache, visual changes, nausea, vomiting, diarrhea, constipation, dizziness, abdominal pain, skin rash, fevers, chills, night sweats, weight loss, swollen lymph nodes, body aches, joint swelling, chest pain, shortness of breath, mood changes. POSITIVE muscle aches  Objective  There were no vitals taken for this visit.   General: No apparent distress alert and oriented x3 mood and affect normal, dressed appropriately.  HEENT: Pupils equal, extraocular movements intact  Respiratory: Patient's speak in full sentences and does not appear short of breath  Cardiovascular: No lower extremity edema, non tender, no erythema  Gait MSK:  Back   Osteopathic findings  C2 flexed rotated and side bent right C6  flexed rotated and side bent left T3 extended rotated and side bent right inhaled rib T9 extended rotated and side bent left L2 flexed rotated and side bent right Sacrum right on right       Assessment and Plan:  No problem-specific Assessment & Plan notes found for this encounter.    Nonallopathic problems  Decision today to treat with OMT was based on Physical Exam  After verbal consent patient was treated with HVLA, ME, FPR techniques in cervical, rib, thoracic, lumbar, and sacral  areas  Patient tolerated the procedure well with improvement in symptoms  Patient given exercises, stretches and lifestyle modifications  See medications in patient instructions if given  Patient will follow up in 4-8 weeks             Note: This dictation was prepared with Dragon dictation along with smaller phrase technology. Any transcriptional errors that result from this process are unintentional.

## 2022-08-13 ENCOUNTER — Ambulatory Visit: Payer: Commercial Managed Care - PPO | Admitting: Family Medicine

## 2022-08-16 ENCOUNTER — Telehealth: Payer: Medicare Other | Admitting: Family Medicine

## 2022-08-16 DIAGNOSIS — H1031 Unspecified acute conjunctivitis, right eye: Secondary | ICD-10-CM | POA: Diagnosis not present

## 2022-08-16 MED ORDER — POLYMYXIN B-TRIMETHOPRIM 10000-0.1 UNIT/ML-% OP SOLN: 1.0000 [drp] | OPHTHALMIC | 0 refills | Status: AC

## 2022-08-16 NOTE — Progress Notes (Signed)
Virtual Visit Consent   [name], you are scheduled for a virtual visit with a [facility] provider today. Just as with appointments in the office, your consent must be obtained to participate. Your consent will be active for this visit and any virtual visit you may have with one of our providers in the next 365 days. If you have a MyChart account, a copy of this consent can be sent to you electronically.  As this is a virtual visit, video technology does not allow for your provider to perform a traditional examination. This may limit your provider's ability to fully assess your condition. If your provider identifies any concerns that need to be evaluated in person or the need to arrange testing (such as labs, EKG, etc.), we will make arrangements to do so. Although advances in technology are sophisticated, we cannot ensure that it will always work on either your end or our end. If the connection with a video visit is poor, the visit may have to be switched to a telephone visit. With either a video or telephone visit, we are not always able to ensure that we have a secure connection.  By engaging in this virtual visit, you consent to the provision of healthcare and authorize for your insurance to be billed (if applicable) for the services provided during this visit. Depending on your insurance coverage, you may receive a charge related to this service.  I need to obtain your verbal consent now. Are you willing to proceed with your visit today? [name] has provided verbal consent on 08/16/2022 for a virtual visit (video or telephone). Dellia Nims, FNP  Date: 08/16/2022 9:47 AM  Virtual Visit via Video Note   I, Dellia Nims, connected with  [name]  ([id number], Sep 23, 1955) on 08/16/22 at  9:45 AM EST by a video-enabled telemedicine application and verified that I am speaking with the correct person using two identifiers.  Location: Patient: Virtual Visit Location Patient:  Home Provider: Virtual Visit Location Provider: Home Office   I discussed the limitations of evaluation and management by telemedicine and the availability of in person appointments. The patient expressed understanding and agreed to proceed.    History of Present Illness: [name] is a 67 y.o. who identifies as a female who was assigned female at birth, and is being seen today for redness, yellow drainage and itching rt eye since yesterday. She has had cold sx the past week. No fever. No vision changes. Marland Kitchen  HPI: HPI  Problems:  Patient Active Problem List   Diagnosis Date Noted   Contusion of heel, right, initial encounter 10/03/2021   Gluteal tendonitis of right buttock 06/17/2021   Change in bowel habits 12/13/2020   Loose stools 12/13/2020   Polyp of cecum    Neuroma 11/16/2019   LLQ abdominal pain 08/23/2019   Coccydynia 12/30/2018   Nonallopathic lesion of sacral region 03/14/2018   Nonallopathic lesion of rib cage 03/14/2018   Acute lateral meniscal injury of the knee, right, initial encounter 03/14/2018   Colon cancer screening    Arthritis of left acromioclavicular joint 10/15/2016   Whiplash injury syndrome, initial encounter 08/31/2016   Capsulitis of toe of right foot 01/27/2016   Acute medial meniscal injury of right knee 10/22/2015   Greater trochanteric bursitis of both hips 09/09/2015   Leg length discrepancy 09/09/2015   Right medial knee pain 01/03/2015   Low back pain 01/03/2015   Fatigue 10/22/2014   Nonallopathic lesion of  cervical region 10/22/2014   Nonallopathic lesion of thoracic region 10/22/2014   Nonallopathic lesion of lumbosacral region 10/22/2014   Pes anserine bursitis 05/11/2014   Metatarsal stress fracture of right foot 03/27/2014   Knee pain 01/10/2014   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  (Patient not taking: Reported on 02/09/2022), Disp: , Rfl:    b complex vitamins tablet, Take 1 tablet by mouth daily. (Patient not taking: Reported on 02/09/2022), Disp: , Rfl:    Calcium Carb-Cholecalciferol 600-800 MG-UNIT TABS, Take 1 tablet by mouth daily. (Patient not taking: Reported on 02/09/2022), Disp: , Rfl:    Diclofenac Sodium 2 % SOLN, Place 2 g onto the skin 2 (two) times daily., Disp: 112 g, Rfl: 3   dimenhyDRINATE (DRAMAMINE) 50 MG tablet, Take 50 mg by mouth in the morning and at bedtime., Disp: , Rfl:    estradiol (VIVELLE-DOT) 0.025 MG/24HR, Apply 1 patch twice a week by transdermal route, Disp: 24 patch, Rfl: 4   fexofenadine (ALLEGRA) 180 MG tablet, Take 180 mg by mouth daily as needed for allergies or rhinitis., Disp: , Rfl:    fluticasone (FLONASE) 50 MCG/ACT nasal spray, 1 spray in each nostril Nasally Once a day or prn 30 day(s), Disp: 16 g, Rfl: 5   ipratropium (ATROVENT) 0.06 % nasal spray, INSTILL 1 TO 2 SPRAYS IN EACH NOSTRIL 3-4 TIMES A DAY AS NEEDED, Disp: 15 mL, Rfl: 3   Lactobacillus (ACIDOPHILUS PO), Take 1 capsule by mouth daily. (Patient not taking: Reported on 02/09/2022), Disp: , Rfl:    levocetirizine (XYZAL) 5 MG tablet, Take 1 tablet (5 mg total) by mouth daily in the evening, Disp: 30 tablet, Rfl: 3   MELATONIN PO, Take 1 tablet by mouth at bedtime., Disp: , Rfl:    meloxicam (MOBIC) 7.5 MG tablet, TAKE 1 TABLET (7.5 MG TOTAL) BY MOUTH DAILY., Disp: 90 tablet, Rfl: 0   montelukast (SINGULAIR) 10 MG tablet, Take 1 tablet (10 mg total)  by mouth daily., Disp: 90 tablet, Rfl: 3   Multiple Vitamin (MULTIVITAMIN) tablet, Take 1 tablet by mouth daily. (Patient not taking: Reported on 02/09/2022), Disp: , Rfl:    Omega-3 Fatty Acids (FISH OIL PO), Take 1 capsule by mouth daily. (Patient not taking: Reported on 02/09/2022), Disp: , Rfl:    RESTASIS 0.05 % ophthalmic emulsion, Place 1 drop into both eyes 2 (two) times daily., Disp: , Rfl:    RSV vaccine recomb adjuvanted (AREXVY) 120 MCG/0.5ML injection, as directed Intramuscular once, Disp: 0.5 mL, Rfl: 0   tretinoin (RETIN-A) 0.05 % cream, Apply 1 application on the skin NIGHTLY, Disp: 45 g, Rfl: 2   Wheat Dextrin (BENEFIBER PO), Take 1 packet by mouth daily., Disp: , Rfl:   Observations/Objective: Patient is well-developed, well-nourished in no acute distress.  Resting comfortably  at home.  Head is normocephalic, atraumatic.  No labored breathing.  Speech is clear and coherent with logical content.  Patient is alert and oriented at baseline.  Rt eye  is visibly draining with yellow crusting, redness and puffiness.   Assessment and Plan: 1. Acute bacterial conjunctivitis of right eye  Ways to prevent transmission discussed, urgent care if sx worsen.   Follow Up Instructions: I discussed the assessment and treatment plan with the patient. The patient was provided an opportunity to ask questions and all were answered. The patient agreed with the plan and demonstrated an understanding of the instructions.  A copy of instructions were sent to the patient via MyChart unless otherwise noted below.     The patient was advised to call back or seek an in-person evaluation if the symptoms worsen or if the condition fails to improve as anticipated.  Time:  I spent 8 minutes with the patient via telehealth technology discussing the above problems/concerns.    Dellia Nims, FNP

## 2022-08-27 ENCOUNTER — Ambulatory Visit: Payer: Commercial Managed Care - PPO | Admitting: Family Medicine

## 2022-10-23 ENCOUNTER — Other Ambulatory Visit (HOSPITAL_BASED_OUTPATIENT_CLINIC_OR_DEPARTMENT_OTHER): Payer: Self-pay

## 2022-10-24 ENCOUNTER — Other Ambulatory Visit (HOSPITAL_BASED_OUTPATIENT_CLINIC_OR_DEPARTMENT_OTHER): Payer: Self-pay

## 2022-10-30 ENCOUNTER — Other Ambulatory Visit (HOSPITAL_BASED_OUTPATIENT_CLINIC_OR_DEPARTMENT_OTHER): Payer: Self-pay

## 2022-11-05 ENCOUNTER — Other Ambulatory Visit: Payer: Self-pay | Admitting: Family Medicine

## 2022-11-05 ENCOUNTER — Other Ambulatory Visit (HOSPITAL_BASED_OUTPATIENT_CLINIC_OR_DEPARTMENT_OTHER): Payer: Self-pay

## 2022-11-05 MED ORDER — MELOXICAM 7.5 MG PO TABS
7.5000 mg | ORAL_TABLET | Freq: Every day | ORAL | 0 refills | Status: DC
  Filled 2022-11-05: qty 90, 90d supply, fill #0

## 2022-11-06 ENCOUNTER — Other Ambulatory Visit (HOSPITAL_BASED_OUTPATIENT_CLINIC_OR_DEPARTMENT_OTHER): Payer: Self-pay

## 2022-11-19 ENCOUNTER — Other Ambulatory Visit (HOSPITAL_BASED_OUTPATIENT_CLINIC_OR_DEPARTMENT_OTHER): Payer: Self-pay

## 2022-11-19 MED ORDER — AMOXICILLIN 875 MG PO TABS
875.0000 mg | ORAL_TABLET | Freq: Two times a day (BID) | ORAL | 0 refills | Status: DC
  Filled 2022-11-19: qty 20, 10d supply, fill #0

## 2022-11-19 NOTE — Progress Notes (Unsigned)
[name] [address] [place] [address] Phone: [phone] Subjective:   Bruce Donath, am serving as a scribe for Dr. Antoine Primas.  I'm seeing this patient by the request  of:  [name], [name], MD  CC: Right knee and right hip pain  [id number]  03/26/2022 Patient is doing much better overall at this time.  Responding extremely well to the injections.  The patient's back is doing much better as well even though she has started increasing training.  Patient is also doing Pilates and working on core strength.  Discussed most medications such as oral anti-inflammatories, meloxicam follow-up again in 8-12 weeks     Updated 11/23/2022 [name] is a 67 y.o. female coming in with complaint of R knee and R hip pain. Both joints are doing much better.   Pain in R hand ring finger for past month. Pain in the PIP. Able to make a fist but it is painful. Insidious onset.     Past Medical History:  Diagnosis Date   Basal cell carcinoma    skin cancer   Colon polyps    Diverticulosis    CT   Fibroids 09/07/2013   IBS (irritable bowel syndrome)    PONV (postoperative nausea and vomiting)    non recent    S/P hysterectomy with oophorectomy 09/07/2013   Seasonal allergies    SVD (spontaneous vaginal delivery)    x 1   Past Surgical History:  Procedure Laterality Date   ABDOMINAL HYSTERECTOMY     COLONOSCOPY     2 or 3   COLONOSCOPY N/A 01/04/2017   Dr. Darrick Penna: Significant looping of the rectosigmoid colon, 4 mm hyperplastic polyp removed from rectum, internal hemorrhoids.  Next colonoscopy in 5 to 10 years with MAC and ultra slim colonoscope.   COLONOSCOPY WITH PROPOFOL N/A 12/05/2019   Procedure: COLONOSCOPY WITH PROPOFOL;  Surgeon: West Bali, MD;  Location: [facility];  Service: Endoscopy;  Laterality: N/A;  7:30am   DILATION AND CURETTAGE OF UTERUS     x 3   EUS N/A 11/16/2013   Dr. Christella Hartigan: parenchyma in tail of pancreas  suggests chronic pancreatitis, abrupt changes in main pancreatic duct in tail becomes dilated up to 4-46mm, numerous associated dilated side branches, no solid mass. bx most c/w IPMN   EYE SURGERY     lasik bilateral   LAPAROSCOPY N/A 09/25/2013   Procedure: LAPAROSCOPY OPERATIVE;  Surgeon: Robley Fries, MD;  Location: [facility];  Service: Gynecology;  Laterality: N/A;   PANCREATECTOMY N/A 12/07/2013   Procedure: LAPAROSCOPIC DISTAL  PANCREATECTOMY;  Surgeon: Almond Lint, MD;  Location: [facility];  Service: General;  Laterality: N/A;   POLYPECTOMY  01/04/2017   Procedure: POLYPECTOMY;  Surgeon: West Bali, MD;  Location: [facility];  Service: Endoscopy;;  rectal   POLYPECTOMY  12/05/2019   Procedure: POLYPECTOMY;  Surgeon: West Bali, MD;  Location: [facility];  Service: Endoscopy;;   ROBOTIC ASSISTED TOTAL HYSTERECTOMY WITH BILATERAL SALPINGO OOPHERECTOMY Bilateral 09/07/2013   Procedure: ROBOTIC ASSISTED TOTAL HYSTERECTOMY WITH BILATERAL SALPINGO OOPHORECTOMY;  Surgeon: Robley Fries, MD;  Location: [facility];  Service: Gynecology;  Laterality: Bilateral;   WISDOM TOOTH EXTRACTION     Social History   Socioeconomic History   Marital status: Married    Spouse name: Not on file   Number of children: 1   Years of education: Not on file   Highest education level: Not on  file  Occupational History   Occupation: Chartered certified accountant: [employer]    Comment: Production designer, theatre/television/film, VP Materials engineer  Tobacco Use   Smoking status: Former    Packs/day: 1.00    Years: 10.00    Additional pack years: 0.00    Total pack years: 10.00    Types: Cigarettes    Quit date: 01/02/1987    Years since quitting: 35.9   Smokeless tobacco: Never  Vaping Use   Vaping Use: Never used  Substance and Sexual Activity   Alcohol use: Yes    Comment: 2-3 glases of wine on weekend nights   Drug use: No   Sexual activity: Not Currently    Birth control/protection: Post-menopausal, Surgical  Other Topics  Concern   Not on file  Social History Narrative   Not on file   Social Determinants of Health   Financial Resource Strain: Not on file  Food Insecurity: Not on file  Transportation Needs: Not on file  Physical Activity: Not on file  Stress: Not on file  Social Connections: Not on file   No Known Allergies Family History  Problem Relation Age of Onset   COPD Mother    Colon cancer Mother    Lung cancer Mother 77   Diabetes Brother    Diverticulitis Brother    Heart disease Maternal Grandfather    Stomach cancer Neg Hx    Esophageal cancer Neg Hx    Pancreatic cancer Neg Hx     Current Outpatient Medications (Endocrine & Metabolic):    estradiol (VIVELLE-DOT) 0.025 MG/24HR, Apply 1 patch to the skin twice a week.   Current Outpatient Medications (Respiratory):    fexofenadine (ALLEGRA) 180 MG tablet, Take 180 mg by mouth daily as needed for allergies or rhinitis.   fluticasone (FLONASE) 50 MCG/ACT nasal spray, 1 spray in each nostril Nasally Once a day or prn 30 day(s)   ipratropium (ATROVENT) 0.06 % nasal spray, INSTILL 1 TO 2 SPRAYS IN EACH NOSTRIL 3-4 TIMES A DAY AS NEEDED   levocetirizine (XYZAL) 5 MG tablet, Take 1 tablet (5 mg total) by mouth daily in the evening   montelukast (SINGULAIR) 10 MG tablet, Take 1 tablet (10 mg total) by mouth daily.  Current Outpatient Medications (Analgesics):    meloxicam (MOBIC) 7.5 MG tablet, Take 1 tablet (7.5 mg total) by mouth daily.   Current Outpatient Medications (Other):    amoxicillin (AMOXIL) 875 MG tablet, Take 1 tablet (875 mg total) by mouth 2 (two) times daily.   ascorbic acid (VITAMIN C) 100 MG tablet, Take 100 mg by mouth daily.  (Patient not taking: Reported on 02/09/2022)   b complex vitamins tablet, Take 1 tablet by mouth daily. (Patient not taking: Reported on 02/09/2022)   Calcium Carb-Cholecalciferol 600-800 MG-UNIT TABS, Take 1 tablet by mouth daily. (Patient not taking: Reported on 02/09/2022)   Diclofenac  Sodium 2 % SOLN, Place 2 g onto the skin 2 (two) times daily.   dimenhyDRINATE (DRAMAMINE) 50 MG tablet, Take 50 mg by mouth in the morning and at bedtime.   Lactobacillus (ACIDOPHILUS PO), Take 1 capsule by mouth daily. (Patient not taking: Reported on 02/09/2022)   MELATONIN PO, Take 1 tablet by mouth at bedtime.   Multiple Vitamin (MULTIVITAMIN) tablet, Take 1 tablet by mouth daily. (Patient not taking: Reported on 02/09/2022)   Omega-3 Fatty Acids (FISH OIL PO), Take 1 capsule by mouth daily. (Patient not taking: Reported on 02/09/2022)   RESTASIS 0.05 %  ophthalmic emulsion, Place 1 drop into both eyes 2 (two) times daily.   RSV vaccine recomb adjuvanted (AREXVY) 120 MCG/0.5ML injection, as directed Intramuscular once   tretinoin (RETIN-A) 0.05 % cream, Apply 1 application on the skin NIGHTLY   Wheat Dextrin (BENEFIBER PO), Take 1 packet by mouth daily.   Reviewed prior external information including notes and imaging from  primary care provider As well as notes that were available from care everywhere and other healthcare systems.  Past medical history, social, surgical and family history all reviewed in electronic medical record.  No pertanent information unless stated regarding to the chief complaint.   Review of Systems:  No headache, visual changes, nausea, vomiting, diarrhea, constipation, dizziness, abdominal pain, skin rash, fevers, chills, night sweats, weight loss, swollen lymph nodes, body aches, joint swelling, chest pain, shortness of breath, mood changes. POSITIVE muscle aches  Objective  Blood pressure 120/82, pulse 62, height  (1.651 m).   General: No apparent distress alert and oriented x3 mood and affect normal, dressed appropriately.  HEENT: Pupils equal, extraocular movements intact  Respiratory: Patient's speak in full sentences and does not appear short of breath  Cardiovascular: No lower extremity edema, non tender, no erythema  Right knee and right hip appear  to be fairly unremarkable at this time.  Mild tenderness over the medial joint line of the knee. Right fourth finger though does have swelling of the PIP joint noted.  Does have full range of motion but tender to palpation.  Limited muscular skeletal ultrasound was performed and interpreted by Antoine Primas, M  Limited ultrasound shows that patient does have hypoechoic changes of the PIP joint of the fourth finger and does have a cortical irregularity that is consistent with a healing fracture.  With hypoechoic changes it does appear that there was a potential tendon injury but no significant retraction noted at this time.  Increase in neovascularization in the area. Impression: Healing fracture    Impression and Recommendations:    The above documentation has been reviewed and is accurate and complete Judi Saa, DO

## 2022-11-20 ENCOUNTER — Other Ambulatory Visit (HOSPITAL_BASED_OUTPATIENT_CLINIC_OR_DEPARTMENT_OTHER): Payer: Self-pay

## 2022-11-23 ENCOUNTER — Ambulatory Visit: Payer: Medicare Other | Admitting: Family Medicine

## 2022-11-23 ENCOUNTER — Other Ambulatory Visit: Payer: Medicare Other

## 2022-11-23 VITALS — BP 120/82 | HR 62 | Ht 65.0 in

## 2022-11-23 DIAGNOSIS — M25552 Pain in left hip: Secondary | ICD-10-CM | POA: Diagnosis not present

## 2022-11-23 DIAGNOSIS — M25551 Pain in right hip: Secondary | ICD-10-CM

## 2022-11-23 DIAGNOSIS — S62644A Nondisplaced fracture of proximal phalanx of right ring finger, initial encounter for closed fracture: Secondary | ICD-10-CM

## 2022-11-23 NOTE — Patient Instructions (Signed)
Don't resize rings yet Ice after activity See you again in 5-6 weeks

## 2022-11-24 ENCOUNTER — Encounter: Payer: Self-pay | Admitting: Family Medicine

## 2022-11-24 DIAGNOSIS — S62644A Nondisplaced fracture of proximal phalanx of right ring finger, initial encounter for closed fracture: Secondary | ICD-10-CM | POA: Insufficient documentation

## 2022-11-24 NOTE — Assessment & Plan Note (Signed)
Nondisplaced fracture noted.  Do believe patient will do well.  We did discuss the potential for injection secondary to the surrounding hypoechoic changes.  Held on all other treatment options at this time but did discuss bracing aspect.  Topical anti-inflammatories and icing regimen follow-up again in 4 to 6 weeks

## 2022-12-21 ENCOUNTER — Encounter: Payer: Self-pay | Admitting: Family Medicine

## 2022-12-24 NOTE — Progress Notes (Deleted)
[name] [address] [place] [address] Phone: [phone] Subjective:    I'm seeing this patient by the request  of:  [name], [name], MD  CC:   [id number]  11/23/2022 Nondisplaced fracture noted.  Do believe patient will do well.  We did discuss the potential for injection secondary to the surrounding hypoechoic changes.  Held on all other treatment options at this time but did discuss bracing aspect.  Topical anti-inflammatories and icing regimen follow-up again in 4 to 6 weeks     Updated 12/29/2022 [name] is a 67 y.o. female coming in with complaint of finger, knee, and hip pain       Past Medical History:  Diagnosis Date   Basal cell carcinoma    skin cancer   Colon polyps    Diverticulosis    CT   Fibroids 09/07/2013   IBS (irritable bowel syndrome)    PONV (postoperative nausea and vomiting)    non recent    S/P hysterectomy with oophorectomy 09/07/2013   Seasonal allergies    SVD (spontaneous vaginal delivery)    x 1   Past Surgical History:  Procedure Laterality Date   ABDOMINAL HYSTERECTOMY     COLONOSCOPY     2 or 3   COLONOSCOPY N/A 01/04/2017   Dr. Darrick Penna: Significant looping of the rectosigmoid colon, 4 mm hyperplastic polyp removed from rectum, internal hemorrhoids.  Next colonoscopy in 5 to 10 years with MAC and ultra slim colonoscope.   COLONOSCOPY WITH PROPOFOL N/A 12/05/2019   Procedure: COLONOSCOPY WITH PROPOFOL;  Surgeon: West Bali, MD;  [facility]: [facility];  Service: Endoscopy;  Laterality: N/A;  7:30am   DILATION AND CURETTAGE OF UTERUS     x 3   EUS N/A 11/16/2013   Dr. Christella Hartigan: parenchyma in tail of pancreas suggests chronic pancreatitis, abrupt changes in main pancreatic duct in tail becomes dilated up to 4-62mm, numerous associated dilated side branches, no solid mass. bx most c/w IPMN   EYE SURGERY     lasik bilateral   LAPAROSCOPY N/A 09/25/2013   Procedure: LAPAROSCOPY  OPERATIVE;  Surgeon: Robley Fries, MD;  Location: [facility];  Service: Gynecology;  Laterality: N/A;   PANCREATECTOMY N/A 12/07/2013   Procedure: LAPAROSCOPIC DISTAL  PANCREATECTOMY;  Surgeon: Almond Lint, MD;  Location: [facility];  Service: General;  Laterality: N/A;   POLYPECTOMY  01/04/2017   Procedure: POLYPECTOMY;  Surgeon: West Bali, MD;  [facility];  Service: Endoscopy;;  rectal   POLYPECTOMY  12/05/2019   Procedure: POLYPECTOMY;  Surgeon: West Bali, MD;  [facility];  Service: Endoscopy;;   ROBOTIC ASSISTED TOTAL HYSTERECTOMY WITH BILATERAL SALPINGO OOPHERECTOMY Bilateral 09/07/2013   Procedure: ROBOTIC ASSISTED TOTAL HYSTERECTOMY WITH BILATERAL SALPINGO OOPHORECTOMY;  Surgeon: Robley Fries, MD;  Location: [facility];  Service: Gynecology;  Laterality: Bilateral;   WISDOM TOOTH EXTRACTION     Social History   Socioeconomic History   Marital status: Married    Spouse name: Not on file   Number of children: 1   Years of education: Not on file   Highest education level: Not on file  Occupational History   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[employer]    Comment: Kathleene Hazel, VP Materials engineer  Tobacco Use   Smoking status: Former    Packs/day: 1.00    Years: 10.00    Additional pack years: 0.00    Total pack  years: 10.00    Types: Cigarettes    Quit date: 01/02/1987    Years since quitting: 36.0   Smokeless tobacco: Never  Vaping Use   Vaping Use: Never used  Substance and Sexual Activity   Alcohol use: Yes    Comment: 2-3 glases of wine on weekend nights   Drug use: No   Sexual activity: Not Currently    Birth control/protection: Post-menopausal, Surgical  Other Topics Concern   Not on file  Social History Narrative   Not on file   Social Determinants of Health   Financial Resource Strain: Not on file  Food Insecurity: Not on file  Transportation Needs: Not on file  Physical Activity: Not on file  Stress: Not on file  Social  Connections: Not on file   No Known Allergies Family History  Problem Relation Age of Onset   COPD Mother    Colon cancer Mother    Lung cancer Mother 66   Diabetes Brother    Diverticulitis Brother    Heart disease Maternal Grandfather    Stomach cancer Neg Hx    Esophageal cancer Neg Hx    Pancreatic cancer Neg Hx     Current Outpatient Medications (Endocrine & Metabolic):    estradiol (VIVELLE-DOT) 0.025 MG/24HR, Apply 1 patch to the skin twice a week.   Current Outpatient Medications (Respiratory):    fexofenadine (ALLEGRA) 180 MG tablet, Take 180 mg by mouth daily as needed for allergies or rhinitis.   fluticasone (FLONASE) 50 MCG/ACT nasal spray, 1 spray in each nostril Nasally Once a day or prn 30 day(s)   ipratropium (ATROVENT) 0.06 % nasal spray, INSTILL 1 TO 2 SPRAYS IN EACH NOSTRIL 3-4 TIMES A DAY AS NEEDED   levocetirizine (XYZAL) 5 MG tablet, Take 1 tablet (5 mg total) by mouth daily in the evening   montelukast (SINGULAIR) 10 MG tablet, Take 1 tablet (10 mg total) by mouth daily.  Current Outpatient Medications (Analgesics):    meloxicam (MOBIC) 7.5 MG tablet, Take 1 tablet (7.5 mg total) by mouth daily.   Current Outpatient Medications (Other):    amoxicillin (AMOXIL) 875 MG tablet, Take 1 tablet (875 mg total) by mouth 2 (two) times daily.   ascorbic acid (VITAMIN C) 100 MG tablet, Take 100 mg by mouth daily.  (Patient not taking: Reported on 02/09/2022)   b complex vitamins tablet, Take 1 tablet by mouth daily. (Patient not taking: Reported on 02/09/2022)   Calcium Carb-Cholecalciferol 600-800 MG-UNIT TABS, Take 1 tablet by mouth daily. (Patient not taking: Reported on 02/09/2022)   Diclofenac Sodium 2 % SOLN, Place 2 g onto the skin 2 (two) times daily.   dimenhyDRINATE (DRAMAMINE) 50 MG tablet, Take 50 mg by mouth in the morning and at bedtime.   Lactobacillus (ACIDOPHILUS PO), Take 1 capsule by mouth daily. (Patient not taking: Reported on 02/09/2022)    MELATONIN PO, Take 1 tablet by mouth at bedtime.   Multiple Vitamin (MULTIVITAMIN) tablet, Take 1 tablet by mouth daily. (Patient not taking: Reported on 02/09/2022)   Omega-3 Fatty Acids (FISH OIL PO), Take 1 capsule by mouth daily. (Patient not taking: Reported on 02/09/2022)   RESTASIS 0.05 % ophthalmic emulsion, Place 1 drop into both eyes 2 (two) times daily.   RSV vaccine recomb adjuvanted (AREXVY) 120 MCG/0.5ML injection, as directed Intramuscular once   tretinoin (RETIN-A) 0.05 % cream, Apply 1 application on the skin NIGHTLY   Wheat Dextrin (BENEFIBER PO), Take 1 packet by mouth daily.  Reviewed prior external information including notes and imaging from  primary care provider As well as notes that were available from care everywhere and other healthcare systems.  Past medical history, social, surgical and family history all reviewed in electronic medical record.  No pertanent information unless stated regarding to the chief complaint.   Review of Systems:  No headache, visual changes, nausea, vomiting, diarrhea, constipation, dizziness, abdominal pain, skin rash, fevers, chills, night sweats, weight loss, swollen lymph nodes, body aches, joint swelling, chest pain, shortness of breath, mood changes. POSITIVE muscle aches  Objective  There were no vitals taken for this visit.   General: No apparent distress alert and oriented x3 mood and affect normal, dressed appropriately.  HEENT: Pupils equal, extraocular movements intact  Respiratory: Patient's speak in full sentences and does not appear short of breath  Cardiovascular: No lower extremity edema, non tender, no erythema      Impression and Recommendations:

## 2022-12-29 ENCOUNTER — Ambulatory Visit: Payer: Commercial Managed Care - PPO | Admitting: Family Medicine

## 2023-01-04 ENCOUNTER — Ambulatory Visit
Admission: RE | Admit: 2023-01-04 | Discharge: 2023-01-04 | Disposition: A | Discharge status: Home / Self Care | Payer: Self-pay | Source: Ambulatory Visit | Attending: Obstetrics & Gynecology | Admitting: Obstetrics & Gynecology

## 2023-01-04 ENCOUNTER — Other Ambulatory Visit: Payer: Self-pay | Admitting: Internal Medicine

## 2023-01-04 DIAGNOSIS — Z0000 Encounter for general adult medical examination without abnormal findings: Secondary | ICD-10-CM

## 2023-01-04 DIAGNOSIS — E2839 Other primary ovarian failure: Secondary | ICD-10-CM

## 2023-01-21 NOTE — Progress Notes (Signed)
[name] [address] [place] [address] Phone: [phone] Subjective:   Bruce Donath, am serving as a scribe for Dr. Antoine Primas.  I'm seeing this patient by the request  of:  [name], [name], MD  CC: Back pain, right finger pain.  [id number]  11/23/2022 Nondisplaced fracture noted. Do believe patient will do well. We did discuss the potential for injection secondary to the surrounding hypoechoic changes. Held on all other treatment options at this time but did discuss bracing aspect. Topical anti-inflammatories and icing regimen follow-up again in 4 to 6 weeks   Updated 01/27/2023 [name] is a 67 y.o. female coming in with complaint of r finger pain. Patient states that her grip is not as strong due to ring finger swelling.   Fell yesterday on R piriformis from seated position on her bike.        Past Medical History:  Diagnosis Date   Basal cell carcinoma    skin cancer   Colon polyps    Diverticulosis    CT   Fibroids 09/07/2013   IBS (irritable bowel syndrome)    PONV (postoperative nausea and vomiting)    non recent    S/P hysterectomy with oophorectomy 09/07/2013   Seasonal allergies    SVD (spontaneous vaginal delivery)    x 1   Past Surgical History:  Procedure Laterality Date   ABDOMINAL HYSTERECTOMY     COLONOSCOPY     2 or 3   COLONOSCOPY N/A 01/04/2017   Dr. Darrick Penna: Significant looping of the rectosigmoid colon, 4 mm hyperplastic polyp removed from rectum, internal hemorrhoids.  Next colonoscopy in 5 to 10 years with MAC and ultra slim colonoscope.   COLONOSCOPY WITH PROPOFOL N/A 12/05/2019   Procedure: COLONOSCOPY WITH PROPOFOL;  Surgeon: West Bali, MD;  [facility]: [facility];  Service: Endoscopy;  Laterality: N/A;  7:30am   DILATION AND CURETTAGE OF UTERUS     x 3   EUS N/A 11/16/2013   Dr. Christella Hartigan: parenchyma in tail of pancreas suggests chronic pancreatitis, abrupt changes in main  pancreatic duct in tail becomes dilated up to 4-49mm, numerous associated dilated side branches, no solid mass. bx most c/w IPMN   EYE SURGERY     lasik bilateral   LAPAROSCOPY N/A 09/25/2013   Procedure: LAPAROSCOPY OPERATIVE;  Surgeon: Robley Fries, MD;  Location: [facility];  Service: Gynecology;  Laterality: N/A;   PANCREATECTOMY N/A 12/07/2013   Procedure: LAPAROSCOPIC DISTAL  PANCREATECTOMY;  Surgeon: Almond Lint, MD;  Location: [facility];  Service: General;  Laterality: N/A;   POLYPECTOMY  01/04/2017   Procedure: POLYPECTOMY;  Surgeon: West Bali, MD;  [facility];  Service: Endoscopy;;  rectal   POLYPECTOMY  12/05/2019   Procedure: POLYPECTOMY;  Surgeon: West Bali, MD;  [facility];  Service: Endoscopy;;   ROBOTIC ASSISTED TOTAL HYSTERECTOMY WITH BILATERAL SALPINGO OOPHERECTOMY Bilateral 09/07/2013   Procedure: ROBOTIC ASSISTED TOTAL HYSTERECTOMY WITH BILATERAL SALPINGO OOPHORECTOMY;  Surgeon: Robley Fries, MD;  Location: [facility];  Service: Gynecology;  Laterality: Bilateral;   WISDOM TOOTH EXTRACTION     Social History   Socioeconomic History   Marital status: Married    Spouse name: Not on file   Number of children: 1   Years of education: Not on file   Highest education level: Not on file  Occupational History   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[employer]    Comment:  Kathleene Hazel, VP Senior Program Officer  Tobacco Use   Smoking status: Former    Packs/day: 1.00    Years: 10.00    Additional pack years: 0.00    Total pack years: 10.00    Types: Cigarettes    Quit date: 01/02/1987    Years since quitting: 36.0   Smokeless tobacco: Never  Vaping Use   Vaping Use: Never used  Substance and Sexual Activity   Alcohol use: Yes    Comment: 2-3 glases of wine on weekend nights   Drug use: No   Sexual activity: Not Currently    Birth control/protection: Post-menopausal, Surgical  Other Topics Concern   Not on file  Social History Narrative    Not on file   Social Determinants of Health   Financial Resource Strain: Not on file  Food Insecurity: Not on file  Transportation Needs: Not on file  Physical Activity: Not on file  Stress: Not on file  Social Connections: Not on file   No Known Allergies Family History  Problem Relation Age of Onset   COPD Mother    Colon cancer Mother    Lung cancer Mother 76   Diabetes Brother    Diverticulitis Brother    Heart disease Maternal Grandfather    Stomach cancer Neg Hx    Esophageal cancer Neg Hx    Pancreatic cancer Neg Hx     Current Outpatient Medications (Endocrine & Metabolic):    estradiol (VIVELLE-DOT) 0.025 MG/24HR, Apply 1 patch to the skin twice a week.   Current Outpatient Medications (Respiratory):    fexofenadine (ALLEGRA) 180 MG tablet, Take 180 mg by mouth daily as needed for allergies or rhinitis.   fluticasone (FLONASE) 50 MCG/ACT nasal spray, 1 spray in each nostril Nasally Once a day or prn 30 day(s)   ipratropium (ATROVENT) 0.06 % nasal spray, INSTILL 1 TO 2 SPRAYS IN EACH NOSTRIL 3-4 TIMES A DAY AS NEEDED   levocetirizine (XYZAL) 5 MG tablet, Take 1 tablet (5 mg total) by mouth daily in the evening   montelukast (SINGULAIR) 10 MG tablet, Take 1 tablet (10 mg total) by mouth daily.  Current Outpatient Medications (Analgesics):    meloxicam (MOBIC) 7.5 MG tablet, Take 1 tablet (7.5 mg total) by mouth daily.   Current Outpatient Medications (Other):    amoxicillin (AMOXIL) 875 MG tablet, Take 1 tablet (875 mg total) by mouth 2 (two) times daily.   ascorbic acid (VITAMIN C) 100 MG tablet, Take 100 mg by mouth daily.   b complex vitamins tablet, Take 1 tablet by mouth daily.   Calcium Carb-Cholecalciferol 600-800 MG-UNIT TABS, Take 1 tablet by mouth daily.   Diclofenac Sodium 2 % SOLN, Place 2 g onto the skin 2 (two) times daily.   dimenhyDRINATE (DRAMAMINE) 50 MG tablet, Take 50 mg by mouth in the morning and at bedtime.   Lactobacillus (ACIDOPHILUS PO),  Take 1 capsule by mouth daily.   MELATONIN PO, Take 1 tablet by mouth at bedtime.   Multiple Vitamin (MULTIVITAMIN) tablet, Take 1 tablet by mouth daily.   Omega-3 Fatty Acids (FISH OIL PO), Take 1 capsule by mouth daily.   RESTASIS 0.05 % ophthalmic emulsion, Place 1 drop into both eyes 2 (two) times daily.   RSV vaccine recomb adjuvanted (AREXVY) 120 MCG/0.5ML injection, as directed Intramuscular once   tretinoin (RETIN-A) 0.05 % cream, Apply 1 application on the skin NIGHTLY   Wheat Dextrin (BENEFIBER PO), Take 1 packet by mouth daily.   Reviewed  prior external information including notes and imaging from  primary care provider As well as notes that were available from care everywhere and other healthcare systems.  Past medical history, social, surgical and family history all reviewed in electronic medical record.  No pertanent information unless stated regarding to the chief complaint.   Review of Systems:  No headache, visual changes, nausea, vomiting, diarrhea, constipation, dizziness, abdominal pain, skin rash, fevers, chills, night sweats, weight loss, swollen lymph nodes, body aches, joint swelling, chest pain, shortness of breath, mood changes. POSITIVE muscle aches  Objective  Blood pressure 110/68, pulse 65, height 5\' 5"  (1.651 m), SpO2 98 %.   General: No apparent distress alert and oriented x3 mood and affect normal, dressed appropriately.  HEENT: Pupils equal, extraocular movements intact  Respiratory: Patient's speak in full sentences and does not appear short of breath  Cardiovascular: No lower extremity edema, non tender, no erythema   Right ring finger does have swelling noted.  Patient does have some tenderness to palpation over the PIP joint.  Does have some difficulty with flexion.  Back does have some loss of lordosis noted.  Some tenderness to palpation diffusely.  Tightness with Pearlean Brownie right greater than left.  Osteopathic findings C4 flexed rotated and side  bent left C6 flexed rotated and side bent left T3 extended rotated and side bent right inhaled third rib L2 flexed rotated and side bent right L4 flexed rotated and side bent right Sacrum right on right  Procedure: Real-time Ultrasound Guided Injection of right fourth PIP Device: GE Logiq Q7 Ultrasound guided injection is preferred based studies that show increased duration, increased effect, greater accuracy, decreased procedural pain, increased response rate, and decreased cost with ultrasound guided versus blind injection.  Verbal informed consent obtained.  Time-out conducted.  Noted no overlying erythema, induration, or other signs of local infection.  Skin prepped in a sterile fashion.  Local anesthesia: Topical Ethyl chloride.  With sterile technique and under real time ultrasound guidance: With a 25-gauge half inch needle injected with 0.5 cc of 0.5% Marcaine and 0.5 cc of Kenalog 40 mg/mL Completed without difficulty  Advised to call if fevers/chills, erythema, induration, drainage, or persistent bleeding.  Impression: Technically successful ultrasound guided injection.   Impression and Recommendations:     The above documentation has been reviewed and is accurate and complete Judi Saa, DO

## 2023-01-25 ENCOUNTER — Other Ambulatory Visit: Payer: Self-pay

## 2023-01-25 ENCOUNTER — Other Ambulatory Visit (HOSPITAL_BASED_OUTPATIENT_CLINIC_OR_DEPARTMENT_OTHER): Payer: Self-pay

## 2023-01-27 ENCOUNTER — Ambulatory Visit: Payer: Medicare Other | Admitting: Family Medicine

## 2023-01-27 ENCOUNTER — Ambulatory Visit (INDEPENDENT_AMBULATORY_CARE_PROVIDER_SITE_OTHER): Payer: Medicare Other

## 2023-01-27 ENCOUNTER — Other Ambulatory Visit: Payer: Self-pay

## 2023-01-27 ENCOUNTER — Encounter: Payer: Self-pay | Admitting: Family Medicine

## 2023-01-27 VITALS — BP 110/68 | HR 65 | Ht 65.0 in

## 2023-01-27 DIAGNOSIS — M19041 Primary osteoarthritis, right hand: Secondary | ICD-10-CM

## 2023-01-27 DIAGNOSIS — M9903 Segmental and somatic dysfunction of lumbar region: Secondary | ICD-10-CM | POA: Diagnosis not present

## 2023-01-27 DIAGNOSIS — M9901 Segmental and somatic dysfunction of cervical region: Secondary | ICD-10-CM | POA: Diagnosis not present

## 2023-01-27 DIAGNOSIS — M545 Low back pain, unspecified: Secondary | ICD-10-CM

## 2023-01-27 DIAGNOSIS — M79644 Pain in right finger(s): Secondary | ICD-10-CM

## 2023-01-27 DIAGNOSIS — G8929 Other chronic pain: Secondary | ICD-10-CM

## 2023-01-27 DIAGNOSIS — M9902 Segmental and somatic dysfunction of thoracic region: Secondary | ICD-10-CM | POA: Diagnosis not present

## 2023-01-27 DIAGNOSIS — M9904 Segmental and somatic dysfunction of sacral region: Secondary | ICD-10-CM

## 2023-01-27 DIAGNOSIS — M9908 Segmental and somatic dysfunction of rib cage: Secondary | ICD-10-CM

## 2023-01-27 NOTE — Assessment & Plan Note (Signed)
Patient given injection and tolerated the procedure well.  We discussed with patient that this may take some time.  Concerned that there is a possible loose body in the joint that is noted on ultrasound.  Discussed home exercises, icing regimen, which activities to do and which ones to avoid.  Increase activity slowly.  Follow-up again in 6 to 8 weeks discussed buddy taping

## 2023-01-27 NOTE — Patient Instructions (Signed)
Xray hand I think you just hurt your butt See me in 6-8 weeks

## 2023-01-27 NOTE — Assessment & Plan Note (Signed)
Chronic but stable.  Discussed which activities to do, posture and ergonomics, discussed home exercises.  Patient did have a recent fall that I think did cause an exacerbation in the buttocks area.  Will continue to monitor.  Follow-up again in 6 to 8 weeks

## 2023-01-29 ENCOUNTER — Other Ambulatory Visit (HOSPITAL_BASED_OUTPATIENT_CLINIC_OR_DEPARTMENT_OTHER): Payer: Self-pay

## 2023-01-29 MED ORDER — ESTRADIOL 0.025 MG/24HR TD PTTW
MEDICATED_PATCH | TRANSDERMAL | 0 refills | Status: DC
  Filled 2023-01-29: qty 24, 84d supply, fill #0

## 2023-02-05 ENCOUNTER — Other Ambulatory Visit (HOSPITAL_BASED_OUTPATIENT_CLINIC_OR_DEPARTMENT_OTHER): Payer: Self-pay

## 2023-02-05 MED ORDER — AMOXICILLIN-POT CLAVULANATE 875-125 MG PO TABS
875.0000 mg | ORAL_TABLET | Freq: Two times a day (BID) | ORAL | 1 refills | Status: DC
  Filled 2023-02-05: qty 28, 14d supply, fill #0

## 2023-02-09 ENCOUNTER — Telehealth: Payer: Self-pay | Admitting: Gastroenterology

## 2023-02-09 ENCOUNTER — Other Ambulatory Visit: Payer: Self-pay

## 2023-02-09 DIAGNOSIS — Z90411 Acquired partial absence of pancreas: Secondary | ICD-10-CM

## 2023-02-09 DIAGNOSIS — D490 Neoplasm of unspecified behavior of digestive system: Secondary | ICD-10-CM

## 2023-02-09 NOTE — Telephone Encounter (Signed)
Called the patient to discuss. No answer. Left her the following information in her voicemail; Dr Lavon Paganini will order the MRI of the abdomen to be done after 03/06/23 which will be one year. Order is in [facility] and Radiology Scheduling has been notified.

## 2023-02-09 NOTE — Telephone Encounter (Signed)
[name] is calling to find out if Dr. Lavon Paganini will be putting in an order for her repeating MRI or does she need to be seen in office. Please advise.

## 2023-02-09 NOTE — Addendum Note (Signed)
Addended by: Heber Amasa A on: 02/09/2023 03:37 PM   Modules accepted: Orders

## 2023-02-09 NOTE — Telephone Encounter (Signed)
She will need MRI abdomen pancreas protocol. Thanks

## 2023-02-09 NOTE — Addendum Note (Signed)
Addended by: Heber Bristol A on: 02/09/2023 03:36 PM   Modules accepted: Orders

## 2023-02-09 NOTE — Telephone Encounter (Signed)
Patient had an MRI/MRCP 03/05/22. Radiologist recommendations are :  IMPRESSION: 1. 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 New minimal dilatation (3 mm diameter) of the main pancreatic duct in the pancreatic body near the pancreatectomy margin. Suggest continued MRI abdomen surveillance in 12 months.  I will put in the order. To confirm, this will be an MRI Abdomen, not an MRI/MRCP?

## 2023-02-11 ENCOUNTER — Other Ambulatory Visit (HOSPITAL_BASED_OUTPATIENT_CLINIC_OR_DEPARTMENT_OTHER): Payer: Self-pay

## 2023-03-04 NOTE — Progress Notes (Signed)
[name] [address] [place] [address] Phone: [phone] Subjective:   [name], am serving as a scribe for Dr. Antoine Primas.  I'm seeing this patient by the request  of:  [name], [name], MD  CC: Low back pain follow-up  QIO:[id number]  01/27/2023 Patient given injection and tolerated the procedure well.  We discussed with patient that this may take some time.  Concerned that there is a possible loose body in the joint that is noted on ultrasound.  Discussed home exercises, icing regimen, which activities to do and which ones to avoid.  Increase activity slowly.  Follow-up again in 6 to 8 weeks discussed buddy taping     Chronic but stable.  Discussed which activities to do, posture and ergonomics, discussed home exercises.  Patient did have a recent fall that I think did cause an exacerbation in the buttocks area.  Will continue to monitor.  Follow-up again in 6 to 8 weeks     Updated 03/10/2023 [name] is a 67 y.o. female coming in with complaint of finger and back pain. Finger feels better than last time. Occasionally will get stuck. Back is doing really well. R 4th finger.       Past Medical History:  Diagnosis Date   Basal cell carcinoma    skin cancer   Colon polyps    Diverticulosis    CT   Fibroids 09/07/2013   IBS (irritable bowel syndrome)    PONV (postoperative nausea and vomiting)    non recent    S/P hysterectomy with oophorectomy 09/07/2013   Seasonal allergies    SVD (spontaneous vaginal delivery)    x 1   Past Surgical History:  Procedure Laterality Date   ABDOMINAL HYSTERECTOMY     COLONOSCOPY     2 or 3   COLONOSCOPY N/A 01/04/2017   Dr. Darrick Penna: Significant looping of the rectosigmoid colon, 4 mm hyperplastic polyp removed from rectum, internal hemorrhoids.  Next colonoscopy in 5 to 10 years with MAC and ultra slim colonoscope.   COLONOSCOPY WITH PROPOFOL N/A 12/05/2019   Procedure: COLONOSCOPY  WITH PROPOFOL;  Surgeon: West Bali, MD;  Location: [facility];  Service: Endoscopy;  Laterality: N/A;  7:30am   DILATION AND CURETTAGE OF UTERUS     x 3   EUS N/A 11/16/2013   Dr. Christella Hartigan: parenchyma in tail of pancreas suggests chronic pancreatitis, abrupt changes in main pancreatic duct in tail becomes dilated up to 4-42mm, numerous associated dilated side branches, no solid mass. bx most c/w IPMN   EYE SURGERY     lasik bilateral   LAPAROSCOPY N/A 09/25/2013   Procedure: LAPAROSCOPY OPERATIVE;  Surgeon: Robley Fries, MD;  Location: [facility];  Service: Gynecology;  Laterality: N/A;   PANCREATECTOMY N/A 12/07/2013   Procedure: LAPAROSCOPIC DISTAL  PANCREATECTOMY;  Surgeon: Almond Lint, MD;  Location: [facility];  Service: General;  Laterality: N/A;   POLYPECTOMY  01/04/2017   Procedure: POLYPECTOMY;  Surgeon: West Bali, MD;  Location: [facility];  Service: Endoscopy;;  rectal   POLYPECTOMY  12/05/2019   Procedure: POLYPECTOMY;  Surgeon: West Bali, MD;  Location: [facility];  Service: Endoscopy;;   ROBOTIC ASSISTED TOTAL HYSTERECTOMY WITH BILATERAL SALPINGO OOPHERECTOMY Bilateral 09/07/2013   Procedure: ROBOTIC ASSISTED TOTAL HYSTERECTOMY WITH BILATERAL SALPINGO OOPHORECTOMY;  Surgeon: Robley Fries, MD;  Location: [facility];  Service: Gynecology;  Laterality: Bilateral;   WISDOM TOOTH EXTRACTION  Social History   Socioeconomic History   Marital status: Married    Spouse name: Not on file   Number of children: 1   Years of education: Not on file   Highest education level: Not on file  Occupational History   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[employer]    Comment: Production designer, theatre/television/film, VP Materials engineer  Tobacco Use   Smoking status: Former    Current packs/day: 0.00    Average packs/day: 1 pack/day for 10.0 years (10.0 ttl pk-yrs)    Types: Cigarettes    Start date: 01/01/1977    Quit date: 01/02/1987    Years since quitting: 36.2   Smokeless tobacco: Never  Vaping  Use   Vaping status: Never Used  Substance and Sexual Activity   Alcohol use: Yes    Comment: 2-3 glases of wine on weekend nights   Drug use: No   Sexual activity: Not Currently    Birth control/protection: Post-menopausal, Surgical  Other Topics Concern   Not on file  Social History Narrative   Not on file   Social Determinants of Health   Financial Resource Strain: Not on file  Food Insecurity: Not on file  Transportation Needs: Not on file  Physical Activity: Not on file  Stress: Not on file  Social Connections: Not on file   No Known Allergies Family History  Problem Relation Age of Onset   COPD Mother    Colon cancer Mother    Lung cancer Mother 30   Diabetes Brother    Diverticulitis Brother    Heart disease Maternal Grandfather    Stomach cancer Neg Hx    Esophageal cancer Neg Hx    Pancreatic cancer Neg Hx     Current Outpatient Medications (Endocrine & Metabolic):    estradiol (VIVELLE-DOT) 0.025 MG/24HR, Apply 1 patch to the skin twice a week.   estradiol (VIVELLE-DOT) 0.025 MG/24HR, Apply 1 patch(es) twice a week.   Current Outpatient Medications (Respiratory):    fexofenadine (ALLEGRA) 180 MG tablet, Take 180 mg by mouth daily as needed for allergies or rhinitis.   fluticasone (FLONASE) 50 MCG/ACT nasal spray, 1 spray in each nostril Nasally Once a day or prn 30 day(s)   ipratropium (ATROVENT) 0.06 % nasal spray, INSTILL 1 TO 2 SPRAYS IN EACH NOSTRIL 3-4 TIMES A DAY AS NEEDED   levocetirizine (XYZAL) 5 MG tablet, Take 1 tablet (5 mg total) by mouth daily in the evening   montelukast (SINGULAIR) 10 MG tablet, Take 1 tablet (10 mg total) by mouth daily.  Current Outpatient Medications (Analgesics):    meloxicam (MOBIC) 7.5 MG tablet, Take 1 tablet (7.5 mg total) by mouth daily.   Current Outpatient Medications (Other):    amoxicillin (AMOXIL) 875 MG tablet, Take 1 tablet (875 mg total) by mouth 2 (two) times daily.   amoxicillin-clavulanate (AUGMENTIN)  875-125 MG tablet, Take 1 tablet by mouth in the morning and at bedtime.   ascorbic acid (VITAMIN C) 100 MG tablet, Take 100 mg by mouth daily.   b complex vitamins tablet, Take 1 tablet by mouth daily.   Calcium Carb-Cholecalciferol 600-800 MG-UNIT TABS, Take 1 tablet by mouth daily.   Diclofenac Sodium 2 % SOLN, Place 2 g onto the skin 2 (two) times daily.   dimenhyDRINATE (DRAMAMINE) 50 MG tablet, Take 50 mg by mouth in the morning and at bedtime.   Lactobacillus (ACIDOPHILUS PO), Take 1 capsule by mouth daily.   MELATONIN PO, Take 1 tablet by  mouth at bedtime.   Multiple Vitamin (MULTIVITAMIN) tablet, Take 1 tablet by mouth daily.   Omega-3 Fatty Acids (FISH OIL PO), Take 1 capsule by mouth daily.   RESTASIS 0.05 % ophthalmic emulsion, Place 1 drop into both eyes 2 (two) times daily.   RSV vaccine recomb adjuvanted (AREXVY) 120 MCG/0.5ML injection, as directed Intramuscular once   tretinoin (RETIN-A) 0.05 % cream, Apply 1 application on the skin NIGHTLY   Wheat Dextrin (BENEFIBER PO), Take 1 packet by mouth daily.   Reviewed prior external information including notes and imaging from  primary care provider As well as notes that were available from care everywhere and other healthcare systems.  Past medical history, social, surgical and family history all reviewed in electronic medical record.  No pertanent information unless stated regarding to the chief complaint.   Review of Systems:  No headache, visual changes, nausea, vomiting, diarrhea, constipation, dizziness, abdominal pain, skin rash, fevers, chills, night sweats, weight loss, swollen lymph nodes, body aches, joint swelling, chest pain, shortness of breath, mood changes. POSITIVE muscle aches  Objective  Blood pressure 110/68, height 5\' 5"  (1.651 m).   General: No apparent distress alert and oriented x3 mood and affect normal, dressed appropriately.  HEENT: Pupils equal, extraocular movements intact  Respiratory: Patient's  speak in full sentences and does not appear short of breath  Cardiovascular: No lower extremity edema, non tender, no erythema  Low back does have tightness noted on the right side of the back.  Tender to palpation diffusely on the right side of the back.  Positive Faber on the right side.  Neck exam does have some limited sidebending bilaterally.   Osteopathic findings  T5 extended rotated and side bent right inhaled third rib T9 extended rotated and side bent left L3 flexed rotated and side bent right Sacrum right on right    Impression and Recommendations:    The above documentation has been reviewed and is accurate and complete Sabrina Saa, DO

## 2023-03-10 ENCOUNTER — Ambulatory Visit (INDEPENDENT_AMBULATORY_CARE_PROVIDER_SITE_OTHER): Payer: Medicare Other | Admitting: Family Medicine

## 2023-03-10 ENCOUNTER — Other Ambulatory Visit: Payer: Self-pay

## 2023-03-10 VITALS — BP 110/68 | Ht 65.0 in

## 2023-03-10 DIAGNOSIS — M9904 Segmental and somatic dysfunction of sacral region: Secondary | ICD-10-CM | POA: Diagnosis not present

## 2023-03-10 DIAGNOSIS — M545 Low back pain, unspecified: Secondary | ICD-10-CM

## 2023-03-10 DIAGNOSIS — M9908 Segmental and somatic dysfunction of rib cage: Secondary | ICD-10-CM

## 2023-03-10 DIAGNOSIS — M79644 Pain in right finger(s): Secondary | ICD-10-CM | POA: Diagnosis not present

## 2023-03-10 DIAGNOSIS — M9902 Segmental and somatic dysfunction of thoracic region: Secondary | ICD-10-CM

## 2023-03-10 DIAGNOSIS — M9903 Segmental and somatic dysfunction of lumbar region: Secondary | ICD-10-CM | POA: Diagnosis not present

## 2023-03-10 DIAGNOSIS — G8929 Other chronic pain: Secondary | ICD-10-CM

## 2023-03-10 NOTE — Patient Instructions (Signed)
MRI is going to be just fine Bring the handle bars up on your bike a little more See you again in 2 months

## 2023-03-10 NOTE — Assessment & Plan Note (Signed)
Chronic low back pain noted.  Patient is an MRI next week with patient having a history of the pancreas mass.  She did seem nervous and some increase in anxiety noted.  Discussed icing regimen and home exercises.  We discussed which activities to do and which ones to avoid.  I do not think any other significant changes in medication are necessary at the moment.  Follow-up again in 6 to 8 weeks otherwise.

## 2023-03-15 ENCOUNTER — Other Ambulatory Visit: Payer: Self-pay | Admitting: Gastroenterology

## 2023-03-15 ENCOUNTER — Ambulatory Visit (HOSPITAL_COMMUNITY)
Admission: RE | Admit: 2023-03-15 | Discharge: 2023-03-15 | Disposition: A | Discharge status: Home / Self Care | Payer: Medicare Other | Source: Ambulatory Visit | Attending: Gastroenterology | Admitting: Gastroenterology

## 2023-03-15 DIAGNOSIS — D490 Neoplasm of unspecified behavior of digestive system: Secondary | ICD-10-CM | POA: Diagnosis present

## 2023-03-15 DIAGNOSIS — Z90411 Acquired partial absence of pancreas: Secondary | ICD-10-CM | POA: Insufficient documentation

## 2023-03-15 MED ORDER — GADOBUTROL 1 MMOL/ML IV SOLN
7.0000 mL | Freq: Once | INTRAVENOUS | Status: AC | PRN
  Administered 2023-03-15: 7 mL via INTRAVENOUS

## 2023-03-19 ENCOUNTER — Other Ambulatory Visit: Payer: Self-pay

## 2023-03-19 DIAGNOSIS — R195 Other fecal abnormalities: Secondary | ICD-10-CM

## 2023-03-25 ENCOUNTER — Telehealth: Payer: Self-pay | Admitting: Gastroenterology

## 2023-03-25 NOTE — Telephone Encounter (Signed)
Spoke with the patient. Advised her question has been forwarded to the doctor. She asks for a response through My Chart.

## 2023-03-25 NOTE — Telephone Encounter (Signed)
Patient called states she reviewed the MRI results and would like to know if she needs to follow up within a year or so. Please advise.

## 2023-03-26 NOTE — Telephone Encounter (Signed)
What follow up is recommended for this patient?

## 2023-03-26 NOTE — Telephone Encounter (Signed)
Based on unremarkable MRI study and no cystic lesions in the pancreas, no further follow-up is recommended at this point.  Please schedule next available office visit with me for routine annual follow-up.

## 2023-03-26 NOTE — Telephone Encounter (Signed)
Message sent to the patient. 

## 2023-03-29 ENCOUNTER — Ambulatory Visit
Admission: RE | Admit: 2023-03-29 | Discharge: 2023-03-29 | Disposition: A | Discharge status: Home / Self Care | Payer: Medicare Other | Source: Ambulatory Visit | Attending: Internal Medicine | Admitting: Internal Medicine

## 2023-03-29 DIAGNOSIS — Z0000 Encounter for general adult medical examination without abnormal findings: Secondary | ICD-10-CM

## 2023-03-31 ENCOUNTER — Other Ambulatory Visit (HOSPITAL_BASED_OUTPATIENT_CLINIC_OR_DEPARTMENT_OTHER): Payer: Self-pay

## 2023-03-31 MED ORDER — ESTRADIOL 0.025 MG/24HR TD PTTW
1.0000 | MEDICATED_PATCH | TRANSDERMAL | 3 refills | Status: DC
Start: 2023-04-01 — End: 2024-06-15
  Filled 2023-03-31 – 2023-04-27 (×2): qty 24, 84d supply, fill #0
  Filled 2023-08-25: qty 24, 84d supply, fill #1
  Filled 2023-12-08: qty 24, 84d supply, fill #2
  Filled 2024-03-22: qty 24, 84d supply, fill #3

## 2023-04-13 ENCOUNTER — Other Ambulatory Visit (HOSPITAL_BASED_OUTPATIENT_CLINIC_OR_DEPARTMENT_OTHER): Payer: Self-pay

## 2023-04-13 ENCOUNTER — Ambulatory Visit (INDEPENDENT_AMBULATORY_CARE_PROVIDER_SITE_OTHER): Payer: Medicare Other | Admitting: Family Medicine

## 2023-04-13 ENCOUNTER — Ambulatory Visit (INDEPENDENT_AMBULATORY_CARE_PROVIDER_SITE_OTHER): Payer: Medicare Other

## 2023-04-13 ENCOUNTER — Other Ambulatory Visit: Payer: Self-pay

## 2023-04-13 VITALS — BP 136/82 | HR 49

## 2023-04-13 DIAGNOSIS — M25562 Pain in left knee: Secondary | ICD-10-CM

## 2023-04-13 DIAGNOSIS — M7989 Other specified soft tissue disorders: Secondary | ICD-10-CM | POA: Diagnosis not present

## 2023-04-13 MED ORDER — LORAZEPAM 0.5 MG PO TABS
ORAL_TABLET | ORAL | 0 refills | Status: DC
  Filled 2023-04-13: qty 2, 1d supply, fill #0

## 2023-04-13 NOTE — Progress Notes (Signed)
Rubin Payor, PhD, LAT, ATC acting as a scribe for [name], MD.  [name] is a 67 y.o. female who presents to [facility] at [facility] today for L knee pain. Pt was previously seen by Dr. Katrinka Blazing on 03/10/23 for R finger pain.  Today, pt c/o L knee pain ongoing since last Wednesday, worsening over the weekend. She relates the pain from a move she did at a pilates class and then doing a long bike ride. Pt locates pain to proximal L calf and that anterior-lateral aspect of her L knee.   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   MSK: Left knee mild effusion is visible otherwise normal-appearing. Decreased range of motion lacks full flexion. Intact strength to extension.  Some pain and mildly limited strength to knee flexion. Stable ligamentous exam to valgus and varus stress test.  Anterior drawer test is painful and patient experienced guarding with this test making it nondiagnostic.  Posterior drawer test is normal. Positive medial McMurray's test Fullness palpated at the posterior knee.   Lab and Radiology Results  Diagnostic Limited MSK Ultrasound of: Left knee Quad tendon intact.  Moderate joint effusion is present at the superior patellar space. Patellar tendon normal appearing. Medial joint line mild degenerative appearing Lateral joint line mild degenerative. Posterior knee complex loculated cystic structure posterior knee extending into the proximal calf that could represent a partially drained or ruptured Baker's cyst.  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  X-ray images left knee obtained today personally and independently interpreted No significant DJD.  No acute fractures.  No calcific mass is present at the posterior knee. Await formal radiology review   Assessment and Plan: 67 y.o. female with knee pain and swelling.  Patient has fullness tenderness and pain at the posterior knee associated with a complex cystic structure seen on ultrasound that could represent a Baker's cyst.  I think before we proceed with any more definitive treatment we will get an MRI of the knee to further evaluate the knee cystic structure.  She does have this pancreas neoplasm for about 10 years ago.  I do not think that is a factor here and she had a normal MRI of her abdomen less than a month ago.  However I would like to be more sure of what were dealing with before we attempt aspiration or injection.  Plan for MRI without contrast to further evaluate the cystic structure at the posterior knee.  In the meantime Voltaren gel ibuprofen and Tylenol and compression sleeve. Lorazepam scribed for anxiety with MRI.   PDMP reviewed during this encounter. Orders Placed This Encounter  Procedures   DG Knee AP/LAT W/[place] Left    Standing Status:   Future    Number of Occurrences:   1    Standing Expiration Date:   05/13/2023    Order Specific Question:   Reason for Exam (SYMPTOM  OR DIAGNOSIS REQUIRED)    Answer:   left knee pain    Order  Specific Question:   Preferred imaging location?    Answer:   [facility]   [place] LIMITED JOINT SPACE STRUCTURES LOW LEFT(NO LINKED CHARGES)    Order Specific Question:   Reason for Exam (SYMPTOM  OR DIAGNOSIS REQUIRED)    Answer:   left knee pain    Order Specific Question:   Preferred imaging location?    Answer:   [facility]   MR KNEE LEFT WO CONTRAST    Standing Status:    Future    Standing Expiration Date:   05/13/2023    Order Specific Question:   What is the patient's sedation requirement?    Answer:   Anti-anxiety    Order Specific Question:   Does the patient have a pacemaker or implanted devices?    Answer:   No    Order Specific Question:   Preferred imaging location?    Answer:   [facility] (table limit-350lbs)   Meds ordered this encounter  Medications   LORazepam (ATIVAN) 0.5 MG tablet    Sig: Take 1-2 tabs 30 - 60 min prior to MRI. Do not drive with this medicine.    Dispense:  2 tablet    Refill:  0     Discussed warning signs or symptoms. Please see discharge instructions. Patient expresses understanding.   The above documentation has been reviewed and is accurate and complete [name], M.D.

## 2023-04-13 NOTE — Patient Instructions (Addendum)
Thank you for coming in today.   You should hear from MRI scheduling within 1 week. If you do not hear please let me know.    Please get an Xray today before you leave   Please use Voltaren gel (Generic Diclofenac Gel) up to 4x daily for pain as needed.  This is available over-the-counter as both the name brand Voltaren gel and the generic diclofenac gel.   Use a compression sleeve

## 2023-04-18 ENCOUNTER — Ambulatory Visit (INDEPENDENT_AMBULATORY_CARE_PROVIDER_SITE_OTHER): Payer: Medicare Other

## 2023-04-18 DIAGNOSIS — M7989 Other specified soft tissue disorders: Secondary | ICD-10-CM | POA: Diagnosis not present

## 2023-04-18 DIAGNOSIS — M25562 Pain in left knee: Secondary | ICD-10-CM | POA: Diagnosis not present

## 2023-04-21 NOTE — Progress Notes (Signed)
Left knee x-ray shows joint swelling.  No fracture.  No severe arthritis.

## 2023-04-22 ENCOUNTER — Encounter: Payer: Self-pay | Admitting: Family Medicine

## 2023-04-22 ENCOUNTER — Other Ambulatory Visit (HOSPITAL_BASED_OUTPATIENT_CLINIC_OR_DEPARTMENT_OTHER): Payer: Self-pay

## 2023-04-23 ENCOUNTER — Other Ambulatory Visit (HOSPITAL_BASED_OUTPATIENT_CLINIC_OR_DEPARTMENT_OTHER): Payer: Self-pay

## 2023-04-23 MED ORDER — COVID-19 MRNA VAC-TRIS(PFIZER) 30 MCG/0.3ML IM SUSY
0.3000 mL | PREFILLED_SYRINGE | Freq: Once | INTRAMUSCULAR | 0 refills | Status: AC
  Filled 2023-04-23: qty 0.3, 1d supply, fill #0

## 2023-04-27 ENCOUNTER — Other Ambulatory Visit (HOSPITAL_BASED_OUTPATIENT_CLINIC_OR_DEPARTMENT_OTHER): Payer: Self-pay

## 2023-04-28 NOTE — Telephone Encounter (Signed)
MRI has now been resulted. Forwarding to Dr. Denyse Amass to review.

## 2023-04-29 NOTE — Progress Notes (Signed)
MRI results of the knee are fortunately reassuring/as expected.  You do have some arthritis in the degenerative meniscus tear related to the arthritis.  It does look like you have a partially ruptured Baker's cyst which is the swelling that I was seen in the back of the knee.  However there is nothing that looks like cancer.  I think trying injection is a great idea.  Recommend return to clinic to go over the results in full detail and likely proceed with injection in your knee.

## 2023-04-29 NOTE — Progress Notes (Signed)
[name]    [id number]    [birth year]  Primary Care Physician:[name], Joylene Draft, MD  Referring Physician: Chilton Greathouse, MD [address] [place],  [place] [address]   Chief complaint:   Chief Complaint  Patient presents with   Irritable Bowel Syndrome    No complaints today   Status post distal pancreatectomy    To discuss MRI done in Aug   HPI: 67 year old very pleasant female here for follow-up visit for irritable bowel syndrome.  Last seen on 02-09-22  Today, she reports feeling well overall. IBS symptoms are well managed at this time. She states that due to her frequent BM, she can experience rectal discomfort. She is requesting a refill for nitroglycerin. She states that she often has the urge to have a BM after having a meal but denies any loose stool .     We also reviewed her recent MRI and discussed the results and recommendations for any further testing. We recommended to follow up if she is experiencing any abnormalities or symptoms. She is also wondering if she can start Semaglutide for weight loss.   Patient denies diarrhea, constipation, nausea, blood in stool, black stool, vomiting, abdominal pain, bloating, unintentional weight loss, reflux, dysphagia.  GI Hx: MR Abdomen w wo contrast 03-19-23 1. Status post distal pancreatectomy. No pancreatic ductal dilatation or surrounding inflammatory changes. Specifically, distal pancreatic ductal dilatation reported on prior examination is not appreciated on today's examination. 2. Varices throughout the left upper quadrant, secondary to resection of the splenic vein.  MR Abdomen w wo contrast 03-06-22 1. 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 New minimal dilatation (3 mm diameter) of the main pancreatic duct in the pancreatic body near the pancreatectomy margin. Suggest continued MRI abdomen surveillance in 12 months. 2. No evidence of metastatic disease in  the abdomen.  Colonoscopy 12-05-19 - The examined portion of the ileum was normal.  - One 3 mm polyp in the cecum, removed with a cold snare. Resected and retrieved.  - MILD Diverticulosis at the hepatic flexure.  - MODERATE Diverticulosis in the recto-sigmoid colon and in the sigmoid colon.  - HEME POSITIVE STOOLS/INTERMITTENT RECTAL BLEEDING DUE TO and internal hemorrhoids.  - Tortuous LEFT colon. A. COLON, CECAL POLYPECTOMY:  -  Tubular adenoma (1 of 1 fragments)  -  No high grade dysplasia or malignancy identified   Colonoscopy 01-04-17 - There was significant looping of the rectosigmoid colon.  - One 4 mm polyp in the rectum, removed with a cold biopsy forceps. Resected and retrieved.  - Internal hemorrhoids. Rectum, polyp(s) - HYPERPLASTIC POLYP. - NO DYSPLASIA OR MALIGNANCY.  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 Darrick Penna   3 mm tubular adenoma removed from cecum, diverticulosis and internal hemorrhoids   Family history of colon cancer in mother   CT abdomen pelvis with contrast January 2021: Negative for any acute pathology, no new pancreatic cysts or lesions.   Current Outpatient Medications:    ascorbic acid (VITAMIN C) 100 MG tablet, Take 100 mg by mouth daily., Disp: , Rfl:    b complex vitamins tablet, Take 1 tablet by mouth daily., Disp: , Rfl:    Calcium Carb-Cholecalciferol 600-800 MG-UNIT TABS, Take 1 tablet  by mouth daily., Disp: , Rfl:    Diclofenac Sodium 2 % SOLN, Place 2 g onto the skin 2 (two) times daily., Disp: 112 g, Rfl: 3   dimenhyDRINATE (DRAMAMINE) 50 MG tablet, Take 50 mg by mouth in the morning and at bedtime., Disp: , Rfl:    ELDERBERRY PO, Take 1 tablet by mouth daily., Disp: , Rfl:    estradiol (VIVELLE-DOT) 0.025 MG/24HR, Place 1 patch onto the skin 2  (two) times a week., Disp: 24 patch, Rfl: 3   fexofenadine (ALLEGRA) 180 MG tablet, Take 180 mg by mouth daily as needed for allergies or rhinitis., Disp: , Rfl:    fluticasone (FLONASE) 50 MCG/ACT nasal spray, 1 spray in each nostril Nasally Once a day or prn 30 day(s), Disp: 16 g, Rfl: 5   ipratropium (ATROVENT) 0.06 % nasal spray, INSTILL 1 TO 2 SPRAYS IN EACH NOSTRIL 3-4 TIMES A DAY AS NEEDED, Disp: 15 mL, Rfl: 3   Lactobacillus (ACIDOPHILUS PO), Take 1 capsule by mouth daily., Disp: , Rfl:    levocetirizine (XYZAL) 5 MG tablet, Take 1 tablet (5 mg total) by mouth daily in the evening, Disp: 30 tablet, Rfl: 3   MELATONIN PO, Take 1 tablet by mouth at bedtime., Disp: , Rfl:    meloxicam (MOBIC) 7.5 MG tablet, Take 1 tablet (7.5 mg total) by mouth daily., Disp: 90 tablet, Rfl: 0   montelukast (SINGULAIR) 10 MG tablet, Take 1 tablet (10 mg total) by mouth daily., Disp: 90 tablet, Rfl: 3   Multiple Vitamin (MULTIVITAMIN) tablet, Take 1 tablet by mouth daily., Disp: , Rfl:    Omega-3 Fatty Acids (FISH OIL PO), Take 1 capsule by mouth daily., Disp: , Rfl:    RESTASIS 0.05 % ophthalmic emulsion, Place 1 drop into both eyes 2 (two) times daily., Disp: , Rfl:    RSV vaccine recomb adjuvanted (AREXVY) 120 MCG/0.5ML injection, as directed Intramuscular once, Disp: 0.5 mL, Rfl: 0   tretinoin (RETIN-A) 0.05 % cream, Apply 1 application on the skin NIGHTLY, Disp: 45 g, Rfl: 2   Wheat Dextrin (BENEFIBER PO), Take 1 packet by mouth daily., Disp: , Rfl:    Allergies as of 05/03/2023 - Review Complete 05/03/2023  Allergen Reaction Noted   Molds & smuts  05/03/2023   Other  05/03/2023    Past Medical History:  Diagnosis Date   Basal cell carcinoma    skin cancer   Colon polyps    Diverticulosis    CT   Fibroids 09/07/2013   IBS (irritable bowel syndrome)    PONV (postoperative nausea and vomiting)    non recent    S/P hysterectomy with oophorectomy 09/07/2013   Seasonal allergies    SVD  (spontaneous vaginal delivery)    x 1    Past Surgical History:  Procedure Laterality Date   ABDOMINAL HYSTERECTOMY     COLONOSCOPY     2 or 3   COLONOSCOPY N/A 01/04/2017   Dr. Darrick Penna: Significant looping of the rectosigmoid colon, 4 mm hyperplastic polyp removed from rectum, internal hemorrhoids.  Next colonoscopy in 5 to 10 years with MAC and ultra slim colonoscope.   COLONOSCOPY WITH PROPOFOL N/A 12/05/2019   Procedure: COLONOSCOPY WITH PROPOFOL;  Surgeon: West Bali, MD;  Location: [facility];  Service: Endoscopy;  Laterality: N/A;  7:30am   DILATION AND CURETTAGE OF UTERUS     x 3   EUS N/A 11/16/2013   Dr. Christella Hartigan: parenchyma in tail of pancreas suggests chronic pancreatitis,  abrupt changes in main pancreatic duct in tail becomes dilated up to 4-69mm, numerous associated dilated side branches, no solid mass. bx most c/w IPMN   EYE SURGERY     lasik bilateral   LAPAROSCOPY N/A 09/25/2013   Procedure: LAPAROSCOPY OPERATIVE;  Surgeon: Robley Fries, MD;  Location: [facility];  Service: Gynecology;  Laterality: N/A;   PANCREATECTOMY N/A 12/07/2013   Procedure: LAPAROSCOPIC DISTAL  PANCREATECTOMY;  Surgeon: Almond Lint, MD;  Location: [facility];  Service: General;  Laterality: N/A;   POLYPECTOMY  01/04/2017   Procedure: POLYPECTOMY;  Surgeon: West Bali, MD;  Location: [facility];  Service: Endoscopy;;  rectal   POLYPECTOMY  12/05/2019   Procedure: POLYPECTOMY;  Surgeon: West Bali, MD;  Location: [facility];  Service: Endoscopy;;   ROBOTIC ASSISTED TOTAL HYSTERECTOMY WITH BILATERAL SALPINGO OOPHERECTOMY Bilateral 09/07/2013   Procedure: ROBOTIC ASSISTED TOTAL HYSTERECTOMY WITH BILATERAL SALPINGO OOPHORECTOMY;  Surgeon: Robley Fries, MD;  Location: [facility];  Service: Gynecology;  Laterality: Bilateral;   WISDOM TOOTH EXTRACTION      Family History  Problem Relation Age of Onset   COPD Mother    Colon cancer Mother    Lung cancer Mother 34   Diabetes Brother     Diverticulitis Brother    Heart disease Maternal Grandfather    Stomach cancer Neg Hx    Esophageal cancer Neg Hx    Pancreatic cancer Neg Hx     Social History   Socioeconomic History   Marital status: Married    Spouse name: Not on file   Number of children: 1   Years of education: Not on file   Highest education level: Not on file  Occupational History   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[employer]    Comment: Production designer, theatre/television/film, VP Materials engineer  Tobacco Use   Smoking status: Former    Current packs/day: 0.00    Average packs/day: 1 pack/day for 10.0 years (10.0 ttl pk-yrs)    Types: Cigarettes    Start date: 01/01/1977    Quit date: 01/02/1987    Years since quitting: 36.3   Smokeless tobacco: Never  Vaping Use   Vaping status: Never Used  Substance and Sexual Activity   Alcohol use: Yes    Comment: 2-3 glases of wine on weekend nights   Drug use: No   Sexual activity: Not Currently    Birth control/protection: Post-menopausal, Surgical  Other Topics Concern   Not on file  Social History Narrative   Not on file   Social Determinants of Health   Financial Resource Strain: Not on file  Food Insecurity: Not on file  Transportation Needs: Not on file  Physical Activity: Not on file  Stress: Not on file  Social Connections: Not on file  Intimate Partner Violence: Not on file    Review of systems: Review of Systems  Constitutional:  Negative for unexpected weight change.  HENT:  Negative for trouble swallowing.   Gastrointestinal:  Positive for rectal pain. Negative for abdominal distention, abdominal pain, anal bleeding, blood in stool, constipation, diarrhea, nausea and vomiting.      Physical Exam: Vitals:   05/03/23 1423  BP: (!) 112/58  Pulse: 68    Body mass index is 26.88 kg/m. General: well-appearing   Eyes: sclera anicteric, no redness ENT: oral mucosa moist without lesions, no cervical or supraclavicular lymphadenopathy CV: RRR, no  JVD, no peripheral edema Resp: clear to auscultation bilaterally, normal RR and  effort noted GI: soft, no tenderness, with active bowel sounds. No guarding or palpable organomegaly noted. Skin; warm and dry, no rash or jaundice noted Neuro: awake, alert and oriented x 3. Normal gross motor function and fluent speech   Data Reviewed:  Reviewed labs, radiology imaging, old records and pertinent past GI work up   Assessment and Plan/Recommendations:  67 year old very pleasant female with complaints of change in bowel habits, intermittent diarrhea likely irritable bowel syndrome predominant diarrhea Improved with dietary modifications and Benefiber Continue with high-fiber diet and increase water intake    Pancreatic IPMN s/p distal pancreatectomy 2015. No new pancreatic lesions, overall low risk.   Discussed holding off continued surveillance imaging given no evidence of any new pancreatic cyst  Personal history of colon polyps and family history of colon cancer: Due for recall colonoscopy May 2026    The patient was provided an opportunity to ask questions and all were answered. The patient agreed with the plan and demonstrated an understanding of the instructions.  Iona Beard , MD    CC: Chilton Greathouse, MD   Ladona Mow Hewitt Shorts as a scribe for Marsa Aris, MD.,have documented all relevant documentation on the behalf of Marsa Aris, MD,as directed by  Marsa Aris, MD while in the presence of Marsa Aris, MD.   I, Marsa Aris, MD, have reviewed all documentation for this visit. The documentation on 05/03/23 for the exam, diagnosis, procedures, and orders are all accurate and complete.

## 2023-05-03 ENCOUNTER — Ambulatory Visit: Payer: Self-pay

## 2023-05-03 ENCOUNTER — Ambulatory Visit (INDEPENDENT_AMBULATORY_CARE_PROVIDER_SITE_OTHER): Payer: Medicare Other | Admitting: Family Medicine

## 2023-05-03 ENCOUNTER — Encounter: Payer: Self-pay | Admitting: Gastroenterology

## 2023-05-03 ENCOUNTER — Ambulatory Visit (INDEPENDENT_AMBULATORY_CARE_PROVIDER_SITE_OTHER): Payer: Medicare Other | Admitting: Gastroenterology

## 2023-05-03 VITALS — BP 118/74 | HR 52 | Ht 65.0 in

## 2023-05-03 VITALS — BP 112/58 | HR 68 | Ht 65.0 in | Wt 161.5 lb

## 2023-05-03 DIAGNOSIS — K589 Irritable bowel syndrome without diarrhea: Secondary | ICD-10-CM

## 2023-05-03 DIAGNOSIS — M25562 Pain in left knee: Secondary | ICD-10-CM

## 2023-05-03 DIAGNOSIS — K602 Anal fissure, unspecified: Secondary | ICD-10-CM

## 2023-05-03 MED ORDER — AMBULATORY NON FORMULARY MEDICATION: 2 refills | Status: AC

## 2023-05-03 NOTE — Patient Instructions (Addendum)
Thank you for coming in today.   You received an injection today. Seek immediate medical attention if the joint becomes red, extremely painful, or is oozing fluid.   I recommend you obtained a compression sleeve to help with your joint problems. There are many options on the market however I recommend obtaining a full knee Body Helix compression sleeve.  You can find information (including how to appropriate measure yourself for sizing) can be found at www.[email or web address].ch.  Many of these products are health savings account (HSA) eligible.   You can use the compression sleeve at any time throughout the day but is most important to use while being active as well as for 2 hours post-activity.   It is appropriate to ice following activity with the compression sleeve in place.   Keep riding.

## 2023-05-03 NOTE — Patient Instructions (Addendum)
We have sent a prescription for nitroglycerin 0.125% gel to [facility]. You should apply a pea size amount to your rectum three times daily x 6-8 weeks.  [facility]'s information is below: Address: [address], [place], [place] [address]  [phone]  *Please DO NOT go directly from our office to pick up this medication! Give the pharmacy 1 day to process the prescription as this is compounded and takes time to make.   Take Benefiber 1 teaspoons 2-3 times daily  _______________________________________________________  If your blood pressure at your visit was 140/90 or greater, please contact your primary care physician to follow up on this.  _______________________________________________________  If you are age 66 or older, your body mass index should be between 23-30. Your Body mass index is 26.88 kg/m. If this is out of the aforementioned range listed, please consider follow up with your Primary Care Provider.  If you are age 100 or younger, your body mass index should be between 19-25. Your Body mass index is 26.88 kg/m. If this is out of the aformentioned range listed, please consider follow up with your Primary Care Provider.   ________________________________________________________  The [facility] providers would like to encourage you to use [facility] to communicate with providers for non-urgent requests or questions.  Due to long hold times on the telephone, sending your provider a message by [facility] may be a faster and more efficient way to get a response.  Please allow 48 business hours for a response.  Please remember that this is for non-urgent requests.  _______________________________________________________   I appreciate the  opportunity to care for you  Thank You   Marsa Aris , MD

## 2023-05-03 NOTE — Progress Notes (Signed)
   Sabrina Payor, PhD, LAT, ATC acting as a scribe for Sabrina Graham, MD.  [name] is a 67 y.o. female who presents to [facility] at [facility] today for f/u L knee pain w/ MRI review. Pt was last seen by Dr. Denyse Amass on 04/13/23 and a MRI was ordered and she was advised to use Voltaren gel, IBU/Tylenol, and a compression sleeve. Lorazepam was also prescribed for anxiety w/ MRI.  Today, pt reports R knee pain is about the same. Painful along the lateral aspect of her R knee. She notes some swelling is present, but her AROM has improved some.   Dx imaging: 04/18/23 L knee MRI  04/13/23 L knee XR  Pertinent review of systems: No fevers or chills  Relevant historical information: Avid road cyclist   Exam:  BP 118/74   Pulse (!) 52   Ht 5\' 5"  (1.651 m)   SpO2 97%   BMI 26.88 kg/m  General: Well Developed, well nourished, and in no acute distress.   MSK: Left knee moderate effusion. Normal motion. Tender palpation lateral joint line. Nontender posterior knee Baker's cyst.    Lab and Radiology Results  Procedure: Real-time Ultrasound Guided Injection of left knee joint superior lateral patella space Device: Philips Affiniti 50G/GE Logiq Images permanently stored and available for review in PACS Ultrasound evaluation prior to injection does show a moderate-sized Baker's cyst and a degenerative appearing lateral meniscus. Verbal informed consent obtained.  Discussed risks and benefits of procedure. Warned about infection, bleeding, hyperglycemia damage to structures among others. Patient expresses understanding and agreement Time-out conducted.   Noted no overlying erythema, induration, or other signs of local infection.   Skin prepped in a sterile fashion.   Local anesthesia: Topical Ethyl chloride.   With sterile technique and under real time ultrasound guidance: 40 mg of Kenalog and 2 mL of Marcaine injected into knee joint. Fluid seen entering the joint  capsule.   Completed without difficulty   Pain immediately resolved suggesting accurate placement of the medication.   Advised to call if fevers/chills, erythema, induration, drainage, or persistent bleeding.   Images permanently stored and available for review in the ultrasound unit.  Impression: Technically successful ultrasound guided injection.       Assessment and Plan: 67 y.o. 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  She does not have significant mechanical symptoms and is not much bothered by the posterior medial knee.  The majority of her pain and discomfort are at the lateral joint line which would be due to the degenerative changes and meniscus tear.  We talked about options.  Plan for steroid injection and continue quad strengthening.  Also recommend compression sleeve.  If not sufficient may consider surgical consultation.   PDMP not reviewed this encounter. Orders Placed This Encounter  Procedures   [place] LIMITED JOINT SPACE STRUCTURES LOW LEFT(NO LINKED CHARGES)    Order Specific Question:   Reason for Exam (SYMPTOM  OR DIAGNOSIS REQUIRED)    Answer:   left knee pain    Order Specific Question:   Preferred imaging location?    Answer:   [facility]   No orders of the defined types were placed in this encounter.    Discussed warning signs or symptoms. Please see discharge instructions. Patient expresses understanding.   The above documentation has been reviewed and is accurate and complete [name], M.D.

## 2023-05-11 NOTE — Progress Notes (Unsigned)
[name] [address] [place] [address] Phone: [phone] Subjective:    I'm seeing this patient by the request  of:  [name], [name], MD  CC: back and neck exam f/u   [id number]  [name] is a 67 y.o. female coming in with complaint of back and neck pain. OMT on 03/10/2023. Patient states overall her knee seems to be doing relatively well.  Still having some mild back pain but nothing severe.  Patient did have the knee where patient did have a ruptured Baker's cyst.  Had aspiration by another provider and states that it is better but still not great.  Has been able to start writing her bike which has been helpful though.  Medications patient has been prescribed:   Taking:         Reviewed prior external information including notes and imaging from previsou exam, outside providers and external EMR if available.   As well as notes that were available from care everywhere and other healthcare systems.  Past medical history, social, surgical and family history all reviewed in electronic medical record.  No pertanent information unless stated regarding to the chief complaint.   Past Medical History:  Diagnosis Date   Basal cell carcinoma    skin cancer   Colon polyps    Diverticulosis    CT   Fibroids 09/07/2013   IBS (irritable bowel syndrome)    PONV (postoperative nausea and vomiting)    non recent    S/P hysterectomy with oophorectomy 09/07/2013   Seasonal allergies    SVD (spontaneous vaginal delivery)    x 1    Allergies  Allergen Reactions   Molds & Smuts     Other Reaction(s): Not available   Other     Other Reaction(s): Not available     Review of Systems:  No headache, visual changes, nausea, vomiting, diarrhea, constipation, dizziness, abdominal pain, skin rash, fevers, chills, night sweats, weight loss, swollen lymph nodes, body aches, joint swelling, chest pain, shortness of breath, mood changes.  POSITIVE muscle aches  Objective  Height 5\' 5"  (1.651 m).   General: No apparent distress alert and oriented x3 mood and affect normal, dressed appropriately.  HEENT: Pupils equal, extraocular movements intact  Respiratory: Patient's speak in full sentences and does not appear short of breath  Cardiovascular: No lower extremity edema, non tender, no erythema  Low back does have significant tightness noted.  Patient does have tightness around the sacroiliac joint bilaterally.  Seems to be right greater than left.  Osteopathic findings  C5 flexed rotated and side bent left T3 extended rotated and side bent right inhaled rib T5 extended rotated and side bent left L2 flexed rotated and side bent right Sacrum right on right    Assessment and Plan:  Low back pain No acute continue to monitor patient's knee but unfortunately I do think that there is a ruptured Baker's cyst that still needs to be draining for some time.  Did discuss that it can reaccumulate.  Did respond well though to osteopathic manipulation of the back.  Discussed avoiding certain activities otherwise.  Follow-up with me again in 6 to 8 weeks.    Nonallopathic problems  Decision today to treat with OMT was based on Physical Exam  After verbal consent patient was treated with HVLA, ME, FPR techniques in cervical, rib, thoracic, lumbar, and sacral  areas  Patient tolerated the procedure well with improvement in symptoms  Patient given  exercises, stretches and lifestyle modifications  See medications in patient instructions if given  Patient will follow up in 4-8 weeks    The above documentation has been reviewed and is accurate and complete Judi Saa, DO          Note: This dictation was prepared with Dragon dictation along with smaller phrase technology. Any transcriptional errors that result from this process are unintentional.

## 2023-05-12 ENCOUNTER — Encounter: Payer: Self-pay | Admitting: Family Medicine

## 2023-05-12 ENCOUNTER — Ambulatory Visit: Payer: Medicare Other | Admitting: Family Medicine

## 2023-05-12 VITALS — Ht 65.0 in

## 2023-05-12 DIAGNOSIS — M9901 Segmental and somatic dysfunction of cervical region: Secondary | ICD-10-CM

## 2023-05-12 DIAGNOSIS — M9904 Segmental and somatic dysfunction of sacral region: Secondary | ICD-10-CM

## 2023-05-12 DIAGNOSIS — M9902 Segmental and somatic dysfunction of thoracic region: Secondary | ICD-10-CM

## 2023-05-12 DIAGNOSIS — M9908 Segmental and somatic dysfunction of rib cage: Secondary | ICD-10-CM | POA: Diagnosis not present

## 2023-05-12 DIAGNOSIS — M545 Low back pain, unspecified: Secondary | ICD-10-CM

## 2023-05-12 DIAGNOSIS — G8929 Other chronic pain: Secondary | ICD-10-CM

## 2023-05-12 DIAGNOSIS — M9903 Segmental and somatic dysfunction of lumbar region: Secondary | ICD-10-CM

## 2023-05-12 NOTE — Assessment & Plan Note (Signed)
No acute continue to monitor patient's knee but unfortunately I do think that there is a ruptured Baker's cyst that still needs to be draining for some time.  Did discuss that it can reaccumulate.  Did respond well though to osteopathic manipulation of the back.  Discussed avoiding certain activities otherwise.  Follow-up with me again in 6 to 8 weeks.

## 2023-05-12 NOTE — Patient Instructions (Signed)
See you again in 6-8 weeks Good to see you!

## 2023-05-14 ENCOUNTER — Other Ambulatory Visit (HOSPITAL_BASED_OUTPATIENT_CLINIC_OR_DEPARTMENT_OTHER): Payer: Self-pay

## 2023-05-14 MED ORDER — INFLUENZA VAC A&B SURF ANT ADJ 0.5 ML IM SUSY
0.5000 mL | PREFILLED_SYRINGE | Freq: Once | INTRAMUSCULAR | 0 refills | Status: AC
  Filled 2023-05-14: qty 0.5, 1d supply, fill #0

## 2023-05-17 ENCOUNTER — Other Ambulatory Visit (HOSPITAL_BASED_OUTPATIENT_CLINIC_OR_DEPARTMENT_OTHER): Payer: Self-pay

## 2023-05-18 ENCOUNTER — Other Ambulatory Visit (HOSPITAL_BASED_OUTPATIENT_CLINIC_OR_DEPARTMENT_OTHER): Payer: Self-pay

## 2023-06-02 ENCOUNTER — Other Ambulatory Visit (HOSPITAL_BASED_OUTPATIENT_CLINIC_OR_DEPARTMENT_OTHER): Payer: Self-pay

## 2023-06-09 ENCOUNTER — Encounter: Payer: Self-pay | Admitting: Family Medicine

## 2023-06-10 ENCOUNTER — Other Ambulatory Visit (HOSPITAL_BASED_OUTPATIENT_CLINIC_OR_DEPARTMENT_OTHER): Payer: Self-pay

## 2023-06-10 ENCOUNTER — Other Ambulatory Visit: Payer: Self-pay | Admitting: Family Medicine

## 2023-06-10 MED ORDER — MELOXICAM 7.5 MG PO TABS
7.5000 mg | ORAL_TABLET | Freq: Every day | ORAL | 0 refills | Status: DC
  Filled 2023-06-10: qty 90, 90d supply, fill #0

## 2023-06-17 ENCOUNTER — Ambulatory Visit: Payer: Medicare Other | Admitting: Family Medicine

## 2023-06-17 VITALS — BP 122/78 | HR 55 | Ht 65.0 in

## 2023-06-17 DIAGNOSIS — M9908 Segmental and somatic dysfunction of rib cage: Secondary | ICD-10-CM

## 2023-06-17 DIAGNOSIS — M9903 Segmental and somatic dysfunction of lumbar region: Secondary | ICD-10-CM

## 2023-06-17 DIAGNOSIS — M25562 Pain in left knee: Secondary | ICD-10-CM | POA: Diagnosis not present

## 2023-06-17 DIAGNOSIS — M9904 Segmental and somatic dysfunction of sacral region: Secondary | ICD-10-CM | POA: Diagnosis not present

## 2023-06-17 DIAGNOSIS — M9902 Segmental and somatic dysfunction of thoracic region: Secondary | ICD-10-CM

## 2023-06-17 DIAGNOSIS — M9901 Segmental and somatic dysfunction of cervical region: Secondary | ICD-10-CM | POA: Diagnosis not present

## 2023-06-17 NOTE — Progress Notes (Signed)
[name] [address] [place] [address] Phone: [phone] Subjective:   [name], am serving as a scribe for Dr. Antoine Primas.  I'm seeing this patient by the request  of:  [name], [name], MD  CC: Knee pain and back pain  [id number]  [name] is a 67 y.o. female coming in with complaint of back and neck pain. OMT on 05/07/2023. Here for knee injections as well. Patient states does have some tightness noted.  Patient feels that the knee has been swelling again.  Saw another provider and given an injection but did not seem to make any significant improvement.  States now it is affecting daily activities on a more regular basis.           Reviewed prior external information including notes and imaging from previsou exam, outside providers and external EMR if available.   As well as notes that were available from care everywhere and other healthcare systems.  Past medical history, social, surgical and family history all reviewed in electronic medical record.  No pertanent information unless stated regarding to the chief complaint.   Past Medical History:  Diagnosis Date   Basal cell carcinoma    skin cancer   Colon polyps    Diverticulosis    CT   Fibroids 09/07/2013   IBS (irritable bowel syndrome)    PONV (postoperative nausea and vomiting)    non recent    S/P hysterectomy with oophorectomy 09/07/2013   Seasonal allergies    SVD (spontaneous vaginal delivery)    x 1    Allergies  Allergen Reactions   Molds & Smuts     Other Reaction(s): Not available   Other     Other Reaction(s): Not available     Review of Systems:  No headache, visual changes, nausea, vomiting, diarrhea, constipation, dizziness, abdominal pain, skin rash, fevers, chills, night sweats, weight loss, swollen lymph nodes, body aches, joint swelling, chest pain, shortness of breath, mood changes. POSITIVE muscle aches  Objective  Blood  pressure 122/78, pulse (!) 55, height 5\' 5"  (1.651 m), SpO2 95%.   General: No apparent distress alert and oriented x3 mood and affect normal, dressed appropriately.  HEENT: Pupils equal, extraocular movements intact  Respiratory: Patient's speak in full sentences and does not appear short of breath  Cardiovascular: No lower extremity edema, non tender, no erythema  Left knee does have some arthritic changes noted.  Some crepitus noted.  Patient does have trace effusion of the left knee noted.  Low back exam significant tightness noted in the paraspinal musculature.  Patient does have tightness in the bilateral paraspinal musculature of the lumbar spine.  Worsening pain with extension.   After informed written and verbal consent, patient was seated on exam table. Left knee was prepped with alcohol swab and utilizing anterolateral approach, patient's left knee space was injected with 4:1  marcaine 0.5%: Kenalog 40mg /dL. Patient tolerated the procedure well without immediate complications.   Osteopathic findings  C2 flexed rotated and side bent right C6 flexed rotated and side bent left T3 extended rotated and side bent right inhaled rib T9 extended rotated and side bent left L2 flexed rotated and side bent right L5 flexed rotated and side bent left Sacrum right on right       Assessment and Plan:  Knee pain Acute worsening knee pain.  Discussed icing regimen and home exercises, discussed which activities to do and which ones to avoid.  Increase activity slowly over the course of next several weeks.  Discussed icing regimen.  Follow-up again in 6 to 8 weeks:    Nonallopathic problems  Decision today to treat with OMT was based on Physical Exam  After verbal consent patient was treated with HVLA, ME, FPR techniques in cervical, rib, thoracic, lumbar, and sacral  areas  Patient tolerated the procedure well with improvement in symptoms  Patient given exercises, stretches and  lifestyle modifications  See medications in patient instructions if given  Patient will follow up in 4-8 weeks     The above documentation has been reviewed and is accurate and complete Judi Saa, DO         Note: This dictation was prepared with Dragon dictation along with smaller phrase technology. Any transcriptional errors that result from this process are unintentional.

## 2023-06-18 ENCOUNTER — Other Ambulatory Visit (HOSPITAL_BASED_OUTPATIENT_CLINIC_OR_DEPARTMENT_OTHER): Payer: Self-pay

## 2023-06-20 ENCOUNTER — Encounter: Payer: Self-pay | Admitting: Family Medicine

## 2023-06-20 NOTE — Assessment & Plan Note (Signed)
Acute worsening knee pain.  Discussed icing regimen and home exercises, discussed which activities to do and which ones to avoid.  Increase activity slowly over the course of next several weeks.  Discussed icing regimen.  Follow-up again in 6 to 8 weeks:

## 2023-07-06 ENCOUNTER — Other Ambulatory Visit (HOSPITAL_BASED_OUTPATIENT_CLINIC_OR_DEPARTMENT_OTHER): Payer: Self-pay

## 2023-07-13 ENCOUNTER — Ambulatory Visit: Payer: Medicare Other | Admitting: Family Medicine

## 2023-08-11 NOTE — Progress Notes (Signed)
 [name] [address] [place] [address] Phone: [phone] Subjective:   LILLETTE [name], am serving as a scribe for Dr. Arthea Claudene.  I'm seeing this patient by the request  of:  [name], [name], MD  CC: Back and neck pain follow-up  [name]  [name] is a 68 y.o. female coming in with complaint of back and neck pain. OMT 06/17/2023. Patient states that she is doing ok.   L knee is still bothering her.   Also mentions that her grip strength is not what it has been. Hands have been cramping during the day quite frequently.   Also having pain in the R hip over the GT.   Medications patient has been prescribed: Meloxicam   Taking: Intermittently yes         Reviewed prior external information including notes and imaging from previsou exam, outside providers and external EMR if available.   As well as notes that were available from care everywhere and other healthcare systems.  Past medical history, social, surgical and family history all reviewed in electronic medical record.  No pertanent information unless stated regarding to the chief complaint.   Past Medical History:  Diagnosis Date   Basal cell carcinoma    skin cancer   Colon polyps    Diverticulosis    CT   Fibroids 09/07/2013   IBS (irritable bowel syndrome)    PONV (postoperative nausea and vomiting)    non recent    S/P hysterectomy with oophorectomy 09/07/2013   Seasonal allergies    SVD (spontaneous vaginal delivery)    x 1    Allergies  Allergen Reactions   Molds & Smuts     Other Reaction(s): Not available   Other     Other Reaction(s): Not available     Review of Systems:  No headache, visual changes, nausea, vomiting, diarrhea, constipation, dizziness, abdominal pain, skin rash, fevers, chills, night sweats, weight loss, swollen lymph nodes,  joint swelling, chest pain, shortness of breath, mood changes. POSITIVE muscle aches, body  aches, snoring  Objective  Blood pressure 124/78, pulse 78, height 5' 5 (1.651 m), SpO2 97%.   General: No apparent distress alert and oriented x3 mood and affect normal, dressed appropriately.  HEENT: Pupils equal, extraocular movements intact  Respiratory: Patient's speak in full sentences and does not appear short of breath  Cardiovascular: No lower extremity edema, non tender, no erythema  Gait relatively normal. MSK:  Back does have some loss of lordosis noted.  Some tenderness to palpation in the paraspinal musculature.  Some tightness with the neck with sidebending.  Osteopathic findings  C3 flexed rotated and side bent right C5 flexed rotated and side bent left T3 extended rotated and side bent right inhaled rib T9 extended rotated and side bent left L2 flexed rotated and side bent right Sacrum right on right    Assessment and Plan:  Low back pain Chronic problem that is multifactorial.  Continue to work on core strengthening.  I think patient will do relatively well with this treatment still.  Has meloxicam  7.5 mg for breakthrough.  Follow-up with me again in 6 to 8 weeks otherwise.    Nonallopathic problems  Decision today to treat with OMT was based on Physical Exam  After verbal consent patient was treated with HVLA, ME, FPR techniques in cervical, rib, thoracic, lumbar, and sacral  areas  Patient tolerated the procedure well with improvement in symptoms  Patient given exercises,  stretches and lifestyle modifications  See medications in patient instructions if given  Patient will follow up in 4-8 weeks     The above documentation has been reviewed and is accurate and complete Mubashir Mallek M Tyjai Matuszak, DO         Note: This dictation was prepared with Dragon dictation along with smaller phrase technology. Any transcriptional errors that result from this process are unintentional.

## 2023-08-12 ENCOUNTER — Other Ambulatory Visit (HOSPITAL_BASED_OUTPATIENT_CLINIC_OR_DEPARTMENT_OTHER): Payer: Self-pay

## 2023-08-12 MED ORDER — AZITHROMYCIN 250 MG PO TABS
ORAL_TABLET | ORAL | 0 refills | Status: AC
  Filled 2023-08-12: qty 6, 5d supply, fill #0

## 2023-08-13 ENCOUNTER — Ambulatory Visit (INDEPENDENT_AMBULATORY_CARE_PROVIDER_SITE_OTHER): Payer: Medicare Other | Admitting: Family Medicine

## 2023-08-13 VITALS — BP 124/78 | HR 78 | Ht 65.0 in

## 2023-08-13 DIAGNOSIS — G8929 Other chronic pain: Secondary | ICD-10-CM

## 2023-08-13 DIAGNOSIS — M9902 Segmental and somatic dysfunction of thoracic region: Secondary | ICD-10-CM

## 2023-08-13 DIAGNOSIS — M9908 Segmental and somatic dysfunction of rib cage: Secondary | ICD-10-CM | POA: Diagnosis not present

## 2023-08-13 DIAGNOSIS — M9901 Segmental and somatic dysfunction of cervical region: Secondary | ICD-10-CM | POA: Diagnosis not present

## 2023-08-13 DIAGNOSIS — M9903 Segmental and somatic dysfunction of lumbar region: Secondary | ICD-10-CM | POA: Diagnosis not present

## 2023-08-13 DIAGNOSIS — M545 Low back pain, unspecified: Secondary | ICD-10-CM

## 2023-08-13 DIAGNOSIS — M9904 Segmental and somatic dysfunction of sacral region: Secondary | ICD-10-CM | POA: Diagnosis not present

## 2023-08-13 NOTE — Patient Instructions (Signed)
Good to see you See me in 6-8 weeks 

## 2023-08-14 ENCOUNTER — Encounter: Payer: Self-pay | Admitting: Family Medicine

## 2023-08-14 NOTE — Assessment & Plan Note (Signed)
 Chronic problem that is multifactorial.  Continue to work on core strengthening.  I think patient will do relatively well with this treatment still.  Has meloxicam 7.5 mg for breakthrough.  Follow-up with me again in 6 to 8 weeks otherwise.

## 2023-08-25 ENCOUNTER — Other Ambulatory Visit (HOSPITAL_BASED_OUTPATIENT_CLINIC_OR_DEPARTMENT_OTHER): Payer: Self-pay

## 2023-09-21 NOTE — Progress Notes (Unsigned)
 [name] [address] [place] [address] Phone: [phone] Subjective:   Bruce Donath, am serving as a scribe for Dr. Antoine Primas.  I'm seeing this patient by the request  of:  [name], [name], MD  CC: back and neck pain follow up   [id number]  [name] is a 68 y.o. female coming in with complaint of back and neck pain. OMT 08/13/2023. Patient states that she is having pain in scapula recently.   L knee continues to swell. Wears brace with activity and this helps.   Medications patient has been prescribed: Meloxicam  Taking:         Reviewed prior external information including notes and imaging from previsou exam, outside providers and external EMR if available.   As well as notes that were available from care everywhere and other healthcare systems.  Past medical history, social, surgical and family history all reviewed in electronic medical record.  No pertanent information unless stated regarding to the chief complaint.   Past Medical History:  Diagnosis Date   Basal cell carcinoma    skin cancer   Colon polyps    Diverticulosis    CT   Fibroids 09/07/2013   IBS (irritable bowel syndrome)    PONV (postoperative nausea and vomiting)    non recent    S/P hysterectomy with oophorectomy 09/07/2013   Seasonal allergies    SVD (spontaneous vaginal delivery)    x 1    Allergies  Allergen Reactions   Molds & Smuts     Other Reaction(s): Not available   Other     Other Reaction(s): Not available     Review of Systems:  No headache, visual changes, nausea, vomiting, diarrhea, constipation, dizziness, abdominal pain, skin rash, fevers, chills, night sweats, weight loss, swollen lymph nodes, body aches, joint swelling, chest pain, shortness of breath, mood changes. POSITIVE muscle aches  Objective  Blood pressure 112/72, pulse 63, height 5\' 5"  (1.651 m), SpO2 96%.   General: No apparent distress alert and  oriented x3 mood and affect normal, dressed appropriately.  HEENT: Pupils equal, extraocular movements intact  Respiratory: Patient's speak in full sentences and does not appear short of breath  Cardiovascular: No lower extremity edema, non tender, no erythema  Gait MSK:  Back does have some loss lordosis noted.  Tightness noted in the parascapular area.  Tightness with Pearlean Brownie right greater than left.  Patient does have some mild loss of lordosis of the back noted.  5 out of 5 strength of the lower extremities.  Osteopathic findings  C2 flexed rotated and side bent right T3 extended rotated and side bent right inhaled rib T5 extended rotated and side bent left L1 flexed rotated and side bent right Sacrum right on right     Assessment and Plan:  Low back pain Chronic discomfort noted.  Patient is doing relatively well.  Worsening pain will always consider the possibility of further imaging.  Discussed which activities to do and which ones to avoid otherwise.  Increase activity slowly.  Follow-up again in 6 to 8 weeks.    Nonallopathic problems  Decision today to treat with OMT was based on Physical Exam  After verbal consent patient was treated with HVLA, ME, FPR techniques in cervical, rib, thoracic, lumbar, and sacral  areas  Patient tolerated the procedure well with improvement in symptoms  Patient given exercises, stretches and lifestyle modifications  See medications in patient instructions if given  Patient  will follow up in 4-8 weeks    The above documentation has been reviewed and is accurate and complete Judi Saa, DO          Note: This dictation was prepared with Dragon dictation along with smaller phrase technology. Any transcriptional errors that result from this process are unintentional.

## 2023-09-24 ENCOUNTER — Ambulatory Visit: Payer: Medicare Other | Admitting: Family Medicine

## 2023-09-24 ENCOUNTER — Encounter: Payer: Self-pay | Admitting: Family Medicine

## 2023-09-24 VITALS — BP 112/72 | HR 63 | Ht 65.0 in

## 2023-09-24 DIAGNOSIS — M9908 Segmental and somatic dysfunction of rib cage: Secondary | ICD-10-CM

## 2023-09-24 DIAGNOSIS — M9904 Segmental and somatic dysfunction of sacral region: Secondary | ICD-10-CM | POA: Diagnosis not present

## 2023-09-24 DIAGNOSIS — G8929 Other chronic pain: Secondary | ICD-10-CM

## 2023-09-24 DIAGNOSIS — M545 Low back pain, unspecified: Secondary | ICD-10-CM | POA: Diagnosis not present

## 2023-09-24 DIAGNOSIS — M9902 Segmental and somatic dysfunction of thoracic region: Secondary | ICD-10-CM

## 2023-09-24 DIAGNOSIS — M9901 Segmental and somatic dysfunction of cervical region: Secondary | ICD-10-CM

## 2023-09-24 DIAGNOSIS — M9903 Segmental and somatic dysfunction of lumbar region: Secondary | ICD-10-CM

## 2023-09-24 NOTE — Patient Instructions (Signed)
 Good to see you. Happy 29th. Try not to win Yoga.  Return in 6 weeks.

## 2023-09-24 NOTE — Assessment & Plan Note (Signed)
 Chronic discomfort noted.  Patient is doing relatively well.  Worsening pain will always consider the possibility of further imaging.  Discussed which activities to do and which ones to avoid otherwise.  Increase activity slowly.  Follow-up again in 6 to 8 weeks.

## 2023-09-28 ENCOUNTER — Other Ambulatory Visit (HOSPITAL_BASED_OUTPATIENT_CLINIC_OR_DEPARTMENT_OTHER): Payer: Self-pay

## 2023-09-28 MED ORDER — MONTELUKAST SODIUM 10 MG PO TABS
10.0000 mg | ORAL_TABLET | Freq: Every day | ORAL | 3 refills | Status: AC
  Filled 2023-09-28: qty 90, 90d supply, fill #0
  Filled 2024-01-11: qty 90, 90d supply, fill #1
  Filled 2024-04-17: qty 90, 90d supply, fill #2
  Filled 2024-07-19: qty 90, 90d supply, fill #3

## 2023-10-01 ENCOUNTER — Other Ambulatory Visit (HOSPITAL_BASED_OUTPATIENT_CLINIC_OR_DEPARTMENT_OTHER): Payer: Self-pay

## 2023-10-25 ENCOUNTER — Encounter: Payer: Self-pay | Admitting: Family Medicine

## 2023-10-25 ENCOUNTER — Other Ambulatory Visit: Payer: Self-pay

## 2023-10-25 ENCOUNTER — Ambulatory Visit: Admitting: Family Medicine

## 2023-10-25 VITALS — BP 122/82 | HR 63 | Ht 65.0 in

## 2023-10-25 DIAGNOSIS — M7061 Trochanteric bursitis, right hip: Secondary | ICD-10-CM

## 2023-10-25 DIAGNOSIS — M79644 Pain in right finger(s): Secondary | ICD-10-CM

## 2023-10-25 DIAGNOSIS — M19041 Primary osteoarthritis, right hand: Secondary | ICD-10-CM

## 2023-10-25 DIAGNOSIS — M7062 Trochanteric bursitis, left hip: Secondary | ICD-10-CM

## 2023-10-25 NOTE — Patient Instructions (Signed)
 See you again in 6 weeks Injection in hip and finger today

## 2023-10-25 NOTE — Assessment & Plan Note (Signed)
 Chronic, with exacerbation.  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  Patient has responded well to the injection previously and hopeful that she will have significant improvement again.  Discussed icing regimen and home exercises, discussed which activities to do and which ones to avoid.  Follow-up again in 6 to 8 weeks.

## 2023-10-25 NOTE — Assessment & Plan Note (Signed)
 Has responded well to injections in the past.  [place]aving the [place] and would like to feel better so given her the injection today.  Discussed which activities to do and which ones to avoid.  Increase activity slowly.

## 2023-10-25 NOTE — Progress Notes (Signed)
 [name] [address] [place] [address] Phone: [phone] Subjective:   [name], am serving as a scribe for Dr. Antoine Primas.  I'm seeing this patient by the request  of:  [name], [name], MD  CC: Back pain follow-up  [id number]  [name] is a 68 y.o. female coming in with complaint of back and neck pain.  Have seen patient multiple times in the past for osteopathic manipulation that has been helpful for patient's chronic pains.  Patient is leaving for [employer].  Patient states hip and ring finger.   Medications patient has been prescribed:   Taking:         Reviewed prior external information including notes and imaging from previsou exam, outside providers and external EMR if available.   As well as notes that were available from care everywhere and other healthcare systems.  Past medical history, social, surgical and family history all reviewed in electronic medical record.  No pertanent information unless stated regarding to the chief complaint.   Past Medical History:  Diagnosis Date   Basal cell carcinoma    skin cancer   Colon polyps    Diverticulosis    CT   Fibroids 09/07/2013   IBS (irritable bowel syndrome)    PONV (postoperative nausea and vomiting)    non recent    S/P hysterectomy with oophorectomy 09/07/2013   Seasonal allergies    SVD (spontaneous vaginal delivery)    x 1    Allergies  Allergen Reactions   Molds & Smuts     Other Reaction(s): Not available   Other     Other Reaction(s): Not available     Review of Systems:  No headache, visual changes, nausea, vomiting, diarrhea, constipation, dizziness, abdominal pain, skin rash, fevers, chills, night sweats, weight loss, swollen lymph nodes, body aches, joint swelling, chest pain, shortness of breath, mood changes. POSITIVE muscle aches  Objective  Blood pressure 122/82, pulse 63, height 5\' 5"  (1.651 m), SpO2 97%.    General: No apparent distress alert and oriented x3 mood and affect normal, dressed appropriately.  HEENT: Pupils equal, extraocular movements intact  Respiratory: Patient's speak in full sentences and does not appear short of breath  Cardiovascular: No lower extremity edema, non tender, no erythema  Gait MSK:  Back does have some loss of lordosis noted.  Some tenderness to palpation in the paraspinal musculature.  Some tightness noted with sidebending bilaterally.   Procedure: Real-time Ultrasound Guided Injection of right greater trochanteric bursitis secondary to patient's body habitus Device: GE Logiq Q7 Ultrasound guided injection is preferred based studies that show increased duration, increased effect, greater accuracy, decreased procedural pain, increased response rate, and decreased cost with ultrasound guided versus blind injection.  Verbal informed consent obtained.  Time-out conducted.  Noted no overlying erythema, induration, or other signs of local infection.  Skin prepped in a sterile fashion.  Local anesthesia: Topical Ethyl chloride.  With sterile technique and under real time ultrasound guidance:  Greater trochanteric area was visualized and patient's bursa was noted. A 22-gauge 3 inch needle was inserted and 4 cc of 0.5% Marcaine and 1 cc of Kenalog 40 mg/dL was injected. Pictures taken Completed without difficulty  Pain immediately resolved suggesting accurate placement of the medication.  Advised to call if fevers/chills, erythema, induration, drainage, or persistent bleeding.  Images permanently stored   Impression: Technically successful ultrasound guided injection.   Procedure: Real-time Ultrasound Guided Injection of right fourth  PIP joint Device: GE Logiq Q7 Ultrasound guided injection is preferred based studies that show increased duration, increased effect, greater accuracy, decreased procedural pain, increased response rate, and decreased cost with ultrasound  guided versus blind injection.  Verbal informed consent obtained.  Time-out conducted.  Noted no overlying erythema, induration, or other signs of local infection.  Skin prepped in a sterile fashion.  Local anesthesia: Topical Ethyl chloride.  With sterile technique and under real time ultrasound guidance: With a 25-gauge half inch needle injected with 0.5 cc of 0.5% Marcaine and 0.5 cc of Kenalog 40 mg/mL. Completed without difficulty  Pain immediately resolved suggesting accurate placement of the medication.  Advised to call if fevers/chills, erythema, induration, drainage, or persistent bleeding.  Impression: Technically successful ultrasound guided injection.    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  Patient has responded well to the injection previously and hopeful that she will have significant improvement again.  Discussed icing regimen and home exercises, discussed which activities to do and which ones to avoid.  Follow-up again in 6 to 8 weeks.  Greater trochanteric bursitis of both hips Has responded well to injections in the past.  Leaving the country and would like to feel better so given her the injection today.  Discussed which activities to do and which ones to avoid.  Increase activity slowly.    Nonallopathic problems  Decision today to treat with OMT was based on Physical Exam  After verbal consent patient was treated with HVLA, ME, FPR techniques in cervical, rib, thoracic, lumbar, and sacral  areas  Patient tolerated the procedure well with improvement in symptoms  Patient given exercises, stretches and lifestyle modifications  See medications in patient instructions if given  Patient will follow up in 4-8 weeks    The above documentation has been reviewed and is accurate and complete Judi Saa, DO          Note: This dictation  was prepared with Dragon dictation along with smaller phrase technology. Any transcriptional errors that result from this process are unintentional.

## 2023-11-04 ENCOUNTER — Encounter: Payer: Self-pay | Admitting: Family Medicine

## 2023-11-05 ENCOUNTER — Ambulatory Visit: Payer: Medicare Other | Admitting: Family Medicine

## 2023-11-18 ENCOUNTER — Other Ambulatory Visit (HOSPITAL_BASED_OUTPATIENT_CLINIC_OR_DEPARTMENT_OTHER): Payer: Self-pay

## 2023-12-03 NOTE — Progress Notes (Unsigned)
 [facility] [address] [place] [address] Phone: [phone] Subjective:   [name], am serving as a scribe for Dr. Ronnell Coins.  I'm seeing this patient by the request  of:  [name], [name], MD  CC: Bilateral hip, hand pain, low back pain  [id number]  [name] is a 68 y.o. female coming in with complaint of back and neck pain. OMT 09/24/2023. Saw patient in March for B hip and R hand finger injections. Patient states that finger is doing well. Hip is still bothering her. Injection did give her some respite. Pain over GT. Still able to be active but notices soreness when she gets off the bike.   Medications patient has been prescribed: None  Taking:         Reviewed prior external information including notes and imaging from previsou exam, outside providers and external EMR if available.   As well as notes that were available from care everywhere and other healthcare systems.  Past medical history, social, surgical and family history all reviewed in electronic medical record.  No pertanent information unless stated regarding to the chief complaint.   Past Medical History:  Diagnosis Date   Basal cell carcinoma    skin cancer   Colon polyps    Diverticulosis    CT   Fibroids 09/07/2013   IBS (irritable bowel syndrome)    PONV (postoperative nausea and vomiting)    non recent    S/P hysterectomy with oophorectomy 09/07/2013   Seasonal allergies    SVD (spontaneous vaginal delivery)    x 1    Allergies  Allergen Reactions   Molds & Smuts     Other Reaction(s): Not available   Other     Other Reaction(s): Not available     Review of Systems:  No headache, visual changes, nausea, vomiting, diarrhea, constipation, dizziness, abdominal pain, skin rash, fevers, chills, night sweats, weight loss, swollen lymph nodes, body aches, joint swelling, chest pain, shortness of breath, mood changes. POSITIVE muscle  aches  Objective  Blood pressure 106/78, pulse 66, height 5\' 5"  (1.651 m), SpO2 98%.   General: No apparent distress alert and oriented x3 mood and affect normal, dressed appropriately.  HEENT: Pupils equal, extraocular movements intact  Respiratory: Patient's speak in full sentences and does not appear short of breath  Cardiovascular: No lower extremity edema, non tender, no erythema  Gait relatively normal MSK:  Back significant tightness noted on the hip bilaterally.  Does have significant tenderness in the paraspinal musculature of the lumbar spine right greater than left.  Positive straight leg test noted on the right side at the left.  Seems to be at 20 degrees.  Osteopathic findings  C2 flexed rotated and side bent right C6 flexed rotated and side bent left T3 extended rotated and side bent right inhaled rib T9 extended rotated and side bent left L2 flexed rotated and side bent right Sacrum right on right       Assessment and Plan:  Low back pain Low back pain that is out of proportion.  Has responded well to epidurals previously but last MRI was in 2021.  Having more radicular pain as well as pain that is stopping her from activity.  Severe overall.  Even waking her up at night.  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  Do feel that further imaging would be warranted at this time.  Discussed  icing regimen and home exercises, increase activity slowly.  Follow-up again in 6 to 8 weeks.  Depending on findings of the MRI would see if she would respond again to an epidural.  No longer having to do surveillance for patient's previous abdominal neoplasm in 2015.    Nonallopathic problems  Decision today to treat with OMT was based on Physical Exam  After verbal consent patient was treated with HVLA, ME, FPR techniques in cervical, rib, thoracic, lumbar, and sacral  areas  Patient tolerated the procedure well with improvement in  symptoms  Patient given exercises, stretches and lifestyle modifications  See medications in patient instructions if given  Patient will follow up in 4-8 weeks    The above documentation has been reviewed and is accurate and complete Lysander Calixte M Jermal Dismuke, DO          Note: This dictation was prepared with Dragon dictation along with smaller phrase technology. Any transcriptional errors that result from this process are unintentional.

## 2023-12-06 ENCOUNTER — Ambulatory Visit (INDEPENDENT_AMBULATORY_CARE_PROVIDER_SITE_OTHER): Admitting: Family Medicine

## 2023-12-06 ENCOUNTER — Ambulatory Visit (INDEPENDENT_AMBULATORY_CARE_PROVIDER_SITE_OTHER)

## 2023-12-06 ENCOUNTER — Encounter: Payer: Self-pay | Admitting: Family Medicine

## 2023-12-06 VITALS — BP 106/78 | HR 66 | Ht 65.0 in

## 2023-12-06 DIAGNOSIS — M9904 Segmental and somatic dysfunction of sacral region: Secondary | ICD-10-CM | POA: Diagnosis not present

## 2023-12-06 DIAGNOSIS — M9903 Segmental and somatic dysfunction of lumbar region: Secondary | ICD-10-CM

## 2023-12-06 DIAGNOSIS — M545 Low back pain, unspecified: Secondary | ICD-10-CM

## 2023-12-06 DIAGNOSIS — M9902 Segmental and somatic dysfunction of thoracic region: Secondary | ICD-10-CM | POA: Diagnosis not present

## 2023-12-06 DIAGNOSIS — M9908 Segmental and somatic dysfunction of rib cage: Secondary | ICD-10-CM

## 2023-12-06 DIAGNOSIS — M7062 Trochanteric bursitis, left hip: Secondary | ICD-10-CM

## 2023-12-06 DIAGNOSIS — M25552 Pain in left hip: Secondary | ICD-10-CM | POA: Diagnosis not present

## 2023-12-06 DIAGNOSIS — M9901 Segmental and somatic dysfunction of cervical region: Secondary | ICD-10-CM

## 2023-12-06 DIAGNOSIS — G8929 Other chronic pain: Secondary | ICD-10-CM

## 2023-12-06 DIAGNOSIS — M25551 Pain in right hip: Secondary | ICD-10-CM | POA: Diagnosis not present

## 2023-12-06 DIAGNOSIS — M7061 Trochanteric bursitis, right hip: Secondary | ICD-10-CM

## 2023-12-06 MED ORDER — KETOROLAC TROMETHAMINE 60 MG/2ML IM SOLN
60.0000 mg | Freq: Once | INTRAMUSCULAR | Status: AC
  Administered 2023-12-06: 60 mg via INTRAMUSCULAR

## 2023-12-06 MED ORDER — METHYLPREDNISOLONE ACETATE 80 MG/ML IJ SUSP
80.0000 mg | Freq: Once | INTRAMUSCULAR | Status: AC
  Administered 2023-12-06: 80 mg via INTRAMUSCULAR

## 2023-12-06 NOTE — Assessment & Plan Note (Addendum)
 Low back pain that is out of proportion.  Has responded well to epidurals previously but last MRI was in 2021.  Having more radicular pain as well as pain that is stopping her from activity.  Severe overall.  Even waking her up at night.  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  Do feel that further imaging would be warranted at this time.  Discussed icing regimen and home exercises, increase activity slowly.  Follow-up again in 6 to 8 weeks.  Depending on findings of the MRI would see if she would respond again to an epidural.  No longer having to do surveillance for patient's previous abdominal neoplasm in 2015.

## 2023-12-06 NOTE — Patient Instructions (Signed)
 Great to see you Injections in backside Xray of back and pelvis MRI lumbar and pelvis [phone] See me in 2 months

## 2023-12-06 NOTE — Assessment & Plan Note (Signed)
 Toradol  and Depo-Medrol  given today as well.

## 2023-12-07 ENCOUNTER — Encounter: Payer: Self-pay | Admitting: Family Medicine

## 2023-12-08 ENCOUNTER — Ambulatory Visit
Admission: RE | Admit: 2023-12-08 | Discharge: 2023-12-08 | Disposition: A | Discharge status: Home / Self Care | Source: Ambulatory Visit | Attending: Family Medicine | Admitting: Family Medicine

## 2023-12-08 ENCOUNTER — Other Ambulatory Visit (HOSPITAL_BASED_OUTPATIENT_CLINIC_OR_DEPARTMENT_OTHER): Payer: Self-pay

## 2023-12-08 DIAGNOSIS — M25551 Pain in right hip: Secondary | ICD-10-CM

## 2023-12-08 DIAGNOSIS — M545 Low back pain, unspecified: Secondary | ICD-10-CM

## 2023-12-09 ENCOUNTER — Other Ambulatory Visit (HOSPITAL_BASED_OUTPATIENT_CLINIC_OR_DEPARTMENT_OTHER): Payer: Self-pay

## 2023-12-15 ENCOUNTER — Ambulatory Visit: Payer: Self-pay | Admitting: Family Medicine

## 2023-12-15 DIAGNOSIS — M545 Low back pain, unspecified: Secondary | ICD-10-CM

## 2023-12-28 ENCOUNTER — Other Ambulatory Visit (HOSPITAL_BASED_OUTPATIENT_CLINIC_OR_DEPARTMENT_OTHER): Payer: Self-pay

## 2024-01-10 ENCOUNTER — Telehealth: Payer: Self-pay | Admitting: Family Medicine

## 2024-01-10 NOTE — Telephone Encounter (Signed)
 I have called patient who is wanting a sooner appointment and she wanted to make sure Dr. Felipe Horton wanted to see her before the epidural in the office for what she originally was coming in for for the PRP injection. She says they have been communicating over mychart. Please advise.

## 2024-01-11 ENCOUNTER — Other Ambulatory Visit: Payer: Self-pay

## 2024-01-11 ENCOUNTER — Other Ambulatory Visit (HOSPITAL_BASED_OUTPATIENT_CLINIC_OR_DEPARTMENT_OTHER): Payer: Self-pay

## 2024-01-11 ENCOUNTER — Other Ambulatory Visit: Payer: Self-pay | Admitting: Family Medicine

## 2024-01-11 MED ORDER — MELOXICAM 7.5 MG PO TABS
7.5000 mg | ORAL_TABLET | Freq: Every day | ORAL | 0 refills | Status: DC
  Filled 2024-01-11: qty 90, 90d supply, fill #0

## 2024-01-12 NOTE — Discharge Instructions (Signed)

## 2024-01-13 ENCOUNTER — Ambulatory Visit
Admission: RE | Admit: 2024-01-13 | Discharge: 2024-01-13 | Disposition: A | Discharge status: Home / Self Care | Source: Ambulatory Visit | Attending: Family Medicine | Admitting: Family Medicine

## 2024-01-13 DIAGNOSIS — G8929 Other chronic pain: Secondary | ICD-10-CM

## 2024-01-13 MED ORDER — IOPAMIDOL (ISOVUE-M 200) INJECTION 41%
1.0000 mL | Freq: Once | INTRAMUSCULAR | Status: AC
  Administered 2024-01-13: 1 mL via EPIDURAL

## 2024-01-13 MED ORDER — METHYLPREDNISOLONE ACETATE 40 MG/ML INJ SUSP (RADIOLOG
80.0000 mg | Freq: Once | INTRAMUSCULAR | Status: AC
  Administered 2024-01-13: 80 mg via EPIDURAL

## 2024-01-14 ENCOUNTER — Other Ambulatory Visit (HOSPITAL_BASED_OUTPATIENT_CLINIC_OR_DEPARTMENT_OTHER): Payer: Self-pay

## 2024-01-15 ENCOUNTER — Other Ambulatory Visit (HOSPITAL_BASED_OUTPATIENT_CLINIC_OR_DEPARTMENT_OTHER): Payer: Self-pay

## 2024-01-22 ENCOUNTER — Other Ambulatory Visit (HOSPITAL_BASED_OUTPATIENT_CLINIC_OR_DEPARTMENT_OTHER): Payer: Self-pay

## 2024-01-31 ENCOUNTER — Other Ambulatory Visit (HOSPITAL_BASED_OUTPATIENT_CLINIC_OR_DEPARTMENT_OTHER): Payer: Self-pay

## 2024-02-02 NOTE — Progress Notes (Unsigned)
 [name] [address] [place] [address] Phone: [phone] Subjective:   [name], am serving as a scribe for Dr. Arthea Claudene.  I'm seeing this patient by the request  of:  [name], [name], MD  CC: Low back pain follow-up  YEP:Dlagzrupcz  RHEN DOSSANTOS is a 68 y.o. female coming in with complaint of back and neck pain. OMT on 12/06/2023.  Patient also was to have an epidural and did have that done in the lumbar spine at L3-L4 on June 12.  Patient states that epidural was helpful in addition to the glute tendon pain.   Medications patient has been prescribed: Mobic   Taking:         Reviewed prior external information including notes and imaging from previsou exam, outside providers and external EMR if available.   As well as notes that were available from care everywhere and other healthcare systems.  Past medical history, social, surgical and family history all reviewed in electronic medical record.  No pertanent information unless stated regarding to the chief complaint.   Past Medical History:  Diagnosis Date   Basal cell carcinoma    skin cancer   Colon polyps    Diverticulosis    CT   Fibroids 09/07/2013   IBS (irritable bowel syndrome)    PONV (postoperative nausea and vomiting)    non recent    S/P hysterectomy with oophorectomy 09/07/2013   Seasonal allergies    SVD (spontaneous vaginal delivery)    x 1    Allergies  Allergen Reactions   Molds & Smuts     Other Reaction(s): Not available   Other     Other Reaction(s): Not available     Review of Systems:  No headache, visual changes, nausea, vomiting, diarrhea, constipation, dizziness, abdominal pain, skin rash, fevers, chills, night sweats, weight loss, swollen lymph nodes, body aches, joint swelling, chest pain, shortness of breath, mood changes. POSITIVE muscle aches  Objective  Blood pressure 110/78, height 5' 5 (1.651 m).   General: No  apparent distress alert and oriented x3 mood and affect normal, dressed appropriately.  HEENT: Pupils equal, extraocular movements intact  Respiratory: Patient's speak in full sentences and does not appear short of breath  Cardiovascular: No lower extremity edema, non tender, no erythema  Gait MSK:  Back does have some loss of lordosis noted.  Some tenderness to palpation in the paraspinal musculature.  Very minimal overall compared to patient's usual baseline.  Tightness noted more of the right parascapular area.  Osteopathic findings  C2 flexed rotated and side bent right C7 flexed rotated and side bent left T3 extended rotated and side bent right inhaled rib T7 extended rotated and side bent left L2 flexed rotated and side bent right Sacrum right on right       Assessment and Plan:  Low back pain Outside for pain given radicular pain.  Discussed icing regimen and home exercises, discussed which activities to do much. 1.  Increase activity slowly otherwise.  Next 8 weeks otherwise.  Has been out of treatment but would like follow-up with me again in 6 to 8 weeks.    Nonallopathic problems  Decision today to treat with OMT was based on Physical Exam  After verbal consent patient was treated with HVLA, ME, FPR techniques in cervical, rib, thoracic, lumbar, and sacral  areas  Patient tolerated the procedure well with improvement in symptoms  Patient given exercises, stretches and lifestyle modifications  See medications in patient instructions if given  Patient will follow up in 4-8 weeks    The above documentation has been reviewed and is accurate and complete Trenice Mesa M Cheskel Silverio, DO          Note: This dictation was prepared with Dragon dictation along with smaller phrase technology. Any transcriptional errors that result from this process are unintentional.

## 2024-02-07 ENCOUNTER — Encounter: Payer: Self-pay | Admitting: Family Medicine

## 2024-02-07 ENCOUNTER — Ambulatory Visit (INDEPENDENT_AMBULATORY_CARE_PROVIDER_SITE_OTHER): Admitting: Family Medicine

## 2024-02-07 VITALS — BP 110/78 | Ht 65.0 in

## 2024-02-07 DIAGNOSIS — M9903 Segmental and somatic dysfunction of lumbar region: Secondary | ICD-10-CM | POA: Diagnosis not present

## 2024-02-07 DIAGNOSIS — M545 Low back pain, unspecified: Secondary | ICD-10-CM | POA: Diagnosis not present

## 2024-02-07 DIAGNOSIS — M9904 Segmental and somatic dysfunction of sacral region: Secondary | ICD-10-CM

## 2024-02-07 DIAGNOSIS — M9901 Segmental and somatic dysfunction of cervical region: Secondary | ICD-10-CM | POA: Diagnosis not present

## 2024-02-07 DIAGNOSIS — M9902 Segmental and somatic dysfunction of thoracic region: Secondary | ICD-10-CM | POA: Diagnosis not present

## 2024-02-07 DIAGNOSIS — M9908 Segmental and somatic dysfunction of rib cage: Secondary | ICD-10-CM | POA: Diagnosis not present

## 2024-02-07 DIAGNOSIS — G8929 Other chronic pain: Secondary | ICD-10-CM

## 2024-02-07 NOTE — Assessment & Plan Note (Addendum)
 Outside for pain given radicular pain.  Discussed icing regimen and home exercises, discussed which activities to do much. 1.  Increase activity slowly otherwise.  Next 8 weeks otherwise.  Has been out of treatment but would like follow-up with me again in 6 to 8 weeks.

## 2024-02-07 NOTE — Patient Instructions (Signed)
 Good to see you! Glad the back is better See you again in 2 months

## 2024-03-13 ENCOUNTER — Telehealth: Payer: Self-pay | Admitting: Gastroenterology

## 2024-03-13 ENCOUNTER — Other Ambulatory Visit (HOSPITAL_BASED_OUTPATIENT_CLINIC_OR_DEPARTMENT_OTHER): Payer: Self-pay

## 2024-03-13 ENCOUNTER — Other Ambulatory Visit: Payer: Self-pay

## 2024-03-13 MED ORDER — FLUOROURACIL 5 % EX CREA
TOPICAL_CREAM | CUTANEOUS | 0 refills | Status: AC
  Filled 2024-03-13: qty 40, 30d supply, fill #0

## 2024-03-13 MED ORDER — AMOXICILLIN-POT CLAVULANATE 875-125 MG PO TABS
1.0000 | ORAL_TABLET | Freq: Two times a day (BID) | ORAL | 0 refills | Status: AC
Start: 2024-03-13 — End: 2024-03-20
  Filled 2024-03-13: qty 14, 7d supply, fill #0

## 2024-03-13 NOTE — Telephone Encounter (Signed)
 Please send Rx for Augmentin  875mg  BID X 7 days, schedule f/u with me or APP soon. Thanks

## 2024-03-13 NOTE — Telephone Encounter (Signed)
 Symptoms started Saturday with pain in her abdomen into her rectum. She took sitz bathes thinking maybe she had hemorrhoids. This was comforting but did not alleviate her pain. Sunday she had a good bowel movement but has felt constipated since then. The pain is now in her LLQ and reminds her of when she had diverticulitis. She slept with a heating pad to her abdomen last night. Remains afebrile. NKDA. Uses [facility] Please advise.

## 2024-03-13 NOTE — Telephone Encounter (Signed)
 Patient returning phone call. Patient has been advised of prescription and appointment.

## 2024-03-13 NOTE — Telephone Encounter (Signed)
 Inbound call from patient stating she has been having pain in her LLQ abdomen since Saturday. States pain spreads to rectum. Patient is concerned about diverticulitis. Please advise, thank you

## 2024-03-13 NOTE — Telephone Encounter (Signed)
 Message left for the patient. Prescription sent to pharmacy. Appointment 03/22/24 with Delon Failing, PA.

## 2024-03-17 ENCOUNTER — Ambulatory Visit (INDEPENDENT_AMBULATORY_CARE_PROVIDER_SITE_OTHER): Admitting: Gastroenterology

## 2024-03-17 ENCOUNTER — Encounter: Payer: Self-pay | Admitting: Gastroenterology

## 2024-03-17 ENCOUNTER — Ambulatory Visit (HOSPITAL_BASED_OUTPATIENT_CLINIC_OR_DEPARTMENT_OTHER)
Admission: RE | Admit: 2024-03-17 | Discharge: 2024-03-17 | Disposition: A | Discharge status: Home / Self Care | Source: Ambulatory Visit | Attending: Gastroenterology | Admitting: Gastroenterology

## 2024-03-17 ENCOUNTER — Ambulatory Visit: Payer: Self-pay | Admitting: Gastroenterology

## 2024-03-17 ENCOUNTER — Encounter (HOSPITAL_BASED_OUTPATIENT_CLINIC_OR_DEPARTMENT_OTHER): Payer: Self-pay

## 2024-03-17 ENCOUNTER — Telehealth: Payer: Self-pay | Admitting: Gastroenterology

## 2024-03-17 ENCOUNTER — Other Ambulatory Visit (INDEPENDENT_AMBULATORY_CARE_PROVIDER_SITE_OTHER)

## 2024-03-17 VITALS — BP 122/60 | HR 96 | Ht 64.5 in | Wt 155.1 lb

## 2024-03-17 DIAGNOSIS — K6289 Other specified diseases of anus and rectum: Secondary | ICD-10-CM | POA: Insufficient documentation

## 2024-03-17 DIAGNOSIS — K602 Anal fissure, unspecified: Secondary | ICD-10-CM

## 2024-03-17 DIAGNOSIS — R110 Nausea: Secondary | ICD-10-CM | POA: Diagnosis not present

## 2024-03-17 DIAGNOSIS — R1032 Left lower quadrant pain: Secondary | ICD-10-CM | POA: Diagnosis present

## 2024-03-17 DIAGNOSIS — K5900 Constipation, unspecified: Secondary | ICD-10-CM

## 2024-03-17 LAB — CREATININE, SERUM: Creatinine, Ser: 0.89 mg/dL (ref 0.40–1.20)

## 2024-03-17 LAB — BUN: BUN: 11 mg/dL (ref 6–23)

## 2024-03-17 MED ORDER — IOHEXOL 300 MG/ML  SOLN
100.0000 mL | Freq: Once | INTRAMUSCULAR | Status: AC | PRN
  Administered 2024-03-17: 100 mL via INTRAVENOUS

## 2024-03-17 NOTE — Telephone Encounter (Signed)
 Inbound call from patient stating she has been put on antibiotics but continues to feel the same and sometimes even worse. Patient stated she is experiencing severe lower stomach pains and the pain goes down to her rectum. Advised patient we do not do same day appointments but if she continues to feel this pain to visit the ER.   Please advise  Thank you

## 2024-03-17 NOTE — Telephone Encounter (Signed)
 Patient was started on Augmentin  on 03/13/2024 for abd pain that radiated to her rectum. Patient states she has been taking the abts, however late Wed the pain started to get worse. She reports sharp, intermittent pain that is worse in her rectum that radiates to her LLQ abd. She reports that she has tried sitting up in a tub of hot water , and she has been using a heating pad but it is not helping the pain. Was able to schedule patient an appointment for this afternoon at 1330 with Dr. Shila.

## 2024-03-17 NOTE — Patient Instructions (Addendum)
 VISIT SUMMARY:  During your visit, we discussed your abdominal pain and constipation, which are likely related to an acute anal fissure. We also addressed your nausea and provided a treatment plan to help manage your symptoms and promote healing.  YOUR PLAN: LLQ abd pain: Obtain stat CT abd & pelvis to exclude complications from diverticulitis, continue antibiotics STAT CT   You have been scheduled for a CT scan of the abdomen and pelvis at [facility], 1st floor Radiology. You are scheduled on Today 8/15 at 3 pm to drink contrast and your CT will be at 5:00 pm .  [facility]  [address] [place], [place] [address]   ANAL FISSURE: You have an acute anal fissure likely caused by recent activities such as long bike rides and eating popcorn. This is causing significant pain and discomfort. -Apply nitroglycerin ointment, compounded, in small amounts to the anal area 3-4 times daily. -Mix and apply Recticare (numbing cream) with nitroglycerin. -Keep the rectal area dry and avoid moisture buildup. -Avoid long bike rides until symptoms improve.  CONSTIPATION ASSOCIATED WITH ANAL FISSURE: Your constipation is likely due to the pain from the anal fissure, making you reluctant to pass stool. -Take a gentle stool softener nightly. -Continue using Benefiber. -Increase your water  intake.  NAUSEA: Your nausea is likely related to the discomfort and pain from the anal fissure.  Your provider has requested that you go to the basement level for lab work before leaving today. Press B on the elevator. The lab is located at the first door on the left as you exit the elevator.   Due to recent changes in healthcare laws, you may see the results of your imaging and laboratory studies on MyChart before your provider has had a chance to review them.  We understand that in some cases there may be results that are confusing or concerning to you. Not all laboratory results come  back in the same time frame and the provider may be waiting for multiple results in order to interpret others.  Please give us  48 hours in order for your provider to thoroughly review all the results before contacting the office for clarification of your results.    I appreciate the  opportunity to care for you  Thank You   Kavitha Nandigam , MD

## 2024-03-17 NOTE — Telephone Encounter (Signed)
 Patient is returning a call back to [place]. Please advise.

## 2024-03-17 NOTE — Progress Notes (Signed)
 ALDONIA [name]    [id number]    1956-06-01  Primary Care Physician:[name], Searcy, MD  Referring Physician: [name], [name], MD [address] [place],  [place] [address]   Chief complaint:  Rectal pain  Discussed the use of AI scribe software for clinical note transcription with the patient, who gave verbal consent to proceed.  History of Present Illness [name] is a 68 year old female with diverticulitis who presents with abdominal pain and constipation.  Abdominal pain - Onset after getting off her bike on Saturday - Initially mild, attributed to diverticulitis - Pain became more pronounced May 06, 2024 night and intensified on Thursday - Described as sharp and spasming, particularly in the rectal area - Pain similar to previous diverticulitis episodes but with more intense spasms - No associated fever or chills - Pain partially alleviated by heating pad and hot baths  Altered bowel habits - Constipation present since symptom onset - Only small amounts of stool passed - No rectal bleeding - Using Benefiber to aid bowel movements  Gastrointestinal symptoms - Nausea present - No vomiting - Decreased appetite  Antibiotic use - Started amoxicillin  on Monday for current symptoms - Dosage: two pills per day - Has taken eight out of fourteen pills - No amoxicillin  taken today  Potential dietary trigger - Ate popcorn at the movies with her grandchildren prior to symptom onset, believes this may have triggered current symptoms  Physical activity - Actively training for a long bike ride, typically riding 30 to 35 miles - Plans for a 67-mile ride  GI Hx: MR Abdomen w wo contrast 03-19-23 1. Status post distal pancreatectomy. No pancreatic ductal dilatation or surrounding inflammatory changes. Specifically, distal pancreatic ductal dilatation reported on prior examination is not appreciated on today's examination. 2. Varices throughout the left upper  quadrant, secondary to resection of the splenic vein.   MR Abdomen w wo contrast 03-06-22 1. 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 New minimal dilatation (3 mm diameter) of the main pancreatic duct in the pancreatic body near the pancreatectomy margin. Suggest continued MRI abdomen surveillance in 12 months. 2. No evidence of metastatic disease in the abdomen.   Colonoscopy 12-05-19 - The examined portion of the ileum was normal.  - One 3 mm polyp in the cecum, removed with a cold snare. Resected and retrieved.  - MILD Diverticulosis at the hepatic flexure.  - MODERATE Diverticulosis in the recto-sigmoid colon and in the sigmoid colon.  - HEME POSITIVE STOOLS/INTERMITTENT RECTAL BLEEDING DUE TO and internal hemorrhoids.  - Tortuous LEFT colon. A. COLON, CECAL POLYPECTOMY:  -  Tubular adenoma (1 of 1 fragments)  -  No high grade dysplasia or malignancy identified    Colonoscopy 01-04-17 - There was significant looping of the rectosigmoid colon.  - One 4 mm polyp in the rectum, removed with a cold biopsy forceps. Resected and retrieved.  - Internal hemorrhoids. Rectum, polyp(s) - HYPERPLASTIC POLYP. - NO DYSPLASIA OR MALIGNANCY.   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 Harvey   3 mm tubular adenoma removed from cecum, diverticulosis and internal hemorrhoids   Family history of colon cancer in mother   CT abdomen pelvis with contrast January 2021: Negative for  any acute pathology, no new pancreatic cysts or lesions.   Outpatient Encounter Medications as of 03/17/2024  Medication Sig   AMBULATORY NON FORMULARY MEDICATION Medication Name: Use 1 pea sized amount Nitroglycerin 0.125% Three times a day 4-6 weeks   amoxicillin -clavulanate (AUGMENTIN )  875-125 MG tablet Take 1 tablet by mouth 2 (two) times daily for 7 days.   ascorbic acid  (VITAMIN C ) 100 MG tablet Take 100 mg by mouth daily.   b complex vitamins tablet Take 1 tablet by mouth daily.   Calcium Carb-Cholecalciferol 600-800 MG-UNIT TABS Take 1 tablet by mouth daily.   dimenhyDRINATE (DRAMAMINE) 50 MG tablet Take 50 mg by mouth in the morning and at bedtime.   ELDERBERRY PO Take 1 tablet by mouth daily.   estradiol  (VIVELLE -DOT) 0.025 MG/24HR Place 1 patch onto the skin 2 (two) times a week.   fexofenadine (ALLEGRA) 180 MG tablet Take 180 mg by mouth daily as needed for allergies or rhinitis.   fluorouracil  (EFUDEX ) 5 % cream Apply a small amount to skin every night X2 weeks for nose   fluticasone  (FLONASE ) 50 MCG/ACT nasal spray 1 spray in each nostril Nasally Once a day or prn 30 day(s)   ipratropium (ATROVENT ) 0.06 % nasal spray INSTILL 1 TO 2 SPRAYS IN EACH NOSTRIL 3-4 TIMES A DAY AS NEEDED   MELATONIN PO Take 1 tablet by mouth at bedtime.   meloxicam  (MOBIC ) 7.5 MG tablet Take 1 tablet (7.5 mg total) by mouth daily.   montelukast  (SINGULAIR ) 10 MG tablet Take 1 tablet by mouth once daily   Multiple Vitamin (MULTIVITAMIN) tablet Take 1 tablet by mouth daily.   Omega-3 Fatty Acids (FISH OIL PO) Take 1 capsule by mouth daily.   RSV vaccine recomb adjuvanted (AREXVY ) 120 MCG/0.5ML injection as directed Intramuscular once   tretinoin  (RETIN-A ) 0.05 % cream Apply 1 application on the skin NIGHTLY   Wheat Dextrin (BENEFIBER PO) Take 1 packet by mouth daily.   [DISCONTINUED] Diclofenac  Sodium 2 % SOLN Place 2 g onto the skin 2 (two) times daily.   [DISCONTINUED] Lactobacillus (ACIDOPHILUS PO) Take 1 capsule by mouth daily.   [DISCONTINUED] levocetirizine (XYZAL ) 5 MG tablet Take 1 tablet (5 mg total) by mouth daily in the evening   [DISCONTINUED] RESTASIS  0.05 % ophthalmic emulsion Place 1 drop into both eyes 2 (two) times daily.   No facility-administered encounter medications on  file as of 03/17/2024.    Allergies as of 03/17/2024 - Review Complete 03/17/2024  Allergen Reaction Noted   Molds & smuts  05/03/2023    Past Medical History:  Diagnosis Date   Basal cell carcinoma    skin cancer   Colon polyps    Diverticulosis    CT   Fibroids 09/07/2013   IBS (irritable bowel syndrome)    PONV (postoperative nausea and vomiting)    non recent    S/P hysterectomy with oophorectomy 09/07/2013   Seasonal allergies    SVD (spontaneous vaginal delivery)    x 1    Past Surgical History:  Procedure Laterality Date   ABDOMINAL HYSTERECTOMY     COLONOSCOPY     2 or 3   COLONOSCOPY N/A 01/04/2017   Dr. harvey: Significant looping of the rectosigmoid colon, 4 mm hyperplastic polyp removed from rectum, internal hemorrhoids.  Next colonoscopy in 5 to 10 years with MAC and ultra slim colonoscope.   COLONOSCOPY WITH PROPOFOL  N/A 12/05/2019   Procedure: COLONOSCOPY WITH PROPOFOL ;  Surgeon: harvey Margo CROME, MD;  Location: [facility]  END[facility] SU[facility];  Service: Endoscopy;  Laterality: N/A;  7:30am   DILATION AND CURETTAGE OF UTERUS     x 3   EUS N/A 11/16/2013   Dr. Teressa: parenchyma in tail of pancreas suggests chronic pancreatitis, abrupt changes in main pancreatic duct in tail becomes dilated up to 4-25mm, numerous associated dilated side branches, no solid mass. bx most c/w IPMN   EYE SURGERY     lasik bilateral   LAPAROSCOPY N/A 09/25/2013   Procedure: LAPAROSCOPY OPERATIVE;  Surgeon: Robbi JONELLE Render, MD;  Location: [facility];  Service: Gynecology;  Laterality: N/A;   PANCREATECTOMY N/A 12/07/2013   Procedure: LAPAROSCOPIC DISTAL  PANCREATECTOMY;  Surgeon: Jina Nephew, MD;  Location: [facility] OR;  Service: General;  Laterality: N/A;   POLYPECTOMY  01/04/2017   Procedure: POLYPECTOMY;  Surgeon: Harvey Margo CROME, MD;  Location: [facility];  Service: Endoscopy;;  rectal   POLYPECTOMY  12/05/2019   Procedure: POLYPECTOMY;  Surgeon: Harvey Margo CROME, MD;  Location: [facility];  Service:  Endoscopy;;   ROBOTIC ASSISTED TOTAL HYSTERECTOMY WITH BILATERAL SALPINGO OOPHERECTOMY Bilateral 09/07/2013   Procedure: ROBOTIC ASSISTED TOTAL HYSTERECTOMY WITH BILATERAL SALPINGO OOPHORECTOMY;  Surgeon: Robbi JONELLE Render, MD;  Location: [facility];  Service: Gynecology;  Laterality: Bilateral;   WISDOM TOOTH EXTRACTION      Family History  Problem Relation Age of Onset   COPD Mother    Colon cancer Mother    Lung cancer Mother 13   Diabetes Brother    Diverticulitis Brother    Heart disease Maternal Grandfather    Stomach cancer Neg Hx    Esophageal cancer Neg Hx    Pancreatic cancer Neg Hx     Social History   Socioeconomic History   Marital status: Married    Spouse name: Not on file   Number of children: 1   Years of education: Not on file   Highest education level: Not on file  Occupational History   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[employer]    Comment: Production designer, theatre/television/film, VP Materials engineer  Tobacco Use   Smoking status: Former    Current packs/day: 0.00    Average packs/day: 1 pack/day for 10.0 years (10.0 ttl pk-yrs)    Types: Cigarettes    Start date: 01/01/1977    Quit date: 01/02/1987    Years since quitting: 37.2   Smokeless tobacco: Never  Vaping Use   Vaping status: Never Used  Substance and Sexual Activity   Alcohol  use: Yes    Comment: 2-3 glases of wine on weekend nights   Drug use: No   Sexual activity: Not Currently    Birth control/protection: Post-menopausal, Surgical  Other Topics Concern   Not on file  Social History Narrative   Not on file   Social Drivers of Health   Financial Resource Strain: Not on file  Food Insecurity: Not on file  Transportation Needs: Not on file  Physical Activity: Not on file  Stress: Not on file  Social Connections: Not on file  Intimate Partner Violence: Not on file      Review of systems: All other review of systems negative except as mentioned in the HPI.   Physical Exam: Vitals:   03/17/24 1330   BP: 122/60  Pulse: 96   Body mass index is 26.22 kg/m. Gen:      No acute distress HEENT:  sclera anicteric Abd:      soft, non-tender; no palpable masses, no distension Ext:  No edema Neuro: alert and oriented x 3 Psych: normal mood and affect Rectal exam: Anorectal discomfort, normal anal sphincter tone, posterior anal fissure Anoscopy: Not performed due to increased anal discomfort  Data Reviewed:  Reviewed labs, radiology imaging, old records and pertinent past GI work up     Assessment and Plan Assessment & Plan Anal fissure Acute anal fissure likely triggered by recent activities such as long bike rides and consumption of popcorn. Located on the right side of the rectum, causing significant pain and discomfort. Anoscopy avoided to prevent further pain. Healing may take up to two months, but improvement expected within a few days. - Prescribe nitroglycerin ointment 0.125%, compounded, to be applied in small amounts to the anal area 3-4 times daily. - Prescribe Recticare (numbing cream) to be mixed with nitroglycerin for application. - Advise to keep the rectal area dry and avoid moisture buildup. - Recommend avoiding long bike rides until symptoms improve.  Constipation associated with anal fissure Constipation likely secondary to anal fissure causing pain and reluctance to pass stool. Examination revealed backed-up stool in the colon. Decision to use gentle stool softeners to avoid irritating the fissure. - Recommend stool softener docusate 1 capsule to be taken nightly. - Continue Benefiber supplementation. - Encourage increased water  intake.  Nausea Nausea likely related to discomfort and pain from the anal fissure. No vomiting reported.  LLQ abd pain: Worsening left lower quadrant abdominal pain, history of diverticulitis, she was empirically started on antibiotics with no improvement. Obtain stat CT abd & pelvis to exclude complications from diverticulitis, she is  currently on Abx Day 4/7    This visit required >40 minutes of patient care (this includes precharting, chart review, review of results, face-to-face time used for counseling as well as treatment plan and follow-up. The patient was provided an opportunity to ask questions and all were answered. The patient agreed with the plan and demonstrated an understanding of the instructions.  LOIS Wilkie Mcgee , MD    CC: [name], [name], MD

## 2024-03-17 NOTE — Telephone Encounter (Signed)
 Attempted to reach patient. No answer, left VM for patient to return call.

## 2024-03-20 ENCOUNTER — Other Ambulatory Visit (HOSPITAL_BASED_OUTPATIENT_CLINIC_OR_DEPARTMENT_OTHER): Payer: Self-pay

## 2024-03-20 ENCOUNTER — Other Ambulatory Visit: Payer: Self-pay

## 2024-03-20 MED ORDER — AMOXICILLIN-POT CLAVULANATE 875-125 MG PO TABS
1.0000 | ORAL_TABLET | Freq: Two times a day (BID) | ORAL | 0 refills | Status: AC
  Filled 2024-03-20: qty 14, 7d supply, fill #0

## 2024-03-21 ENCOUNTER — Other Ambulatory Visit (HOSPITAL_BASED_OUTPATIENT_CLINIC_OR_DEPARTMENT_OTHER): Payer: Self-pay

## 2024-03-22 ENCOUNTER — Other Ambulatory Visit (HOSPITAL_BASED_OUTPATIENT_CLINIC_OR_DEPARTMENT_OTHER): Payer: Self-pay

## 2024-03-22 ENCOUNTER — Ambulatory Visit: Admitting: Physician Assistant

## 2024-03-23 NOTE — Telephone Encounter (Signed)
 Called the patient to check on her progress and get an update of her symptoms. No answer. Left a message of my call.

## 2024-03-24 ENCOUNTER — Encounter: Payer: Self-pay | Admitting: Gastroenterology

## 2024-04-12 ENCOUNTER — Other Ambulatory Visit (HOSPITAL_BASED_OUTPATIENT_CLINIC_OR_DEPARTMENT_OTHER): Payer: Self-pay

## 2024-04-12 MED ORDER — CYCLOSPORINE 0.05 % OP EMUL
1.0000 [drp] | Freq: Two times a day (BID) | OPHTHALMIC | 4 refills | Status: AC
  Filled 2024-04-12: qty 180, 90d supply, fill #0

## 2024-04-14 ENCOUNTER — Other Ambulatory Visit (HOSPITAL_BASED_OUTPATIENT_CLINIC_OR_DEPARTMENT_OTHER): Payer: Self-pay

## 2024-04-19 ENCOUNTER — Other Ambulatory Visit (HOSPITAL_BASED_OUTPATIENT_CLINIC_OR_DEPARTMENT_OTHER): Payer: Self-pay

## 2024-04-19 ENCOUNTER — Other Ambulatory Visit: Payer: Self-pay | Admitting: Internal Medicine

## 2024-04-19 DIAGNOSIS — Z0000 Encounter for general adult medical examination without abnormal findings: Secondary | ICD-10-CM

## 2024-04-19 MED ORDER — FLUCONAZOLE 150 MG PO TABS
150.0000 mg | ORAL_TABLET | Freq: Once | ORAL | 0 refills | Status: AC
  Filled 2024-04-19: qty 1, 1d supply, fill #0

## 2024-04-21 ENCOUNTER — Ambulatory Visit
Admission: RE | Admit: 2024-04-21 | Discharge: 2024-04-21 | Disposition: A | Discharge status: Home / Self Care | Source: Ambulatory Visit | Attending: Internal Medicine | Admitting: Internal Medicine

## 2024-04-21 DIAGNOSIS — Z0000 Encounter for general adult medical examination without abnormal findings: Secondary | ICD-10-CM

## 2024-05-04 NOTE — Progress Notes (Unsigned)
 [name] [address] [place] [address] Phone: [phone] Subjective:   [name], am serving as a scribe for Dr. Arthea Claudene.  I'm seeing this patient by the request  of:  [name], [name], MD  CC: Neck pain, hand pain, knee pain  YEP:Dlagzrupcz  [name] is a 68 y.o. female coming in with complaint of back and neck pain. OMT on 02/07/2024. Patient states R hand ring finger has been swollen and painful for past 3 weeks.   L knee pain over lateral aspect. Painful to walk uphill. Wears brace when working out.   Medications patient has been prescribed:   Taking:         Reviewed prior external information including notes and imaging from previsou exam, outside providers and external EMR if available.   As well as notes that were available from care everywhere and other healthcare systems.  Past medical history, social, surgical and family history all reviewed in electronic medical record.  No pertanent information unless stated regarding to the chief complaint.   Past Medical History:  Diagnosis Date   Basal cell carcinoma    skin cancer   Colon polyps    Diverticulosis    CT   Fibroids 09/07/2013   IBS (irritable bowel syndrome)    PONV (postoperative nausea and vomiting)    non recent    S/P hysterectomy with oophorectomy 09/07/2013   Seasonal allergies    SVD (spontaneous vaginal delivery)    x 1    Allergies  Allergen Reactions   Molds & Smuts     Other Reaction(s): Not available     Review of Systems:  No headache, visual changes, nausea, vomiting, diarrhea, constipation, dizziness, abdominal pain, skin rash, fevers, chills, night sweats, weight loss, swollen lymph nodes, body aches, joint swelling, chest pain, shortness of breath, mood changes. POSITIVE muscle aches  Objective  Blood pressure 108/74, pulse 63, height 5' 4.5 (1.638 m), SpO2 97%.   General: No apparent distress alert and oriented x3  mood and affect normal, dressed appropriately.  HEENT: Pupils equal, extraocular movements intact  Respiratory: Patient's speak in full sentences and does not appear short of breath  Cardiovascular: No lower extremity edema, non tender, no erythema  Gait normal MSK:  Back does have some mild loss of lordosis. Right hand does have tenderness to palpation over the right ring finger at the PIP.  Swelling noted.  Some limited range of motion.  Procedure: Real-time Ultrasound Guided Injection of right PIP of ring finger Device: GE Logiq Q7 Ultrasound guided injection is preferred based studies that show increased duration, increased effect, greater accuracy, decreased procedural pain, increased response rate, and decreased cost with ultrasound guided versus blind injection.  Verbal informed consent obtained.  Time-out conducted.  Noted no overlying erythema, induration, or other signs of local infection.  Skin prepped in a sterile fashion.  Local anesthesia: Topical Ethyl chloride.  With sterile technique and under real time ultrasound guidance: With a 25-gauge half inch needle injected with 0.5 cc of 0.5% Marcaine  and 0.5 cc of Kenalog  40 mg/mL Completed without difficulty  Pain immediately resolved suggesting accurate placement of the medication.  Advised to call if fevers/chills, erythema, induration, drainage, or persistent bleeding.  Impression: Technically successful ultrasound guided injection.        Assessment and Plan:  Arthritis of finger of right hand Repeat injection given again today, tolerated the procedure well, discussed icing regimen and home exercises, discussed  which activities to do and which ones to avoid.  Patient did have more of a questionable seroma in the area underneath the skin and above the subcutaneous tissue where we did attempt injection.  Hopeful that this will make improvement.  No sign of any infectious etiology hopeful that this will make significant  difference for some time.  Follow-up again in 3 months  Greater trochanteric bursitis of both hips Stable at the moment.         The above documentation has been reviewed and is accurate and complete Sabrina Wiseman M Ranny Wiebelhaus, DO         Note: This dictation was prepared with Dragon dictation along with smaller phrase technology. Any transcriptional errors that result from this process are unintentional.

## 2024-05-08 ENCOUNTER — Other Ambulatory Visit: Payer: Self-pay

## 2024-05-08 ENCOUNTER — Ambulatory Visit (INDEPENDENT_AMBULATORY_CARE_PROVIDER_SITE_OTHER): Admitting: Family Medicine

## 2024-05-08 VITALS — BP 108/74 | HR 63 | Ht 64.5 in

## 2024-05-08 DIAGNOSIS — M7061 Trochanteric bursitis, right hip: Secondary | ICD-10-CM | POA: Diagnosis not present

## 2024-05-08 DIAGNOSIS — M79642 Pain in left hand: Secondary | ICD-10-CM | POA: Diagnosis not present

## 2024-05-08 DIAGNOSIS — M79641 Pain in right hand: Secondary | ICD-10-CM

## 2024-05-08 DIAGNOSIS — M19041 Primary osteoarthritis, right hand: Secondary | ICD-10-CM | POA: Diagnosis not present

## 2024-05-08 DIAGNOSIS — M79644 Pain in right finger(s): Secondary | ICD-10-CM

## 2024-05-08 DIAGNOSIS — M7062 Trochanteric bursitis, left hip: Secondary | ICD-10-CM

## 2024-05-09 ENCOUNTER — Encounter: Payer: Self-pay | Admitting: Family Medicine

## 2024-05-09 NOTE — Assessment & Plan Note (Signed)
 Repeat injection given again today, tolerated the procedure well, discussed icing regimen and home exercises, discussed which activities to do and which ones to avoid.  Patient did have more of a questionable seroma in the area underneath the skin and above the subcutaneous tissue where we did attempt injection.  Hopeful that this will make improvement.  No sign of any infectious etiology hopeful that this will make significant difference for some time.  Follow-up again in 3 months

## 2024-05-09 NOTE — Assessment & Plan Note (Signed)
Stable at the moment. 

## 2024-05-19 ENCOUNTER — Other Ambulatory Visit (HOSPITAL_BASED_OUTPATIENT_CLINIC_OR_DEPARTMENT_OTHER): Payer: Self-pay

## 2024-05-20 ENCOUNTER — Other Ambulatory Visit (HOSPITAL_BASED_OUTPATIENT_CLINIC_OR_DEPARTMENT_OTHER): Payer: Self-pay

## 2024-05-20 MED ORDER — COMIRNATY 30 MCG/0.3ML IM SUSY
0.3000 mL | PREFILLED_SYRINGE | Freq: Once | INTRAMUSCULAR | 0 refills | Status: AC
  Filled 2024-05-20: qty 0.3, 1d supply, fill #0

## 2024-05-22 ENCOUNTER — Other Ambulatory Visit: Payer: Self-pay | Admitting: Family Medicine

## 2024-05-23 ENCOUNTER — Other Ambulatory Visit (HOSPITAL_BASED_OUTPATIENT_CLINIC_OR_DEPARTMENT_OTHER): Payer: Self-pay

## 2024-05-23 MED ORDER — MELOXICAM 7.5 MG PO TABS
7.5000 mg | ORAL_TABLET | Freq: Every day | ORAL | 0 refills | Status: AC
  Filled 2024-05-23: qty 90, 90d supply, fill #0

## 2024-06-15 ENCOUNTER — Other Ambulatory Visit (HOSPITAL_BASED_OUTPATIENT_CLINIC_OR_DEPARTMENT_OTHER): Payer: Self-pay

## 2024-06-15 MED ORDER — ESTRADIOL 0.025 MG/24HR TD PTTW
1.0000 | MEDICATED_PATCH | TRANSDERMAL | 3 refills | Status: AC
  Filled 2024-06-15 – 2024-07-01 (×2): qty 24, 84d supply, fill #0

## 2024-06-15 MED ORDER — FLUCONAZOLE 150 MG PO TABS
150.0000 mg | ORAL_TABLET | Freq: Every day | ORAL | 0 refills | Status: AC
  Filled 2024-06-15: qty 3, 3d supply, fill #0

## 2024-06-27 ENCOUNTER — Other Ambulatory Visit (HOSPITAL_BASED_OUTPATIENT_CLINIC_OR_DEPARTMENT_OTHER): Payer: Self-pay

## 2024-07-01 ENCOUNTER — Other Ambulatory Visit (HOSPITAL_BASED_OUTPATIENT_CLINIC_OR_DEPARTMENT_OTHER): Payer: Self-pay

## 2024-07-11 NOTE — Progress Notes (Unsigned)
 [name] [address] [place] [address] Phone: [phone] Subjective:    I'm seeing this patient by the request  of:  [name], [name], MD  CC:   YEP:Dlagzrupcz  05/08/2024 Stable at the moment.     Repeat injection given again today, tolerated the procedure well, discussed icing regimen and home exercises, discussed which activities to do and which ones to avoid.  Patient did have more of a questionable seroma in the area underneath the skin and above the subcutaneous tissue where we did attempt injection.  Hopeful that this will make improvement.  No sign of any infectious etiology hopeful that this will make significant difference for some time.  Follow-up again in 3 months   Updated 07/13/2024 [name] is a 68 y.o. female coming in with complaint of hand and hip pain       Past Medical History:  Diagnosis Date   Basal cell carcinoma    skin cancer   Colon polyps    Diverticulosis    CT   Fibroids 09/07/2013   IBS (irritable bowel syndrome)    PONV (postoperative nausea and vomiting)    non recent    S/P hysterectomy with oophorectomy 09/07/2013   Seasonal allergies    SVD (spontaneous vaginal delivery)    x 1   Past Surgical History:  Procedure Laterality Date   ABDOMINAL HYSTERECTOMY     COLONOSCOPY     2 or 3   COLONOSCOPY N/A 01/04/2017   Dr. harvey: Significant looping of the rectosigmoid colon, 4 mm hyperplastic polyp removed from rectum, internal hemorrhoids.  Next colonoscopy in 5 to 10 years with MAC and ultra slim colonoscope.   COLONOSCOPY WITH PROPOFOL  N/A 12/05/2019   Procedure: COLONOSCOPY WITH PROPOFOL ;  Surgeon: Harvey Margo CROME, MD;  Location: [facility];  Service: Endoscopy;  Laterality: N/A;  7:30am   DILATION AND CURETTAGE OF UTERUS     x 3   EUS N/A 11/16/2013   Dr. Teressa: parenchyma in tail of pancreas suggests chronic pancreatitis, abrupt changes in main pancreatic duct in tail becomes dilated  up to 4-33mm, numerous associated dilated side branches, no solid mass. bx most c/w IPMN   EYE SURGERY     lasik bilateral   LAPAROSCOPY N/A 09/25/2013   Procedure: LAPAROSCOPY OPERATIVE;  Surgeon: Robbi JONELLE Render, MD;  Location: [facility];  Service: Gynecology;  Laterality: N/A;   PANCREATECTOMY N/A 12/07/2013   Procedure: LAPAROSCOPIC DISTAL  PANCREATECTOMY;  Surgeon: Jina Nephew, MD;  Location: [facility];  Service: General;  Laterality: N/A;   POLYPECTOMY  01/04/2017   Procedure: POLYPECTOMY;  Surgeon: Harvey Margo CROME, MD;  Location: [facility];  Service: Endoscopy;;  rectal   POLYPECTOMY  12/05/2019   Procedure: POLYPECTOMY;  Surgeon: Harvey Margo CROME, MD;  Location: [facility];  Service: Endoscopy;;   ROBOTIC ASSISTED TOTAL HYSTERECTOMY WITH BILATERAL SALPINGO OOPHERECTOMY Bilateral 09/07/2013   Procedure: ROBOTIC ASSISTED TOTAL HYSTERECTOMY WITH BILATERAL SALPINGO OOPHORECTOMY;  Surgeon: Robbi JONELLE Render, MD;  Location: [facility];  Service: Gynecology;  Laterality: Bilateral;   WISDOM TOOTH EXTRACTION     Social History   Socioeconomic History   Marital status: Married    Spouse name: Not on file   Number of children: 1   Years of education: Not on file   Highest education level: Not on file  Occupational History   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[employer]    Comment: [employer], VP  Materials Engineer  Tobacco Use   Smoking status: Former    Current packs/day: 0.00    Average packs/day: 1 pack/day for 10.0 years (10.0 ttl pk-yrs)    Types: Cigarettes    Start date: 01/01/1977    Quit date: 01/02/1987    Years since quitting: 37.5   Smokeless tobacco: Never  Vaping Use   Vaping status: Never Used  Substance and Sexual Activity   Alcohol  use: Yes    Comment: 2-3 glases of wine on weekend nights   Drug use: No   Sexual activity: Not Currently    Birth control/protection: Post-menopausal, Surgical  Other Topics Concern   Not on file  Social History Narrative   Not on file    Social Drivers of Health   Financial Resource Strain: Not on file  Food Insecurity: Not on file  Transportation Needs: Not on file  Physical Activity: Not on file  Stress: Not on file  Social Connections: Not on file   Allergies  Allergen Reactions   Molds & Smuts     Other Reaction(s): Not available   Family History  Problem Relation Age of Onset   COPD Mother    Colon cancer Mother    Lung cancer Mother 23   Diabetes Brother    Diverticulitis Brother    Heart disease Maternal Grandfather    Stomach cancer Neg Hx    Esophageal cancer Neg Hx    Pancreatic cancer Neg Hx     Current Outpatient Medications (Endocrine & Metabolic):    estradiol  (VIVELLE -DOT) 0.025 MG/24HR, Place 1 patch onto the skin 2 (two) times a week.   Current Outpatient Medications (Respiratory):    fexofenadine (ALLEGRA) 180 MG tablet, Take 180 mg by mouth daily as needed for allergies or rhinitis.   fluticasone  (FLONASE ) 50 MCG/ACT nasal spray, 1 spray in each nostril Nasally Once a day or prn 30 day(s)   ipratropium (ATROVENT ) 0.06 % nasal spray, INSTILL 1 TO 2 SPRAYS IN EACH NOSTRIL 3-4 TIMES A DAY AS NEEDED   montelukast  (SINGULAIR ) 10 MG tablet, Take 1 tablet by mouth once daily  Current Outpatient Medications (Analgesics):    meloxicam  (MOBIC ) 7.5 MG tablet, Take 1 tablet (7.5 mg total) by mouth daily.   Current Outpatient Medications (Other):    AMBULATORY NON FORMULARY MEDICATION, Medication Name: Use 1 pea sized amount Nitroglycerin 0.125% Three times a day 4-6 weeks   ascorbic acid  (VITAMIN C ) 100 MG tablet, Take 100 mg by mouth daily.   b complex vitamins tablet, Take 1 tablet by mouth daily.   Calcium Carb-Cholecalciferol 600-800 MG-UNIT TABS, Take 1 tablet by mouth daily.   cycloSPORINE  (RESTASIS ) 0.05 % ophthalmic emulsion, Place 1 drop into both eyes 2 (two) times daily.   dimenhyDRINATE (DRAMAMINE) 50 MG tablet, Take 50 mg by mouth in the morning and at bedtime.   ELDERBERRY PO,  Take 1 tablet by mouth daily.   fluconazole  (DIFLUCAN ) 150 MG tablet, Take 1 tablet (150 mg total) by mouth daily.   fluorouracil  (EFUDEX ) 5 % cream, Apply a small amount to skin every night X2 weeks for nose   MELATONIN PO, Take 1 tablet by mouth at bedtime.   Multiple Vitamin (MULTIVITAMIN) tablet, Take 1 tablet by mouth daily.   Omega-3 Fatty Acids (FISH OIL PO), Take 1 capsule by mouth daily.   RSV vaccine recomb adjuvanted (AREXVY ) 120 MCG/0.5ML injection, as directed Intramuscular once   tretinoin  (RETIN-A ) 0.05 % cream, Apply 1 application on the skin NIGHTLY  Wheat Dextrin (BENEFIBER PO), Take 1 packet by mouth daily.   Reviewed prior external information including notes and imaging from  primary care provider As well as notes that were available from care everywhere and other healthcare systems.  Past medical history, social, surgical and family history all reviewed in electronic medical record.  No pertanent information unless stated regarding to the chief complaint.   Review of Systems:  No headache, visual changes, nausea, vomiting, diarrhea, constipation, dizziness, abdominal pain, skin rash, fevers, chills, night sweats, weight loss, swollen lymph nodes, body aches, joint swelling, chest pain, shortness of breath, mood changes. POSITIVE muscle aches  Objective  There were no vitals taken for this visit.   General: No apparent distress alert and oriented x3 mood and affect normal, dressed appropriately.  HEENT: Pupils equal, extraocular movements intact  Respiratory: Patient's speak in full sentences and does not appear short of breath  Cardiovascular: No lower extremity edema, non tender, no erythema      Impression and Recommendations:

## 2024-07-13 ENCOUNTER — Other Ambulatory Visit: Payer: Self-pay

## 2024-07-13 ENCOUNTER — Other Ambulatory Visit (HOSPITAL_BASED_OUTPATIENT_CLINIC_OR_DEPARTMENT_OTHER): Payer: Self-pay

## 2024-07-13 ENCOUNTER — Ambulatory Visit: Admitting: Family Medicine

## 2024-07-13 ENCOUNTER — Encounter: Payer: Self-pay | Admitting: Family Medicine

## 2024-07-13 VITALS — BP 128/82 | HR 69 | Ht 64.5 in

## 2024-07-13 DIAGNOSIS — M9908 Segmental and somatic dysfunction of rib cage: Secondary | ICD-10-CM | POA: Diagnosis not present

## 2024-07-13 DIAGNOSIS — M9902 Segmental and somatic dysfunction of thoracic region: Secondary | ICD-10-CM

## 2024-07-13 DIAGNOSIS — M79671 Pain in right foot: Secondary | ICD-10-CM

## 2024-07-13 DIAGNOSIS — G8929 Other chronic pain: Secondary | ICD-10-CM

## 2024-07-13 DIAGNOSIS — M9903 Segmental and somatic dysfunction of lumbar region: Secondary | ICD-10-CM

## 2024-07-13 DIAGNOSIS — M79641 Pain in right hand: Secondary | ICD-10-CM | POA: Diagnosis not present

## 2024-07-13 DIAGNOSIS — M545 Low back pain, unspecified: Secondary | ICD-10-CM

## 2024-07-13 DIAGNOSIS — M9901 Segmental and somatic dysfunction of cervical region: Secondary | ICD-10-CM | POA: Diagnosis not present

## 2024-07-13 DIAGNOSIS — M9904 Segmental and somatic dysfunction of sacral region: Secondary | ICD-10-CM

## 2024-07-13 DIAGNOSIS — M79642 Pain in left hand: Secondary | ICD-10-CM | POA: Diagnosis not present

## 2024-07-13 MED ORDER — PREDNISONE 20 MG PO TABS
40.0000 mg | ORAL_TABLET | Freq: Every day | ORAL | 0 refills | Status: AC
  Filled 2024-07-13: qty 10, 5d supply, fill #0

## 2024-07-13 NOTE — Patient Instructions (Addendum)
 Transverse arch ex Prednisone  40 mg for 5 days You are awesome Cataract 1 on [place]  [place] on [place]

## 2024-07-13 NOTE — Assessment & Plan Note (Signed)
 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  Prednisone  given.  I discussed proper shoes, versus adding a metatarsal pad to an orthotic.  Increase activity slowly.  Follow-up again in 6 to 12 weeks

## 2024-07-13 NOTE — Assessment & Plan Note (Signed)
 Multifactorial, home amiodarone infusion injection at this time.  Discussed regimen of home exercises, discussed which activities to do and which ones to avoid.  Increase activity slowly.  Discussed icing regimen.  Follow-up in 6 to 12 weeks.

## 2024-07-20 ENCOUNTER — Ambulatory Visit: Admitting: Family Medicine

## 2024-09-13 ENCOUNTER — Ambulatory Visit: Admitting: Family Medicine
